# Patient Record
Sex: Male | Born: 1935
Health system: Southern US, Community
[De-identification: ages and names within clinical notes are randomized; demographics above are authoritative.]

## PROBLEM LIST (undated history)

## (undated) DIAGNOSIS — I10 Essential (primary) hypertension: Secondary | ICD-10-CM

## (undated) DIAGNOSIS — K219 Gastro-esophageal reflux disease without esophagitis: Secondary | ICD-10-CM

## (undated) DIAGNOSIS — R946 Abnormal results of thyroid function studies: Secondary | ICD-10-CM

## (undated) DIAGNOSIS — J301 Allergic rhinitis due to pollen: Secondary | ICD-10-CM

## (undated) DIAGNOSIS — R609 Edema, unspecified: Secondary | ICD-10-CM

## (undated) DIAGNOSIS — K8681 Exocrine pancreatic insufficiency: Secondary | ICD-10-CM

## (undated) DIAGNOSIS — Z86010 Personal history of colonic polyps: Secondary | ICD-10-CM

## (undated) DIAGNOSIS — C189 Malignant neoplasm of colon, unspecified: Secondary | ICD-10-CM

## (undated) DIAGNOSIS — K8020 Calculus of gallbladder without cholecystitis without obstruction: Secondary | ICD-10-CM

## (undated) DIAGNOSIS — J309 Allergic rhinitis, unspecified: Secondary | ICD-10-CM

## (undated) DIAGNOSIS — M47817 Spondylosis without myelopathy or radiculopathy, lumbosacral region: Secondary | ICD-10-CM

## (undated) DIAGNOSIS — E119 Type 2 diabetes mellitus without complications: Secondary | ICD-10-CM

## (undated) HISTORY — PX: LEG SURGERY: SHX1003

## (undated) HISTORY — PX: CHOLECYSTECTOMY: SHX55

## (undated) HISTORY — DX: Type 2 diabetes mellitus without complications: E11.9

## (undated) HISTORY — DX: Spondylosis without myelopathy or radiculopathy, lumbosacral region: M47.817

## (undated) HISTORY — DX: Allergic rhinitis, unspecified: J30.9

## (undated) HISTORY — DX: Malignant neoplasm of colon, unspecified: C18.9

## (undated) HISTORY — DX: Exocrine pancreatic insufficiency: K86.81

## (undated) HISTORY — DX: Essential (primary) hypertension: I10

## (undated) HISTORY — DX: Gastro-esophageal reflux disease without esophagitis: K21.9

## (undated) HISTORY — DX: Allergic rhinitis due to pollen: J30.1

## (undated) HISTORY — DX: Calculus of gallbladder without cholecystitis without obstruction: K80.20

## (undated) HISTORY — PX: COLON SURGERY: SHX602

## (undated) HISTORY — DX: Abnormal results of thyroid function studies: R94.6

## (undated) HISTORY — PX: ANKLE SURGERY: SHX546

## (undated) HISTORY — PX: CATARACT EXTRACTION: SUR2

## (undated) HISTORY — DX: Edema, unspecified: R60.9

## (undated) HISTORY — DX: Personal history of colonic polyps: Z86.010

---

## 2005-08-23 ENCOUNTER — Ambulatory Visit: Payer: Self-pay | Admitting: Internal Medicine

## 2005-12-07 ENCOUNTER — Ambulatory Visit: Payer: Self-pay | Admitting: Gastroenterology

## 2005-12-19 ENCOUNTER — Ambulatory Visit: Payer: Self-pay | Admitting: Gastroenterology

## 2008-06-25 ENCOUNTER — Encounter: Payer: Self-pay | Admitting: Family Medicine

## 2008-12-01 ENCOUNTER — Ambulatory Visit: Payer: Self-pay | Admitting: Family Medicine

## 2008-12-01 DIAGNOSIS — I10 Essential (primary) hypertension: Secondary | ICD-10-CM

## 2008-12-01 DIAGNOSIS — Z860100 Personal history of colon polyps, unspecified: Secondary | ICD-10-CM | POA: Insufficient documentation

## 2008-12-01 DIAGNOSIS — K219 Gastro-esophageal reflux disease without esophagitis: Secondary | ICD-10-CM

## 2008-12-01 DIAGNOSIS — E119 Type 2 diabetes mellitus without complications: Secondary | ICD-10-CM

## 2008-12-01 DIAGNOSIS — J309 Allergic rhinitis, unspecified: Secondary | ICD-10-CM

## 2008-12-01 DIAGNOSIS — M47817 Spondylosis without myelopathy or radiculopathy, lumbosacral region: Secondary | ICD-10-CM

## 2008-12-01 HISTORY — DX: Allergic rhinitis, unspecified: J30.9

## 2008-12-01 HISTORY — DX: Spondylosis without myelopathy or radiculopathy, lumbosacral region: M47.817

## 2008-12-01 HISTORY — DX: Essential (primary) hypertension: I10

## 2008-12-01 HISTORY — DX: Gastro-esophageal reflux disease without esophagitis: K21.9

## 2008-12-01 HISTORY — DX: Personal history of colon polyps, unspecified: Z86.0100

## 2008-12-01 HISTORY — DX: Personal history of colonic polyps: Z86.010

## 2008-12-01 HISTORY — DX: Type 2 diabetes mellitus without complications: E11.9

## 2008-12-08 ENCOUNTER — Telehealth: Payer: Self-pay | Admitting: Gastroenterology

## 2008-12-08 ENCOUNTER — Telehealth: Payer: Self-pay | Admitting: Family Medicine

## 2009-01-13 ENCOUNTER — Ambulatory Visit: Payer: Self-pay | Admitting: Family Medicine

## 2009-01-15 LAB — CONVERTED CEMR LAB: Hgb A1c MFr Bld: 6.2 % (ref 4.6–6.5)

## 2009-01-25 ENCOUNTER — Ambulatory Visit: Payer: Self-pay | Admitting: Gastroenterology

## 2009-01-28 LAB — CONVERTED CEMR LAB
ALT: 26 units/L (ref 0–53)
Basophils Absolute: 0 10*3/uL (ref 0.0–0.1)
CO2: 30 meq/L (ref 19–32)
Calcium: 8.8 mg/dL (ref 8.4–10.5)
Chloride: 112 meq/L (ref 96–112)
Eosinophils Relative: 3.2 % (ref 0.0–5.0)
GFR calc non Af Amer: 51.57 mL/min (ref >60)
Hemoglobin: 12.9 g/dL — ABNORMAL LOW (ref 13.0–17.0)
Lymphocytes Relative: 26 % (ref 12.0–46.0)
Monocytes Relative: 10.2 % (ref 3.0–12.0)
Neutro Abs: 4.1 10*3/uL (ref 1.4–7.7)
Platelets: 177 10*3/uL (ref 150.0–400.0)
RDW: 14.1 % (ref 11.5–14.6)
Sodium: 145 meq/L (ref 135–145)
Total Protein: 6.8 g/dL (ref 6.0–8.3)
WBC: 6.8 10*3/uL (ref 4.5–10.5)

## 2009-02-02 ENCOUNTER — Ambulatory Visit: Payer: Self-pay | Admitting: Gastroenterology

## 2009-02-02 LAB — CONVERTED CEMR LAB
Fecal Occult Blood: NEGATIVE
OCCULT 5: NEGATIVE

## 2009-02-03 LAB — CONVERTED CEMR LAB
OCCULT 1: NEGATIVE
OCCULT 2: NEGATIVE
OCCULT 3: NEGATIVE

## 2009-02-25 LAB — HM COLONOSCOPY

## 2009-02-26 ENCOUNTER — Ambulatory Visit: Payer: Self-pay | Admitting: Gastroenterology

## 2009-02-26 DIAGNOSIS — R197 Diarrhea, unspecified: Secondary | ICD-10-CM

## 2009-03-01 ENCOUNTER — Encounter: Payer: Self-pay | Admitting: Gastroenterology

## 2009-03-02 ENCOUNTER — Telehealth (INDEPENDENT_AMBULATORY_CARE_PROVIDER_SITE_OTHER): Payer: Self-pay | Admitting: *Deleted

## 2009-03-05 ENCOUNTER — Ambulatory Visit: Payer: Self-pay | Admitting: Family Medicine

## 2009-03-09 ENCOUNTER — Ambulatory Visit: Payer: Self-pay | Admitting: Gastroenterology

## 2009-03-09 ENCOUNTER — Encounter: Payer: Self-pay | Admitting: Gastroenterology

## 2009-03-10 ENCOUNTER — Encounter: Payer: Self-pay | Admitting: Gastroenterology

## 2009-03-15 ENCOUNTER — Telehealth: Payer: Self-pay | Admitting: Family Medicine

## 2009-03-16 ENCOUNTER — Telehealth: Payer: Self-pay | Admitting: Gastroenterology

## 2009-03-24 ENCOUNTER — Telehealth: Payer: Self-pay | Admitting: Gastroenterology

## 2009-04-01 ENCOUNTER — Telehealth (INDEPENDENT_AMBULATORY_CARE_PROVIDER_SITE_OTHER): Payer: Self-pay | Admitting: *Deleted

## 2009-04-02 ENCOUNTER — Ambulatory Visit: Payer: Self-pay | Admitting: Family Medicine

## 2009-04-02 ENCOUNTER — Ambulatory Visit: Payer: Self-pay | Admitting: Gastroenterology

## 2009-04-06 ENCOUNTER — Encounter: Payer: Self-pay | Admitting: Family Medicine

## 2009-04-07 ENCOUNTER — Encounter: Admission: RE | Admit: 2009-04-07 | Discharge: 2009-04-07 | Payer: Self-pay | Admitting: Family Medicine

## 2009-04-07 ENCOUNTER — Ambulatory Visit: Payer: Self-pay | Admitting: Family Medicine

## 2009-04-07 DIAGNOSIS — K8020 Calculus of gallbladder without cholecystitis without obstruction: Secondary | ICD-10-CM

## 2009-04-07 DIAGNOSIS — R1011 Right upper quadrant pain: Secondary | ICD-10-CM | POA: Insufficient documentation

## 2009-04-07 HISTORY — DX: Calculus of gallbladder without cholecystitis without obstruction: K80.20

## 2009-04-07 LAB — CONVERTED CEMR LAB
Albumin: 3.2 g/dL — ABNORMAL LOW (ref 3.5–5.2)
Alkaline Phosphatase: 108 units/L (ref 39–117)
Basophils Relative: 0.1 % (ref 0.0–3.0)
Eosinophils Absolute: 0 10*3/uL (ref 0.0–0.7)
Hemoglobin: 12.3 g/dL — ABNORMAL LOW (ref 13.0–17.0)
Lymphocytes Relative: 10.4 % — ABNORMAL LOW (ref 12.0–46.0)
MCHC: 35 g/dL (ref 30.0–36.0)
MCV: 94.2 fL (ref 78.0–100.0)
Neutro Abs: 7.1 10*3/uL (ref 1.4–7.7)
RBC: 3.75 M/uL — ABNORMAL LOW (ref 4.22–5.81)
Total Protein: 7.3 g/dL (ref 6.0–8.3)

## 2009-04-08 ENCOUNTER — Telehealth: Payer: Self-pay | Admitting: Family Medicine

## 2009-04-14 ENCOUNTER — Telehealth (INDEPENDENT_AMBULATORY_CARE_PROVIDER_SITE_OTHER): Payer: Self-pay | Admitting: *Deleted

## 2009-04-16 ENCOUNTER — Telehealth: Payer: Self-pay | Admitting: Gastroenterology

## 2009-04-29 ENCOUNTER — Encounter (INDEPENDENT_AMBULATORY_CARE_PROVIDER_SITE_OTHER): Payer: Self-pay | Admitting: Surgery

## 2009-04-29 ENCOUNTER — Ambulatory Visit (HOSPITAL_COMMUNITY): Admission: RE | Admit: 2009-04-29 | Discharge: 2009-04-30 | Payer: Self-pay | Admitting: Surgery

## 2009-05-04 ENCOUNTER — Ambulatory Visit: Payer: Self-pay | Admitting: Family Medicine

## 2009-05-04 DIAGNOSIS — R609 Edema, unspecified: Secondary | ICD-10-CM

## 2009-05-04 HISTORY — DX: Edema, unspecified: R60.9

## 2009-05-07 ENCOUNTER — Encounter (INDEPENDENT_AMBULATORY_CARE_PROVIDER_SITE_OTHER): Payer: Self-pay | Admitting: Surgery

## 2009-05-07 ENCOUNTER — Ambulatory Visit: Admission: RE | Admit: 2009-05-07 | Discharge: 2009-05-07 | Payer: Self-pay | Admitting: Surgery

## 2009-05-07 ENCOUNTER — Ambulatory Visit: Payer: Self-pay | Admitting: Vascular Surgery

## 2009-06-22 ENCOUNTER — Ambulatory Visit: Payer: Self-pay | Admitting: Family Medicine

## 2009-06-23 ENCOUNTER — Telehealth (INDEPENDENT_AMBULATORY_CARE_PROVIDER_SITE_OTHER): Payer: Self-pay | Admitting: *Deleted

## 2009-06-23 LAB — CONVERTED CEMR LAB
BUN: 21 mg/dL (ref 6–23)
Calcium: 8.4 mg/dL (ref 8.4–10.5)
Creatinine, Ser: 1.4 mg/dL (ref 0.4–1.5)
GFR calc non Af Amer: 51.52 mL/min (ref >60)
Potassium: 4.5 meq/L (ref 3.5–5.1)

## 2009-06-25 ENCOUNTER — Encounter: Payer: Self-pay | Admitting: Family Medicine

## 2009-06-30 ENCOUNTER — Encounter: Admission: RE | Admit: 2009-06-30 | Discharge: 2009-06-30 | Payer: Self-pay | Admitting: Gastroenterology

## 2009-07-22 ENCOUNTER — Encounter (INDEPENDENT_AMBULATORY_CARE_PROVIDER_SITE_OTHER): Payer: Self-pay | Admitting: *Deleted

## 2009-10-19 ENCOUNTER — Encounter: Payer: Self-pay | Admitting: Family Medicine

## 2009-10-27 ENCOUNTER — Ambulatory Visit: Payer: Self-pay | Admitting: Family Medicine

## 2009-10-28 LAB — CONVERTED CEMR LAB
ALT: 17 units/L (ref 0–53)
AST: 21 units/L (ref 0–37)
Alkaline Phosphatase: 63 units/L (ref 39–117)
Bilirubin, Direct: 0.2 mg/dL (ref 0.0–0.3)
Cholesterol: 128 mg/dL (ref 0–200)
Total Protein: 7.3 g/dL (ref 6.0–8.3)
VLDL: 14.8 mg/dL (ref 0.0–40.0)

## 2009-12-23 ENCOUNTER — Encounter: Payer: Self-pay | Admitting: Family Medicine

## 2010-01-17 ENCOUNTER — Ambulatory Visit: Payer: Self-pay | Admitting: Family Medicine

## 2010-01-17 DIAGNOSIS — R946 Abnormal results of thyroid function studies: Secondary | ICD-10-CM | POA: Insufficient documentation

## 2010-01-17 DIAGNOSIS — R21 Rash and other nonspecific skin eruption: Secondary | ICD-10-CM

## 2010-01-17 HISTORY — DX: Abnormal results of thyroid function studies: R94.6

## 2010-01-27 ENCOUNTER — Telehealth: Payer: Self-pay | Admitting: Family Medicine

## 2010-04-13 ENCOUNTER — Ambulatory Visit: Payer: Self-pay | Admitting: Family Medicine

## 2010-04-14 LAB — CONVERTED CEMR LAB: Hgb A1c MFr Bld: 5.9 % (ref 4.6–6.5)

## 2010-06-12 ENCOUNTER — Encounter: Payer: Self-pay | Admitting: Family Medicine

## 2010-07-13 ENCOUNTER — Ambulatory Visit: Payer: Self-pay | Admitting: Family Medicine

## 2010-07-18 ENCOUNTER — Telehealth: Payer: Self-pay | Admitting: *Deleted

## 2010-07-20 LAB — CONVERTED CEMR LAB: Hgb A1c MFr Bld: 7 % — ABNORMAL HIGH (ref 4.6–6.5)

## 2010-09-19 LAB — HM DIABETES FOOT EXAM: HM Diabetic Foot Exam: NORMAL

## 2010-09-29 ENCOUNTER — Other Ambulatory Visit: Payer: Self-pay | Admitting: Family Medicine

## 2010-09-29 ENCOUNTER — Ambulatory Visit
Admission: RE | Admit: 2010-09-29 | Discharge: 2010-09-29 | Payer: Self-pay | Source: Home / Self Care | Attending: Family Medicine | Admitting: Family Medicine

## 2010-09-29 LAB — LIPID PANEL
Cholesterol: 132 mg/dL (ref 0–200)
HDL: 29.9 mg/dL — ABNORMAL LOW (ref >39.00)
LDL Cholesterol: 84 mg/dL (ref 0–99)
Total CHOL/HDL Ratio: 4
Triglycerides: 91 mg/dL (ref 0.0–149.0)
VLDL: 18.2 mg/dL (ref 0.0–40.0)

## 2010-09-29 LAB — HEMOGLOBIN A1C: Hgb A1c MFr Bld: 6.6 % — ABNORMAL HIGH (ref 4.6–6.5)

## 2010-10-11 ENCOUNTER — Telehealth: Payer: Self-pay | Admitting: Family Medicine

## 2010-10-18 NOTE — Assessment & Plan Note (Signed)
Summary: 3 mo rov/mm/pt rescd//ccm PT RSC/NJR   Vital Signs:  Patient profile:   77 year old male Weight:      229 pounds Temp:     98.6 degrees F oral BP sitting:   122 / 78  (left arm) Cuff size:   large  Vitals Entered By: Sid Falcon LPN (April 13, 2010 11:28 AM) CC: 3 month visit, Hypertension Management Is Patient Diabetic? Yes Did you bring your meter with you today? No   History of Present Illness: Patient is seen for followup diabetes and hypothyroidism.  Refer to prior note. Actually had labs through [facility] with thyroid upper limit of normal. He denies any fatigue issues. Has had some weight gain.  Type 2 diabetes which has been very well controlled. We have considered whether he may be able to discontinue Januvia.   Needs repeat A1c today. Compliant with other medications. No hypoglycemia. Not exercising regularly. Has scaled back carbohydrate intake.  Weight gain since going on pancreatic enz replacement.  Prior to that had had months of diarrhea.  No diarrhea at this time.  Hypertension History:      He denies headache, chest pain, palpitations, dyspnea with exertion, orthopnea, PND, peripheral edema, visual symptoms, neurologic problems, syncope, and side effects from treatment.        Positive major cardiovascular risk factors include male age 35 years old or older, diabetes, and hypertension.  Negative major cardiovascular risk factors include non-tobacco-user status.     Allergies: 1)  ! Amoxicillin (Amoxicillin)  Past History:  Past Medical History: Last updated: 01/25/2009 Allergies, Hay fever diabetes mellitus, type II GERD Hypertension Colon cancer 1990s, treated with segmental resection and adjuvant chemotherapy  Past Surgical History: Last updated: 01/25/2009 segmental colon resection for colon cancer 1990s  Family History: Last updated: 01/25/2009 Family History of Colon CA 1st degree relative <60 Family History Diabetes 1st degree  relative Stroke  Social History: Last updated: 01/25/2009 Occupation:  Midwife Single Alcohol use-no Former Smoker 2 sons  Risk Factors: Exercise: no (01/17/2010)  Risk Factors: Smoking Status: quit (12/01/2008) PMH-FH-SH reviewed for relevance  Review of Systems      See HPI  Physical Exam  General:  Well-developed,well-nourished,in no acute distress; alert,appropriate and cooperative throughout examination Ears:  External ear exam shows no significant lesions or deformities.  Otoscopic examination reveals clear canals, tympanic membranes are intact bilaterally without bulging, retraction, inflammation or discharge. Hearing is grossly normal bilaterally. Mouth:  Oral mucosa and oropharynx without lesions or exudates.  Teeth in good repair. Neck:  No deformities, masses, or tenderness noted. Lungs:  Normal respiratory effort, chest expands symmetrically. Lungs are clear to auscultation, no crackles or wheezes. Heart:  Normal rate and regular rhythm. S1 and S2 normal without gallop, murmur, click, rub or other extra sounds. Extremities:  no pitting edema. No lesions Neurologic:  alert & oriented X3 and cranial nerves II-XII intact.   Skin:  no rashes and no suspicious lesions.     Impression & Recommendations:  Problem # 1:  ABNORMAL THYROID FUNCTION TESTS (ICD-794.5) repeat TSH Orders: TLB-TSH (Thyroid Stimulating Hormone) (84443-TSH) Venipuncture (16109) Specimen Handling (60454)  Problem # 2:  DIABETES MELLITUS, TYPE II (ICD-250.00) has been controlled.  repeat A1C and consider d/c Januvia if excellent control His updated medication list for this problem includes:    Benazepril Hcl 20 Mg Tabs (Benazepril hcl) ..... Once daily    Januvia 100 Mg Tabs (Sitagliptin phosphate) ..... Once daily    Actos 30  Mg Tabs (Pioglitazone hcl) ..... One half tablet daily.    Glyburide 2.5 Mg Tabs (Glyburide) ..... One by mouth once daily  Orders: TLB-A1C / Hgb A1C  (Glycohemoglobin) (83036-A1C) Venipuncture (62952) Specimen Handling (84132)  Problem # 3:  ESSENTIAL HYPERTENSION (ICD-401.9)  His updated medication list for this problem includes:    Benazepril Hcl 20 Mg Tabs (Benazepril hcl) ..... Once daily    Amlodipine Besylate 5 Mg Tabs (Amlodipine besylate) ..... Once daily    Hydrochlorothiazide 25 Mg Tabs (Hydrochlorothiazide) .Marland Kitchen... 1/2 tablet by mouth once daily  Complete Medication List: 1)  Benazepril Hcl 20 Mg Tabs (Benazepril hcl) .... Once daily 2)  Amlodipine Besylate 5 Mg Tabs (Amlodipine besylate) .... Once daily 3)  Januvia 100 Mg Tabs (Sitagliptin phosphate) .... Once daily 4)  Actos 30 Mg Tabs (Pioglitazone hcl) .... One half tablet daily. 5)  Celebrex 200 Mg Caps (Celecoxib) .... As needed 6)  Hydrochlorothiazide 25 Mg Tabs (Hydrochlorothiazide) .... 1/2 tablet by mouth once daily 7)  Creon 12000 Unit Cpep (Pancrelipase (lip-prot-amyl)) .... One tab before meals three times a day 8)  Glyburide 2.5 Mg Tabs (Glyburide) .... One by mouth once daily  Hypertension Assessment/Plan:      The patient's hypertensive risk group is category C: Target organ damage and/or diabetes.  His calculated 10 year risk of coronary heart disease is 18 %.  Today's blood pressure is 122/78.  His blood pressure goal is < 130/80.  Patient Instructions: 1)  It is important that you exercise reguarly at least 20 minutes 5 times a week. If you develop chest pain, have severe difficulty breathing, or feel very tired, stop exercising immediately and seek medical attention.  2)  You need to lose weight. Consider a lower calorie diet and regular exercise.  3)  Check your blood sugars regularly. If your readings are usually above:  or below 70 you should contact our office.  4)  It is important that your diabetic A1c level is checked every 3 months.  5)  See your eye doctor yearly to check for diabetic eye damage. 6)  Check your feet each night  for sore areas,  calluses or signs of infection.  7)  Please schedule a follow-up appointment in 3 months .  Prescriptions: JANUVIA 100 MG TABS (SITAGLIPTIN PHOSPHATE) once daily  #90 x 3   Entered and Authorized by:   Evelena Peat MD   Signed by:   Evelena Peat MD on 04/13/2010   Method used:   Electronically to        [facility]* (retail)             ,          Ph: [phone]       Fax: [phone]   RxID:   [id number] ACTOS 30 MG TABS (PIOGLITAZONE HCL) one half tablet daily.  #90 x 3   Entered and Authorized by:   Evelena Peat MD   Signed by:   Evelena Peat MD on 04/13/2010   Method used:   Electronically to        [facility]* (retail)             ,          Ph: [phone]       Fax: [phone]   RxID:   [id number]

## 2010-10-18 NOTE — Assessment & Plan Note (Signed)
Summary: 3 MONTH ROV/NJR   Vital Signs:  Patient profile:   77 year old male Weight:      230 pounds Temp:     98.0 degrees F oral BP sitting:   140 / 78  (left arm) Cuff size:   large  Vitals Entered By: Sid Falcon LPN (July 13, 2010 10:02 AM)  Serial Vital Signs/Assessments:  Time      Position  BP       Pulse  Resp  Temp     By                     138/68                         Evelena Peat MD   History of Present Illness: Patient seen for the following medical problems.  History of frequent allergic symptoms. Mostly clear nasal discharge. Frequent sneezing. Has tried  antihistamine such as Zyrtec in the past without relief. Is inquiring about possible allergist referral. Also has tried Flonase. Does not recall nasal antihistamines.  Patient presents with one-week history of cough. Body aches and nasal congestion. Went to urgent care Center last week and started Zithromax. No definite fever this time.  No flu vaccine yet.  Type 2 diabetes. Currently takes Actos 15 mg daily and glyburide 2.5 mg daily.  last A1c 5.9% and Januvia discontinued. Recent fasting blood sugar 140. No symptoms of hyperglycemia  Diabetes Management History:      The patient is an 77 years old male who comes in for evaluation of Type 2 Diabetes Mellitus.  He has not been enrolled in the "Diabetic Education Program".  He states understanding of dietary principles but he is not following the appropriate diet.  No sensory loss is reported.  Self foot exams are being performed.  He is checking home blood sugars.  He says that he is not exercising regularly.        Hypoglycemic symptoms are not occurring.  No hyperglycemic symptoms are reported.        There are no symptoms to suggest diabetic complications.  The following changes have been made to his treatment plan since last visit: medication changes.    Allergies: 1)  ! Amoxicillin (Amoxicillin)  Past History:  Past Medical History: Last  updated: 01/25/2009 Allergies, Hay fever diabetes mellitus, type II GERD Hypertension Colon cancer 1990s, treated with segmental resection and adjuvant chemotherapy  Past Surgical History: Last updated: 01/25/2009 segmental colon resection for colon cancer 1990s  Family History: Last updated: 01/25/2009 Family History of Colon CA 1st degree relative <60 Family History Diabetes 1st degree relative Stroke  Social History: Last updated: 01/25/2009 Occupation:  Midwife Single Alcohol use-no Former Smoker 2 sons  Risk Factors: Exercise: no (01/17/2010)  Risk Factors: Smoking Status: quit (12/01/2008) PMH-FH-SH reviewed for relevance  Review of Systems      See HPI  Physical Exam  General:  Well-developed,well-nourished,in no acute distress; alert,appropriate and cooperative throughout examination Eyes:  pupils equal, pupils round, and pupils reactive to light.   Ears:  cerumen in both canals Nose:  clear mucous bilaterally Mouth:  Oral mucosa and oropharynx without lesions or exudates.  Teeth in good repair. Neck:  No deformities, masses, or tenderness noted. Lungs:  Normal respiratory effort, chest expands symmetrically. Lungs are clear to auscultation, no crackles or wheezes. Heart:  normal rate and regular rhythm.   Extremities:  No clubbing, cyanosis, edema,  or deformity noted with normal full range of motion of all joints.    Diabetes Management Exam:    Foot Exam (with socks and/or shoes not present):       Sensory-Pinprick/Light touch:          Left medial foot (L-4): normal          Left dorsal foot (L-5): normal          Left lateral foot (S-1): normal          Right medial foot (L-4): normal          Right dorsal foot (L-5): normal          Right lateral foot (S-1): normal       Sensory-Monofilament:          Left foot: normal          Right foot: normal       Inspection:          Left foot: normal          Right foot: normal       Nails:           Left foot: normal          Right foot: normal    Eye Exam:       Eye Exam done elsewhere          Date: 07/21/2009          Results: normal          Done by: [facility]   Impression & Recommendations:  Problem # 1:  ALLERGIC RHINITIS (ICD-477.9) Assessment Deteriorated trial of Astelin His updated medication list for this problem includes:    Astelin 137 Mcg/spray Soln (Azelastine hcl) .Marland Kitchen... 1-2 sprays per nostril two times a day as needed  Problem # 2:  VIRAL URI (ICD-465.9) Assessment: New  His updated medication list for this problem includes:    Celebrex 200 Mg Caps (Celecoxib) .Marland Kitchen... As needed  Problem # 3:  DIABETES MELLITUS, TYPE II (ICD-250.00) repeat A1C.  Schedule eye exam. His updated medication list for this problem includes:    Benazepril Hcl 20 Mg Tabs (Benazepril hcl) ..... Once daily    Januvia 100 Mg Tabs (Sitagliptin phosphate) ..... Once daily    Actos 30 Mg Tabs (Pioglitazone hcl) ..... One half tablet daily.    Glyburide 2.5 Mg Tabs (Glyburide) ..... One by mouth once daily  Orders: Specimen Handling (54098) Venipuncture (11914) TLB-A1C / Hgb A1C (Glycohemoglobin) (83036-A1C)  Problem # 4:  ESSENTIAL HYPERTENSION (ICD-401.9)  His updated medication list for this problem includes:    Benazepril Hcl 20 Mg Tabs (Benazepril hcl) ..... Once daily    Amlodipine Besylate 5 Mg Tabs (Amlodipine besylate) ..... Once daily    Hydrochlorothiazide 25 Mg Tabs (Hydrochlorothiazide) .Marland Kitchen... 1/2 tablet by mouth once daily  Complete Medication List: 1)  Benazepril Hcl 20 Mg Tabs (Benazepril hcl) .... Once daily 2)  Amlodipine Besylate 5 Mg Tabs (Amlodipine besylate) .... Once daily 3)  Januvia 100 Mg Tabs (Sitagliptin phosphate) .... Once daily 4)  Actos 30 Mg Tabs (Pioglitazone hcl) .... One half tablet daily. 5)  Celebrex 200 Mg Caps (Celecoxib) .... As needed 6)  Hydrochlorothiazide 25 Mg Tabs (Hydrochlorothiazide) .... 1/2 tablet by mouth once daily 7)   Creon 12000 Unit Cpep (Pancrelipase (lip-prot-amyl)) .... One tab before meals three times a day 8)  Glyburide 2.5 Mg Tabs (Glyburide) .... One by mouth once daily 9)  Astelin  137 Mcg/spray Soln (Azelastine hcl) .Marland Kitchen.. 1-2 sprays per nostril two times a day as needed  Other Orders: Admin 1st Vaccine (08657) Flu Vaccine 73yrs + (84696)  Diabetes Management Assessment/Plan:      His blood pressure goal is < 130/80.    Patient Instructions: 1)  Please schedule a follow-up appointment in 3 months .  2)  Limit your Sodium(salt) .  3)  Check your  Blood Pressure regularly . If it is above: 140/90  you should make an appointment. 4)  See your eye doctor yearly to check for diabetic eye damage. Prescriptions: ASTELIN 137 MCG/SPRAY SOLN (AZELASTINE HCL) 1-2 sprays per nostril two times a day as needed  #1 x 3   Entered and Authorized by:   Evelena Peat MD   Signed by:   Evelena Peat MD on 07/13/2010   Method used:   Electronically to        [facility]. [phone]* (retail)       [phone]       [place], [place]  [address]       Ph: [phone]       Fax: [phone]   RxID:   [id number]    Orders Added: 1)  Admin 1st Vaccine [90471] 2)  Flu Vaccine 21yrs + [60630] 3)  Specimen Handling [99000] 4)  Venipuncture [36415] 5)  TLB-A1C / Hgb A1C (Glycohemoglobin) [83036-A1C] 6)  Est. Patient Level IV [16010]   Flu Vaccine Consent Questions     Do you have a history of severe allergic reactions to this vaccine? no    Any prior history of allergic reactions to egg and/or gelatin? no    Do you have a sensitivity to the preservative Thimersol? no    Do you have a past history of Guillan-Barre Syndrome? no    Do you currently have an acute febrile illness? no    Have you ever had a severe reaction to latex? no    Vaccine information given and explained to patient? yes    Are you currently pregnant? no    Lot Number:[id number]   Exp  Date:03/18/2011   Site Given  Left Deltoid IM .lbflu1

## 2010-10-18 NOTE — Progress Notes (Signed)
Summary: no answer no machine 10-31, A1C   Phone Note Call from Patient Call back at Home Phone [phone]   Caller: vm Reason for Call: Lab or Test Results Summary of Call: A1C Initial call taken by: Rudy Jew, RN,  July 18, 2010 12:35 PM  Follow-up for Phone Call        No answer.  No message machine.   Follow-up by: Rudy Jew, RN,  July 18, 2010 2:16 PM  Additional Follow-up for Phone Call Additional follow up Details #1::        Pt informed, he is not happy with A1C at 7.  He would like to go back on Januvia.  Per Dr Caryl Never, he would prefer he work on controling his weight and diet, however may go back on [place] if he wishes.  Pt informed, he will work on weight loss, diet Additional Follow-up by: Sid Falcon LPN,  July 20, 2010 10:41 AM    Additional Follow-up for Phone Call Additional follow up Details #2::    We can go back on Januvia but my prefence is that he focus on dietary compliance and weight loss as first priority.  A1C of 7 is considered adequate control by ADA guidelines. Follow-up by: Evelena Peat MD,  July 20, 2010 10:45 AM

## 2010-10-18 NOTE — Assessment & Plan Note (Signed)
Summary: FU ON DM/NJR   Vital Signs:  Patient profile:   77 year old male Weight:      229 pounds Temp:     98.5 degrees F oral BP sitting:   130 / 70  (left arm) Cuff size:   large  Vitals Entered By: Sid Falcon LPN (October 27, 2009 10:09 AM) CC: 3 month DM follow-up, Hypertension Management Is Patient Diabetic? Yes Did you bring your meter with you today? No   History of Present Illness: Followup medical problems. History of type 2 diabetes which has been well controlled home readings. Most fasting blood sugars less than 100. No hypoglycemia. Evening blood sugars 100-140. Gets eye exams through the [facility]. Last eye exam last October.  Patient had some chronic diarrhea and extensive workup. Eventually placed on Creon and symptoms have all but resolved. Has had significant weight gain and feels much better overall. Also taking vitamin D supplement he thinks that may correlate with feeling better.  Hypertension History:      He denies headache, chest pain, palpitations, dyspnea with exertion, orthopnea, PND, visual symptoms, neurologic problems, syncope, and side effects from treatment.  He notes no problems with any antihypertensive medication side effects.  Further comments include: mild peripheral edema which is unchanged.        Positive major cardiovascular risk factors include male age 46 years old or older, diabetes, and hypertension.  Negative major cardiovascular risk factors include non-tobacco-user status.      Allergies: 1)  ! Amoxicillin (Amoxicillin)  Past History:  Past Medical History: Last updated: 01/25/2009 Allergies, Hay fever diabetes mellitus, type II GERD Hypertension Colon cancer 1990s, treated with segmental resection and adjuvant chemotherapy  Past Surgical History: Last updated: 01/25/2009 segmental colon resection for colon cancer 1990s  Social History: Last updated: 01/25/2009 Occupation:  Midwife Single Alcohol  use-no Former Smoker 2 sons PMH-FH-SH reviewed for relevance  Review of Systems       The patient complains of weight gain.  The patient denies anorexia, fever, weight loss, vision loss, chest pain, syncope, dyspnea on exertion, prolonged cough, headaches, hemoptysis, abdominal pain, melena, hematochezia, severe indigestion/heartburn, and incontinence.    Physical Exam  General:  Well-developed,well-nourished,in no acute distress; alert,appropriate and cooperative throughout examination Eyes:  pupils equal, pupils round, and pupils reactive to light.   Ears:  External ear exam shows no significant lesions or deformities.  Otoscopic examination reveals clear canals, tympanic membranes are intact bilaterally without bulging, retraction, inflammation or discharge. Hearing is grossly normal bilaterally. Mouth:  Oral mucosa and oropharynx without lesions or exudates.  Teeth in good repair. Neck:  No deformities, masses, or tenderness noted. Lungs:  Normal respiratory effort, chest expands symmetrically. Lungs are clear to auscultation, no crackles or wheezes. Heart:  normal rate and regular rhythm.   Extremities:  trace peripheral edema nonpitting lower legs  Diabetes Management Exam:    Foot Exam (with socks and/or shoes not present):       Sensory-Pinprick/Light touch:          Left medial foot (L-4): normal          Left dorsal foot (L-5): normal          Left lateral foot (S-1): normal          Right medial foot (L-4): normal          Right dorsal foot (L-5): normal          Right lateral foot (S-1): normal  Sensory-Monofilament:          Left foot: normal          Right foot: normal       Inspection:          Left foot: normal          Right foot: normal       Nails:          Left foot: fungal infection          Right foot: fungal infection    Eye Exam:       Eye Exam done elsewhere          Date: 06/18/2009          Results: normal          [facility]   Impression & Recommendations:  Problem # 1:  DIABETES MELLITUS, TYPE II (ICD-250.00) good control by home readings His updated medication list for this problem includes:    Benazepril Hcl 20 Mg Tabs (Benazepril hcl) ..... Once daily    Januvia 100 Mg Tabs (Sitagliptin phosphate) ..... Once daily    Actos 30 Mg Tabs (Pioglitazone hcl) ..... One half tablet daily.    Glyburide 2.5 Mg Tabs (Glyburide) ..... One by mouth once daily  Orders: Venipuncture (60454) TLB-Lipid Panel (80061-LIPID) TLB-Hepatic/Liver Function Pnl (80076-HEPATIC) TLB-A1C / Hgb A1C (Glycohemoglobin) (83036-A1C) TLB-Microalbumin/Creat Ratio, Urine (82043-MALB)  Problem # 2:  ESSENTIAL HYPERTENSION (ICD-401.9)  His updated medication list for this problem includes:    Benazepril Hcl 20 Mg Tabs (Benazepril hcl) ..... Once daily    Amlodipine Besylate 5 Mg Tabs (Amlodipine besylate) ..... Once daily    Hydrochlorothiazide 25 Mg Tabs (Hydrochlorothiazide) .Marland Kitchen... 1/2 tablet by mouth once daily  Problem # 3:  DIARRHEA (ICD-787.91) Assessment: Improved  Complete Medication List: 1)  Benazepril Hcl 20 Mg Tabs (Benazepril hcl) .... Once daily 2)  Amlodipine Besylate 5 Mg Tabs (Amlodipine besylate) .... Once daily 3)  Januvia 100 Mg Tabs (Sitagliptin phosphate) .... Once daily 4)  Actos 30 Mg Tabs (Pioglitazone hcl) .... One half tablet daily. 5)  Celebrex 200 Mg Caps (Celecoxib) .... As needed 6)  Hydrochlorothiazide 25 Mg Tabs (Hydrochlorothiazide) .... 1/2 tablet by mouth once daily 7)  Triamcinolone Acetonide 0.1 % Crea (Triamcinolone acetonide) .... Apply to affected rash two times a day as needed 8)  Creon 12000 Unit Cpep (Pancrelipase (lip-prot-amyl)) .... One tab before meals three times a day 9)  Glyburide 2.5 Mg Tabs (Glyburide) .... One by mouth once daily  Diabetes Management Assessment/Plan:      His blood pressure goal is < 130/80.    Hypertension Assessment/Plan:      The patient's hypertensive  risk group is category C: Target organ damage and/or diabetes.  Today's blood pressure is 130/70.  His blood pressure goal is < 130/80.  Patient Instructions: 1)  Please schedule a follow-up appointment in 4 months .  2)  It is important that you exercise reguarly at least 20 minutes 5 times a week. If you develop chest pain, have severe difficulty breathing, or feel very tired, stop exercising immediately and seek medical attention.  3)  You need to lose weight. Consider a lower calorie diet and regular exercise.  4)  Check your blood sugars regularly. If your readings are usually above: 140 or below 70 you should contact our office.  5)  It is important that your diabetic A1c level is checked every 3 months.  6)  See your eye doctor yearly to check for diabetic eye damage. 7)  Check your feet each night  for sore areas, calluses or signs of infection.  8)  Start baby Apirin 81 mg one daily.

## 2010-10-18 NOTE — Progress Notes (Signed)
Summary: refill HCTZ 30 days  Phone Note Call from Patient   Caller: Patient Call For: Evelena Peat MD Summary of Call: [facility] Needs 30 days of HCTZ 25 mg. sent to [facility] while he is waiting for his shipment from [place]. Initial call taken by: Lynann Beaver CMA,  Jan 27, 2010 11:25 AM    Prescriptions: HYDROCHLOROTHIAZIDE 25 MG TABS (HYDROCHLOROTHIAZIDE) 1/2 tablet by mouth once daily  #30 x 0   Entered by:   Sid Falcon LPN   Authorized by:   Evelena Peat MD   Signed by:   Sid Falcon LPN on 54/05/8118   Method used:   Telephoned to ...       [facility]. [phone]* (retail)       [phone] [address]       [place], [place]  [address]       Ph: [phone]       Fax: [phone]   RxID:   [id number]

## 2010-10-18 NOTE — Assessment & Plan Note (Signed)
Summary: med check/cjr   Vital Signs:  Patient profile:   77 year old male Weight:      228 pounds Temp:     98.7 degrees F oral BP sitting:   138 / 68  (left arm) Cuff size:   large  Vitals Entered By: Sid Falcon LPN (Jan 17, 1609 10:51 AM) CC: concerns with thyroid   History of Present Illness: Patient here for evaluation multiple items as follows.  Recent visit to the [facility] with TSH 4.99 with upper limit of normal 3.77 by their lab. Patient denies any excessive fatigue. Had some weight gain. No prior history of thyroid problems. Was not started on any medication. No constipation issues.  Type 2 diabetes which has been stable. Fasting blood sugars consistently below 100. No hypoglycemia. No symptoms of hyperglycemia. Eye exam next month   new problem of rash right hand between third and fourth digits.  that is slightly pruritic and occasionally slightly sore. No drainage. Has not used anything topically.  Patient had some chronic diarrhea which is currently stable. Followed by GI and treated with Creon one tablet 3 times daily with meals  Diabetes Management History:      The patient is an 77 years old male who comes in for evaluation of Type 2 Diabetes Mellitus.  He has not been enrolled in the "Diabetic Education Program".  He states understanding of dietary principles and is following his diet appropriately.  No sensory loss is reported.  Self foot exams are being performed.  He is checking home blood sugars.  He says that he is not exercising regularly.        Hypoglycemic symptoms are not occurring.  No hyperglycemic symptoms are reported.    Preventive Screening-Counseling & Management  Caffeine-Diet-Exercise     Does Patient Exercise: no  Allergies: 1)  ! Amoxicillin (Amoxicillin)  Past History:  Past Medical History: Last updated: 01/25/2009 Allergies, Hay fever diabetes mellitus, type II GERD Hypertension Colon cancer 1990s, treated with segmental  resection and adjuvant chemotherapy  Past Surgical History: Last updated: 01/25/2009 segmental colon resection for colon cancer 1990s  Social History: Last updated: 01/25/2009 Occupation:  Midwife Single Alcohol use-no Former Smoker 2 sons  Social History: Does Patient Exercise:  no  Review of Systems  The patient denies anorexia, fever, weight loss, chest pain, syncope, dyspnea on exertion, peripheral edema, prolonged cough, headaches, hemoptysis, abdominal pain, melena, hematochezia, severe indigestion/heartburn, hematuria, incontinence, and muscle weakness.    Physical Exam  General:  Well-developed,well-nourished,in no acute distress; alert,appropriate and cooperative throughout examination Head:  Normocephalic and atraumatic without obvious abnormalities. No apparent alopecia or balding. Mouth:  Oral mucosa and oropharynx without lesions or exudates.  Teeth in good repair. Neck:  No deformities, masses, or tenderness noted. Lungs:  Normal respiratory effort, chest expands symmetrically. Lungs are clear to auscultation, no crackles or wheezes. Heart:  Normal rate and regular rhythm. S1 and S2 normal without gallop, murmur, click, rub or other extra sounds. Extremities:  no edema  Diabetes Management Exam:    Foot Exam (with socks and/or shoes not present):       Sensory-Pinprick/Light touch:          Left medial foot (L-4): normal          Left dorsal foot (L-5): normal          Left lateral foot (S-1): normal          Right medial foot (L-4): normal  Right dorsal foot (L-5): normal          Right lateral foot (S-1): normal       Sensory-Monofilament:          Left foot: normal          Right foot: normal       Inspection:          Left foot: normal          Right foot: normal       Nails:          Left foot: normal          Right foot: normal    Eye Exam:       Eye Exam done elsewhere          Date: 01/18/2009          Results: normal          [facility]   Impression & Recommendations:  Problem # 1:  DIABETES MELLITUS, TYPE II (ICD-250.00) repeat A1C in 3 months and consider D/C Januvia at that time if excellent control. His updated medication list for this problem includes:    Benazepril Hcl 20 Mg Tabs (Benazepril hcl) ..... Once daily    Januvia 100 Mg Tabs (Sitagliptin phosphate) ..... Once daily    Actos 30 Mg Tabs (Pioglitazone hcl) ..... One half tablet daily.    Glyburide 2.5 Mg Tabs (Glyburide) ..... One by mouth once daily  Problem # 2:  ABNORMAL THYROID FUNCTION TESTS (ICD-794.5) repeat TSH in 3 months. ?early hypothyroidism.  Problem # 3:  SKIN RASH (ICD-782.1) interdigital.  ? fungal.  Try OTC Lamisil. The following medications were removed from the medication list:    Triamcinolone Acetonide 0.1 % Crea (Triamcinolone acetonide) .Marland Kitchen... Apply to affected rash two times a day as needed  Complete Medication List: 1)  Benazepril Hcl 20 Mg Tabs (Benazepril hcl) .... Once daily 2)  Amlodipine Besylate 5 Mg Tabs (Amlodipine besylate) .... Once daily 3)  Januvia 100 Mg Tabs (Sitagliptin phosphate) .... Once daily 4)  Actos 30 Mg Tabs (Pioglitazone hcl) .... One half tablet daily. 5)  Celebrex 200 Mg Caps (Celecoxib) .... As needed 6)  Hydrochlorothiazide 25 Mg Tabs (Hydrochlorothiazide) .... 1/2 tablet by mouth once daily 7)  Creon 12000 Unit Cpep (Pancrelipase (lip-prot-amyl)) .... One tab before meals three times a day 8)  Glyburide 2.5 Mg Tabs (Glyburide) .... One by mouth once daily  Diabetes Management Assessment/Plan:      His blood pressure goal is < 130/80.    Patient Instructions: 1)  Please schedule a follow-up appointment in 3 months .  2)  TSH prior to visit ICD-9 : 244.9 3)  HgBA1c prior to visit  ICD-9: 794.5 4)  Try over the counter Lamisil or Lotrimen cream to skin rash

## 2010-10-18 NOTE — Letter (Signed)
Summary: [facility]  [facility]   Imported By: Maryln Gottron 10/26/2009 13:31:47  _____________________________________________________________________  External Attachment:    Type:   Image     Comment:   External Document

## 2010-10-18 NOTE — Medication Information (Signed)
Summary: Order for Diabetic Testing Supplies  Order for Diabetic Testing Supplies   Imported By: Maryln Gottron 06/15/2010 10:44:12  _____________________________________________________________________  External Attachment:    Type:   Image     Comment:   External Document

## 2010-10-20 NOTE — Progress Notes (Signed)
Summary: REFILL REQUEST HCTZ to [facility]  Phone Note Refill Request Message from:  Patient on October 11, 2010 10:17 AM  Refills Requested: Medication #1:  HYDROCHLOROTHIAZIDE 25 MG TABS 1/2 tablet by mouth once daily   Notes: 90-day supply... Pt needs paper Rx to submit to mail order.... Pt can be reached at [phone] with any questions or concerns.    Initial call taken by: Debbra Riding,  October 11, 2010 10:18 AM    Prescriptions: HYDROCHLOROTHIAZIDE 25 MG TABS (HYDROCHLOROTHIAZIDE) 1/2 tablet by mouth once daily  #90 x 1   Entered by:   Sid Falcon LPN   Authorized by:   Evelena Peat MD   Signed by:   Sid Falcon LPN on 09/81/1914   Method used:   Electronically to        [facility]* (retail)             ,          Ph: [phone]       Fax: [phone]   RxID:   [id number]

## 2010-10-20 NOTE — Assessment & Plan Note (Signed)
Summary: 3 month rov./njr   Vital Signs:  Patient profile:   77 year old male Weight:      231 pounds Temp:     98.3 degrees F oral BP sitting:   130 / 72  (left arm) Cuff size:   large  Vitals Entered By: Sid Falcon LPN (September 29, 2010 10:57 AM)  History of Present Illness: Patient here for followup medical problems. He has hypertension, type 2 diabetes, GERD, history of pancreas insufficiency. Blood sugars have remained stable. No hypoglycemic symptoms. Needs refills of glyburide. Eye exams thru [facility]. All medications reviewed.  Exercising with exercise cycle and good tolerance.  Doing 20 min per day.  Diabetes Management History:      The patient is an 77 years old male who comes in for evaluation of Type 2 Diabetes Mellitus.  He has not been enrolled in the "Diabetic Education Program".  He is checking home blood sugars.  He says that he is not exercising regularly.        Hypoglycemic symptoms are not occurring.  No hyperglycemic symptoms are reported.    Hypertension History:      He denies headache, chest pain, palpitations, dyspnea with exertion, orthopnea, PND, peripheral edema, visual symptoms, neurologic problems, and syncope.  He notes no problems with any antihypertensive medication side effects.        Positive major cardiovascular risk factors include male age 55 years old or older, diabetes, and hypertension.  Negative major cardiovascular risk factors include non-tobacco-user status.     Allergies: 1)  ! Amoxicillin (Amoxicillin)  Past History:  Past Medical History: Last updated: 01/25/2009 Allergies, Hay fever diabetes mellitus, type II GERD Hypertension Colon cancer 1990s, treated with segmental resection and adjuvant chemotherapy  Past Surgical History: Last updated: 01/25/2009 segmental colon resection for colon cancer 1990s  Family History: Last updated: 01/25/2009 Family History of Colon CA 1st degree relative <60 Family History  Diabetes 1st degree relative Stroke  Social History: Last updated: 01/25/2009 Occupation:  Midwife Single Alcohol use-no Former Smoker 2 sons  Risk Factors: Exercise: no (01/17/2010)  Risk Factors: Smoking Status: quit (12/01/2008) PMH-FH-SH reviewed for relevance  Review of Systems       The patient complains of weight gain.  The patient denies chest pain, syncope, dyspnea on exertion, peripheral edema, prolonged cough, headaches, hemoptysis, abdominal pain, melena, hematochezia, and severe indigestion/heartburn.    Diabetes Management Exam:    Foot Exam (with socks and/or shoes not present):       Sensory-Pinprick/Light touch:          Left medial foot (L-4): normal          Left dorsal foot (L-5): normal          Left lateral foot (S-1): normal          Right medial foot (L-4): normal          Right dorsal foot (L-5): normal          Right lateral foot (S-1): normal       Sensory-Monofilament:          Left foot: normal          Right foot: normal       Inspection:          Left foot: normal          Right foot: normal       Nails:          Left foot:  fungal infection          Right foot: fungal infection    Eye Exam:       Eye Exam done elsewhere          Date: 11/18/2009          Results: normal          [facility]   Impression & Recommendations:  Problem # 1:  DIABETES MELLITUS, TYPE II (ICD-250.00)  His updated medication list for this problem includes:    Benazepril Hcl 20 Mg Tabs (Benazepril hcl) ..... Once daily    Januvia 100 Mg Tabs (Sitagliptin phosphate) ..... Once daily    Actos 30 Mg Tabs (Pioglitazone hcl) ..... One half tablet daily.    Glyburide 2.5 Mg Tabs (Glyburide) ..... One by mouth once daily  Orders: Venipuncture (16109) Specimen Handling (60454) TLB-Lipid Panel (80061-LIPID) TLB-A1C / Hgb A1C (Glycohemoglobin) (83036-A1C)  Problem # 2:  ESSENTIAL HYPERTENSION (ICD-401.9)  His updated medication list for this problem  includes:    Benazepril Hcl 20 Mg Tabs (Benazepril hcl) ..... Once daily    Amlodipine Besylate 5 Mg Tabs (Amlodipine besylate) ..... Once daily    Hydrochlorothiazide 25 Mg Tabs (Hydrochlorothiazide) .Marland Kitchen... 1/2 tablet by mouth once daily  Problem # 3:  GERD (ICD-530.81)  Complete Medication List: 1)  Benazepril Hcl 20 Mg Tabs (Benazepril hcl) .... Once daily 2)  Amlodipine Besylate 5 Mg Tabs (Amlodipine besylate) .... Once daily 3)  Januvia 100 Mg Tabs (Sitagliptin phosphate) .... Once daily 4)  Actos 30 Mg Tabs (Pioglitazone hcl) .... One half tablet daily. 5)  Celebrex 200 Mg Caps (Celecoxib) .... As needed 6)  Hydrochlorothiazide 25 Mg Tabs (Hydrochlorothiazide) .... 1/2 tablet by mouth once daily 7)  Creon 12000 Unit Cpep (Pancrelipase (lip-prot-amyl)) .... One tab before meals three times a day 8)  Glyburide 2.5 Mg Tabs (Glyburide) .... One by mouth once daily 9)  Astelin 137 Mcg/spray Soln (Azelastine hcl) .Marland Kitchen.. 1-2 sprays per nostril two times a day as needed  Diabetes Management Assessment/Plan:      His blood pressure goal is < 130/80.    Hypertension Assessment/Plan:      The patient's hypertensive risk group is category C: Target organ damage and/or diabetes.  His calculated 10 year risk of coronary heart disease is 22 %.  Today's blood pressure is 130/72.  His blood pressure goal is < 130/80.  Patient Instructions: 1)  Please schedule a follow-up appointment in 3 months .  2)  It is important that you exercise reguarly at least 20 minutes 5 times a week. If you develop chest pain, have severe difficulty breathing, or feel very tired, stop exercising immediately and seek medical attention.  3)  You need to lose weight. Consider a lower calorie diet and regular exercise.  Prescriptions: GLYBURIDE 2.5 MG TABS (GLYBURIDE) one by mouth once daily  #90 x 3   Entered and Authorized by:   Evelena Peat MD   Signed by:   Evelena Peat MD on 09/29/2010   Method used:   Print then  Give to Patient   RxID:   [id number]    Orders Added: 1)  Venipuncture [30865] 2)  Specimen Handling [99000] 3)  TLB-Lipid Panel [80061-LIPID] 4)  TLB-A1C / Hgb A1C (Glycohemoglobin) [83036-A1C] 5)  Est. Patient Level IV [78469]

## 2010-12-24 LAB — COMPREHENSIVE METABOLIC PANEL
ALT: 36 U/L (ref 0–53)
AST: 37 U/L (ref 0–37)
Alkaline Phosphatase: 76 U/L (ref 39–117)
CO2: 28 mEq/L (ref 19–32)
GFR calc non Af Amer: 51 mL/min — ABNORMAL LOW (ref >60)
Glucose, Bld: 99 mg/dL (ref 70–99)
Potassium: 4.3 mEq/L (ref 3.5–5.1)
Sodium: 142 mEq/L (ref 135–145)

## 2010-12-24 LAB — DIFFERENTIAL
Basophils Relative: 0 % (ref 0–1)
Eosinophils Absolute: 0.2 10*3/uL (ref 0.0–0.7)
Eosinophils Relative: 4 % (ref 0–5)
Monocytes Relative: 10 % (ref 3–12)
Neutrophils Relative %: 65 % (ref 43–77)

## 2010-12-24 LAB — GLUCOSE, CAPILLARY
Glucose-Capillary: 128 mg/dL — ABNORMAL HIGH (ref 70–99)
Glucose-Capillary: 138 mg/dL — ABNORMAL HIGH (ref 70–99)

## 2010-12-24 LAB — CBC
Hemoglobin: 11.9 g/dL — ABNORMAL LOW (ref 13.0–17.0)
RBC: 3.77 MIL/uL — ABNORMAL LOW (ref 4.22–5.81)
WBC: 6.3 10*3/uL (ref 4.0–10.5)

## 2010-12-24 LAB — HEMOGLOBIN A1C: Mean Plasma Glucose: 120 mg/dL

## 2010-12-26 LAB — GLUCOSE, CAPILLARY: Glucose-Capillary: 87 mg/dL (ref 70–99)

## 2010-12-27 ENCOUNTER — Encounter: Payer: Self-pay | Admitting: Family Medicine

## 2010-12-29 ENCOUNTER — Ambulatory Visit (INDEPENDENT_AMBULATORY_CARE_PROVIDER_SITE_OTHER): Payer: BC Managed Care – PPO | Admitting: Family Medicine

## 2010-12-29 ENCOUNTER — Encounter: Payer: Self-pay | Admitting: Family Medicine

## 2010-12-29 DIAGNOSIS — M47817 Spondylosis without myelopathy or radiculopathy, lumbosacral region: Secondary | ICD-10-CM

## 2010-12-29 DIAGNOSIS — I10 Essential (primary) hypertension: Secondary | ICD-10-CM

## 2010-12-29 DIAGNOSIS — E119 Type 2 diabetes mellitus without complications: Secondary | ICD-10-CM

## 2010-12-29 LAB — HEMOGLOBIN A1C: Hgb A1c MFr Bld: 6.8 % — ABNORMAL HIGH (ref 4.6–6.5)

## 2010-12-29 NOTE — Progress Notes (Signed)
  Subjective:    Patient ID: Steven Ward, male    DOB: 02-12-1925, 77 y.o.   MRN: [id number]  HPI Patient seen for followup. He has hypertension, type 2 diabetes, GERD, history of lumbar spondylosis, and pancreatic exocrine insufficiency on chronic enzyme therapy. Complains of some recent weight gain. Exercises but inconsistently. Denies any chest pain, dizziness, shortness of breath. Blood sugar stable run 100-120. A1c has been well controlled. No symptoms of hyper or hypoglycemia.  Patient complains of some progressive low back pain. Present for several months if not years. Achy quality. Moderate severity. Pain with sitting but also change of position and walking. Had chiropractic therapy couple years ago without much improvement. Had plain x-rays at that time. No radiculopathy symptoms. No incontinence. No weakness or numbness. Denies any fever, chills, appetite, or weight loss symptoms. No back injury.   Review of Systems  Constitutional: Negative for fever, chills, activity change and appetite change.  HENT: Negative for trouble swallowing.   Respiratory: Negative for cough, shortness of breath and wheezing.   Cardiovascular: Negative for chest pain, palpitations and leg swelling.  Gastrointestinal: Negative for nausea, vomiting, abdominal pain, diarrhea, blood in stool and abdominal distention.  Genitourinary: Negative for dysuria.  Musculoskeletal: Positive for back pain. Negative for myalgias, joint swelling and gait problem.  Neurological: Negative for dizziness, syncope and headaches.  Psychiatric/Behavioral: Negative for dysphoric mood.       Objective:   Physical Exam  Constitutional: He is oriented to person, place, and time. He appears well-developed and well-nourished.  Cardiovascular: Normal rate, regular rhythm and normal heart sounds.   Pulmonary/Chest: Effort normal and breath sounds normal. No respiratory distress. He has no wheezes. He has no rales.  Abdominal:  Soft. Bowel sounds are normal. He exhibits no distension and no mass. There is no tenderness.  Musculoskeletal: He exhibits no edema.       Straight leg raise is negative. Patient has ability to flex back about 80 and extension about 10. No localized tenderness  Neurological: He is alert and oriented to person, place, and time. No cranial nerve deficit.       Full-strength lower extremities with plantar flexion and dorsiflexion. Deep tender reflexes are trace knee and ankle bilaterally. Normal symmetric function of patch.  Psychiatric: He has a normal mood and affect. His behavior is normal.          Assessment & Plan:  #1 type 2 diabetes with history of good control. Check A1c today. If stable check every 6 months. Continue yearly eye exams #2 hypertension stable continue current medications #3 chronic low back pain. Recommend followup physical therapy. No improvement 3 weeks consider repeat x-rays and further evaluation. We'll set up physical therapy

## 2010-12-30 NOTE — Progress Notes (Signed)
Quick Note:  Pt informed ______ 

## 2011-01-19 ENCOUNTER — Ambulatory Visit: Payer: BC Managed Care – PPO | Attending: Family Medicine

## 2011-01-19 DIAGNOSIS — IMO0001 Reserved for inherently not codable concepts without codable children: Secondary | ICD-10-CM | POA: Insufficient documentation

## 2011-01-19 DIAGNOSIS — R293 Abnormal posture: Secondary | ICD-10-CM | POA: Insufficient documentation

## 2011-01-19 DIAGNOSIS — M25659 Stiffness of unspecified hip, not elsewhere classified: Secondary | ICD-10-CM | POA: Insufficient documentation

## 2011-01-19 DIAGNOSIS — R5381 Other malaise: Secondary | ICD-10-CM | POA: Insufficient documentation

## 2011-01-25 ENCOUNTER — Ambulatory Visit: Payer: BC Managed Care – PPO

## 2011-01-31 NOTE — Discharge Summary (Signed)
NAME:  Steven Ward, Steven Ward NO.:  [id number]   MEDICAL RECORD NO.:  [id number]          PATIENT TYPE:  OIB   LOCATION:  1523                         FACILITY:  [facility]   PHYSICIAN:  Clovis Pu. Cornett, M.D.DATE OF BIRTH:  1925/06/07   DATE OF ADMISSION:  04/29/2009  DATE OF DISCHARGE:  04/30/2009                               DISCHARGE SUMMARY   ADMITTING DIAGNOSIS:  Symptomatic cholelithiasis.   DISCHARGE DIAGNOSIS:  Symptomatic cholelithiasis.   PROCEDURE PERFORMED:  Laparoscopic cholecystectomy with cholangiogram.   BRIEF HISTORY:  [name] is an 77 year old male with symptomatic  cholelithiasis.  He was admitted postoperatively after laparoscopic  cholecystectomy.   HOSPITAL COURSE:  Unremarkable.  He voided, tolerated his diet, was  afebrile.  There was some concern he may have chronic bronchitis his  lungs have been clear, but he has been coughing up some sputum.  He is  breathing well without any other problems.  I have asked him to contact  his primary care doctor as an outpatient to follow up on this.   DISCHARGE INSTRUCTIONS:  I will see him back into 2-3 weeks.  He has  been given a script for Vicodin 1-2 tabs q. 4 p.r.n. pain, will resume a  regular diet and will resume full activity in 7-10 days.  I will see him  back again 2-3 weeks.   CONDITION ON DISCHARGE:  Improved.      Thomas A. Cornett, M.D.  Electronically Signed     TAC/MEDQ  D:  04/30/2009  T:  04/30/2009  Job:  [id number]

## 2011-01-31 NOTE — Op Note (Signed)
NAME:  Steven Ward, Steven Ward NO.:  [id number]   MEDICAL RECORD NO.:  [id number]          PATIENT TYPE:  AMB   LOCATION:  DAY                          FACILITY:  [facility]   PHYSICIAN:  Thomas A. Cornett, M.D.DATE OF BIRTH:  30-Jan-1925   DATE OF PROCEDURE:  DATE OF DISCHARGE:  04/29/2009                               OPERATIVE REPORT   PREOPERATIVE DIAGNOSIS:  Symptomatic cholelithiasis.   POSTOPERATIVE DIAGNOSIS:  Symptomatic cholelithiasis.   PROCEDURE:  Laparoscopic cholecystectomy with intraoperative  cholangiogram.   SURGEON:  Thomas A. Cornett, M.D.   ANESTHESIA:  General endotracheal anesthesia, 0.25% Sensorcaine local.   ESTIMATED BLOOD LOSS:  10 mL.   SPECIMEN:  Gallbladder and gallstones to pathology.   INDICATIONS FOR PROCEDURE:  The patient is a 77 year old male with  symptomatic cholelithiasis.  We discussed options of treatment and  recommended laparoscopic cholecystectomy for this.   DESCRIPTION OF PROCEDURE:  The patient was brought to the operating  room, placed supine.  After induction of general anesthesia, the abdomen  was prepped and draped in sterile fashion.  He received preoperative  antibiotics in a timely fashion.  He had a previous sigmoid colectomy;  therefore, used a 5-mm OptiVu to access the abdominal cavity through the  right upper quadrant.  The OptiVu was advanced into the subcutaneous  tissues, the muscle walls sequentially into the abdominal cavity without  difficulty.  Insufflation was then done to 15 mmHg CO2.  A 5-mm scope  was placed.  There is no evidence of any bowel injury with insertion of  this port.  I then placed 11 mm subxiphoid port under direct vision.  I  placed a second 5 mm port just medial to the OptiVu port.  I was then  able to place another 11 mm port for the extraction point just left of  the umbilicus, and midline away from some intra-abdominal adhesions.  He  had some minimal adhesions from the sigmoid  colectomy.  He was placed in  reverse Trendelenburg and a camera was placed to the lower 11 mL port.  Gallbladder is identified.  He had a thick  peritoneal covering.  We  grabbed the dome and retracted to the right shoulder and a second  grasper was used to grab the infundibulum and pulled to the patient's  right lower quadrant.  I used the cautery to clean off the peritoneum to  identify the gallbladder itself.  The cystic artery was anterior to the  cystic duct.  I went ahead and controlled  this with clips and divided  it.  This opened up the critical angle around the cystic duct quite  nicely.  I then placed clips in the gallbladder side of the cystic duct,  made a small incision through a separate stab wound and placed a Cook  cholangiogram catheter through this.  This was placed in the cystic  duct, held in place by a clip.  One-half strength Hypaque dye was used.  Intraoperative cholangiogram performed which showed some extravasation  catheter.  Otherwise cystic duct was patent with flow all the way down  the common  duct into the duodenum.  There is free flow of contrast the  common hepatic duct into right left hepatic ductules.  I then removed  the catheter, placed four clips across the cystic duct remnant and  divided it.  Cautery was used to dissect the gallbladder from the  gallbladder fossa.  There is a very large posterior branch cystic artery  that I took up on the gallbladder itself  between clips.  I removed the  gallbladder from the gallbladder fossa.  It was somewhat intrahepatic  though.  He had some oozing in the gallbladder bed controlled with a  combination cautery and Surgicel.  The gallbladder was then placed in  EndoCatch bag.  I reexamined the cystic duct and cystic artery stumps  and these were hemostatic with no evidence of bile leakage or any  bleeding.  There was some oozing from bed and I placed Surgicel into the  bed.  Gallbladder was then extracted through  the umbilicus.  I extracted  the lower 11-mm port just lateral to the umbilicus.  I went ahead and  closed this port with two sutures of 0 Vicryl using a suture passer  under direct vision.  Reexamined the gallbladder bed found to be  hemostatic with no evidence of bleeding or bile leakage.  Gallbladder  was then passed off the field at this point.  Irrigation was suctioned  out.  I then removed 0.5-mm ports and subxiphoid port without  difficulty.  The CO2 was allowed to escape.  A 4-0 Monocryl was used to  close all skin incisions with Dermabond as a dressing.  All final  counts, sponge, needle and instruments, were found be correct at this  portion case.  The patient was awakened and taken to the recovery room  in satisfactory condition.      Thomas A. Cornett, M.D.  Electronically Signed     TAC/MEDQ  D:  04/29/2009  T:  04/29/2009  Job:  [id number]   cc:   Evelena Peat, M.D.

## 2011-02-01 ENCOUNTER — Ambulatory Visit: Payer: BC Managed Care – PPO

## 2011-02-03 ENCOUNTER — Ambulatory Visit: Payer: BC Managed Care – PPO | Admitting: Physical Therapy

## 2011-02-07 ENCOUNTER — Ambulatory Visit: Payer: BC Managed Care – PPO | Admitting: Physical Therapy

## 2011-02-09 ENCOUNTER — Ambulatory Visit: Payer: BC Managed Care – PPO | Admitting: Physical Therapy

## 2011-02-14 ENCOUNTER — Ambulatory Visit: Payer: BC Managed Care – PPO | Admitting: Physical Therapy

## 2011-02-17 ENCOUNTER — Ambulatory Visit: Payer: BC Managed Care – PPO | Attending: Family Medicine | Admitting: Physical Therapy

## 2011-02-17 DIAGNOSIS — R293 Abnormal posture: Secondary | ICD-10-CM | POA: Insufficient documentation

## 2011-02-17 DIAGNOSIS — M25659 Stiffness of unspecified hip, not elsewhere classified: Secondary | ICD-10-CM | POA: Insufficient documentation

## 2011-02-17 DIAGNOSIS — IMO0001 Reserved for inherently not codable concepts without codable children: Secondary | ICD-10-CM | POA: Insufficient documentation

## 2011-02-17 DIAGNOSIS — R5381 Other malaise: Secondary | ICD-10-CM | POA: Insufficient documentation

## 2011-03-26 ENCOUNTER — Other Ambulatory Visit: Payer: Self-pay | Admitting: Family Medicine

## 2011-04-27 ENCOUNTER — Encounter: Payer: Self-pay | Admitting: Family Medicine

## 2011-04-27 ENCOUNTER — Ambulatory Visit (INDEPENDENT_AMBULATORY_CARE_PROVIDER_SITE_OTHER): Payer: BC Managed Care – PPO | Admitting: Family Medicine

## 2011-04-27 VITALS — BP 120/64 | Temp 98.0°F | Wt 228.0 lb

## 2011-04-27 DIAGNOSIS — IMO0001 Reserved for inherently not codable concepts without codable children: Secondary | ICD-10-CM

## 2011-04-27 DIAGNOSIS — E119 Type 2 diabetes mellitus without complications: Secondary | ICD-10-CM

## 2011-04-27 DIAGNOSIS — M791 Myalgia, unspecified site: Secondary | ICD-10-CM

## 2011-04-27 DIAGNOSIS — I10 Essential (primary) hypertension: Secondary | ICD-10-CM

## 2011-04-27 NOTE — Progress Notes (Signed)
  Subjective:    Patient ID: Steven Ward, male    DOB: April 17, 1925, 77 y.o.   MRN: [id number]  HPI Medical followup.   Type 2 diabetes, hypertension, GERD and history of pancreatic enzyme deficiency.  Diabetes stable. Last A1c 6.6%. No hypoglycemic symptoms. Podiatrist exam yesterday. No visual changes.  Hypertension treated with amlodipine and Lotensin and hydrochlorothiazide. No orthostasis. Compliant with medications.  Muscle aches mostly legs and back diffusely the past several weeks if not months. Occasional bilateral hip pain. No use of statin medication. TSH was normal approximately one year ago. No significant arthralgias.  No alleviating factors No inflammatory joint changes such as erythema or warmth.   Review of Systems  Constitutional: Positive for fatigue. Negative for fever and chills.  Eyes: Negative for visual disturbance.  Respiratory: Negative for cough and shortness of breath.   Cardiovascular: Negative for chest pain, palpitations and leg swelling.  Gastrointestinal: Negative for nausea, vomiting, abdominal pain, diarrhea and blood in stool.  Genitourinary: Negative for dysuria.  Musculoskeletal: Positive for myalgias and back pain. Negative for joint swelling.  Skin: Negative for rash.  Neurological: Negative for dizziness and headaches.  Hematological: Negative for adenopathy. Does not bruise/bleed easily.  Psychiatric/Behavioral: Negative for confusion.       Objective:   Physical Exam  Constitutional: He is oriented to person, place, and time. He appears well-developed and well-nourished. No distress.  HENT:  Right Ear: External ear normal.  Left Ear: External ear normal.  Mouth/Throat: Oropharynx is clear and moist.  Neck: Neck supple.  Cardiovascular: Normal rate and regular rhythm.   Pulmonary/Chest: Effort normal and breath sounds normal. No respiratory distress. He has no wheezes. He has no rales.  Musculoskeletal: He exhibits no edema and no  tenderness.  Lymphadenopathy:    He has no cervical adenopathy.  Neurological: He is alert and oriented to person, place, and time.  Psychiatric: He has a normal mood and affect. His behavior is normal.          Assessment & Plan:  #1 type 2 diabetes. Recheck A1c. Work on weight loss #2 myalgias. Clinically doubt polymyalgia rheumatica but given age check sed rate. Check vitamin D level. Recent TSH normal #3 hypertension stable continue current medications

## 2011-04-28 MED ORDER — PREDNISONE 10 MG PO TABS: ORAL_TABLET | ORAL | Status: DC

## 2011-04-28 NOTE — Progress Notes (Signed)
Addended by: Melchor Amour on: 04/28/2011 05:39 PM   Modules accepted: Orders

## 2011-05-03 ENCOUNTER — Ambulatory Visit: Payer: BC Managed Care – PPO | Admitting: Family Medicine

## 2011-05-03 ENCOUNTER — Telehealth: Payer: Self-pay | Admitting: Family Medicine

## 2011-05-03 NOTE — Telephone Encounter (Signed)
Dr. Caryl Never, pt called and wanted you to know that the Rx for Prednisone is working very well.

## 2011-05-03 NOTE — Telephone Encounter (Signed)
Prednisone helped pt significantly. Suspect he may have PMR.  Prednisone 10 mg daily and follow up within the next 2 weeks and will repeat sed rate on med.

## 2011-05-04 NOTE — Telephone Encounter (Signed)
Pt informed and voiced his understanding 

## 2011-05-19 ENCOUNTER — Encounter: Payer: Self-pay | Admitting: Family Medicine

## 2011-05-19 ENCOUNTER — Ambulatory Visit (INDEPENDENT_AMBULATORY_CARE_PROVIDER_SITE_OTHER): Payer: BC Managed Care – PPO | Admitting: Family Medicine

## 2011-05-19 DIAGNOSIS — M791 Myalgia, unspecified site: Secondary | ICD-10-CM

## 2011-05-19 DIAGNOSIS — E119 Type 2 diabetes mellitus without complications: Secondary | ICD-10-CM

## 2011-05-19 DIAGNOSIS — I10 Essential (primary) hypertension: Secondary | ICD-10-CM

## 2011-05-19 DIAGNOSIS — IMO0001 Reserved for inherently not codable concepts without codable children: Secondary | ICD-10-CM

## 2011-05-19 DIAGNOSIS — R0989 Other specified symptoms and signs involving the circulatory and respiratory systems: Secondary | ICD-10-CM

## 2011-05-19 LAB — POCT SEDIMENTATION RATE: POCT SED RATE: 40 mm/hr — AB (ref 0–22)

## 2011-05-19 NOTE — Progress Notes (Signed)
Subjective:    Patient ID: Steven Ward, male    DOB: [birth year], 77 y.o.   MRN: [id number]  HPI Patient seen for followup recent myalgias. We suspected possible polymyalgia rheumatica. Patient did not have very impressive sed rate which was 40 but did have very prompt response to 20 mg prednisone initially. We tapered down to 10 mg and at this point he is still having significant fatigue and muscle aches- mostly low back and legs. Patient does not take statin medication. Really more myalgias than arthralgia. Recent vitamin D level low normal. No history of thyroid disease  Patient relates episode about 2 weeks ago while walking on a hot day of increased heart rate and dyspnea. No associated chest pain. In retrospect, he's noticed some recent dyspnea with other activities as well but never any chest pain. He has no history of CAD but has multiple risk factors including age, type 2 diabetes, hypertension history.  Patient relates what sounds like a heart catheterization about 8 years ago in [place]. He describes what sounds like possible ablation therapy for atrial fibrillation. We do not have those records at this time.  Past Medical History  Diagnosis Date  . DIABETES MELLITUS, TYPE II 12/01/2008  . ESSENTIAL HYPERTENSION 12/01/2008  . ALLERGIC RHINITIS 12/01/2008  . GERD 12/01/2008  . GALLSTONES 04/07/2009  . SPONDYLOSIS, LUMBAR 12/01/2008  . EDEMA LEG 05/04/2009  . ABNORMAL THYROID FUNCTION TESTS 01/17/2010  . COLONIC POLYPS, HX OF 12/01/2008   Past Surgical History  Procedure Date  . Colon surgery     resection 1990 for cancer    reports that he quit smoking about 42 years ago. His smoking use included Cigarettes. He has a 20 pack-year smoking history. He does not have any smokeless tobacco history on file. His alcohol and drug histories not on file. family history includes Cancer in his other; Diabetes in his other; and Stroke in his other. Allergies  Allergen Reactions  .  Amoxicillin     REACTION: rash, dizziness      Review of Systems  Constitutional: Positive for fatigue. Negative for fever, chills and unexpected weight change.  Eyes: Negative for visual disturbance.  Respiratory: Positive for shortness of breath. Negative for cough and wheezing.   Cardiovascular: Negative for chest pain, palpitations and leg swelling.  Gastrointestinal: Negative for abdominal pain.  Genitourinary: Negative for dysuria.  Musculoskeletal: Positive for myalgias and back pain. Negative for gait problem.  Neurological: Negative for headaches.  Psychiatric/Behavioral: Negative for dysphoric mood.       Objective:   Physical Exam  Constitutional: He is oriented to person, place, and time. He appears well-developed and well-nourished.  Neck: Neck supple. No thyromegaly present.  Cardiovascular: Normal rate and regular rhythm.   Pulmonary/Chest: Effort normal and breath sounds normal. No respiratory distress. He has no wheezes. He has no rales.  Musculoskeletal: He exhibits no edema.  Lymphadenopathy:    He has no cervical adenopathy.  Neurological: He is alert and oriented to person, place, and time.          Assessment & Plan:  #1 myalgias. With equivocal response to prednisone, doubt polymyalgia rheumatica. Reassess sedimentation rate today with patient currently on 10 mg prednisone. We'll likely discontinue prednisone.  Pt requesting Lyme antibody testing but suspicion low with no clear hx of recent tick bite. #2 recent dyspnea with exertion. Patient at high risk for CAD. Cardiology referral for further evaluation  #3 type 2 diabetes well controlled with recent A1c below 7  and fasting blood sugar this morning is 67

## 2011-05-25 ENCOUNTER — Encounter: Payer: Self-pay | Admitting: Cardiovascular Disease

## 2011-05-25 LAB — B. BURGDORFI ANTIBODIES BY WB
B burgdorferi IgG Abs (IB): NEGATIVE
B burgdorferi IgM Abs (IB): NEGATIVE

## 2011-05-30 ENCOUNTER — Ambulatory Visit: Payer: BC Managed Care – PPO | Admitting: Cardiovascular Disease

## 2011-05-30 NOTE — Progress Notes (Signed)
Quick Note:  Pt informed ______ 

## 2011-06-23 ENCOUNTER — Encounter: Payer: Self-pay | Admitting: Cardiovascular Disease

## 2011-06-23 ENCOUNTER — Ambulatory Visit (INDEPENDENT_AMBULATORY_CARE_PROVIDER_SITE_OTHER): Payer: BC Managed Care – PPO | Admitting: Cardiovascular Disease

## 2011-06-23 VITALS — BP 126/77 | HR 69 | Ht 68.0 in | Wt 229.0 lb

## 2011-06-23 DIAGNOSIS — I10 Essential (primary) hypertension: Secondary | ICD-10-CM

## 2011-06-23 DIAGNOSIS — R0600 Dyspnea, unspecified: Secondary | ICD-10-CM | POA: Insufficient documentation

## 2011-06-23 DIAGNOSIS — I480 Paroxysmal atrial fibrillation: Secondary | ICD-10-CM | POA: Insufficient documentation

## 2011-06-23 DIAGNOSIS — R0989 Other specified symptoms and signs involving the circulatory and respiratory systems: Secondary | ICD-10-CM

## 2011-06-23 DIAGNOSIS — R609 Edema, unspecified: Secondary | ICD-10-CM

## 2011-06-23 DIAGNOSIS — I4891 Unspecified atrial fibrillation: Secondary | ICD-10-CM

## 2011-06-23 DIAGNOSIS — R0609 Other forms of dyspnea: Secondary | ICD-10-CM

## 2011-06-23 DIAGNOSIS — E119 Type 2 diabetes mellitus without complications: Secondary | ICD-10-CM

## 2011-06-23 NOTE — Assessment & Plan Note (Signed)
Likely from weight gain.  Needs CXR and echo.  Will do myovue to R/O ischemic equivalent.

## 2011-06-23 NOTE — Assessment & Plan Note (Signed)
Low sodium diet continue diuretic and consider stopping actos

## 2011-06-23 NOTE — Assessment & Plan Note (Signed)
Primary issue insulin resistance and central obesity.  Low carb diet.  Actos likely contributing to weight gain and edema.  Consider stopping

## 2011-06-23 NOTE — Assessment & Plan Note (Signed)
No palpitations and ECG looks fine.  ASA

## 2011-06-23 NOTE — Assessment & Plan Note (Signed)
Well controlled.  Continue current medications and low sodium Dash type diet.    

## 2011-06-23 NOTE — Progress Notes (Signed)
77 yo referred for multiple CRF;s and dyspnea.  Has gained a lot of weight the last year due to digestive problems.  History of PAF with ? Ablation in [place]. 8 years ago.  No recent cardiac issues or w/u.  Denies SSCP but having progressive dyspnea.  No lung disease and nonsmoker.  No recent CXR.  Sedentary and poor diet.  Has mild LE edema.  Does work driving a bus regularly.  Compliant with meds.  No cough sputum or hemoptysis.  No PND orthopnea.  Likely sleep apnea with snoring but not w/u  ROS: Denies fever, malais, weight loss, blurry vision, decreased visual acuity, cough, sputum, SOB, hemoptysis, pleuritic pain, palpitaitons, heartburn, abdominal pain, melena, lower extremity edema, claudication, or rash.  All other systems reviewed and negative   General: Affect appropriate Healthy:  appears stated age HEENT: normal Neck supple with no adenopathy JVP normal no bruits no thyromegaly Lungs clear with no wheezing and good diaphragmatic motion Heart:  S1/S2 no murmur,rub, gallop or click PMI normal Abdomen: benighn, BS positve, no tenderness, no AAA no bruit.  No HSM or HJR Distal pulses intact with no bruits No edema Neuro non-focal Skin warm and dry No muscular weakness  Medications Current Outpatient Prescriptions  Medication Sig Dispense Refill  . amLODipine (NORVASC) 5 MG tablet Take 5 mg by mouth daily.        Marland Kitchen azelastine (ASTELIN) 137 MCG/SPRAY nasal spray Place 1 spray into the nose 2 (two) times daily as needed. Use in each nostril as directed       . benazepril (LOTENSIN) 20 MG tablet Take 20 mg by mouth daily.        . celecoxib (CELEBREX) 200 MG capsule Take 200 mg by mouth as needed.        . glyBURIDE (DIABETA) 2.5 MG tablet Take 2.5 mg by mouth daily with breakfast.        . hydrochlorothiazide 25 MG tablet Take 25 mg by mouth daily.        Marland Kitchen JANUVIA 100 MG tablet TAKE 1 TABLET DAILY  90 tablet  2  . Pancrelipase, Lip-Prot-Amyl, (CREON) 12000 UNITS CPEP Take by  mouth 3 (three) times daily before meals.        . pioglitazone (ACTOS) 30 MG tablet Take 30 mg by mouth daily.          Allergies Amoxicillin  Family History: Family History  Problem Relation Age of Onset  . Cancer Other     colon  . Diabetes Other   . Stroke Other     Social History: History   Social History  . Marital Status: Widowed    Spouse Name: N/A    Number of Children: 2  . Years of Education: N/A   Occupational History  . bus driver [employer]   Social History Main Topics  . Smoking status: Former Smoker -- 1.0 packs/day for 20 years    Types: Cigarettes    Quit date: 12/28/1968  . Smokeless tobacco: Not on file  . Alcohol Use: No  . Drug Use: Not on file  . Sexually Active: Not on file   Other Topics Concern  . Not on file   Social History Narrative  . No narrative on file    Electrocardiogram: 05/19/11  NSR 72 normal ECG  Assessment and Plan

## 2011-06-23 NOTE — Patient Instructions (Addendum)
Your physician has requested that you have en exercise stress myoview. For further information please visit [email or web address] Please follow instruction sheet, as given.   Your physician has requested that you have an echocardiogram. Echocardiography is a painless test that uses sound waves to create images of your heart. It provides your doctor with information about the size and shape of your heart and how well your heart's chambers and valves are working. This procedure takes approximately one hour. There are no restrictions for this procedure.   A chest x-ray takes a picture of the organs and structures inside the chest, including the heart, lungs, and blood vessels. This test can show several things, including, whether the heart is enlarges; whether fluid is building up in the lungs; and whether pacemaker / defibrillator leads are still in place. AT [address]

## 2011-06-27 ENCOUNTER — Ambulatory Visit (INDEPENDENT_AMBULATORY_CARE_PROVIDER_SITE_OTHER)
Admission: RE | Admit: 2011-06-27 | Discharge: 2011-06-27 | Disposition: A | Discharge status: Home / Self Care | Payer: BC Managed Care – PPO | Source: Ambulatory Visit | Attending: Cardiovascular Disease | Admitting: Cardiovascular Disease

## 2011-06-27 DIAGNOSIS — R0989 Other specified symptoms and signs involving the circulatory and respiratory systems: Secondary | ICD-10-CM

## 2011-06-27 DIAGNOSIS — R0600 Dyspnea, unspecified: Secondary | ICD-10-CM

## 2011-07-05 ENCOUNTER — Encounter: Payer: Self-pay | Admitting: *Deleted

## 2011-07-19 ENCOUNTER — Ambulatory Visit (HOSPITAL_COMMUNITY): Payer: BC Managed Care – PPO | Attending: Cardiovascular Disease | Admitting: Radiology

## 2011-07-19 ENCOUNTER — Ambulatory Visit (HOSPITAL_BASED_OUTPATIENT_CLINIC_OR_DEPARTMENT_OTHER): Payer: BC Managed Care – PPO | Admitting: Radiology

## 2011-07-19 VITALS — Ht 68.0 in | Wt 223.0 lb

## 2011-07-19 DIAGNOSIS — R0600 Dyspnea, unspecified: Secondary | ICD-10-CM

## 2011-07-19 DIAGNOSIS — I4891 Unspecified atrial fibrillation: Secondary | ICD-10-CM | POA: Insufficient documentation

## 2011-07-19 DIAGNOSIS — K219 Gastro-esophageal reflux disease without esophagitis: Secondary | ICD-10-CM | POA: Insufficient documentation

## 2011-07-19 DIAGNOSIS — E119 Type 2 diabetes mellitus without complications: Secondary | ICD-10-CM | POA: Insufficient documentation

## 2011-07-19 DIAGNOSIS — I379 Nonrheumatic pulmonary valve disorder, unspecified: Secondary | ICD-10-CM | POA: Insufficient documentation

## 2011-07-19 DIAGNOSIS — R609 Edema, unspecified: Secondary | ICD-10-CM | POA: Insufficient documentation

## 2011-07-19 DIAGNOSIS — I059 Rheumatic mitral valve disease, unspecified: Secondary | ICD-10-CM | POA: Insufficient documentation

## 2011-07-19 DIAGNOSIS — R0989 Other specified symptoms and signs involving the circulatory and respiratory systems: Secondary | ICD-10-CM | POA: Insufficient documentation

## 2011-07-19 DIAGNOSIS — I10 Essential (primary) hypertension: Secondary | ICD-10-CM | POA: Insufficient documentation

## 2011-07-19 DIAGNOSIS — I079 Rheumatic tricuspid valve disease, unspecified: Secondary | ICD-10-CM | POA: Insufficient documentation

## 2011-07-19 DIAGNOSIS — Z87891 Personal history of nicotine dependence: Secondary | ICD-10-CM | POA: Insufficient documentation

## 2011-07-19 DIAGNOSIS — R0609 Other forms of dyspnea: Secondary | ICD-10-CM | POA: Insufficient documentation

## 2011-07-19 DIAGNOSIS — E669 Obesity, unspecified: Secondary | ICD-10-CM | POA: Insufficient documentation

## 2011-07-19 MED ORDER — TECHNETIUM TC 99M TETROFOSMIN IV KIT
33.0000 | PACK | Freq: Once | INTRAVENOUS | Status: AC | PRN
  Administered 2011-07-19: 33 via INTRAVENOUS

## 2011-07-19 MED ORDER — TECHNETIUM TC 99M TETROFOSMIN IV KIT
11.0000 | PACK | Freq: Once | INTRAVENOUS | Status: AC | PRN
  Administered 2011-07-19: 11 via INTRAVENOUS

## 2011-07-19 NOTE — Progress Notes (Signed)
[facility] SITE 3 NUCLEAR MED [address] [place] [address] [phone]  Cardiology Nuclear Med Study  Steven Ward is a 77 y.o. male [id number] Sep 06, 1925   Nuclear Med Background Indication for Stress Test:  Evaluation for Ischemia History:  >15 yrs ago GXT:OK per patient; ~8 yrs ago Cath:([place].)OK per patient; H/O PAF-  S/P Ablation; H/O Chemo  Cardiac Risk Factors: History of Smoking, Hypertension, NIDDM and Obesity  Symptoms:  DOE, Fatigue, Palpitations and Rapid HR   Nuclear Pre-Procedure Caffeine/Decaff Intake:  None NPO After: 5 pm   Lungs:  Clear. IV 0.9% NS with Angio Cath:  20g  IV Site: R Hand  IV Started by:  Bonnita Levan, RN  Chest Size (in):  50 Cup Size: n/a  Height: 5\' 8"  (1.727 m)  Weight:  223 lb (101.152 kg)  BMI:  Body mass index is 33.91 kg/(m^2). Tech Comments:  Patient held all meds today    Nuclear Med Study 1 or 2 day study: 1 day  Stress Test Type:  Stress  Reading MD: Arvilla Meres, MD  Order Authorizing Provider:  Charlton Haws, MD  Resting Radionuclide: Technetium 9m Tetrofosmin  Resting Radionuclide Dose: 11.0 mCi   Stress Radionuclide:  Technetium 53m Tetrofosmin  Stress Radionuclide Dose: 33.0 mCi           Stress Protocol Rest HR: 61 Stress HR: 141  Rest BP: Sitting:128/65; Standing:129/68 Stress BP: 163/76  Exercise Time (min): 4:00 METS: 4.7   Predicted Max HR: 136 bpm % Max HR: 103.68 bpm Rate Pressure Product: 46962   Dose of Adenosine (mg):  n/a Dose of Lexiscan: n/a mg  Dose of Atropine (mg): n/a Dose of Dobutamine: n/a mcg/kg/min (at max HR)  Stress Test Technologist: Smiley Houseman, CMA-N  Nuclear Technologist:  Doyne Keel, CNMT     Rest Procedure:  Myocardial perfusion imaging was performed at rest 45 minutes following the intravenous administration of Technetium 65m Tetrofosmin.  Rest ECG: Marked 1st degree AVB.  Stress Procedure:  The patient exercised for four minutes on the  treadmill utilizing the Bruce protocol.  The patient stopped due to fatigue and dyspnea, with an O2 Sat of 94% at peak exercise. He denied any chest pain.  There were no significant ST-T wave changes with a rare PVC noted.  Technetium 74m Tetrofosmin was injected at peak exercise and myocardial perfusion imaging was performed after a brief delay.  Stress ECG: No significant change from baseline ECG  QPS Raw Data Images:  Normal; no motion artifact; normal heart/lung ratio. Stress Images:  Normal homogeneous uptake in all areas of the myocardium. Rest Images:  Normal homogeneous uptake in all areas of the myocardium. Subtraction (SDS):  Normal Transient Ischemic Dilatation (Normal <1.22):  1.02 Lung/Heart Ratio (Normal <0.45):  0.33  Quantitative Gated Spect Images QGS EDV:  97 ml QGS ESV:  27 ml QGS cine images:  NL LV Function; NL Wall Motion QGS EF: 72%  Impression Exercise Capacity:  Fair exercise capacity. BP Response:  Normal blood pressure response. Clinical Symptoms:  There is dyspnea. ECG Impression:  No significant ST segment change suggestive of ischemia. Comparison with Prior Nuclear Study: No previous nuclear study performed  Overall Impression:  Normal stress nuclear study.   Daniel Bensimhon

## 2011-07-25 ENCOUNTER — Telehealth: Payer: Self-pay | Admitting: Cardiovascular Disease

## 2011-07-25 NOTE — Telephone Encounter (Signed)
Pt returning call to Debra. Please call back.  

## 2011-07-25 NOTE — Telephone Encounter (Signed)
Spoke with pt, aware of echo results Steven Ward  

## 2011-08-22 ENCOUNTER — Encounter: Payer: Self-pay | Admitting: Family Medicine

## 2011-08-22 ENCOUNTER — Ambulatory Visit (INDEPENDENT_AMBULATORY_CARE_PROVIDER_SITE_OTHER): Payer: BC Managed Care – PPO | Admitting: Family Medicine

## 2011-08-22 VITALS — BP 140/70 | Temp 98.2°F | Wt 230.0 lb

## 2011-08-22 DIAGNOSIS — Z23 Encounter for immunization: Secondary | ICD-10-CM

## 2011-08-22 DIAGNOSIS — R609 Edema, unspecified: Secondary | ICD-10-CM

## 2011-08-22 DIAGNOSIS — E119 Type 2 diabetes mellitus without complications: Secondary | ICD-10-CM

## 2011-08-22 DIAGNOSIS — R21 Rash and other nonspecific skin eruption: Secondary | ICD-10-CM

## 2011-08-22 LAB — HEMOGLOBIN A1C: Hgb A1c MFr Bld: 6.5 % (ref 4.6–6.5)

## 2011-08-22 LAB — BASIC METABOLIC PANEL
BUN: 21 mg/dL (ref 6–23)
Chloride: 110 mEq/L (ref 96–112)
GFR: 48.82 mL/min — ABNORMAL LOW (ref >60.00)
Potassium: 4.9 mEq/L (ref 3.5–5.1)
Sodium: 144 mEq/L (ref 135–145)

## 2011-08-22 LAB — TSH: TSH: 2.01 u[IU]/mL (ref 0.35–5.50)

## 2011-08-22 NOTE — Progress Notes (Signed)
  Subjective:    Patient ID: Steven Ward, male    DOB: 14-Apr-1925, 77 y.o.   MRN: [id number]  HPI  Increased leg edema and skin rash which is pruritic lower legs. Recently switched to generic for Actos. Also taking generic pancreatic enzymes. This is a different brand. Started those about the same time. Rash confined to lower legs. Increased edema past couple weeks. No orthopnea. Some chronic dyspnea with activity which is unchanged. No cough. Edema worse late in the day and improved early morning. Also takes amlodipine for hypertension and other medications which are reviewed. Blood sugars well controlled. Most recent A1c 6.7%.  Past Medical History  Diagnosis Date  . DIABETES MELLITUS, TYPE II 12/01/2008  . ESSENTIAL HYPERTENSION 12/01/2008  . ALLERGIC RHINITIS 12/01/2008  . GERD 12/01/2008  . GALLSTONES 04/07/2009  . SPONDYLOSIS, LUMBAR 12/01/2008  . EDEMA LEG 05/04/2009  . ABNORMAL THYROID FUNCTION TESTS 01/17/2010  . COLONIC POLYPS, HX OF 12/01/2008  . Hay fever   . Colon cancer    Past Surgical History  Procedure Date  . Colon surgery     resection 1990 for cancer    reports that he quit smoking about 42 years ago. His smoking use included Cigarettes. He has a 20 pack-year smoking history. He does not have any smokeless tobacco history on file. He reports that he does not drink alcohol. His drug history not on file. family history includes Cancer in his other; Diabetes in his other; and Stroke in his other. Allergies  Allergen Reactions  . Amoxicillin     REACTION: rash, dizziness      Review of Systems  Constitutional: Positive for fatigue. Negative for fever, chills and appetite change.  Respiratory: Positive for shortness of breath. Negative for cough and wheezing.   Cardiovascular: Positive for leg swelling. Negative for chest pain and palpitations.  Gastrointestinal: Negative for abdominal pain.  Genitourinary: Negative for decreased urine volume.       Objective:   Physical Exam  Constitutional: He appears well-developed and well-nourished.  Cardiovascular: Normal rate and regular rhythm.   Pulmonary/Chest: Effort normal and breath sounds normal. No respiratory distress. He has no wheezes. He has no rales.  Musculoskeletal: He exhibits edema.       1+ pitting edema lower legs bilaterally  Skin:       Nonspecific erythematous minimally raised blanching rash lower legs. No pustules          Assessment & Plan:  #1 leg edema. Probably multifactorial.  On amlodipine and Actos. Check basic metabolic panel. Repeat TSH. Hold Actos for now #2 skin rash lower legs. Patient concerned drug-related. Correlates with onset of generic Actos. Hold Actos as above. Reassess 3 weeks  #3 type 2 diabetes with history of good control. Stopping Actos as above. Recheck A1c today

## 2011-08-22 NOTE — Patient Instructions (Signed)
Stop Actos for now and let's plan follow up in 3 weeks to reassess

## 2011-08-23 NOTE — Progress Notes (Signed)
Quick Note:  Pt informed ______ 

## 2011-09-18 ENCOUNTER — Ambulatory Visit: Payer: BC Managed Care – PPO | Admitting: Family Medicine

## 2011-09-23 ENCOUNTER — Other Ambulatory Visit: Payer: Self-pay | Admitting: Family Medicine

## 2011-09-25 ENCOUNTER — Other Ambulatory Visit: Payer: Self-pay | Admitting: Family Medicine

## 2011-09-25 NOTE — Telephone Encounter (Signed)
Pt called and is req refill of glyBURIDE (DIABETA) 2.5 MG tablet.  Pt req to increase this med to 5mg  if possible. Pls call in to [facility] on [address]. Pt would like to be notified when this has been called in to pharmacy.

## 2011-09-26 NOTE — Telephone Encounter (Signed)
VM left for pt to call back and clarify request in med dose.

## 2011-09-27 NOTE — Telephone Encounter (Signed)
I've tried to call pt several times to clarify his request, no answer.

## 2011-09-29 ENCOUNTER — Ambulatory Visit: Payer: BC Managed Care – PPO | Admitting: Family Medicine

## 2011-10-02 ENCOUNTER — Ambulatory Visit (INDEPENDENT_AMBULATORY_CARE_PROVIDER_SITE_OTHER): Payer: BC Managed Care – PPO | Admitting: Family Medicine

## 2011-10-02 ENCOUNTER — Encounter: Payer: Self-pay | Admitting: Family Medicine

## 2011-10-02 DIAGNOSIS — R609 Edema, unspecified: Secondary | ICD-10-CM

## 2011-10-02 DIAGNOSIS — I10 Essential (primary) hypertension: Secondary | ICD-10-CM

## 2011-10-02 DIAGNOSIS — R21 Rash and other nonspecific skin eruption: Secondary | ICD-10-CM

## 2011-10-02 DIAGNOSIS — E119 Type 2 diabetes mellitus without complications: Secondary | ICD-10-CM

## 2011-10-02 MED ORDER — TRIAMCINOLONE ACETONIDE 0.1 % EX CREA: TOPICAL_CREAM | Freq: Two times a day (BID) | CUTANEOUS | Status: DC

## 2011-10-02 NOTE — Patient Instructions (Signed)
Call in one week if rash no better

## 2011-10-02 NOTE — Progress Notes (Signed)
  Subjective:    Patient ID: Steven Ward, male    DOB: 25-Oct-1924, 78 y.o.   MRN: [id number]  HPI  Patient seen for medical followup. Refer to chart note. He has history of type 2 diabetes hyperlipidemia, hypertension. Recent problems with leg edema. We discontinued Actos. Fasting blood sugars remain around 97. Recent A1c 6.5%. He has lost 4 pounds and edema much improved. No dyspnea.  Skin rash mostly lower legs. Patient thought this may be related to generic Actos. No change in rash since stopping medication. He is convinced this may be related to his pancreatic enzymes. No food allergy. Exacerbated by heat.  Past Medical History  Diagnosis Date  . DIABETES MELLITUS, TYPE II 12/01/2008  . ESSENTIAL HYPERTENSION 12/01/2008  . ALLERGIC RHINITIS 12/01/2008  . GERD 12/01/2008  . GALLSTONES 04/07/2009  . SPONDYLOSIS, LUMBAR 12/01/2008  . EDEMA LEG 05/04/2009  . ABNORMAL THYROID FUNCTION TESTS 01/17/2010  . COLONIC POLYPS, HX OF 12/01/2008  . Hay fever   . Colon cancer   . Exocrine pancreatic insufficiency    Past Surgical History  Procedure Date  . Colon surgery     resection 1990 for cancer    reports that he quit smoking about 42 years ago. His smoking use included Cigarettes. He has a 20 pack-year smoking history. He does not have any smokeless tobacco history on file. He reports that he does not drink alcohol. His drug history not on file. family history includes Cancer in his other; Diabetes in his other; and Stroke in his other. Allergies  Allergen Reactions  . Amoxicillin     REACTION: rash, dizziness      Review of Systems  Constitutional: Negative for fever and chills.  Respiratory: Negative for cough and shortness of breath.   Cardiovascular: Negative for chest pain, palpitations and leg swelling.  Gastrointestinal: Negative for abdominal pain.  Skin: Positive for rash.  Neurological: Negative for syncope and weakness.  Hematological: Negative for adenopathy.         Objective:   Physical Exam  Constitutional: He appears well-developed and well-nourished. No distress.  Cardiovascular: Normal rate and regular rhythm.   Pulmonary/Chest: Effort normal and breath sounds normal. No respiratory distress. He has no wheezes. He has no rales.  Musculoskeletal:       Only trace edema legs bilaterally much improved  Skin:       Nonspecific erythematous slightly raised slightly scaly rash lower legs bilaterally. No pustules.          Assessment & Plan:  #1 leg edema improved off Actos #2 type 2 diabetes stable thus far but is aware that his blood sugars may still elevate somewhat off Actos. Recheck A1c in 3 months #3 hypertension stable #4 skin rash which is nonspecific lower legs. Not compatible with necrobiosis lipoidica diabeticorum. Triamcinolone 0.1% cream twice a day. Doubt drug related

## 2011-10-26 ENCOUNTER — Ambulatory Visit: Payer: BC Managed Care – PPO | Admitting: Family Medicine

## 2011-11-14 ENCOUNTER — Ambulatory Visit (INDEPENDENT_AMBULATORY_CARE_PROVIDER_SITE_OTHER): Payer: BC Managed Care – PPO | Admitting: Family Medicine

## 2011-11-14 ENCOUNTER — Encounter: Payer: Self-pay | Admitting: Family Medicine

## 2011-11-14 VITALS — BP 112/62 | Temp 98.3°F | Wt 228.0 lb

## 2011-11-14 DIAGNOSIS — R21 Rash and other nonspecific skin eruption: Secondary | ICD-10-CM

## 2011-11-14 MED ORDER — TRIAMCINOLONE ACETONIDE 0.1 % EX CREA: TOPICAL_CREAM | Freq: Two times a day (BID) | CUTANEOUS | Status: DC

## 2011-11-14 NOTE — Progress Notes (Signed)
  Subjective:    Patient ID: Steven Ward, male    DOB: 1925-02-21, 78 y.o.   MRN: [id number]  HPI  Patient still with pruritic rash. Mostly back and lower extremities. Skin is very dry. Pruritic. No alleviating factors. He has previously used steroid creams but not for this rash. Itching is worse with warm water exposure. Diabetes relatively stable. Last A1c 6.5% we discontinued his Actos at that time this was 3 months ago. He is due for repeat A1c in about one month.   Review of Systems  Constitutional: Negative for fever and chills.  Skin: Positive for rash.       Objective:   Physical Exam  Constitutional: He appears well-developed and well-nourished. No distress.  Cardiovascular: Normal rate and regular rhythm.   Pulmonary/Chest: Effort normal and breath sounds normal. No respiratory distress. He has no wheezes. He has no rales.  Musculoskeletal: He exhibits no edema.  Skin:       Patient has scaly dry rash scattered irregular distribution back mostly upper back and lower extremities. No pustules. No vesicles.          Assessment & Plan:  Skin rash. Eczematous. Get back on triamcinolone cream and combined moisturizer such as Lac-Hydrin.  Reassess at follow up in one month

## 2011-11-14 NOTE — Patient Instructions (Signed)
Avoid prolonged bathing. Apply steroid cream twice daily and especially after bathing. Avoid scratching as much as possible. Consider moisturizer such as Lac Hydrin cream

## 2011-11-29 ENCOUNTER — Other Ambulatory Visit: Payer: Self-pay | Admitting: Family Medicine

## 2011-11-30 LAB — HM DIABETES EYE EXAM

## 2011-12-01 ENCOUNTER — Encounter: Payer: Self-pay | Admitting: Family Medicine

## 2011-12-02 ENCOUNTER — Other Ambulatory Visit: Payer: Self-pay | Admitting: Family Medicine

## 2012-01-02 ENCOUNTER — Ambulatory Visit (INDEPENDENT_AMBULATORY_CARE_PROVIDER_SITE_OTHER): Payer: BC Managed Care – PPO | Admitting: Family Medicine

## 2012-01-02 ENCOUNTER — Encounter: Payer: Self-pay | Admitting: Family Medicine

## 2012-01-02 VITALS — BP 130/70 | Temp 98.3°F | Wt 224.0 lb

## 2012-01-02 DIAGNOSIS — E119 Type 2 diabetes mellitus without complications: Secondary | ICD-10-CM

## 2012-01-02 DIAGNOSIS — R21 Rash and other nonspecific skin eruption: Secondary | ICD-10-CM

## 2012-01-02 DIAGNOSIS — I10 Essential (primary) hypertension: Secondary | ICD-10-CM

## 2012-01-02 LAB — BASIC METABOLIC PANEL
BUN: 19 mg/dL (ref 6–23)
CO2: 28 mEq/L (ref 19–32)
Calcium: 8.7 mg/dL (ref 8.4–10.5)
Chloride: 105 mEq/L (ref 96–112)
Creatinine, Ser: 1.2 mg/dL (ref 0.4–1.5)

## 2012-01-02 LAB — LIPID PANEL: VLDL: 19.2 mg/dL (ref 0.0–40.0)

## 2012-01-02 MED ORDER — TRIAMCINOLONE ACETONIDE 0.1 % EX CREA: TOPICAL_CREAM | Freq: Two times a day (BID) | CUTANEOUS | Status: DC

## 2012-01-02 MED ORDER — GLYBURIDE 5 MG PO TABS: 5.0000 mg | ORAL_TABLET | Freq: Every day | ORAL | Status: DC

## 2012-01-02 NOTE — Progress Notes (Signed)
  Subjective:    Patient ID: Steven Ward, male    DOB: January 01, 1925, 78 y.o.   MRN: [id number]  HPI  Medical followup. Type 2 diabetes and hypertension. Compliant with medications. No hypoglycemia. We have stopped his Actos several months ago he has not had any followup A1c since then. He has had somewhat less leg edema since stopping this. Previously been on metformin but had loose stools and also recent creatinine 1.5.  Hypertension treated with HCTZ, benazepril, and amlodipine. No headaches. No dizziness. No syncope. Denies any recent chest pain.  Recent eye exam. History of cataracts and probable upcoming surgery soon.  Pruritic skin rash from last visit improving with steroid cream. Past Medical History  Diagnosis Date  . DIABETES MELLITUS, TYPE II 12/01/2008  . ESSENTIAL HYPERTENSION 12/01/2008  . ALLERGIC RHINITIS 12/01/2008  . GERD 12/01/2008  . GALLSTONES 04/07/2009  . SPONDYLOSIS, LUMBAR 12/01/2008  . EDEMA LEG 05/04/2009  . ABNORMAL THYROID FUNCTION TESTS 01/17/2010  . COLONIC POLYPS, HX OF 12/01/2008  . Hay fever   . Colon cancer   . Exocrine pancreatic insufficiency    Past Surgical History  Procedure Date  . Colon surgery     resection 1990 for cancer    reports that he quit smoking about 43 years ago. His smoking use included Cigarettes. He has a 20 pack-year smoking history. He does not have any smokeless tobacco history on file. He reports that he does not drink alcohol. His drug history not on file. family history includes Cancer in his other; Diabetes in his other; and Stroke in his other. Allergies  Allergen Reactions  . Amoxicillin     REACTION: rash, dizziness      Review of Systems  Constitutional: Negative for fatigue.  Eyes: Negative for visual disturbance.  Respiratory: Negative for cough, chest tightness and shortness of breath.   Cardiovascular: Negative for chest pain, palpitations and leg swelling.  Neurological: Negative for dizziness, syncope,  weakness, light-headedness and headaches.       Objective:   Physical Exam  Constitutional: He is oriented to person, place, and time. He appears well-developed and well-nourished.  HENT:  Mouth/Throat: Oropharynx is clear and moist.  Neck: Neck supple. No thyromegaly present.  Cardiovascular: Normal rate and regular rhythm.   Pulmonary/Chest: Effort normal and breath sounds normal. No respiratory distress. He has no wheezes. He has no rales.  Musculoskeletal: He exhibits edema.       Only trace edema feet and legs bilaterally Feet reveal no skin lesions. Good distal foot pulses. Good capillary refill. No calluses. Normal sensation with monofilament testing   Neurological: He is alert and oriented to person, place, and time. No cranial nerve deficit.          Assessment & Plan:  #1 type 2 diabetes. History of excellent control. Recheck A1c, especially off Actos. Continue regular eye exams.  Recheck lipids though LDL has been consistently below 100 in the past #2 hypertension stable continue weight loss efforts. Recheck basic metabolic panel #3 skin rash-eczema/dry skin lower leg improved with steroid cream.

## 2012-01-09 NOTE — Progress Notes (Signed)
Quick Note:  Copy mailed to pt home with instructions highlighted ______ 

## 2012-03-05 ENCOUNTER — Other Ambulatory Visit: Payer: Self-pay | Admitting: *Deleted

## 2012-03-05 MED ORDER — GLUCOSE BLOOD VI STRP: ORAL_STRIP | Status: DC

## 2012-04-10 ENCOUNTER — Encounter: Payer: Self-pay | Admitting: Family Medicine

## 2012-04-10 ENCOUNTER — Ambulatory Visit (INDEPENDENT_AMBULATORY_CARE_PROVIDER_SITE_OTHER): Payer: BC Managed Care – PPO | Admitting: Family Medicine

## 2012-04-10 VITALS — BP 110/60 | Temp 98.0°F | Wt 216.0 lb

## 2012-04-10 DIAGNOSIS — E119 Type 2 diabetes mellitus without complications: Secondary | ICD-10-CM

## 2012-04-10 DIAGNOSIS — Z23 Encounter for immunization: Secondary | ICD-10-CM

## 2012-04-10 LAB — HEMOGLOBIN A1C: Hgb A1c MFr Bld: 6.6 % — ABNORMAL HIGH (ref 4.6–6.5)

## 2012-04-10 MED ORDER — ZOSTER VACCINE LIVE 19400 UNT/0.65ML ~~LOC~~ SOLR: 0.6500 mL | Freq: Once | SUBCUTANEOUS | Status: DC

## 2012-04-10 NOTE — Progress Notes (Signed)
  Subjective:    Patient ID: Steven Ward, male    DOB: [birth year], 78 y.o.   MRN: [id number]  HPI  Type 2 diabetes followup. Patient made great changes in diet since last visit. He has reduced pasta and bread intake. Exercising with cycling 30 minutes everyday of the week. Has lost 8 pounds. Recent A1c 7.5%. Patient takes glyburide and Januvia. No hypoglycemia. Overall feels well. No chest pains. Recent lipids at goal. He has history of low HDL. Nonsmoker.  Immunizations up-to-date with exception of no history of shingles vaccine. He is not sure regarding coverage. Recently diagnosed with cataracts. Probable eye surgery soon for that. Appointment with ophthalmologist tomorrow  Past Medical History  Diagnosis Date  . DIABETES MELLITUS, TYPE II 12/01/2008  . ESSENTIAL HYPERTENSION 12/01/2008  . ALLERGIC RHINITIS 12/01/2008  . GERD 12/01/2008  . GALLSTONES 04/07/2009  . SPONDYLOSIS, LUMBAR 12/01/2008  . EDEMA LEG 05/04/2009  . ABNORMAL THYROID FUNCTION TESTS 01/17/2010  . COLONIC POLYPS, HX OF 12/01/2008  . Hay fever   . Colon cancer   . Exocrine pancreatic insufficiency    Past Surgical History  Procedure Date  . Colon surgery     resection 1990 for cancer    reports that he quit smoking about 43 years ago. His smoking use included Cigarettes. He has a 20 pack-year smoking history. He does not have any smokeless tobacco history on file. He reports that he does not drink alcohol. His drug history not on file. family history includes Cancer in his other; Diabetes in his other; and Stroke in his other. Allergies  Allergen Reactions  . Amoxicillin     REACTION: rash, dizziness      Review of Systems  Constitutional: Negative for fever, activity change, appetite change and unexpected weight change.  Respiratory: Negative for cough and shortness of breath.   Cardiovascular: Negative for chest pain and leg swelling.  Gastrointestinal: Negative for vomiting and abdominal pain.    Genitourinary: Negative for dysuria.       Objective:   Physical Exam  Constitutional: He appears well-developed and well-nourished.  HENT:  Mouth/Throat: Oropharynx is clear and moist.  Neck: Neck supple.  Cardiovascular: Normal rate and regular rhythm.   Pulmonary/Chest: Effort normal and breath sounds normal. No respiratory distress. He has no wheezes. He has no rales.  Musculoskeletal: He exhibits no edema.          Assessment & Plan:  Type 2 diabetes. Most recent A1c 7.5%. Recheck A1c today. Check on coverage for shingles vaccine. Routine followup 3-4 months

## 2012-04-11 NOTE — Progress Notes (Signed)
Quick Note:  Pt informed ______ 

## 2012-05-03 ENCOUNTER — Ambulatory Visit: Payer: BC Managed Care – PPO | Admitting: Family Medicine

## 2012-08-09 ENCOUNTER — Encounter: Payer: Self-pay | Admitting: Family Medicine

## 2012-08-09 ENCOUNTER — Ambulatory Visit (INDEPENDENT_AMBULATORY_CARE_PROVIDER_SITE_OTHER): Payer: BC Managed Care – PPO | Admitting: Family Medicine

## 2012-08-09 VITALS — BP 130/70 | Temp 97.6°F | Wt 220.0 lb

## 2012-08-09 DIAGNOSIS — E119 Type 2 diabetes mellitus without complications: Secondary | ICD-10-CM

## 2012-08-09 LAB — HEMOGLOBIN A1C: Hgb A1c MFr Bld: 6.8 % — ABNORMAL HIGH (ref 4.6–6.5)

## 2012-08-09 MED ORDER — TRIAMCINOLONE ACETONIDE 0.1 % EX CREA: TOPICAL_CREAM | Freq: Two times a day (BID) | CUTANEOUS | Status: DC

## 2012-08-09 NOTE — Progress Notes (Signed)
Subjective:     Patient ID: Steven Ward, male   DOB: 1925-04-07, 78 y.o.   MRN: [id number]  HPI 20-month diabetes follow-up.  Patient notes that he has gained about 4 pounds since last visit, which he attributes to less food portion control lately, but denies much change to the contents of his diet.  Still limiting pasta and [place] food and meats, eating lots of vegetables.  Exercises daily at the gym, 30 minutes of cycling per day.  Checks sugars twice daily, AM fasting BS 80s-130, PM BS 130-135.  Still tolerating glyburide and Januvia well with no s/e or hypoglycemia.    Checks BPs occasionally, notes readings in the 120s/60s-70s.  Taking amlodipine, benazepril, and HCTZ.  No episodes of lightheadedness, HA, visual disturbances, CP, or SOB.  Also has cataracts and glaucoma diagnosed this year, managed by ophthalmologist.  Takes 2 different kinds of drops for glaucoma, does not remember their names.  3 drops of a 0.10% solution in R eye, 1 drop of 0.01% solution in L eye.    Pruritic rash on left lower extremity and over lumbosacral area stable from previous visits, managed with triamcinolone cream.  Requesting refill today.  Review of Systems  Constitutional: Negative for fever and chills.  Eyes: Negative for visual disturbance.  Respiratory: Negative for chest tightness and shortness of breath.   Cardiovascular: Negative for chest pain.  Gastrointestinal: Negative for nausea and vomiting.       Objective:   Physical Exam  Constitutional: He appears well-developed and well-nourished. No distress.  Cardiovascular: Normal rate, regular rhythm and normal heart sounds.   Pulmonary/Chest: Effort normal and breath sounds normal. No respiratory distress.       Assessment:     78 year old male here for 83-month diabetes follow-up.    Plan:     1. Diabetes: currently well-controlled with diet, exercise, glyburide and Januvia.  Continue current medications and encourage portion  control.  Re-check A1C today and follow-up again in 4 months.  Marthann Schiller, MS3     Agree with assessment and plan as per Marthann Schiller, MS 3 Pt has had previous intolerance with metformin (even low dose) and thus medication regimen as above. Evelena Peat MD

## 2012-08-27 ENCOUNTER — Other Ambulatory Visit: Payer: Self-pay | Admitting: Family Medicine

## 2012-10-08 ENCOUNTER — Other Ambulatory Visit: Payer: Self-pay | Admitting: Family Medicine

## 2012-11-14 ENCOUNTER — Other Ambulatory Visit: Payer: Self-pay | Admitting: *Deleted

## 2012-11-14 MED ORDER — GLUCOSE BLOOD VI STRP: ORAL_STRIP | Status: AC

## 2012-11-26 ENCOUNTER — Telehealth: Payer: Self-pay | Admitting: Family Medicine

## 2012-11-26 NOTE — Telephone Encounter (Signed)
Pt needs new script for diabetic supplies. Pt's previous company has gone out of busines. glucose blood test strip New meter and battery Solution Lancets Pt needs entire package for testing. Pharm: [facility] Pt talked to [employer] this am and they advised pt to call also. [phone]

## 2012-11-26 NOTE — Telephone Encounter (Signed)
I'm going to wait for faxed request, unsure what meter [facility] will cover

## 2012-11-28 NOTE — Telephone Encounter (Signed)
Pt informed fax was received and filled out and faxed to [facility]

## 2012-12-06 ENCOUNTER — Ambulatory Visit: Payer: BC Managed Care – PPO | Admitting: Family Medicine

## 2012-12-14 ENCOUNTER — Other Ambulatory Visit: Payer: Self-pay | Admitting: Family Medicine

## 2012-12-18 ENCOUNTER — Other Ambulatory Visit: Payer: Self-pay | Admitting: Family Medicine

## 2013-01-06 ENCOUNTER — Ambulatory Visit (INDEPENDENT_AMBULATORY_CARE_PROVIDER_SITE_OTHER): Payer: BC Managed Care – PPO | Admitting: Family Medicine

## 2013-01-06 ENCOUNTER — Encounter: Payer: Self-pay | Admitting: Family Medicine

## 2013-01-06 VITALS — BP 110/70 | Temp 98.7°F | Wt 223.0 lb

## 2013-01-06 DIAGNOSIS — M47817 Spondylosis without myelopathy or radiculopathy, lumbosacral region: Secondary | ICD-10-CM

## 2013-01-06 DIAGNOSIS — I10 Essential (primary) hypertension: Secondary | ICD-10-CM

## 2013-01-06 DIAGNOSIS — E119 Type 2 diabetes mellitus without complications: Secondary | ICD-10-CM

## 2013-01-06 MED ORDER — CELECOXIB 200 MG PO CAPS: 200.0000 mg | ORAL_CAPSULE | Freq: Every day | ORAL | Status: DC

## 2013-01-06 NOTE — Patient Instructions (Addendum)
[facility] to [facility] if [facility] was done Try to lose some weight.

## 2013-01-06 NOTE — Progress Notes (Signed)
  Subjective:    Patient ID: Steven Ward, male    DOB: [birth year], 79 y.o.   MRN: [id number]  HPI Patient here for medical followup. Recently had some lab work at the [place] and apparently had hemoglobin A1c 7.2%. Blood sugars remained well controlled by home readings. He remains on Januvia and glyburide. No recent hypoglycemia. He has eye exam scheduled in one week  He's had some recent low back pain. Taken Celebrex in the past and requesting refill. He has pain that is lower lumbar area radiates somewhat into both buttocks. Slightly worse with walking. No weakness. No urine or stool incontinence.  Patient has history of low HDL but excellent LDL. He currently does not taking a statin. He's not sure if lipids were done with recent labs through the [place].  Past Medical History  Diagnosis Date  . DIABETES MELLITUS, TYPE II 12/01/2008  . ESSENTIAL HYPERTENSION 12/01/2008  . ALLERGIC RHINITIS 12/01/2008  . GERD 12/01/2008  . GALLSTONES 04/07/2009  . SPONDYLOSIS, LUMBAR 12/01/2008  . EDEMA LEG 05/04/2009  . ABNORMAL THYROID FUNCTION TESTS 01/17/2010  . COLONIC POLYPS, HX OF 12/01/2008  . Hay fever   . Colon cancer   . Exocrine pancreatic insufficiency    Past Surgical History  Procedure Laterality Date  . Colon surgery      resection 1990 for cancer    reports that he quit smoking about 44 years ago. His smoking use included Cigarettes. He has a 20 pack-year smoking history. He does not have any smokeless tobacco history on file. He reports that he does not drink alcohol. His drug history is not on file. family history includes Cancer in his other; Diabetes in his other; and Stroke in his other. Allergies  Allergen Reactions  . Amoxicillin     REACTION: rash, dizziness      Review of Systems  Constitutional: Negative for fatigue.  Eyes: Negative for visual disturbance.  Respiratory: Negative for cough, chest tightness and shortness of breath.   Cardiovascular: Negative for chest pain,  palpitations and leg swelling.  Musculoskeletal: Positive for back pain.  Neurological: Negative for dizziness, syncope, weakness, light-headedness and headaches.       Objective:   Physical Exam  Constitutional: He appears well-developed and well-nourished. No distress.  HENT:  Minimal cerumen in both ear canals  Neck: Neck supple. No thyromegaly present.  Cardiovascular: Normal rate and regular rhythm.   Pulmonary/Chest: Effort normal and breath sounds normal. No respiratory distress. He has no wheezes. He has no rales.  Musculoskeletal: He exhibits no edema.  Lymphadenopathy:    He has no cervical adenopathy.          Assessment & Plan:  #1 type 2 diabetes. History of good control. Try to lose some weight. Reassess 6 months #2 history of dyslipidemia. History of low HDL but good LDL. Patient reluctant to consider further medications. Try get copies of recent lab work through the [place]. #3 lumbar back pain. Suspect degenerative arthritis. Refill Celebrex for as needed use. Try to avoid regular use

## 2013-02-06 ENCOUNTER — Ambulatory Visit: Payer: BC Managed Care – PPO | Admitting: Family Medicine

## 2013-03-19 IMAGING — CR DG CHEST 2V
2 series · 2 of 2 positions shown · non-contrast
Comparison: 04/26/2009

CLINICAL DATA: Short of breath

CHEST - 2 VIEW

[view not recorded (1 of 2)]
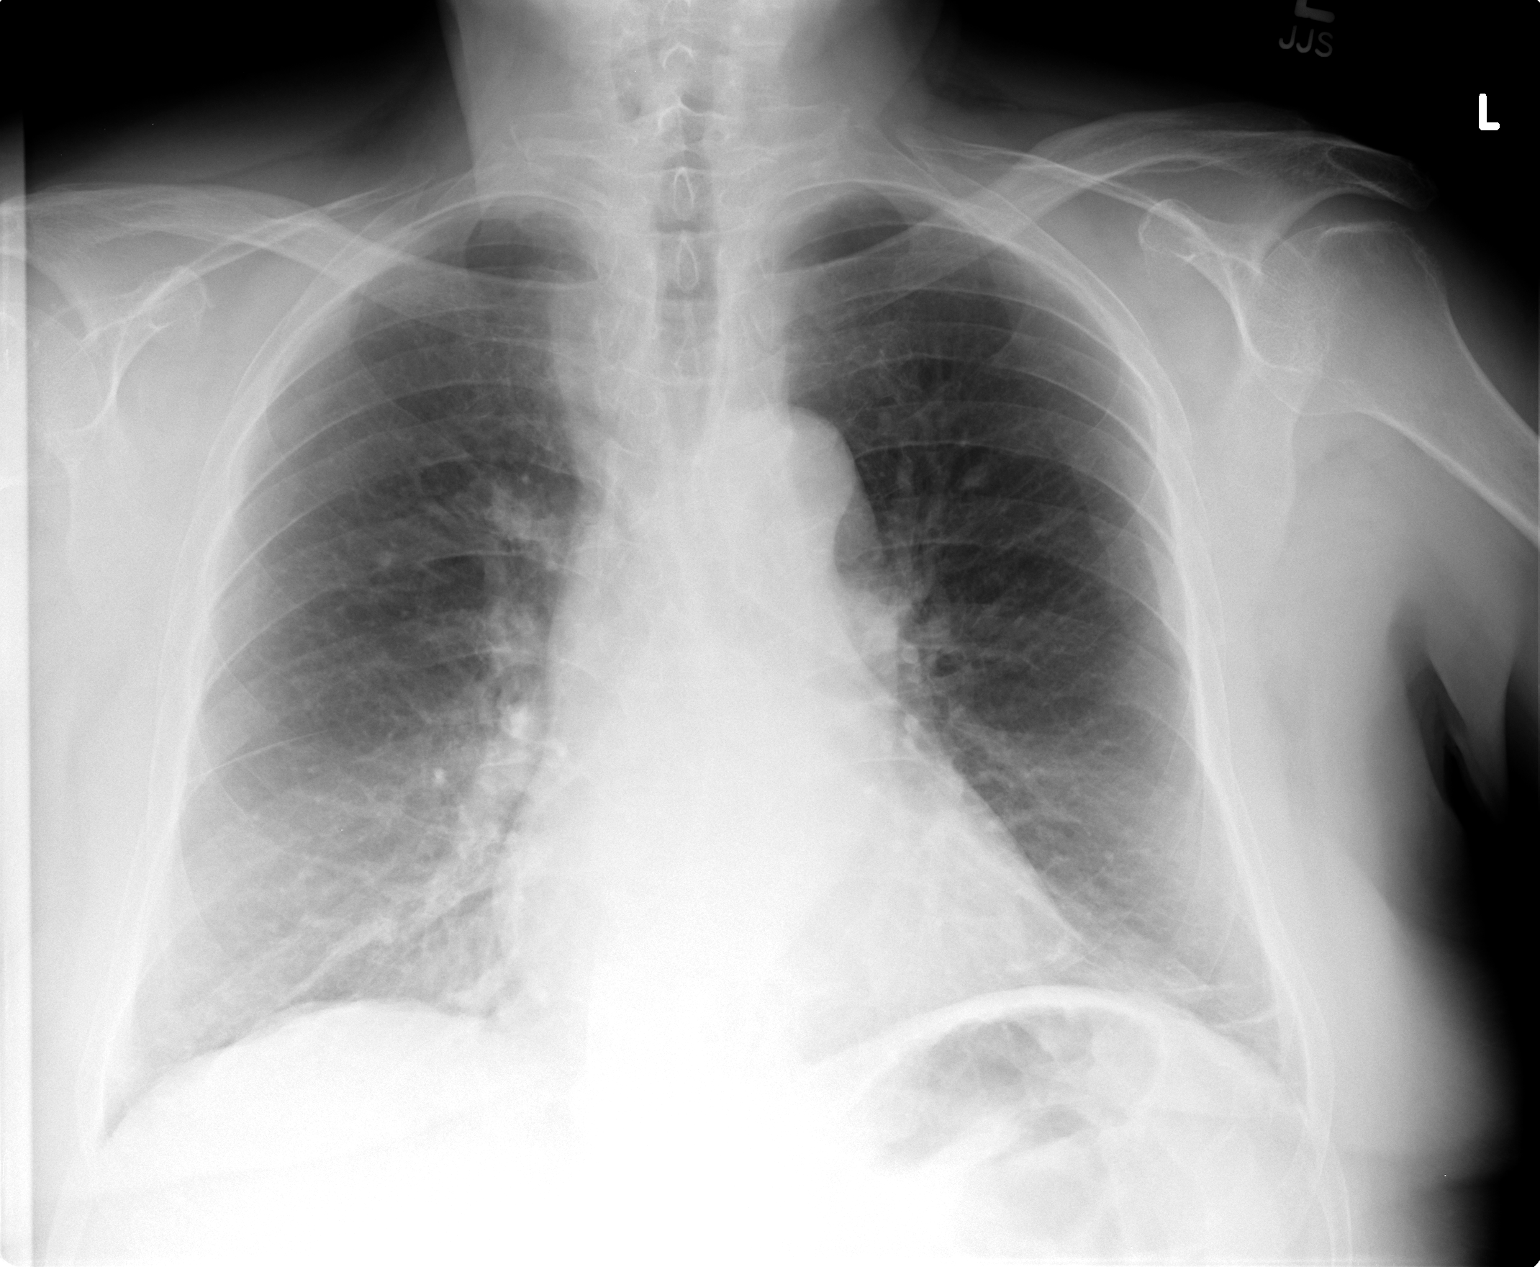

[view not recorded (2 of 2)]
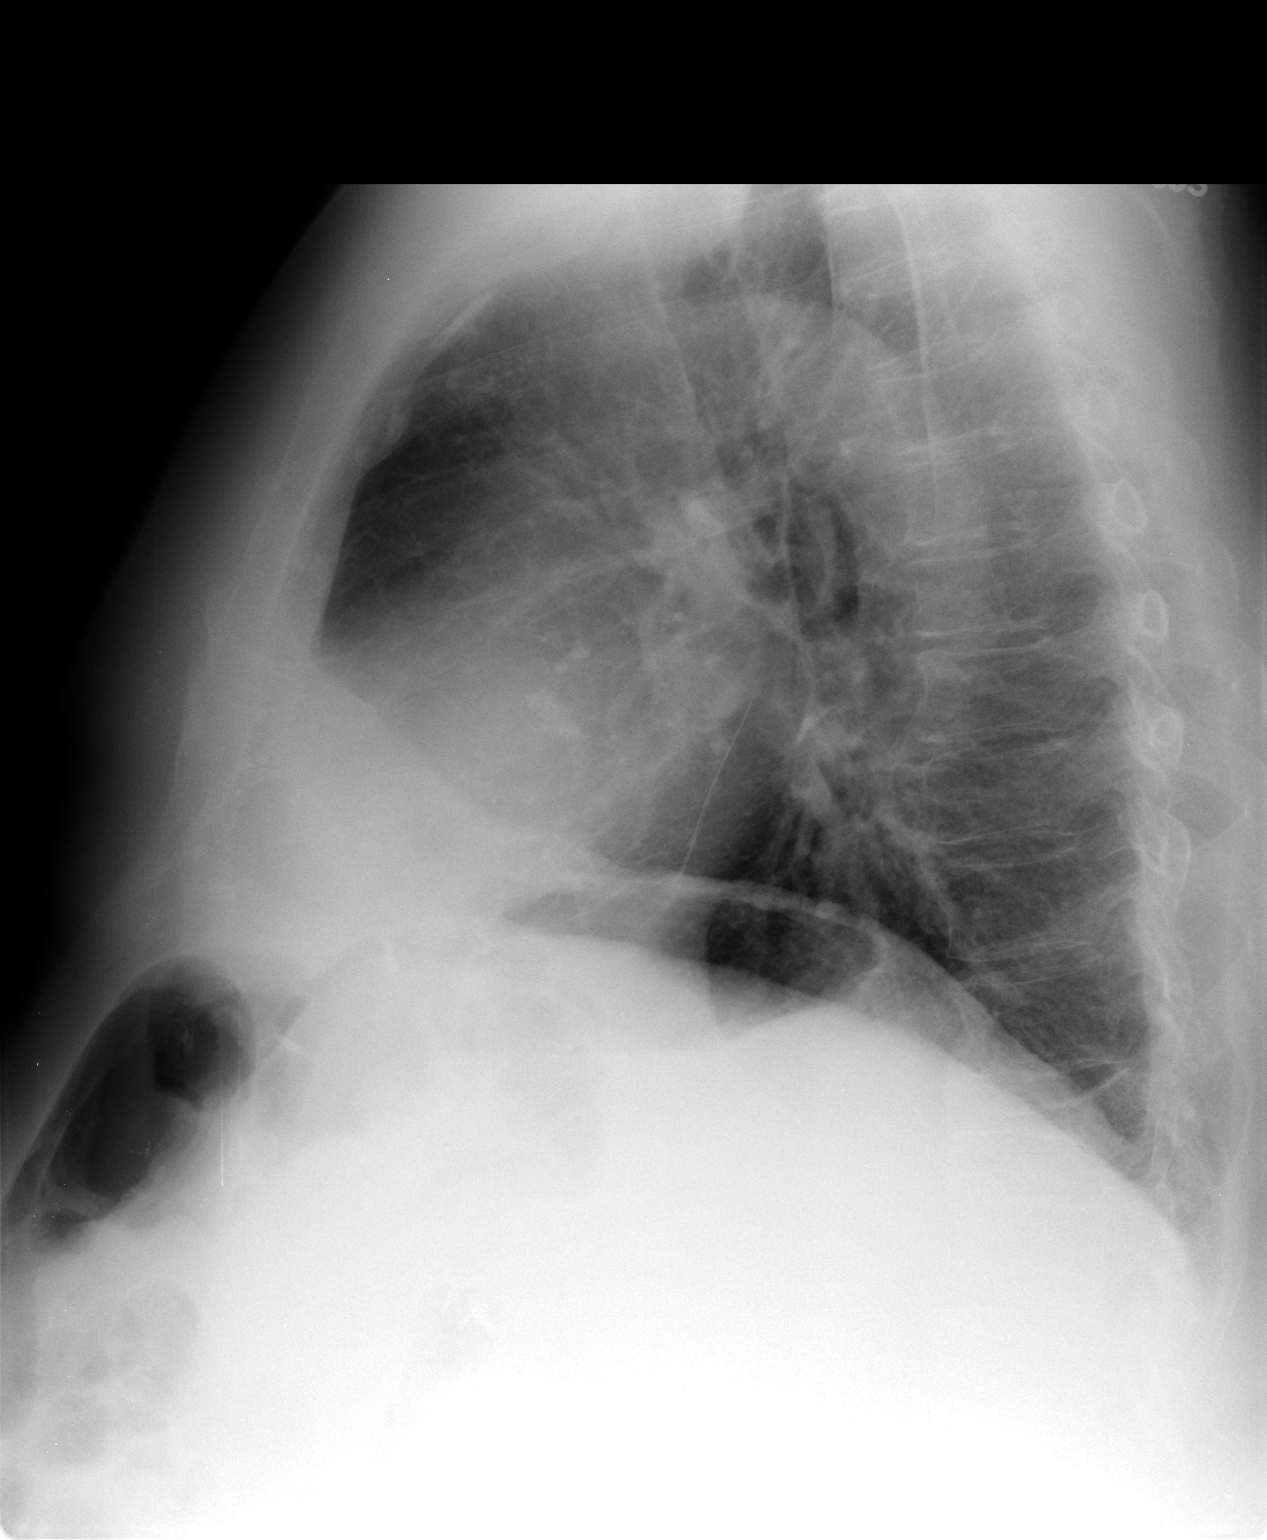

[2 of 2 positions shown; findings below may reference images not displayed]

FINDINGS: Mild atelectasis in the lung bases, increased from prior
study.  Negative for heart failure or effusion.  Negative for mass
lesion or pneumonia.

Severe chronic fracture approximately T12 is unchanged.
IMPRESSION: Increase in bibasilar atelectasis.

## 2013-04-01 LAB — HM DIABETES EYE EXAM

## 2013-04-21 ENCOUNTER — Ambulatory Visit (INDEPENDENT_AMBULATORY_CARE_PROVIDER_SITE_OTHER): Payer: BC Managed Care – PPO | Admitting: Family Medicine

## 2013-04-21 ENCOUNTER — Encounter: Payer: Self-pay | Admitting: Family Medicine

## 2013-04-21 VITALS — BP 128/68 | HR 75 | Temp 98.6°F | Wt 216.0 lb

## 2013-04-21 DIAGNOSIS — H8110 Benign paroxysmal vertigo, unspecified ear: Secondary | ICD-10-CM

## 2013-04-21 DIAGNOSIS — R5381 Other malaise: Secondary | ICD-10-CM

## 2013-04-21 DIAGNOSIS — E669 Obesity, unspecified: Secondary | ICD-10-CM | POA: Insufficient documentation

## 2013-04-21 DIAGNOSIS — E119 Type 2 diabetes mellitus without complications: Secondary | ICD-10-CM

## 2013-04-21 DIAGNOSIS — I10 Essential (primary) hypertension: Secondary | ICD-10-CM

## 2013-04-21 DIAGNOSIS — R5383 Other fatigue: Secondary | ICD-10-CM

## 2013-04-21 LAB — LIPID PANEL
HDL: 37.2 mg/dL — ABNORMAL LOW (ref >39.00)
Triglycerides: 68 mg/dL (ref 0.0–149.0)

## 2013-04-21 LAB — CBC WITH DIFFERENTIAL/PLATELET
Basophils Absolute: 0 10*3/uL (ref 0.0–0.1)
Eosinophils Absolute: 0.1 10*3/uL (ref 0.0–0.7)
Lymphocytes Relative: 16.4 % (ref 12.0–46.0)
MCHC: 33.6 g/dL (ref 30.0–36.0)
Neutrophils Relative %: 75.2 % (ref 43.0–77.0)
RBC: 4.29 Mil/uL (ref 4.22–5.81)
RDW: 14.1 % (ref 11.5–14.6)

## 2013-04-21 LAB — BASIC METABOLIC PANEL
Calcium: 9.1 mg/dL (ref 8.4–10.5)
Chloride: 104 mEq/L (ref 96–112)
Creatinine, Ser: 1.4 mg/dL (ref 0.4–1.5)

## 2013-04-21 NOTE — Progress Notes (Signed)
  Subjective:    Patient ID: Steven Ward, male    DOB: 04/09/1925, 79 y.o.   MRN: [id number]  HPI  Patient seen with the following issues  Recent transient vertigo which lasted about 10 minutes yesterday. Denies any orthostatic symptoms. No syncope.  Has nonspecific symptoms recently of fatigue and possibly some decreased appetite. No chest pains. He has type 2 diabetes and hypertension. He is also followed through the [place] [facility]. No recent hypoglycemia. Blood sugars have been stable. Last A1c 7.2%. Previous intolerance with metformin with diarrhea. Had history of pancreatic insufficiency and has remained on Creon supplement.  He's lost about 7 pounds since last visit but he states is due to his efforts with some dietary changes.   Past Medical History  Diagnosis Date  . DIABETES MELLITUS, TYPE II 12/01/2008  . ESSENTIAL HYPERTENSION 12/01/2008  . ALLERGIC RHINITIS 12/01/2008  . GERD 12/01/2008  . GALLSTONES 04/07/2009  . SPONDYLOSIS, LUMBAR 12/01/2008  . EDEMA LEG 05/04/2009  . ABNORMAL THYROID FUNCTION TESTS 01/17/2010  . COLONIC POLYPS, HX OF 12/01/2008  . Hay fever   . Colon cancer   . Exocrine pancreatic insufficiency    Past Surgical History  Procedure Laterality Date  . Colon surgery      resection 1990 for cancer    reports that he quit smoking about 44 years ago. His smoking use included Cigarettes. He has a 20 pack-year smoking history. He does not have any smokeless tobacco history on file. He reports that he does not drink alcohol. His drug history is not on file. family history includes Cancer in his other; Diabetes in his other; and Stroke in his other. Allergies  Allergen Reactions  . Amoxicillin     REACTION: rash, dizziness     Review of Systems  Constitutional: Positive for fatigue.  HENT: Negative for trouble swallowing.   Eyes: Negative for visual disturbance.  Respiratory: Negative for cough, chest tightness and shortness of breath.    Cardiovascular: Negative for chest pain, palpitations and leg swelling.  Gastrointestinal: Negative for abdominal pain.  Endocrine: Negative for polydipsia and polyuria.  Genitourinary: Negative for dysuria and hematuria.  Neurological: Positive for dizziness. Negative for seizures, syncope, weakness, light-headedness and headaches.  Hematological: Negative for adenopathy.  Psychiatric/Behavioral: Negative for dysphoric mood.       Objective:   Physical Exam  Constitutional: He is oriented to person, place, and time. He appears well-developed and well-nourished.  HENT:  Mouth/Throat: Oropharynx is clear and moist.  Minimal cerumen both canals  Neck: Neck supple. No thyromegaly present.  No carotid bruits  Cardiovascular: Normal rate and regular rhythm.   Pulmonary/Chest: Effort normal and breath sounds normal. No respiratory distress. He has no wheezes. He has no rales.  Musculoskeletal: He exhibits no edema.  Neurological: He is alert and oriented to person, place, and time. No cranial nerve deficit.  Gait normal. No focal strength deficits Cerebellar function normal by finger to nose testing  Psychiatric: He has a normal mood and affect. His behavior is normal.          Assessment & Plan:  #1 transient vertigo. Suspect benign positional vertigo. Asymptomatic at this time.  Non focal exam.  No red flags for worrisome causes vertigo.  Reassurance. Follow up for any new or worsening symptoms. #2 type 2 diabetes. Recheck A1c. Check lipid panel. Continue regular eye exams #3 hypertension. Stable #4 fatigue. Check labs above including basic metabolic panel, TSH, CBC

## 2013-04-21 NOTE — Patient Instructions (Addendum)

## 2013-05-07 ENCOUNTER — Ambulatory Visit: Payer: BC Managed Care – PPO | Admitting: Family Medicine

## 2013-05-29 ENCOUNTER — Encounter: Payer: Self-pay | Admitting: Family Medicine

## 2013-05-29 ENCOUNTER — Ambulatory Visit (INDEPENDENT_AMBULATORY_CARE_PROVIDER_SITE_OTHER)
Admission: RE | Admit: 2013-05-29 | Discharge: 2013-05-29 | Disposition: A | Discharge status: Home / Self Care | Payer: Medicare Other | Source: Ambulatory Visit | Attending: Family Medicine | Admitting: Family Medicine

## 2013-05-29 ENCOUNTER — Ambulatory Visit (INDEPENDENT_AMBULATORY_CARE_PROVIDER_SITE_OTHER): Payer: Medicare Other | Admitting: Family Medicine

## 2013-05-29 VITALS — BP 118/66 | HR 72 | Temp 97.7°F | Wt 216.0 lb

## 2013-05-29 DIAGNOSIS — M545 Low back pain, unspecified: Secondary | ICD-10-CM

## 2013-05-29 DIAGNOSIS — Z23 Encounter for immunization: Secondary | ICD-10-CM

## 2013-05-29 NOTE — Progress Notes (Signed)
  Subjective:    Patient ID: Steven Ward, male    DOB: 06/01/1925, 79 y.o.   MRN: [id number]  HPI Patient seen with new complaint of bilateral leg pain with ambulation. He is also describing some dull achy pain in his lumbar spine. His symptoms are improved with sitting and worse with walking. He is not describing any claudication symptoms. His pain radiates from the lumbar area all the way down toward the right and left legs. He has not noted any color changes or temperature changes involving the feet.  Denies any recent back injury. No appetite or weight changes. No fever or chills. No dysuria. No abdominal pain. Symptoms are consistently worse with activity such as walking. No alleviating factors. Symptoms are moderate in severity. Alleviated by rest and sitting  Lab Results  Component Value Date   HGBA1C 7.1* 04/21/2013     Past Medical History  Diagnosis Date  . DIABETES MELLITUS, TYPE II 12/01/2008  . ESSENTIAL HYPERTENSION 12/01/2008  . ALLERGIC RHINITIS 12/01/2008  . GERD 12/01/2008  . GALLSTONES 04/07/2009  . SPONDYLOSIS, LUMBAR 12/01/2008  . EDEMA LEG 05/04/2009  . ABNORMAL THYROID FUNCTION TESTS 01/17/2010  . COLONIC POLYPS, HX OF 12/01/2008  . Hay fever   . Colon cancer   . Exocrine pancreatic insufficiency    Past Surgical History  Procedure Laterality Date  . Colon surgery      resection 1990 for cancer    reports that he quit smoking about 44 years ago. His smoking use included Cigarettes. He has a 20 pack-year smoking history. He does not have any smokeless tobacco history on file. He reports that he does not drink alcohol. His drug history is not on file. family history includes Cancer in his other; Diabetes in his other; Stroke in his other. Allergies  Allergen Reactions  . Amoxicillin     REACTION: rash, dizziness      Review of Systems  Constitutional: Negative for fever, chills, appetite change and unexpected weight change.  Respiratory: Negative for  shortness of breath.   Cardiovascular: Negative for chest pain.  Gastrointestinal: Negative for abdominal pain.  Genitourinary: Negative for dysuria and hematuria.  Musculoskeletal: Positive for back pain.  Neurological: Negative for weakness and numbness.  Hematological: Negative for adenopathy.       Objective:   Physical Exam  Constitutional: He appears well-developed and well-nourished.  Cardiovascular: Normal rate and regular rhythm.   Pulmonary/Chest: Effort normal and breath sounds normal. No respiratory distress. He has no wheezes. He has no rales.  Musculoskeletal: He exhibits no edema.  Straight leg raises are negative. Both feet are warm to touch. 2+ dorsalis pedis pulses bilaterally. Good capillary refill bilaterally.  Neurological:  Patient's full-strength lower extremities. Only trace reflexes knee and ankle bilaterally. No sensory impairment          Assessment & Plan:  Patient presents with several month history of progressive lumbar back pain with bilateral radiculopathy symptoms. Symptoms are worse with ambulation. No clinical evidence for claudication. Suspect lumbar stenosis. Start with plain x-rays lumbar spine. May need MRI scan to further assess. Consider trial of tramadol as needed for pain. Flu vaccine given  He is not describing any diffuse myalgias typical of polymyalgia rheumatica

## 2013-06-05 ENCOUNTER — Other Ambulatory Visit: Payer: Self-pay | Admitting: Family Medicine

## 2013-07-07 ENCOUNTER — Ambulatory Visit: Payer: BC Managed Care – PPO | Admitting: Family Medicine

## 2013-07-22 ENCOUNTER — Ambulatory Visit: Payer: BC Managed Care – PPO | Admitting: Family Medicine

## 2013-07-23 ENCOUNTER — Other Ambulatory Visit: Payer: Self-pay | Admitting: Family Medicine

## 2013-07-25 ENCOUNTER — Ambulatory Visit (INDEPENDENT_AMBULATORY_CARE_PROVIDER_SITE_OTHER): Payer: Medicare Other | Admitting: Family Medicine

## 2013-07-25 ENCOUNTER — Encounter: Payer: Self-pay | Admitting: Family Medicine

## 2013-07-25 ENCOUNTER — Other Ambulatory Visit: Payer: Self-pay

## 2013-07-25 VITALS — BP 128/68 | HR 73 | Temp 98.0°F | Wt 218.0 lb

## 2013-07-25 DIAGNOSIS — M47817 Spondylosis without myelopathy or radiculopathy, lumbosacral region: Secondary | ICD-10-CM

## 2013-07-25 MED ORDER — SITAGLIPTIN PHOSPHATE 100 MG PO TABS: ORAL_TABLET | ORAL | Status: DC

## 2013-07-25 MED ORDER — TRAMADOL HCL 50 MG PO TABS: ORAL_TABLET | ORAL | Status: DC

## 2013-07-25 NOTE — Patient Instructions (Signed)
Spinal Stenosis  Spinal stenosis is an abnormal narrowing of the canals of your spine (vertebrae).  CAUSES   Spinal stenosis is caused by areas of bone pushing into the central canals of your vertebrae. This condition can be present at birth (congenital). It also may be caused by arthritic deterioration of your vertebrae (spinal degeneration).   SYMPTOMS   · Pain that is generally worse with activities, particularly standing and walking.  · Numbness, tingling, hot or cold sensations, weakness, or weariness in your legs.  · Frequent episodes of falling.  · A foot-slapping gait that leads to muscle weakness.  DIAGNOSIS   Spinal stenosis is diagnosed with the use of magnetic resonance imaging (MRI) or computed tomography (CT).  TREATMENT   Initial therapy for spinal stenosis focuses on the management of the pain and other symptoms associated with the condition. These therapies include:  · Practicing postural changes to lessen pressure on your nerves.  · Exercises to strengthen the core of your body.  · Loss of excess body weight.  · The use of nonsteroidal anti-inflammatory medications to reduce swelling and inflammation in your nerves.  When therapies to manage pain are not successful, surgery to treat spinal stenosis may be recommended. This surgery involves removing excess bone, which puts pressure on your nerve roots. During this surgery (laminectomy), the posterior boney arch (lamina) and excess bone around the facet joints are removed.  Document Released: 11/25/2003 Document Revised: 12/30/2012 Document Reviewed: 12/13/2012  [employer]® Patient Information ©2014 [employer].

## 2013-07-25 NOTE — Progress Notes (Signed)
  Subjective:    Patient ID: Steven Ward, male    DOB: [birth year], 79 y.o.   MRN: [id number]  HPI Patient seen for followup regarding lumbar back pain with bilateral radiation into buttocks and lower extremities. Refer to prior note. We sent for plain x-rays which confirmed multilevel degenerative spondylosis. His pain is no better and possibly slightly worse. He has already made appointment himself with orthopedic specialist in [place]. He has not had any acute symptoms such as loss of bladder or bowel control or lower extremity weakness or numbness. His pain is moderate and worse with ambulation and improved at rest. He has not taken any pain medications per se for this. He has taken Celebrex in the past.  Diabetes which has been followed recently per [facility]. He had recent hemoglobin A1c last week but does not have results yet. Last A1c here 7.1%. Recent home blood sugars have been slightly elevated  Past Medical History  Diagnosis Date  . DIABETES MELLITUS, TYPE II 12/01/2008  . ESSENTIAL HYPERTENSION 12/01/2008  . ALLERGIC RHINITIS 12/01/2008  . GERD 12/01/2008  . GALLSTONES 04/07/2009  . SPONDYLOSIS, LUMBAR 12/01/2008  . EDEMA LEG 05/04/2009  . ABNORMAL THYROID FUNCTION TESTS 01/17/2010  . COLONIC POLYPS, HX OF 12/01/2008  . Hay fever   . Colon cancer   . Exocrine pancreatic insufficiency    Past Surgical History  Procedure Laterality Date  . Colon surgery      resection 1990 for cancer    reports that he quit smoking about 44 years ago. His smoking use included Cigarettes. He has a 20 pack-year smoking history. He does not have any smokeless tobacco history on file. He reports that he does not drink alcohol. His drug history is not on file. family history includes Cancer in his other; Diabetes in his other; Stroke in his other. Allergies  Allergen Reactions  . Amoxicillin     REACTION: rash, dizziness      Review of Systems  Constitutional: Negative for  fever, activity change, appetite change and unexpected weight change.  Respiratory: Negative for cough and shortness of breath.   Cardiovascular: Negative for chest pain and leg swelling.  Gastrointestinal: Negative for vomiting and abdominal pain.  Endocrine: Negative for polydipsia and polyuria.  Genitourinary: Negative for dysuria, hematuria and flank pain.  Musculoskeletal: Positive for back pain. Negative for joint swelling.  Neurological: Negative for weakness and numbness.       Objective:   Physical Exam  Constitutional: He appears well-developed and well-nourished.  Cardiovascular: Normal rate and regular rhythm.   Pulmonary/Chest: Effort normal and breath sounds normal. No respiratory distress. He has no wheezes. He has no rales.  Musculoskeletal:  Straight leg raises are negative bilaterally  Neurological:  Full-strength lower extremities          Assessment & Plan:  #1 lumbar back pain. Lumbar stenosis. Nonfocal neurologic exam. Pt already has appointment with orthopedic specialist. Trial of tramadol 50 mg one to 2 every 6 hours as needed for pain #90 with one refill #2 type 2 diabetes. Patient to acquire labs from recent [place] visit and have forwarded to [place]. Routine followup 3 months

## 2013-10-16 ENCOUNTER — Telehealth: Payer: Self-pay | Admitting: Family Medicine

## 2013-10-16 NOTE — Telephone Encounter (Signed)
He has already seen them and HE should be able to set up.  If he needs referral let me know.

## 2013-10-16 NOTE — Telephone Encounter (Signed)
Pt needs a referral to see Dr. Oletta Lamas for abnormal stool color. (GI)

## 2013-10-17 NOTE — Telephone Encounter (Signed)
Pt has already seen Dr. Oletta Lamas before. Informed pt that he can call and set up his own appt without a referral since he has seen Dr. Oletta Lamas before.

## 2013-10-24 ENCOUNTER — Encounter: Payer: Self-pay | Admitting: Family Medicine

## 2013-10-24 ENCOUNTER — Ambulatory Visit (INDEPENDENT_AMBULATORY_CARE_PROVIDER_SITE_OTHER): Payer: Medicare HMO | Admitting: Family Medicine

## 2013-10-24 VITALS — BP 130/70 | HR 80 | Temp 98.4°F | Wt 215.0 lb

## 2013-10-24 DIAGNOSIS — I10 Essential (primary) hypertension: Secondary | ICD-10-CM

## 2013-10-24 DIAGNOSIS — K5900 Constipation, unspecified: Secondary | ICD-10-CM

## 2013-10-24 DIAGNOSIS — E119 Type 2 diabetes mellitus without complications: Secondary | ICD-10-CM

## 2013-10-24 DIAGNOSIS — M47817 Spondylosis without myelopathy or radiculopathy, lumbosacral region: Secondary | ICD-10-CM

## 2013-10-24 MED ORDER — SITAGLIPTIN PHOSPHATE 100 MG PO TABS: ORAL_TABLET | ORAL | Status: DC

## 2013-10-24 NOTE — Patient Instructions (Signed)

## 2013-10-24 NOTE — Progress Notes (Signed)
Pre visit review using our clinic review tool, if applicable. No additional management support is needed unless otherwise documented below in the visit note. 

## 2013-10-24 NOTE — Progress Notes (Signed)
Subjective:    Patient ID: Steven Ward, male    DOB: [birth year], 80 y.o.   MRN: [id number]  HPI Patient seen for multiple issues as follows  Type 2 diabetes. Followed at [facility]. Last A1c 7.1% in November. He plans to be followed by them for that. Fasting blood sugars usually around 140. He remains on Januvia and glyburide. No recent hypoglycemia. His lipids have been extremely good and he has not take any statins. He has declined these in the past.  Persistent low back pain which is worse with walking. He has known spondylosis and has tried things like Celebrex and Ultram without relief. He would like to see neurosurgeon at this time. He has names of local neurosurgeons and plan set up appointment. Is not having any urine or stool incontinence and no progressive weakness. His pain is lower lumbar and worse with ambulation. He does not describe any claudication symptoms.  Constipation. Not taking anticholinergics. Inadequate water intake. He's tried MiraLax without much improvement. Generally has stool about every other day but has to strain frequently. Walking is limited because of his back issues.  Hypertension which has been stable. He remains on amlodipine, HCTZ, and benazepril.  Past Medical History  Diagnosis Date  . DIABETES MELLITUS, TYPE II 12/01/2008  . ESSENTIAL HYPERTENSION 12/01/2008  . ALLERGIC RHINITIS 12/01/2008  . GERD 12/01/2008  . GALLSTONES 04/07/2009  . SPONDYLOSIS, LUMBAR 12/01/2008  . EDEMA LEG 05/04/2009  . ABNORMAL THYROID FUNCTION TESTS 01/17/2010  . COLONIC POLYPS, HX OF 12/01/2008  . Hay fever   . Colon cancer   . Exocrine pancreatic insufficiency    Past Surgical History  Procedure Laterality Date  . Colon surgery      resection 1990 for cancer    reports that he quit smoking about 44 years ago. His smoking use included Cigarettes. He has a 20 pack-year smoking history. He does not have any smokeless tobacco history on file. He reports that he  does not drink alcohol. His drug history is not on file. family history includes Cancer in his other; Diabetes in his other; Stroke in his other. Allergies  Allergen Reactions  . Amoxicillin     REACTION: rash, dizziness      Review of Systems  Constitutional: Negative for fatigue.  Eyes: Negative for visual disturbance.  Respiratory: Negative for cough, chest tightness and shortness of breath.   Cardiovascular: Negative for chest pain, palpitations and leg swelling.  Endocrine: Negative for polydipsia and polyuria.  Musculoskeletal: Positive for back pain.  Neurological: Negative for dizziness, syncope, weakness, light-headedness, numbness and headaches.       Objective:   Physical Exam  Constitutional: He appears well-developed and well-nourished.  Cardiovascular: Normal rate.   Pulmonary/Chest: Effort normal and breath sounds normal. No respiratory distress. He has no wheezes. He has no rales.  Musculoskeletal: He exhibits no edema.  Neurological: He is alert.  Skin:  Feet are dry. He has a couple of very small calluses along the ball of both feet. No lesions. Normal sensory function          Assessment & Plan:  #1 type 2 diabetes. History of good control. He'll continue followup labs at the [facility]. He plans to be seen there in couple months. Refill Januvia for one year #2 constipation. We discussed measures to reduce. He'll continue as needed MiraLax and try daily stool softener. Increased water consumption #3 hypertension well controlled. Continue current medications #4 progressive low back pain  with lumbar spondylosis. He plans to see neurosurgeon soon.

## 2013-10-27 ENCOUNTER — Telehealth: Payer: Self-pay | Admitting: Family Medicine

## 2013-10-27 NOTE — Telephone Encounter (Signed)
Relevant patient education mailed to patient.  

## 2013-11-05 ENCOUNTER — Encounter: Payer: Self-pay | Admitting: Family Medicine

## 2013-11-05 ENCOUNTER — Ambulatory Visit (INDEPENDENT_AMBULATORY_CARE_PROVIDER_SITE_OTHER): Payer: Medicare HMO | Admitting: Family Medicine

## 2013-11-05 VITALS — BP 130/64 | HR 79 | Wt 209.0 lb

## 2013-11-05 DIAGNOSIS — R209 Unspecified disturbances of skin sensation: Secondary | ICD-10-CM

## 2013-11-05 DIAGNOSIS — R39198 Other difficulties with micturition: Secondary | ICD-10-CM

## 2013-11-05 DIAGNOSIS — R202 Paresthesia of skin: Secondary | ICD-10-CM

## 2013-11-05 LAB — POCT URINALYSIS DIPSTICK
Bilirubin, UA: NEGATIVE
Glucose, UA: NEGATIVE
Leukocytes, UA: NEGATIVE
Nitrite, UA: NEGATIVE
PH UA: 5.5
Protein, UA: NEGATIVE
RBC UA: NEGATIVE
SPEC GRAV UA: 1.02
Urobilinogen, UA: 0.2

## 2013-11-05 MED ORDER — TAMSULOSIN HCL 0.4 MG PO CAPS: 0.4000 mg | ORAL_CAPSULE | Freq: Every day | ORAL | Status: DC

## 2013-11-05 NOTE — Progress Notes (Signed)
   Subjective:    Patient ID: Steven Ward, male    DOB: Jun 24, 1925, 80 y.o.   MRN: [id number]  HPI Patient seen for the following issues  Acute issue of left hand numbness. More specifically, he had mostly second and third digit numbness intermittently and dysesthesias. Onset about 3 or 4 days ago possibly longer but especially over the past few days. He denies any cervical neck pain. No weakness. No clear exacerbating factors. No alleviating factors. He complains that his hand freely feels stiff.  Patient also complains of slow urinary stream over the past several days. He is not taking any anti-cholinergics. Denies any burning with urination. No decongestant use. Denies any fever or chills. Occasional nocturia.  He is not describing any urinary urgency.  Past Medical History  Diagnosis Date  . DIABETES MELLITUS, TYPE II 12/01/2008  . ESSENTIAL HYPERTENSION 12/01/2008  . ALLERGIC RHINITIS 12/01/2008  . GERD 12/01/2008  . GALLSTONES 04/07/2009  . SPONDYLOSIS, LUMBAR 12/01/2008  . EDEMA LEG 05/04/2009  . ABNORMAL THYROID FUNCTION TESTS 01/17/2010  . COLONIC POLYPS, HX OF 12/01/2008  . Hay fever   . Colon cancer   . Exocrine pancreatic insufficiency    Past Surgical History  Procedure Laterality Date  . Colon surgery      resection 1990 for cancer    reports that he quit smoking about 44 years ago. His smoking use included Cigarettes. He has a 20 pack-year smoking history. He does not have any smokeless tobacco history on file. He reports that he does not drink alcohol. His drug history is not on file. family history includes Cancer in his other; Diabetes in his other; Stroke in his other. Allergies  Allergen Reactions  . Amoxicillin     REACTION: rash, dizziness      Review of Systems  Constitutional: Negative for fever and chills.  Respiratory: Negative for shortness of breath.   Cardiovascular: Negative for chest pain.  Gastrointestinal: Negative for abdominal pain.    Genitourinary: Positive for decreased urine volume. Negative for urgency and hematuria.  Neurological: Positive for numbness. Negative for weakness.       Objective:   Physical Exam  Constitutional: He appears well-developed and well-nourished.  Cardiovascular: Normal rate and regular rhythm.   Pulmonary/Chest: Effort normal and breath sounds normal. No respiratory distress. He has no wheezes. He has no rales.  Genitourinary: Rectum normal and prostate normal.  Musculoskeletal: He exhibits no edema.  Radial and ulnar pulses are normal bilaterally. Good capillary refill. Both hands are warm to touch. No muscle atrophy. Tinel sign is negative.  Neurological:  No focal weakness upper extremities. Symmetric reflexes.          Assessment & Plan:  #1 intermittent dysesthesias and paresthesias involving left index and third digits predominantly. Question carpal tunnel. No clear risk factors other than diabetes. Consider wrist brace #2 slow urine stream. Suspect BPH. Check urinalysis. Consider trial Flomax 0.4 mg each bedtime. Reassess 2 weeks.

## 2013-11-05 NOTE — Patient Instructions (Signed)
Carpal Tunnel Syndrome Carpal tunnel syndrome is a disorder of the nervous system in the wrist that causes pain, hand weakness, and/or loss of feeling. Carpal tunnel syndrome is caused by the compression, stretching, or irritation of the median nerve at the wrist joint. Athletes who experience carpal tunnel syndrome may notice a decrease in their performance to the condition, especially for sports that require strong hand or wrist action.  SYMPTOMS   Tingling, numbness, or burning pain in the hand or fingers.  Inability to sleep due to pain in the hand.  Sharp pains that shoot from the wrist up the arm or to the fingers, especially at night.  Morning stiffness or cramping of the hand.  Thumb weakness, resulting in difficulty holding objects or making a fist.  Shiny, dry skin on the hand.  Reduced performance in any sport requiring a strong grip. CAUSES   Median nerve damage at the wrist is caused by pressure due to swelling, inflammation, or scarred tissue.  Sources of pressure include:  Repetitive gripping or squeezing that causes inflammation of the tendon sheaths.  Scarring or shortening of the ligament that covers the median nerve.  Traumatic injury to the wrist or forearm such as fracture, sprain, or dislocation.  Prolonged hyperextension (wrist bent backward) or hyperflexion (wrist bent downward) of the wrist. RISK INCREASES WITH:  Diabetes mellitus.  Menopause or amenorrhea.  Rheumatoid arthritis.  Raynaud's disease.  Pregnancy.  Gout.  Kidney disease.  Ganglion cyst.  Repetitive hand or wrist action.  Hypothyroidism (underactive thyroid gland).  Repetitive jolting or shaking of the hands or wrist.  Prolonged forceful weight-bearing on the hands. PREVENTION  Bracing the hand and wrist straight during activities that involve repetitive grasping.  For activities that require prolonged extension of the wrist (bending towards the top of the forearm)  periodically change the position of your wrists.  Learn and use proper technique in activities that result in the wrist position in neutral to slight extension.  Avoid bending the wrist into full extension or flexion (up or down)  Keep the wrist in a straight (neutral) position. To keep the wrist in this position, wear a splint.  Avoid repetitive hand and wrist motions.  When possible avoid prolonged grasping of items (steering wheel of a car, a pen, a vacuum cleaner, or a rake).  Loosen your grip for activities that require prolonged grasping of items.  Place keyboards and writing surfaces at the correct height as to decrease strain on the wrist and hand.  Alternate work tasks to avoid prolonged wrist flexion.  Avoid pinching activities (needlework and writing) as they may irritate your carpal tunnel syndrome.  If these activities are necessary, complete them for shorter periods of time.  When writing, use a felt tip or roller ball pen and/or build up the grip on a pen to decrease the forces required for writing. PROGNOSIS  Carpal tunnel syndrome is usually curable with appropriate conservative treatment and sometimes resolves spontaneously. For some cases, surgery is necessary, especially if muscle wasting or nerve changes have developed.  RELATED COMPLICATIONS   Permanent numbness and a weak thumb or fingers in the affected hand.  Permanent paralysis of a portion of the hand and fingers. TREATMENT  Treatment initially consists of stopping activities that aggravate the symptoms as well as medication and ice to reduce inflammation. A wrist splint is often recommended for wear during activities of repetitive motion as well as at night. It is also important to learn and use proper technique  when performing activities that typically cause pain. On occasion, a corticosteroid injection may be given. If symptoms persist despite conservative treatment, surgery may be an option. Surgical  techniques free the pinched or compressed nerve. Carpal tunnel surgery is usually performed on an outpatient basis, meaning you go home the same day as surgery. These procedures provide almost complete relief of all symptoms in 95% of patients. Expect at least 2 weeks for healing after surgery. For cases that are the result of repeated jolting or shaking of the hand or wrist or prolonged hyperextension, surgery is not usually recommended, because stretching of the median nerve and not compression are usually the cause of carpal tunnel syndrome in these cases. MEDICATION   If pain medication is necessary, nonsteroidal anti-inflammatory medications, such as aspirin and ibuprofen, or other minor pain relievers, such as acetaminophen, are often recommended.  Do not take pain medication for 7 days before surgery.  Prescription pain relievers are usually only prescribed after surgery. Use only as directed and only as much as you need.  Corticosteroid injections may be given to reduce inflammation. However, they are not always recommended.  Vitamin B6 (pyridoxine) may reduce symptoms; use only if prescribed for your disorder. SEEK MEDICAL CARE IF:   Symptoms get worse or do not improve in 2 weeks despite treatment.  You also have a current or recent history of neck or shoulder injury that has resulted in pain or tingling elsewhere in your arm. Document Released: 09/04/2005 Document Revised: 12/30/2012 Document Reviewed: 12/17/2008 [facility] Patient Information 2014 [place], [place].

## 2013-11-05 NOTE — Progress Notes (Signed)
Pre visit review using our clinic review tool, if applicable. No additional management support is needed unless otherwise documented below in the visit note. 

## 2013-11-10 ENCOUNTER — Ambulatory Visit (INDEPENDENT_AMBULATORY_CARE_PROVIDER_SITE_OTHER): Payer: Medicare HMO | Admitting: Family Medicine

## 2013-11-10 ENCOUNTER — Encounter: Payer: Self-pay | Admitting: Family Medicine

## 2013-11-10 VITALS — BP 128/68 | HR 90 | Temp 98.4°F | Wt 209.0 lb

## 2013-11-10 DIAGNOSIS — K5900 Constipation, unspecified: Secondary | ICD-10-CM

## 2013-11-10 DIAGNOSIS — IMO0002 Reserved for concepts with insufficient information to code with codable children: Secondary | ICD-10-CM

## 2013-11-10 DIAGNOSIS — M5416 Radiculopathy, lumbar region: Secondary | ICD-10-CM

## 2013-11-10 DIAGNOSIS — N400 Enlarged prostate without lower urinary tract symptoms: Secondary | ICD-10-CM

## 2013-11-10 NOTE — Progress Notes (Signed)
Pre visit review using our clinic review tool, if applicable. No additional management support is needed unless otherwise documented below in the visit note. 

## 2013-11-10 NOTE — Patient Instructions (Signed)
We will call you regarding orthopedic consult for your low back pain.

## 2013-11-10 NOTE — Progress Notes (Signed)
   Subjective:    Patient ID: Steven Ward, male    DOB: [birth year], 80 y.o.   MRN: [id number]  Diabetes Associated symptoms include fatigue. Pertinent negatives for diabetes include no chest pain and no weakness.   Patient seen for the following He has nonspecific symptoms of weakness and malaise. He's had low-volume bowel movements but is having some bowel movement most days. He takes MiraLax several days per week. He's not had any bloody stools. Recent prostate exam did not reveal impaction.  BPH symptoms and just recently started Flomax. He is having less nocturia and decreased slow stream and overall feels he has had improvement.  His major complaint is lumbar back pain with bilateral lumbar radiculopathy symptoms. Symptoms are consistently worse with walking and consistently improved with rest and sitting. He denies any significant numbness. Recent plain films lumbar spine revealed degenerative changes and old compression fracture T12. Denies any fever or chills. No appetite change. No weight changes.  Past Medical History  Diagnosis Date  . DIABETES MELLITUS, TYPE II 12/01/2008  . ESSENTIAL HYPERTENSION 12/01/2008  . ALLERGIC RHINITIS 12/01/2008  . GERD 12/01/2008  . GALLSTONES 04/07/2009  . SPONDYLOSIS, LUMBAR 12/01/2008  . EDEMA LEG 05/04/2009  . ABNORMAL THYROID FUNCTION TESTS 01/17/2010  . COLONIC POLYPS, HX OF 12/01/2008  . Hay fever   . Colon cancer   . Exocrine pancreatic insufficiency    Past Surgical History  Procedure Laterality Date  . Colon surgery      resection 1990 for cancer    reports that he quit smoking about 44 years ago. His smoking use included Cigarettes. He has a 20 pack-year smoking history. He does not have any smokeless tobacco history on file. He reports that he does not drink alcohol. His drug history is not on file. family history includes Cancer in his other; Diabetes in his other; Stroke in his other. Allergies  Allergen Reactions  . Amoxicillin       REACTION: rash, dizziness      Review of Systems  Constitutional: Positive for fatigue. Negative for fever, activity change, appetite change and unexpected weight change.  Respiratory: Negative for cough and shortness of breath.   Cardiovascular: Negative for chest pain and leg swelling.  Gastrointestinal: Negative for vomiting and abdominal pain.  Genitourinary: Negative for dysuria, hematuria and flank pain.  Musculoskeletal: Positive for back pain. Negative for joint swelling.  Neurological: Negative for weakness and numbness.       Objective:   Physical Exam  Constitutional: He appears well-developed and well-nourished.  Cardiovascular: Normal rate and regular rhythm.   Pulmonary/Chest: Effort normal and breath sounds normal. No respiratory distress. He has no wheezes. He has no rales.  Musculoskeletal: He exhibits no edema.  Neurological:  Straight leg raise are negative. He has full-strength with plantar flexion dorsiflexion. Trace reflex ankle and knee bilaterally          Assessment & Plan:  #1 lumbar back pain with bilateral radiculopathy symptoms exacerbated by walking. Suspect lumbar stenosis. Set up orthopedic referral #2 BPH symptomatically improved. Continue Flomax #3 constipation. No impaction by recent exam. Continue as needed MiraLax. Increase fluids.

## 2013-11-18 ENCOUNTER — Other Ambulatory Visit: Payer: Self-pay | Admitting: Family Medicine

## 2013-11-19 ENCOUNTER — Ambulatory Visit (INDEPENDENT_AMBULATORY_CARE_PROVIDER_SITE_OTHER): Payer: Medicare HMO | Admitting: Family Medicine

## 2013-11-19 ENCOUNTER — Encounter: Payer: Self-pay | Admitting: Family Medicine

## 2013-11-19 VITALS — BP 126/68 | HR 72 | Wt 211.0 lb

## 2013-11-19 DIAGNOSIS — IMO0002 Reserved for concepts with insufficient information to code with codable children: Secondary | ICD-10-CM

## 2013-11-19 DIAGNOSIS — M5416 Radiculopathy, lumbar region: Secondary | ICD-10-CM

## 2013-11-19 NOTE — Progress Notes (Signed)
Pre visit review using our clinic review tool, if applicable. No additional management support is needed unless otherwise documented below in the visit note. 

## 2013-11-19 NOTE — Progress Notes (Signed)
   Subjective:    Patient ID: Steven Ward, male    DOB: 1925-01-17, 80 y.o.   MRN: [id number]  HPI  Patient came back in today to discuss low back pain issues with bilateral lumbar radiculopathy symptoms. Refer to most recent note from 11/10/2013:  "His major complaint is lumbar back pain with bilateral lumbar radiculopathy symptoms. Symptoms are consistently worse with walking and consistently improved with rest and sitting. He denies any significant numbness. Recent plain films lumbar spine revealed degenerative changes and old compression fracture T12. Denies any fever or chills. No appetite change. No weight changes. "  We have made orthopedic referral and that is pending. Since last visit he is complaining of some bilateral calf numbness and possibly some mild weakness intermittently. His pain is slightly worsened. He's tried tramadol previously without much improvement.. He has been reluctant to try opioids because of constipation issues previously. No urine or stool incontinence. He continues to have bilateral radiculopathy symptoms are worse consistently with walking and improved with rest and sitting. He is not describing any claudication symptoms in recent pulses were normal  He has type 2 diabetes which is been well controlled  Past Medical History  Diagnosis Date  . DIABETES MELLITUS, TYPE II 12/01/2008  . ESSENTIAL HYPERTENSION 12/01/2008  . ALLERGIC RHINITIS 12/01/2008  . GERD 12/01/2008  . GALLSTONES 04/07/2009  . SPONDYLOSIS, LUMBAR 12/01/2008  . EDEMA LEG 05/04/2009  . ABNORMAL THYROID FUNCTION TESTS 01/17/2010  . COLONIC POLYPS, HX OF 12/01/2008  . Hay fever   . Colon cancer   . Exocrine pancreatic insufficiency    Past Surgical History  Procedure Laterality Date  . Colon surgery      resection 1990 for cancer    reports that he quit smoking about 44 years ago. His smoking use included Cigarettes. He has a 20 pack-year smoking history. He does not have any smokeless  tobacco history on file. He reports that he does not drink alcohol. His drug history is not on file. family history includes Cancer in his other; Diabetes in his other; Stroke in his other. Allergies  Allergen Reactions  . Amoxicillin     REACTION: rash, dizziness      Review of Systems  Constitutional: Negative for fever, chills and appetite change.  Respiratory: Negative for shortness of breath.   Cardiovascular: Negative for chest pain.  Gastrointestinal: Negative for abdominal pain.  Genitourinary: Negative for dysuria.  Musculoskeletal: Positive for back pain.  Neurological: Positive for weakness and numbness.  Hematological: Negative for adenopathy. Does not bruise/bleed easily.       Objective:   Physical Exam  Constitutional: He is oriented to person, place, and time. He appears well-developed and well-nourished.  Cardiovascular: Normal rate.   Pulmonary/Chest: Effort normal and breath sounds normal. No respiratory distress. He has no wheezes. He has no rales.  Musculoskeletal: He exhibits no edema.  Neurological: He is alert and oriented to person, place, and time.  Deep tendon reflexes are trace knee and ankle and symmetric No focal strength deficits          Assessment & Plan:  Lumbar back pain with bilateral radiculopathy symptoms. Suspect lumbar stenosis. He is describing new finding of some bilateral calf numbness. Orthopedic referral pending. Go ahead and obtain MRI lumbar spine

## 2013-11-26 ENCOUNTER — Ambulatory Visit
Admission: RE | Admit: 2013-11-26 | Discharge: 2013-11-26 | Disposition: A | Discharge status: Home / Self Care | Payer: Commercial Managed Care - HMO | Source: Ambulatory Visit | Attending: Family Medicine | Admitting: Family Medicine

## 2013-11-26 DIAGNOSIS — M5416 Radiculopathy, lumbar region: Secondary | ICD-10-CM

## 2013-12-23 ENCOUNTER — Telehealth: Payer: Self-pay | Admitting: Family Medicine

## 2013-12-23 NOTE — Telephone Encounter (Signed)
Pt states that [facility] advised him that he needs a referral to get the shots for his back, and stated he needed to contact his primary doctor.

## 2013-12-24 ENCOUNTER — Ambulatory Visit: Payer: Medicare HMO | Admitting: Family Medicine

## 2013-12-24 NOTE — Telephone Encounter (Signed)
Please clarify.  Are they recommending he be referred to another group?

## 2013-12-24 NOTE — Telephone Encounter (Signed)
Left message for patient to return call.

## 2013-12-25 ENCOUNTER — Ambulatory Visit (INDEPENDENT_AMBULATORY_CARE_PROVIDER_SITE_OTHER): Payer: Commercial Managed Care - HMO | Admitting: Family Medicine

## 2013-12-25 ENCOUNTER — Encounter: Payer: Self-pay | Admitting: Family Medicine

## 2013-12-25 VITALS — BP 130/68 | HR 70 | Temp 98.3°F | Wt 211.0 lb

## 2013-12-25 DIAGNOSIS — J069 Acute upper respiratory infection, unspecified: Secondary | ICD-10-CM

## 2013-12-25 DIAGNOSIS — E119 Type 2 diabetes mellitus without complications: Secondary | ICD-10-CM

## 2013-12-25 LAB — HEMOGLOBIN A1C: Hgb A1c MFr Bld: 7 % — ABNORMAL HIGH (ref 4.6–6.5)

## 2013-12-25 MED ORDER — AZITHROMYCIN 250 MG PO TABS: ORAL_TABLET | ORAL | Status: AC

## 2013-12-25 NOTE — Patient Instructions (Signed)

## 2013-12-25 NOTE — Telephone Encounter (Signed)
yes

## 2013-12-25 NOTE — Telephone Encounter (Signed)
[facility], caller stated that patient does not need a referral.

## 2013-12-25 NOTE — Progress Notes (Signed)
Pre visit review using our clinic review tool, if applicable. No additional management support is needed unless otherwise documented below in the visit note. 

## 2013-12-25 NOTE — Telephone Encounter (Signed)
No, they want to know is it okay for patient to have injections.

## 2013-12-25 NOTE — Progress Notes (Signed)
   Subjective:    Patient ID: Steven Ward, male    DOB: June 23, 1925, 80 y.o.   MRN: [id number]  Sinusitis Associated symptoms include congestion, coughing and sinus pressure. Pertinent negatives include no chills, shortness of breath or sore throat.   Patient is seen with approximately one-week history of sinus congestion, headache, cough, body aches. He is actually slightly better today than he was yesterday. Had some color to yellow to green nasal mucus. No wheezing.  He has type 2 diabetes. Last A1c 7.1%. CBG stable. No hypoglycemia. He has lost 2 pounds according to his scale since last visit. He continues to deal with lumbar stenosis and low back pain and is scheduled for epidural injections soon  Past Medical History  Diagnosis Date  . DIABETES MELLITUS, TYPE II 12/01/2008  . ESSENTIAL HYPERTENSION 12/01/2008  . ALLERGIC RHINITIS 12/01/2008  . GERD 12/01/2008  . GALLSTONES 04/07/2009  . SPONDYLOSIS, LUMBAR 12/01/2008  . EDEMA LEG 05/04/2009  . ABNORMAL THYROID FUNCTION TESTS 01/17/2010  . COLONIC POLYPS, HX OF 12/01/2008  . Hay fever   . Colon cancer   . Exocrine pancreatic insufficiency    Past Surgical History  Procedure Laterality Date  . Colon surgery      resection 1990 for cancer    reports that he quit smoking about 45 years ago. His smoking use included Cigarettes. He has a 20 pack-year smoking history. He does not have any smokeless tobacco history on file. He reports that he does not drink alcohol. His drug history is not on file. family history includes Cancer in his other; Diabetes in his other; Stroke in his other. Allergies  Allergen Reactions  . Amoxicillin     REACTION: rash, dizziness      Review of Systems  Constitutional: Negative for fever, chills, appetite change and unexpected weight change.  HENT: Positive for congestion and sinus pressure. Negative for sore throat.   Respiratory: Positive for cough. Negative for shortness of breath.          Objective:   Physical Exam  Constitutional: He is oriented to person, place, and time. He appears well-developed and well-nourished.  HENT:  Mouth/Throat: Oropharynx is clear and moist.  Moderate cerumen both canals  Neck: Neck supple.  Cardiovascular: Normal rate and regular rhythm.   Pulmonary/Chest: Effort normal and breath sounds normal. No respiratory distress. He has no wheezes. He has no rales.  Lymphadenopathy:    He has no cervical adenopathy.  Neurological: He is alert and oriented to person, place, and time.          Assessment & Plan:  #1 URI. Suspect viral. We've not recommended antibiotics at this time. We wrote prescription present to start only if he has any fever or worsening symptoms over the next week #2 type 2 diabetes. History of good control. Recheck A1c

## 2014-01-09 ENCOUNTER — Telehealth: Payer: Self-pay

## 2014-01-09 NOTE — Telephone Encounter (Signed)
Relevant patient education mailed to patient.  

## 2014-01-21 ENCOUNTER — Telehealth: Payer: Self-pay | Admitting: Family Medicine

## 2014-01-21 NOTE — Telephone Encounter (Signed)
Per Dr Jessy Oto MD office called and needed a referral faxed to [facility] scheduled for 01-22-2014  [facility],  [place], [place], [address] [phone] Faxed to [facility] awaiting authorization

## 2014-01-26 ENCOUNTER — Ambulatory Visit: Payer: Medicare HMO | Attending: Specialist | Admitting: Physical Therapy

## 2014-01-26 DIAGNOSIS — IMO0001 Reserved for inherently not codable concepts without codable children: Secondary | ICD-10-CM | POA: Diagnosis present

## 2014-01-26 DIAGNOSIS — R293 Abnormal posture: Secondary | ICD-10-CM | POA: Insufficient documentation

## 2014-01-26 DIAGNOSIS — M25659 Stiffness of unspecified hip, not elsewhere classified: Secondary | ICD-10-CM | POA: Diagnosis not present

## 2014-01-26 DIAGNOSIS — R5381 Other malaise: Secondary | ICD-10-CM | POA: Diagnosis not present

## 2014-01-26 DIAGNOSIS — M545 Low back pain, unspecified: Secondary | ICD-10-CM | POA: Insufficient documentation

## 2014-01-27 ENCOUNTER — Ambulatory Visit: Payer: Medicare HMO | Admitting: Family Medicine

## 2014-02-27 ENCOUNTER — Other Ambulatory Visit: Payer: Self-pay | Admitting: Family Medicine

## 2014-03-26 ENCOUNTER — Encounter: Payer: Self-pay | Admitting: Family Medicine

## 2014-03-26 ENCOUNTER — Ambulatory Visit (INDEPENDENT_AMBULATORY_CARE_PROVIDER_SITE_OTHER): Payer: Commercial Managed Care - HMO | Admitting: Family Medicine

## 2014-03-26 VITALS — BP 128/66 | HR 71 | Temp 97.9°F | Wt 211.0 lb

## 2014-03-26 DIAGNOSIS — E119 Type 2 diabetes mellitus without complications: Secondary | ICD-10-CM

## 2014-03-26 DIAGNOSIS — M47817 Spondylosis without myelopathy or radiculopathy, lumbosacral region: Secondary | ICD-10-CM

## 2014-03-26 DIAGNOSIS — N528 Other male erectile dysfunction: Secondary | ICD-10-CM

## 2014-03-26 DIAGNOSIS — N400 Enlarged prostate without lower urinary tract symptoms: Secondary | ICD-10-CM

## 2014-03-26 DIAGNOSIS — N529 Male erectile dysfunction, unspecified: Secondary | ICD-10-CM

## 2014-03-26 DIAGNOSIS — I10 Essential (primary) hypertension: Secondary | ICD-10-CM

## 2014-03-26 LAB — BASIC METABOLIC PANEL
BUN: 27 mg/dL — ABNORMAL HIGH (ref 6–23)
CO2: 24 meq/L (ref 19–32)
Calcium: 9.4 mg/dL (ref 8.4–10.5)
Chloride: 107 mEq/L (ref 96–112)
Creatinine, Ser: 1.3 mg/dL (ref 0.4–1.5)
GFR: 55.98 mL/min — ABNORMAL LOW (ref >60.00)
Glucose, Bld: 124 mg/dL — ABNORMAL HIGH (ref 70–99)
POTASSIUM: 4 meq/L (ref 3.5–5.1)
SODIUM: 139 meq/L (ref 135–145)

## 2014-03-26 LAB — LIPID PANEL
CHOLESTEROL: 109 mg/dL (ref 0–200)
HDL: 39.1 mg/dL (ref >39.00)
LDL Cholesterol: 58 mg/dL (ref 0–99)
NonHDL: 69.9
Total CHOL/HDL Ratio: 3
Triglycerides: 61 mg/dL (ref 0.0–149.0)
VLDL: 12.2 mg/dL (ref 0.0–40.0)

## 2014-03-26 LAB — HEPATIC FUNCTION PANEL
ALK PHOS: 61 U/L (ref 39–117)
ALT: 22 U/L (ref 0–53)
AST: 28 U/L (ref 0–37)
Albumin: 3.7 g/dL (ref 3.5–5.2)
BILIRUBIN DIRECT: 0.1 mg/dL (ref 0.0–0.3)
BILIRUBIN TOTAL: 0.6 mg/dL (ref 0.2–1.2)
TOTAL PROTEIN: 7 g/dL (ref 6.0–8.3)

## 2014-03-26 LAB — HEMOGLOBIN A1C: Hgb A1c MFr Bld: 7 % — ABNORMAL HIGH (ref 4.6–6.5)

## 2014-03-26 MED ORDER — SILDENAFIL CITRATE 100 MG PO TABS: 50.0000 mg | ORAL_TABLET | Freq: Every day | ORAL | Status: DC | PRN

## 2014-03-26 NOTE — Progress Notes (Signed)
Pre visit review using our clinic review tool, if applicable. No additional management support is needed unless otherwise documented below in the visit note. 

## 2014-03-26 NOTE — Progress Notes (Signed)
   Subjective:    Patient ID: Steven Ward, male    DOB: 07/03/1925, 80 y.o.   MRN: [id number]  HPI Medical followup.  Ongoing lumbar back pain. He has had previous physical therapy and it had some recent epidurals which did not seem to help. Has fairly severe pain at times with some bilateral lower extremity numbness but no definite weakness. No urine or stool incontinence. Symptoms are worse with walking. He tried tramadol without relief. Aspirin helps slightly.  He is requesting referral to neurosurgeon. His pain is limiting activities and sometimes interferes with sleep. No fevers or chills. No appetite or weight changes  Type 2 diabetes. History of good control. Blood sugar stable. No hypoglycemia.  Hypertension controlled with benazepril and amlodipine. Also takes low-dose HCTZ. No recent orthostasis.  Complains of erectile dysfunction. Requesting medication drop. He has not used Viagra or similar drug in the past. No nitroglycerin use.  Past Medical History  Diagnosis Date  . DIABETES MELLITUS, TYPE II 12/01/2008  . ESSENTIAL HYPERTENSION 12/01/2008  . ALLERGIC RHINITIS 12/01/2008  . GERD 12/01/2008  . GALLSTONES 04/07/2009  . SPONDYLOSIS, LUMBAR 12/01/2008  . EDEMA LEG 05/04/2009  . ABNORMAL THYROID FUNCTION TESTS 01/17/2010  . COLONIC POLYPS, HX OF 12/01/2008  . Hay fever   . Colon cancer   . Exocrine pancreatic insufficiency    Past Surgical History  Procedure Laterality Date  . Colon surgery      resection 1990 for cancer    reports that he quit smoking about 45 years ago. His smoking use included Cigarettes. He has a 20 pack-year smoking history. He does not have any smokeless tobacco history on file. He reports that he does not drink alcohol. His drug history is not on file. family history includes Cancer in his other; Diabetes in his other; Stroke in his other. Allergies  Allergen Reactions  . Amoxicillin     REACTION: rash, dizziness      Review of Systems    Constitutional: Negative for fever, chills, appetite change and unexpected weight change.  Respiratory: Negative for cough and shortness of breath.   Cardiovascular: Negative for chest pain and leg swelling.  Gastrointestinal: Negative for abdominal pain.  Endocrine: Negative for polydipsia and polyuria.  Musculoskeletal: Positive for back pain.  Neurological: Positive for numbness. Negative for weakness.       Objective:   Physical Exam  Constitutional: He appears well-developed and well-nourished.  Cardiovascular: Normal rate and regular rhythm.  Exam reveals no gallop.   Pulmonary/Chest: Effort normal and breath sounds normal. No respiratory distress. He has no wheezes. He has no rales.  Musculoskeletal: He exhibits no edema.  Straight leg raises are negative  Neurological: He has normal reflexes.  No focal strength deficits  Psychiatric: He has a normal mood and affect. His behavior is normal.          Assessment & Plan:  #1 type 2 diabetes. History of good control. Recheck A1c. #2 hypertension which is well-controlled #3 erectile dysfunction. Trial Viagra 100 mg one half to one tablet daily as needed #4 chronic lumbar back pain with occasional bilateral radiculopathy symptoms and multilevel spondylosis. Patient requesting referral to neurosurgeon. He had MRI scan March of this year. We'll set up referral

## 2014-04-06 ENCOUNTER — Telehealth: Payer: Self-pay | Admitting: Family Medicine

## 2014-04-06 MED ORDER — TRIAMCINOLONE ACETONIDE 0.1 % EX CREA: TOPICAL_CREAM | CUTANEOUS | Status: DC

## 2014-04-06 MED ORDER — HYDROCHLOROTHIAZIDE 25 MG PO TABS: ORAL_TABLET | ORAL | Status: DC

## 2014-04-06 MED ORDER — TAMSULOSIN HCL 0.4 MG PO CAPS: ORAL_CAPSULE | ORAL | Status: DC

## 2014-04-06 NOTE — Telephone Encounter (Signed)
Rx sent to pharmacy   

## 2014-04-06 NOTE — Telephone Encounter (Signed)
[facility] - [place], [place] - [address] is requesting re-fills on the following: hydrochlorothiazide (HYDRODIURIL) 25 MG tablet tamsulosin (FLOMAX) 0.4 MG CAPS capsule triamcinolone cream (KENALOG) 0.1 %

## 2014-04-13 ENCOUNTER — Encounter: Payer: Self-pay | Admitting: Family Medicine

## 2014-04-13 ENCOUNTER — Ambulatory Visit (INDEPENDENT_AMBULATORY_CARE_PROVIDER_SITE_OTHER): Payer: Commercial Managed Care - HMO | Admitting: Family Medicine

## 2014-04-13 ENCOUNTER — Encounter: Payer: Self-pay | Admitting: Gastroenterology

## 2014-04-13 VITALS — BP 110/60 | HR 82 | Temp 98.6°F | Wt 206.0 lb

## 2014-04-13 DIAGNOSIS — R42 Dizziness and giddiness: Secondary | ICD-10-CM

## 2014-04-13 NOTE — Progress Notes (Signed)
   Subjective:    Patient ID: Steven Ward, male    DOB: 07/30/1925, 80 y.o.   MRN: [id number]  Dizziness Pertinent negatives include no chest pain, coughing, fatigue, headaches or weakness.   Acute visit for dizziness. Patient states for 2 weeks now has had difficulty focusing. He describes some vertigo symptoms which are worse with movement. They're relatively constant but do wax and wane. No headaches. No focal weakness. Some loss of appetite. Denies any speech changes. No acute hearing changes. No swallowing difficulties. No ataxia. No confusion.  Type 2 diabetes and blood sugars have been stable  Past Medical History  Diagnosis Date  . DIABETES MELLITUS, TYPE II 12/01/2008  . ESSENTIAL HYPERTENSION 12/01/2008  . ALLERGIC RHINITIS 12/01/2008  . GERD 12/01/2008  . GALLSTONES 04/07/2009  . SPONDYLOSIS, LUMBAR 12/01/2008  . EDEMA LEG 05/04/2009  . ABNORMAL THYROID FUNCTION TESTS 01/17/2010  . COLONIC POLYPS, HX OF 12/01/2008  . Hay fever   . Colon cancer   . Exocrine pancreatic insufficiency    Past Surgical History  Procedure Laterality Date  . Colon surgery      resection 1990 for cancer    reports that he quit smoking about 45 years ago. His smoking use included Cigarettes. He has a 20 pack-year smoking history. He does not have any smokeless tobacco history on file. He reports that he does not drink alcohol. His drug history is not on file. family history includes Cancer in his other; Diabetes in his other; Stroke in his other. Allergies  Allergen Reactions  . Amoxicillin     REACTION: rash, dizziness      Review of Systems  Constitutional: Negative for fatigue.  Eyes: Negative for visual disturbance.  Respiratory: Negative for cough, chest tightness and shortness of breath.   Cardiovascular: Negative for chest pain, palpitations and leg swelling.  Neurological: Positive for dizziness. Negative for syncope, weakness, light-headedness and headaches.       Objective:   Physical Exam  Constitutional: He is oriented to person, place, and time. He appears well-developed and well-nourished. No distress.  HENT:  Patient has minimal cerumen in both canals otherwise normal exam.  Cardiovascular: Normal rate.   Pulmonary/Chest: Effort normal and breath sounds normal. No respiratory distress. He has no wheezes. He has no rales.  Musculoskeletal: He exhibits no edema.  Neurological: He is alert and oriented to person, place, and time. No cranial nerve deficit.  Cerebellar function is normal. No focal strength deficits Gait normal          Assessment & Plan:  Dizziness. By description, this sounds more like vertigo. Neuro exam is nonfocal and no worrisome red flags. We explained that medications are frequently not helpful for vertigo symptoms. Set up vestibular rehabilitation assessment given chronicity of symptoms over 2 weeks duration

## 2014-04-13 NOTE — Progress Notes (Signed)
Pre visit review using our clinic review tool, if applicable. No additional management support is needed unless otherwise documented below in the visit note. 

## 2014-04-13 NOTE — Patient Instructions (Signed)

## 2014-05-23 ENCOUNTER — Encounter: Payer: Self-pay | Admitting: Gastroenterology

## 2014-07-27 ENCOUNTER — Encounter: Payer: Self-pay | Admitting: Family Medicine

## 2014-07-27 ENCOUNTER — Ambulatory Visit (INDEPENDENT_AMBULATORY_CARE_PROVIDER_SITE_OTHER): Payer: Commercial Managed Care - HMO | Admitting: Family Medicine

## 2014-07-27 VITALS — BP 120/70 | HR 82 | Temp 97.9°F | Wt 214.0 lb

## 2014-07-27 DIAGNOSIS — E119 Type 2 diabetes mellitus without complications: Secondary | ICD-10-CM

## 2014-07-27 DIAGNOSIS — I10 Essential (primary) hypertension: Secondary | ICD-10-CM

## 2014-07-27 DIAGNOSIS — Z23 Encounter for immunization: Secondary | ICD-10-CM

## 2014-07-27 LAB — BASIC METABOLIC PANEL
BUN: 25 mg/dL — AB (ref 6–23)
CO2: 21 mEq/L (ref 19–32)
CREATININE: 1.4 mg/dL (ref 0.4–1.5)
Calcium: 9.1 mg/dL (ref 8.4–10.5)
Chloride: 107 mEq/L (ref 96–112)
GFR: 52.18 mL/min — ABNORMAL LOW (ref >60.00)
GLUCOSE: 232 mg/dL — AB (ref 70–99)
POTASSIUM: 4.2 meq/L (ref 3.5–5.1)
Sodium: 141 mEq/L (ref 135–145)

## 2014-07-27 LAB — HEMOGLOBIN A1C: HEMOGLOBIN A1C: 7 % — AB (ref 4.6–6.5)

## 2014-07-27 MED ORDER — METFORMIN HCL 500 MG PO TABS: 500.0000 mg | ORAL_TABLET | Freq: Two times a day (BID) | ORAL | Status: DC

## 2014-07-27 MED ORDER — TAMSULOSIN HCL 0.4 MG PO CAPS: ORAL_CAPSULE | ORAL | Status: DC

## 2014-07-27 NOTE — Progress Notes (Addendum)
   Subjective:    Patient ID: Steven Ward, male    DOB: 1925-04-06, 80 y.o.   MRN: [id number]  HPI 80 year old male with diabetes presents for 4 month follow up.  States he has no issues.  Checks sugars each morning and is usually around 100.  Denies episodes of hypoglycemia.  No recent illness, headaches, vision changes, peripheral neuropathy, SOB or chest pains.  Requesting to switch back to metformin.  Originally the diarrhea was attributed to this medication.  When taken off, the diarrhea did not resolve until pancreatic enzymes were normalized.  He is currently on glyburide and sitagliptin, which is too expensive.     Review of Systems  Constitutional: Negative for fever, activity change, appetite change, fatigue and unexpected weight change.  HENT: Negative.   Respiratory: Negative for cough, chest tightness and shortness of breath.   Cardiovascular: Negative for chest pain and leg swelling.  Gastrointestinal: Negative for nausea, vomiting, abdominal pain, diarrhea and constipation.  Endocrine: Negative.   Musculoskeletal: Positive for back pain (chronic).  Neurological: Negative for dizziness, weakness, numbness and headaches.       Objective:   Physical Exam  Constitutional: He appears well-developed and well-nourished. No distress.  Neck: Neck supple. No JVD present. No tracheal deviation present. No thyromegaly present.  Cardiovascular: Normal rate, regular rhythm and normal heart sounds.   No murmur heard. Pulmonary/Chest: Effort normal and breath sounds normal. No respiratory distress. He has no wheezes.  Skin: He is not diaphoretic.  Nursing note and vitals reviewed.      Assessment & Plan:  1.  Diabetes mellitus type 2- well controlled.  A1C has been stable at 7.0.  Stop sitagliptin and restart metformin 500 mg BID.  Continue taking glyburide 5 mg as normal.  Check A1C today and kidney function.  Prior creatinine levels have been appropriate to start metformin.   Monitor sugars throughout the day.  Denies hypoglycemic events.  Watch for episodes of diarrhea.  2.  Health Maintenence-    Prevnar 13 given today.  Flu shot was already given this year.   Joseph Art, PA-S   As above.  If diarrhea recurs, consider Glucophage XR.  Repeat BMP.  Pt has Type 2 diabetes which has generally been well controlled.  He has increased cost with Januvia.  He has taken metformin in past and we initially thought this was causing diarrhea but he had pancreatic insufficiency and even after taking him off metformin his diarrhea persisted. He went on Pancrease supplement and diarrhea stopped Stable renal function.  Objective. Alert and in no distress OP- clear. Neck- no mass Chest - clear Cor - RRR EXtremities- no edema.  A/P  Type 2 diabetes controlled.  Stop Januvia and start back metformin 500 mg po BID  Repeat A1C and BMP at follow up.  Carolann Littler MD.

## 2014-07-27 NOTE — Progress Notes (Signed)
Pre visit review using our clinic review tool, if applicable. No additional management support is needed unless otherwise documented below in the visit note. 

## 2014-09-08 ENCOUNTER — Encounter: Payer: Self-pay | Admitting: Cardiovascular Disease

## 2014-09-24 ENCOUNTER — Telehealth: Payer: Self-pay | Admitting: Family Medicine

## 2014-09-24 MED ORDER — SITAGLIPTIN PHOSPHATE 100 MG PO TABS: 100.0000 mg | ORAL_TABLET | Freq: Every day | ORAL | Status: DC

## 2014-09-24 NOTE — Telephone Encounter (Signed)
Medication is not on patient current list. In patient last AVS patient was told be on Metformin. Pt stated that he got diarrhea while taking the medication. Is it okay for the patient to get back on Januvia?

## 2014-09-24 NOTE — Telephone Encounter (Signed)
Rx sent to pharmacy and pt is aware. 

## 2014-09-24 NOTE — Telephone Encounter (Signed)
Start back Januvia 100 mg once daily

## 2014-09-24 NOTE — Telephone Encounter (Signed)
Patient need a re-fill on sitaGLIPtin (JANUVIA) 100 MG tablet , he said he is completely out.   [facility] [place], [place] - [address]

## 2014-09-25 ENCOUNTER — Telehealth: Payer: Self-pay | Admitting: Family Medicine

## 2014-09-25 MED ORDER — SITAGLIPTIN PHOSPHATE 100 MG PO TABS: 100.0000 mg | ORAL_TABLET | Freq: Every day | ORAL | Status: DC

## 2014-09-25 NOTE — Telephone Encounter (Signed)
Patient states he would like 90 day re-fill on sitaGLIPtin (JANUVIA) 100 MG tablet sent to [facility] [phone].  He has switched to them.

## 2014-09-25 NOTE — Telephone Encounter (Signed)
Refill is sent to mail order.

## 2014-10-22 ENCOUNTER — Encounter: Payer: Self-pay | Admitting: Family Medicine

## 2014-10-22 ENCOUNTER — Ambulatory Visit (INDEPENDENT_AMBULATORY_CARE_PROVIDER_SITE_OTHER): Payer: PPO | Admitting: Family Medicine

## 2014-10-22 VITALS — BP 126/70 | HR 71 | Temp 98.3°F | Wt 217.0 lb

## 2014-10-22 DIAGNOSIS — E119 Type 2 diabetes mellitus without complications: Secondary | ICD-10-CM

## 2014-10-22 NOTE — Progress Notes (Signed)
   Subjective:    Patient ID: Steven Ward, male    DOB: 1925/08/14, 81 y.o.   MRN: [id number]  HPI  here to discuss elevated blood sugars. Long-standing history of type 2 diabetes. This has generally been very well controlled. Last A1c 7.0%. He went recently for epidural injection in his back and since then has been having fasting blood sugars low 200 range. Prior to his cortisone injection he was running about 110 fasting glucose. No symptoms of polyuria or polydipsia. He takes glyburide 5 mg once daily and recently increased this to twice daily himself. Also remains on Januvia. He's had diarrhea with extended release and immediate release metformin in the past.  Past Medical History  Diagnosis Date  . DIABETES MELLITUS, TYPE II 12/01/2008  . ESSENTIAL HYPERTENSION 12/01/2008  . ALLERGIC RHINITIS 12/01/2008  . GERD 12/01/2008  . GALLSTONES 04/07/2009  . SPONDYLOSIS, LUMBAR 12/01/2008  . EDEMA LEG 05/04/2009  . ABNORMAL THYROID FUNCTION TESTS 01/17/2010  . COLONIC POLYPS, HX OF 12/01/2008  . Hay fever   . Colon cancer   . Exocrine pancreatic insufficiency    Past Surgical History  Procedure Laterality Date  . Colon surgery      resection 1990 for cancer    reports that he quit smoking about 45 years ago. His smoking use included Cigarettes. He has a 20 pack-year smoking history. He does not have any smokeless tobacco history on file. He reports that he does not drink alcohol. His drug history is not on file. family history includes Cancer in his other; Diabetes in his other; Stroke in his other. Allergies  Allergen Reactions  . Amoxicillin     REACTION: rash, dizziness      Review of Systems  Constitutional: Negative for fatigue.  Eyes: Negative for visual disturbance.  Respiratory: Negative for cough, chest tightness and shortness of breath.   Cardiovascular: Negative for chest pain, palpitations and leg swelling.  Endocrine: Negative for polydipsia and polyuria.  Neurological:  Negative for dizziness, syncope, weakness, light-headedness and headaches.       Objective:   Physical Exam  Constitutional: He is oriented to person, place, and time. He appears well-developed and well-nourished.  HENT:  Mouth/Throat: Oropharynx is clear and moist.  Cardiovascular: Normal rate and regular rhythm.   Pulmonary/Chest: Effort normal and breath sounds normal. No respiratory distress. He has no wheezes. He has no rales.  Neurological: He is alert and oriented to person, place, and time. No cranial nerve deficit.          Assessment & Plan:   Type 2 diabetes. History of good control but recent worsening following cortisone injection. Monitor blood sugars more closely and check at least twice daily. After his fasting blood sugars are consistently less than 180 we would drop back to glyburide once daily. Plan repeat A1c in 3 months

## 2014-10-22 NOTE — Progress Notes (Signed)
Pre visit review using our clinic review tool, if applicable. No additional management support is needed unless otherwise documented below in the visit note. 

## 2014-10-22 NOTE — Patient Instructions (Signed)
Monitor blood sugars closely over the next few weeks May take Glyburide 5 mg one twice daily but drop back to once daily after fasting sugars dropping back < 180.

## 2014-11-12 ENCOUNTER — Telehealth: Payer: Self-pay | Admitting: Family Medicine

## 2014-11-12 NOTE — Telephone Encounter (Signed)
Opened in error

## 2014-11-13 ENCOUNTER — Encounter: Payer: Self-pay | Admitting: Family Medicine

## 2014-11-13 ENCOUNTER — Ambulatory Visit (INDEPENDENT_AMBULATORY_CARE_PROVIDER_SITE_OTHER): Payer: PPO | Admitting: Family Medicine

## 2014-11-13 VITALS — BP 124/66 | HR 98 | Temp 98.2°F | Wt 209.0 lb

## 2014-11-13 DIAGNOSIS — R1030 Lower abdominal pain, unspecified: Secondary | ICD-10-CM | POA: Diagnosis not present

## 2014-11-13 DIAGNOSIS — R1032 Left lower quadrant pain: Secondary | ICD-10-CM

## 2014-11-13 NOTE — Progress Notes (Signed)
   Subjective:    Patient ID: Steven RYANT, male    DOB: 08-Sep-1925, 81 y.o.   MRN: [id number]  HPI Patient seen for acute visit. He was exercising 2 days ago exercise bike and had fairly acute onset of left groin pain. He felt this may have been a muscle cramp. Pain lasted about 5 minutes and resolved. He has not had any recent claudication-type symptoms. His pain did not radiate. He has some chronic low back pains but is not had any progression of that. He has been ambulating without difficulty. He did not notice any bulging or bruises in that region. No severe pain since that time possibly some mild soreness though. He has not noted any lymphadenopathy. No fevers or chills. Does have type 2 diabetes but nonsmoker no history of peripheral vascular disease.  Past Medical History  Diagnosis Date  . DIABETES MELLITUS, TYPE II 12/01/2008  . ESSENTIAL HYPERTENSION 12/01/2008  . ALLERGIC RHINITIS 12/01/2008  . GERD 12/01/2008  . GALLSTONES 04/07/2009  . SPONDYLOSIS, LUMBAR 12/01/2008  . EDEMA LEG 05/04/2009  . ABNORMAL THYROID FUNCTION TESTS 01/17/2010  . COLONIC POLYPS, HX OF 12/01/2008  . Hay fever   . Colon cancer   . Exocrine pancreatic insufficiency    Past Surgical History  Procedure Laterality Date  . Colon surgery      resection 1990 for cancer    reports that he quit smoking about 45 years ago. His smoking use included Cigarettes. He has a 20 pack-year smoking history. He does not have any smokeless tobacco history on file. He reports that he does not drink alcohol. His drug history is not on file. family history includes Cancer in his other; Diabetes in his other; Stroke in his other. Allergies  Allergen Reactions  . Amoxicillin     REACTION: rash, dizziness      Review of Systems  Constitutional: Negative for fatigue.  Eyes: Negative for visual disturbance.  Respiratory: Negative for cough, chest tightness and shortness of breath.   Cardiovascular: Negative for chest pain,  palpitations and leg swelling.  Neurological: Negative for dizziness, syncope, weakness, light-headedness, numbness and headaches.       Objective:   Physical Exam  Constitutional: He appears well-developed and well-nourished.  Cardiovascular: Normal rate and regular rhythm.   Pulmonary/Chest: Effort normal and breath sounds normal. No respiratory distress. He has no wheezes. He has no rales.  Musculoskeletal: He exhibits edema.  Trace edema legs bilaterally. Left foot is slightly cool to touch. He has 1+ dorsalis pedis and posterior tibial pulses. Capillary refill is fair  Slightly limited range of motion left hip. Left inguinal region appears normal. No bruising. No adenopathy. No evidence for inguinal hernia          Assessment & Plan:  Transient left groin pain. Possible muscle cramp. Pain was very acute and transient lasting about 5 minutes. No evidence for hernia. No evidence for claudication. Observe for now. Stay well-hydrated. Consider left hip films if pain recurs or persists

## 2014-11-13 NOTE — Progress Notes (Signed)
Pre visit review using our clinic review tool, if applicable. No additional management support is needed unless otherwise documented below in the visit note. 

## 2014-11-13 NOTE — Patient Instructions (Signed)

## 2014-11-25 ENCOUNTER — Ambulatory Visit: Payer: Commercial Managed Care - HMO | Admitting: Family Medicine

## 2014-11-28 ENCOUNTER — Emergency Department (HOSPITAL_COMMUNITY)
Admission: EM | Admit: 2014-11-28 | Discharge: 2014-11-28 | Disposition: A | Discharge status: Home / Self Care | Payer: PPO | Attending: Emergency Medicine | Admitting: Emergency Medicine

## 2014-11-28 ENCOUNTER — Encounter (HOSPITAL_COMMUNITY): Payer: Self-pay | Admitting: *Deleted

## 2014-11-28 DIAGNOSIS — I10 Essential (primary) hypertension: Secondary | ICD-10-CM | POA: Insufficient documentation

## 2014-11-28 DIAGNOSIS — Z85038 Personal history of other malignant neoplasm of large intestine: Secondary | ICD-10-CM | POA: Diagnosis not present

## 2014-11-28 DIAGNOSIS — K5900 Constipation, unspecified: Secondary | ICD-10-CM | POA: Insufficient documentation

## 2014-11-28 DIAGNOSIS — Z8719 Personal history of other diseases of the digestive system: Secondary | ICD-10-CM | POA: Diagnosis not present

## 2014-11-28 DIAGNOSIS — Z86010 Personal history of colonic polyps: Secondary | ICD-10-CM | POA: Diagnosis not present

## 2014-11-28 DIAGNOSIS — E119 Type 2 diabetes mellitus without complications: Secondary | ICD-10-CM | POA: Insufficient documentation

## 2014-11-28 DIAGNOSIS — Z7952 Long term (current) use of systemic steroids: Secondary | ICD-10-CM | POA: Diagnosis not present

## 2014-11-28 DIAGNOSIS — Z87891 Personal history of nicotine dependence: Secondary | ICD-10-CM | POA: Diagnosis not present

## 2014-11-28 DIAGNOSIS — Z880 Allergy status to penicillin: Secondary | ICD-10-CM | POA: Diagnosis not present

## 2014-11-28 DIAGNOSIS — Z79899 Other long term (current) drug therapy: Secondary | ICD-10-CM | POA: Insufficient documentation

## 2014-11-28 MED ORDER — MILK AND MOLASSES ENEMA
1.0000 | Freq: Once | RECTAL | Status: AC
  Administered 2014-11-28: 250 mL via RECTAL
  Filled 2014-11-28: qty 250

## 2014-11-28 NOTE — ED Notes (Signed)
Pt had large bowel movement.

## 2014-11-28 NOTE — ED Notes (Signed)
Pt c/o constipation sts 4-5 days since LBM, usually has to take laxatives or enemas. Sts took laxative this am.

## 2014-11-28 NOTE — Discharge Instructions (Signed)

## 2014-11-28 NOTE — ED Provider Notes (Signed)
CSN: [id number]     Arrival date & time 11/28/14  0802 History   First MD Initiated Contact with Patient 11/28/14 [phone]     Chief Complaint  Patient presents with  . Constipation     (Consider location/radiation/quality/duration/timing/severity/associated sxs/prior Treatment) Patient is a 81 y.o. male presenting with constipation. The history is provided by the patient.  Constipation Severity:  Moderate Time since last bowel movement:  5 days Timing:  Constant Progression:  Unchanged Chronicity:  Recurrent Context: dehydration (possible)   Context: not medication and not narcotics   Relieved by:  Nothing Worsened by:  Nothing tried Ineffective treatments:  Miralax and enemas Associated symptoms: no abdominal pain, no fever and no vomiting     Past Medical History  Diagnosis Date  . DIABETES MELLITUS, TYPE II 12/01/2008  . ESSENTIAL HYPERTENSION 12/01/2008  . ALLERGIC RHINITIS 12/01/2008  . GERD 12/01/2008  . GALLSTONES 04/07/2009  . SPONDYLOSIS, LUMBAR 12/01/2008  . EDEMA LEG 05/04/2009  . ABNORMAL THYROID FUNCTION TESTS 01/17/2010  . COLONIC POLYPS, HX OF 12/01/2008  . Hay fever   . Colon cancer   . Exocrine pancreatic insufficiency    Past Surgical History  Procedure Laterality Date  . Colon surgery      resection 1990 for cancer   Family History  Problem Relation Age of Onset  . Cancer Other     colon  . Diabetes Other   . Stroke Other    History  Substance Use Topics  . Smoking status: Former Smoker -- 1.00 packs/day for 20 years    Types: Cigarettes    Quit date: 12/28/1968  . Smokeless tobacco: Not on file  . Alcohol Use: No    Review of Systems  Constitutional: Negative for fever.  Respiratory: Negative for cough and shortness of breath.   Gastrointestinal: Positive for constipation. Negative for vomiting and abdominal pain.  All other systems reviewed and are negative.     Allergies  Amoxicillin  Home Medications   Prior to Admission medications    Medication Sig Start Date End Date Taking? Authorizing Provider  amLODipine (NORVASC) 5 MG tablet Take 5 mg by mouth daily.      Historical Provider, MD  benazepril (LOTENSIN) 20 MG tablet Take 20 mg by mouth daily.      Historical Provider, MD  glyBURIDE (DIABETA) 5 MG tablet Take 1 tablet (5 mg total) by mouth daily with breakfast. 01/02/12   [name], MD  hydrochlorothiazide (HYDRODIURIL) 25 MG tablet TAKE ONE-HALF TABLET BY MOUTH EVERY DAY 04/06/14   [name], MD  Pancrelipase, Lip-Prot-Amyl, (CREON) 12000 UNITS CPEP Take by mouth 3 (three) times daily before meals.      Historical Provider, MD  sildenafil (VIAGRA) 100 MG tablet Take 0.5-1 tablets (50-100 mg total) by mouth daily as needed for erectile dysfunction. 03/26/14   [name], MD  sitaGLIPtin (JANUVIA) 100 MG tablet Take 1 tablet (100 mg total) by mouth daily. 09/25/14   [name], MD  tamsulosin (FLOMAX) 0.4 MG CAPS capsule TAKE ONE CAPSULE BY MOUTH ONCE DAILY. 07/27/14   [name], MD  triamcinolone cream (KENALOG) 0.1 % APPLY TO AFFECTED AREA TWICE DAILY. [place] 04/06/14   [name], MD   BP 124/59 mmHg  Pulse 97  Temp(Src) 98.1 F (36.7 C) (Oral)  Resp 17  SpO2 97% Physical Exam  Constitutional: He is oriented to person, place, and time. He appears well-developed and well-nourished. No distress.  HENT:  Head: Normocephalic and atraumatic.  Mouth/Throat: Oropharynx is clear and moist. No oropharyngeal exudate.  Eyes: EOM are normal. Pupils are equal, round, and reactive to light.  Neck: Normal range of motion. Neck supple.  Cardiovascular: Normal rate and regular rhythm.  Exam reveals no friction rub.   No murmur heard. Pulmonary/Chest: Effort normal and breath sounds normal. No respiratory distress. He has no wheezes. He has no rales.  Abdominal: Soft. He exhibits no distension. There is no tenderness. There is no rebound.  Genitourinary: Rectal exam shows no external  hemorrhoid, no internal hemorrhoid and no tenderness.  Soft stool in the rectal vault, no evidence for impaction  Musculoskeletal: Normal range of motion. He exhibits no edema.  Neurological: He is alert and oriented to person, place, and time. No cranial nerve deficit. He exhibits normal muscle tone. Coordination normal.  Skin: No rash noted. He is not diaphoretic.  Nursing note and vitals reviewed.   ED Course  Procedures (including critical care time) Labs Review Labs Reviewed - No data to display  Imaging Review No results found.   EKG Interpretation None      MDM   Final diagnoses:  Constipation, unspecified constipation type    81 year old male here with constipation. Hasn't had a good bowel movement the past 4-5 days. History of constipation prior that is relieved with laxatives and enemas at home. He is not currently on any pain medicine. He denies any abdominal pain, nausea, vomiting. No blood in the stool. History of colon cancer over 20 years ago. Here vitals are stable. Belly is not distended. There is no abdominal pain. He has a rectal vault full soft stool. No evidence for impaction. We'll plan on an enema here. Belly exam is benign, no concern for obstruction. Much relief after enema. Patient doing well, easily ambulatory around the department. Stable for discharge.    Evelina Bucy, MD 11/29/14 [phone]

## 2015-01-20 ENCOUNTER — Encounter: Payer: Self-pay | Admitting: Family Medicine

## 2015-01-20 ENCOUNTER — Ambulatory Visit (INDEPENDENT_AMBULATORY_CARE_PROVIDER_SITE_OTHER): Payer: PPO | Admitting: Family Medicine

## 2015-01-20 VITALS — BP 128/68 | HR 81 | Temp 97.7°F | Wt 209.0 lb

## 2015-01-20 DIAGNOSIS — I10 Essential (primary) hypertension: Secondary | ICD-10-CM | POA: Diagnosis not present

## 2015-01-20 DIAGNOSIS — E119 Type 2 diabetes mellitus without complications: Secondary | ICD-10-CM

## 2015-01-20 DIAGNOSIS — M47817 Spondylosis without myelopathy or radiculopathy, lumbosacral region: Secondary | ICD-10-CM | POA: Diagnosis not present

## 2015-01-20 LAB — HEPATIC FUNCTION PANEL
ALBUMIN: 3.6 g/dL (ref 3.5–5.2)
ALT: 9 U/L (ref 0–53)
AST: 13 U/L (ref 0–37)
Alkaline Phosphatase: 76 U/L (ref 39–117)
Bilirubin, Direct: 0.2 mg/dL (ref 0.0–0.3)
Total Bilirubin: 0.5 mg/dL (ref 0.2–1.2)
Total Protein: 7 g/dL (ref 6.0–8.3)

## 2015-01-20 LAB — BASIC METABOLIC PANEL
BUN: 29 mg/dL — AB (ref 6–23)
CHLORIDE: 105 meq/L (ref 96–112)
CO2: 27 mEq/L (ref 19–32)
Calcium: 9.2 mg/dL (ref 8.4–10.5)
Creatinine, Ser: 1.42 mg/dL (ref 0.40–1.50)
GFR: 50.01 mL/min — AB (ref >60.00)
Glucose, Bld: 166 mg/dL — ABNORMAL HIGH (ref 70–99)
POTASSIUM: 4.8 meq/L (ref 3.5–5.1)
Sodium: 139 mEq/L (ref 135–145)

## 2015-01-20 LAB — LIPID PANEL
Cholesterol: 114 mg/dL (ref 0–200)
HDL: 34.7 mg/dL — ABNORMAL LOW (ref >39.00)
LDL Cholesterol: 67 mg/dL (ref 0–99)
NONHDL: 79.3
Total CHOL/HDL Ratio: 3
Triglycerides: 60 mg/dL (ref 0.0–149.0)
VLDL: 12 mg/dL (ref 0.0–40.0)

## 2015-01-20 LAB — HEMOGLOBIN A1C: Hgb A1c MFr Bld: 7.3 % — ABNORMAL HIGH (ref 4.6–6.5)

## 2015-01-20 MED ORDER — HYDROCODONE-ACETAMINOPHEN 5-325 MG PO TABS: 1.0000 | ORAL_TABLET | Freq: Four times a day (QID) | ORAL | Status: DC | PRN

## 2015-01-20 NOTE — Progress Notes (Signed)
Pre visit review using our clinic review tool, if applicable. No additional management support is needed unless otherwise documented below in the visit note. 

## 2015-01-20 NOTE — Progress Notes (Signed)
   Subjective:    Patient ID: Steven Ward, male    DOB: 1924/11/02, 81 y.o.   MRN: [id number]  HPI Patient seen for the following  Type 2 diabetes. History of good control. Blood sugar stable. No polyuria or polydipsia. Last A1c 7.0%.  Lumbar stenosis. He has seen orthopedist. He is not interested in surgery. Pain has been progressive. Worse with ambulation. Radiation down both lower extremities. Not controlled with Tylenol. He had epidural injections without improvement. Requesting consideration for pain medication. Scheduled to see chiropractor later this week  Hypertension which is been well controlled. No dizziness. No chest pains. Patient will muscle cramps in legs.  Past Medical History  Diagnosis Date  . DIABETES MELLITUS, TYPE II 12/01/2008  . ESSENTIAL HYPERTENSION 12/01/2008  . ALLERGIC RHINITIS 12/01/2008  . GERD 12/01/2008  . GALLSTONES 04/07/2009  . SPONDYLOSIS, LUMBAR 12/01/2008  . EDEMA LEG 05/04/2009  . ABNORMAL THYROID FUNCTION TESTS 01/17/2010  . COLONIC POLYPS, HX OF 12/01/2008  . Hay fever   . Colon cancer   . Exocrine pancreatic insufficiency    Past Surgical History  Procedure Laterality Date  . Colon surgery      resection 1990 for cancer    reports that he quit smoking about 46 years ago. His smoking use included Cigarettes. He has a 20 pack-year smoking history. He does not have any smokeless tobacco history on file. He reports that he does not drink alcohol. His drug history is not on file. family history includes Cancer in his other; Diabetes in his other; Stroke in his other. Allergies  Allergen Reactions  . Amoxicillin     REACTION: rash, dizziness      Review of Systems  Constitutional: Positive for fatigue. Negative for appetite change and unexpected weight change.  Eyes: Negative for visual disturbance.  Respiratory: Negative for cough, chest tightness and shortness of breath.   Cardiovascular: Negative for chest pain, palpitations and leg  swelling.  Endocrine: Negative for polydipsia and polyuria.  Musculoskeletal: Positive for back pain.  Neurological: Negative for dizziness, syncope, weakness, light-headedness and headaches.       Objective:   Physical Exam  Constitutional: He appears well-developed and well-nourished.  Neck: Neck supple. No JVD present.  Cardiovascular: Normal rate and regular rhythm.   Pulmonary/Chest: Effort normal and breath sounds normal. No respiratory distress. He has no wheezes. He has no rales.  Musculoskeletal: He exhibits no edema.  Neurological: He is alert.          Assessment & Plan:  #1 type 2 diabetes. History of good control. Recheck A1c. He gets regular eye exams #2 spinal stenosis. Pain poorly controlled. We discussed risk and limitations of opioids. He has not gotten relief previous with tramadol or over-the-counter analgesics. Pain is 7-8 out of 10 at times. Limited cautious trial of hydrocodone 5 mg 1 every 6 hours for severe pain. We've recommended against regular use of possible. #3 hypertension well controlled and at goal. Continue current medications. Check basic metabolic panel

## 2015-03-03 ENCOUNTER — Telehealth: Payer: Self-pay | Admitting: Family Medicine

## 2015-03-03 MED ORDER — ACCU-CHEK MULTICLIX LANCETS MISC: Status: DC

## 2015-03-03 MED ORDER — GLUCOSE BLOOD VI STRP: ORAL_STRIP | Status: DC

## 2015-03-03 NOTE — Telephone Encounter (Signed)
Rx sent to mail order

## 2015-03-03 NOTE — Telephone Encounter (Signed)
Pt needs new rxs test strips#270 and lancets #90 w/refills fax to [facility] [phone]

## 2015-03-05 ENCOUNTER — Ambulatory Visit: Payer: PPO | Admitting: Podiatry

## 2015-03-09 ENCOUNTER — Other Ambulatory Visit: Payer: Self-pay

## 2015-03-09 MED ORDER — ONETOUCH ULTRA 2 W/DEVICE KIT: PACK | Status: DC

## 2015-03-09 MED ORDER — ONETOUCH ULTRASOFT LANCETS MISC: Status: DC

## 2015-03-09 MED ORDER — GLUCOSE BLOOD VI STRP: ORAL_STRIP | Status: DC

## 2015-03-10 ENCOUNTER — Ambulatory Visit: Payer: PPO | Admitting: Podiatry

## 2015-03-10 ENCOUNTER — Ambulatory Visit (INDEPENDENT_AMBULATORY_CARE_PROVIDER_SITE_OTHER): Payer: PPO | Admitting: Podiatry

## 2015-03-10 DIAGNOSIS — B351 Tinea unguium: Secondary | ICD-10-CM | POA: Diagnosis not present

## 2015-03-10 DIAGNOSIS — M79673 Pain in unspecified foot: Secondary | ICD-10-CM

## 2015-03-10 NOTE — Progress Notes (Signed)
Patient ID: Steven Ward, male   DOB: 1925/04/16, 81 y.o.   MRN: [id number] Complaint:  Visit Type: Patient returns to my office for continued preventative foot care services. Complaint: Patient states" my nails have grown long and thick and become painful to walk and wear shoes" Patient has been diagnosed with DM with no  foot complications. He presents for preventative foot care services. No changes to ROS  Podiatric Exam: Vascular: dorsalis pedis and posterior tibial pulses are palpable bilateral. Capillary return is immediate. Temperature gradient is WNL. Skin turgor WNL  Sensorium: Normal Semmes Weinstein monofilament test. Normal tactile sensation bilaterally. Nail Exam: Pt has thick disfigured discolored nails with subungual debris noted bilateral entire nail hallux through fifth toenails Ulcer Exam: There is no evidence of ulcer or pre-ulcerative changes or infection. Orthopedic Exam: Muscle tone and strength are WNL. No limitations in general ROM. No crepitus or effusions noted. Foot type and digits show no abnormalities. Bony prominences are unremarkable. Skin: No Porokeratosis. No infection or ulcers  Diagnosis:  Tinea unguium, Pain in right toe, pain in left toes  Treatment & Plan Procedures and Treatment: Consent by patient was obtained for treatment procedures. The patient understood the discussion of treatment and procedures well. All questions were answered thoroughly reviewed. Debridement of mycotic and hypertrophic toenails, 1 through 5 bilateral and clearing of subungual debris. No ulceration, no infection noted.  Return Visit-Office Procedure: Patient instructed to return to the office for a follow up visit 10 weeks  for continued evaluation and treatment.

## 2015-05-19 ENCOUNTER — Ambulatory Visit (INDEPENDENT_AMBULATORY_CARE_PROVIDER_SITE_OTHER): Payer: PPO | Admitting: Podiatry

## 2015-05-19 ENCOUNTER — Encounter: Payer: Self-pay | Admitting: Podiatry

## 2015-05-19 VITALS — BP 118/64 | HR 60 | Resp 16

## 2015-05-19 DIAGNOSIS — M79673 Pain in unspecified foot: Secondary | ICD-10-CM | POA: Diagnosis not present

## 2015-05-19 DIAGNOSIS — B351 Tinea unguium: Secondary | ICD-10-CM

## 2015-05-19 NOTE — Progress Notes (Signed)
Patient ID: Steven Ward, male   DOB: 01/30/1925, 81 y.o.   MRN: [id number] Complaint:  Visit Type: Patient returns to my office for continued preventative foot care services. Complaint: Patient states" my nails have grown long and thick and become painful to walk and wear shoes" Patient has been diagnosed with DM with no  foot complications. He presents for preventative foot care services. No changes to ROS  Podiatric Exam: Vascular: dorsalis pedis and posterior tibial pulses are palpable bilateral. Capillary return is immediate. Temperature gradient is WNL. Skin turgor WNL  Sensorium: Normal Semmes Weinstein monofilament test. Normal tactile sensation bilaterally. Nail Exam: Pt has thick disfigured discolored nails with subungual debris noted bilateral entire nail hallux through fifth toenails Ulcer Exam: There is no evidence of ulcer or pre-ulcerative changes or infection. Orthopedic Exam: Muscle tone and strength are WNL. No limitations in general ROM. No crepitus or effusions noted. Foot type and digits show no abnormalities. Bony prominences are unremarkable. Skin: No Porokeratosis. No infection or ulcers  Diagnosis:  Tinea unguium, Pain in right toe, pain in left toes  Treatment & Plan Procedures and Treatment: Consent by patient was obtained for treatment procedures. The patient understood the discussion of treatment and procedures well. All questions were answered thoroughly reviewed. Debridement of mycotic and hypertrophic toenails, 1 through 5 bilateral and clearing of subungual debris. No ulceration, no infection noted.  Return Visit-Office Procedure: Patient instructed to return to the office for a follow up visit 10 weeks  for continued evaluation and treatment. 

## 2015-05-25 ENCOUNTER — Ambulatory Visit (INDEPENDENT_AMBULATORY_CARE_PROVIDER_SITE_OTHER): Payer: PPO | Admitting: Family Medicine

## 2015-05-25 ENCOUNTER — Encounter: Payer: Self-pay | Admitting: Family Medicine

## 2015-05-25 VITALS — BP 128/70 | HR 67 | Temp 98.1°F | Wt 212.0 lb

## 2015-05-25 DIAGNOSIS — E119 Type 2 diabetes mellitus without complications: Secondary | ICD-10-CM

## 2015-05-25 DIAGNOSIS — M4806 Spinal stenosis, lumbar region: Secondary | ICD-10-CM | POA: Diagnosis not present

## 2015-05-25 DIAGNOSIS — I10 Essential (primary) hypertension: Secondary | ICD-10-CM

## 2015-05-25 DIAGNOSIS — M48061 Spinal stenosis, lumbar region without neurogenic claudication: Secondary | ICD-10-CM

## 2015-05-25 LAB — HEMOGLOBIN A1C: HEMOGLOBIN A1C: 7.2 % — AB (ref 4.6–6.5)

## 2015-05-25 MED ORDER — TRAMADOL HCL 50 MG PO TABS: 50.0000 mg | ORAL_TABLET | Freq: Four times a day (QID) | ORAL | Status: DC | PRN

## 2015-05-25 NOTE — Progress Notes (Signed)
   Subjective:    Patient ID: Steven Ward, male    DOB: [birth year], 81 y.o.   MRN: [id number]  HPI Medical follow-up  Type 2 diabetes. History of fair control. Last A1c 7.3%. Poor compliance with diet. Very limited activity secondary to his lumbar stenosis. He thinks this is impacting his blood sugar negatively. Fasting blood sugar stable. No hypoglycemia.  Hypertension. Medications reviewed. Compliant with all. Blood pressure stable. No dizziness. No chest pains. Bilateral leg edema which is somewhat chronic. Improved with elevation and at night. No orthopnea  Lumbar stenosis. Progressive low back pain. He is declined surgery. Has seen multiple specialists in the past. Currently using aspirin with poor control.  Past Medical History  Diagnosis Date  . DIABETES MELLITUS, TYPE II 12/01/2008  . ESSENTIAL HYPERTENSION 12/01/2008  . ALLERGIC RHINITIS 12/01/2008  . GERD 12/01/2008  . GALLSTONES 04/07/2009  . SPONDYLOSIS, LUMBAR 12/01/2008  . EDEMA LEG 05/04/2009  . ABNORMAL THYROID FUNCTION TESTS 01/17/2010  . COLONIC POLYPS, HX OF 12/01/2008  . Hay fever   . Colon cancer   . Exocrine pancreatic insufficiency    Past Surgical History  Procedure Laterality Date  . Colon surgery      resection 1990 for cancer    reports that he quit smoking about 46 years ago. His smoking use included Cigarettes. He has a 20 pack-year smoking history. He does not have any smokeless tobacco history on file. He reports that he does not drink alcohol. His drug history is not on file. family history includes Cancer in his other; Diabetes in his other; Stroke in his other. Allergies  Allergen Reactions  . Amoxicillin     REACTION: rash, dizziness      Review of Systems  Constitutional: Negative for fatigue and unexpected weight change.  Eyes: Negative for visual disturbance.  Respiratory: Negative for cough, chest tightness and shortness of breath.   Cardiovascular: Positive for leg swelling. Negative  for chest pain and palpitations.  Endocrine: Negative for polydipsia and polyuria.  Genitourinary: Negative for dysuria.  Musculoskeletal: Positive for back pain.  Neurological: Negative for dizziness, syncope, weakness, light-headedness and headaches.       Objective:   Physical Exam  Constitutional: He is oriented to person, place, and time. He appears well-developed and well-nourished.  HENT:  Right Ear: External ear normal.  Left Ear: External ear normal.  Mouth/Throat: Oropharynx is clear and moist.  Eyes: Pupils are equal, round, and reactive to light.  Neck: Neck supple. No thyromegaly present.  Cardiovascular: Normal rate and regular rhythm.   Pulmonary/Chest: Effort normal and breath sounds normal. No respiratory distress. He has no wheezes. He has no rales.  Musculoskeletal: He exhibits edema.  Patient has 1+ pitting edema lower legs bilaterally  Neurological: He is alert and oriented to person, place, and time.  No focal strength deficits          Assessment & Plan:  #1 type 2 diabetes. History of fair control. Recheck A1c #2 hypertension stable and at goal. Continue current medications #3 bilateral leg edema. Suspect largely venous stasis. He has declined compression garments. Elevate legs frequently. May need to consider furosemide intermittently if progresses. We've recommended daily weight monitoring #4 chronic back pain with lumbar stenosis. Tramadol 50 mg 1-2 every 6 hours as needed. #5 health maintenance. Recommend flu vaccine. Patient declines today

## 2015-05-25 NOTE — Progress Notes (Signed)
Pre visit review using our clinic review tool, if applicable. No additional management support is needed unless otherwise documented below in the visit note. 

## 2015-07-02 ENCOUNTER — Telehealth: Payer: Self-pay | Admitting: Family Medicine

## 2015-07-02 NOTE — Telephone Encounter (Signed)
I will have my nurse look into this order.  I also don't know if the patient is still wanting this. CD

## 2015-07-02 NOTE — Telephone Encounter (Signed)
[facility] states they sent order for back brace back in july.  Pt has requested  This back brace. They would like to know if dr will order for patient?

## 2015-07-05 ENCOUNTER — Telehealth: Payer: Self-pay | Admitting: Family Medicine

## 2015-07-05 NOTE — Telephone Encounter (Signed)
Error . Will send to correct PCP

## 2015-07-05 NOTE — Telephone Encounter (Signed)
[facility] states they sent order for back brace back in july. Pt has requested This back brace. They would like to know if dr will order for patient

## 2015-07-05 NOTE — Telephone Encounter (Signed)
I have not discussed any back braces with patient.  We did not order this.

## 2015-07-05 NOTE — Telephone Encounter (Signed)
This is not a patient of Dr. Edwena Felty. Please send to the correct physician.

## 2015-07-12 LAB — HM DIABETES EYE EXAM

## 2015-08-04 ENCOUNTER — Encounter: Payer: Self-pay | Admitting: Podiatry

## 2015-08-04 ENCOUNTER — Ambulatory Visit (INDEPENDENT_AMBULATORY_CARE_PROVIDER_SITE_OTHER): Payer: PPO | Admitting: Podiatry

## 2015-08-04 DIAGNOSIS — B351 Tinea unguium: Secondary | ICD-10-CM

## 2015-08-04 NOTE — Addendum Note (Signed)
Addended by: Ezzard Flax, Hazael Olveda L on: 08/04/2015 10:18 AM   Modules accepted: Orders, Medications

## 2015-08-04 NOTE — Progress Notes (Signed)
Patient ID: Steven Ward, male   DOB: 01-16-1925, 81 y.o.   MRN: [id number] Complaint:  Visit Type: Patient returns to my office for continued preventative foot care services. Complaint: Patient states" my nails have grown long and thick and become painful to walk and wear shoes" Patient has been diagnosed with DM with no  foot complications. He presents for preventative foot care services. No changes to ROS  Podiatric Exam: Vascular: dorsalis pedis and posterior tibial pulses are palpable bilateral. Capillary return is immediate. Temperature gradient is WNL. Skin turgor WNL  Sensorium: Normal Semmes Weinstein monofilament test. Normal tactile sensation bilaterally. Nail Exam: Pt has thick disfigured discolored nails with subungual debris noted bilateral entire nail hallux through fifth toenails Ulcer Exam: There is no evidence of ulcer or pre-ulcerative changes or infection. Orthopedic Exam: Muscle tone and strength are WNL. No limitations in general ROM. No crepitus or effusions noted. Foot type and digits show no abnormalities. Bony prominences are unremarkable. Skin: No Porokeratosis. No infection or ulcers  Diagnosis:  Tinea unguium, Pain in right toe, pain in left toes  Treatment & Plan Procedures and Treatment: Consent by patient was obtained for treatment procedures. The patient understood the discussion of treatment and procedures well. All questions were answered thoroughly reviewed. Debridement of mycotic and hypertrophic toenails, 1 through 5 bilateral and clearing of subungual debris. No ulceration, no infection noted.  Return Visit-Office Procedure: Patient instructed to return to the office for a follow up visit 10 weeks  for continued evaluation and treatment.  Gardiner Barefoot DPM

## 2015-08-25 ENCOUNTER — Encounter: Payer: Self-pay | Admitting: Family Medicine

## 2015-08-25 ENCOUNTER — Ambulatory Visit (INDEPENDENT_AMBULATORY_CARE_PROVIDER_SITE_OTHER): Payer: PPO | Admitting: Family Medicine

## 2015-08-25 VITALS — BP 100/60 | HR 71 | Temp 98.2°F | Resp 16 | Ht 68.0 in | Wt 209.1 lb

## 2015-08-25 DIAGNOSIS — I10 Essential (primary) hypertension: Secondary | ICD-10-CM | POA: Diagnosis not present

## 2015-08-25 DIAGNOSIS — N183 Chronic kidney disease, stage 3 (moderate): Secondary | ICD-10-CM | POA: Diagnosis not present

## 2015-08-25 DIAGNOSIS — M48061 Spinal stenosis, lumbar region without neurogenic claudication: Secondary | ICD-10-CM

## 2015-08-25 DIAGNOSIS — E1122 Type 2 diabetes mellitus with diabetic chronic kidney disease: Secondary | ICD-10-CM | POA: Diagnosis not present

## 2015-08-25 DIAGNOSIS — M4806 Spinal stenosis, lumbar region: Secondary | ICD-10-CM

## 2015-08-25 LAB — HM DIABETES FOOT EXAM: HM Diabetic Foot Exam: NORMAL

## 2015-08-25 LAB — HEMOGLOBIN A1C: Hgb A1c MFr Bld: 7.4 % — ABNORMAL HIGH (ref 4.6–6.5)

## 2015-08-25 NOTE — Progress Notes (Signed)
   Subjective:    Patient ID: Steven Ward, male    DOB: Sep 01, 1925, 81 y.o.   MRN: [id number]  HPI Medical follow-up.  Long history of lumbar stenosis. He's had previous injections without much relief. This is starting to reduce his activity somewhat. He refuses surgery. No recent progressive neurologic symptoms  Hypertension. Treated with amlodipine, benazepril, and HCTZ. No orthostasis. Compliant with therapy. Trace ankle edema unchanged  Type 2 diabetes. History of stable control for several years. Last A1c 7.2%. Does not monitor sugars regularly. Currently on metformin and Januvia. Last creatinine around 1.4. Previously took Actos but we discontinued because of peripheral edema. He does not have any history of systolic heart failure.  Past Medical History  Diagnosis Date  . DIABETES MELLITUS, TYPE II 12/01/2008  . ESSENTIAL HYPERTENSION 12/01/2008  . ALLERGIC RHINITIS 12/01/2008  . GERD 12/01/2008  . GALLSTONES 04/07/2009  . SPONDYLOSIS, LUMBAR 12/01/2008  . EDEMA LEG 05/04/2009  . ABNORMAL THYROID FUNCTION TESTS 01/17/2010  . COLONIC POLYPS, HX OF 12/01/2008  . Hay fever   . Colon cancer ([place])   . Exocrine pancreatic insufficiency [facility])    Past Surgical History  Procedure Laterality Date  . Colon surgery      resection 1990 for cancer    reports that he quit smoking about 46 years ago. His smoking use included Cigarettes. He has a 20 pack-year smoking history. He does not have any smokeless tobacco history on file. He reports that he does not drink alcohol. His drug history is not on file. family history includes Cancer in his other; Diabetes in his other; Stroke in his other. Allergies  Allergen Reactions  . Amoxicillin     REACTION: rash, dizziness      Review of Systems  Constitutional: Negative for fatigue and unexpected weight change.  Eyes: Negative for visual disturbance.  Respiratory: Negative for cough, chest tightness and shortness of breath.   Cardiovascular:  Positive for leg swelling. Negative for chest pain and palpitations.  Endocrine: Negative for polydipsia and polyuria.  Genitourinary: Negative for dysuria.  Neurological: Negative for dizziness, syncope, weakness, light-headedness and headaches.       Objective:   Physical Exam  Constitutional: He is oriented to person, place, and time. He appears well-developed and well-nourished.  Neck: Neck supple. No thyromegaly present.  Cardiovascular: Normal rate and regular rhythm.   Pulmonary/Chest: Effort normal and breath sounds normal. No respiratory distress. He has no wheezes. He has no rales.  Musculoskeletal:  Trace edema legs bilaterally  Neurological: He is alert and oriented to person, place, and time.  Psychiatric: He has a normal mood and affect. His behavior is normal.          Assessment & Plan:  #1 type 2 diabetes. History of stable control. Recheck A1c. Goal less than 8 given his age and multiple co-morbidities #2 hypertension stable and at goal. #3 lumbar stenosis. We have previously treated with tramadol in the past. Currently pain stable but impairing activity somewhat. Walking as tolerated.

## 2015-08-25 NOTE — Progress Notes (Signed)
Pre visit review using our clinic review tool, if applicable. No additional management support is needed unless otherwise documented below in the visit note. 

## 2015-10-08 ENCOUNTER — Ambulatory Visit (INDEPENDENT_AMBULATORY_CARE_PROVIDER_SITE_OTHER): Payer: PPO | Admitting: Family Medicine

## 2015-10-08 ENCOUNTER — Encounter: Payer: Self-pay | Admitting: Family Medicine

## 2015-10-08 VITALS — BP 120/78 | HR 81 | Temp 98.2°F | Ht 68.0 in | Wt 209.1 lb

## 2015-10-08 DIAGNOSIS — I10 Essential (primary) hypertension: Secondary | ICD-10-CM

## 2015-10-08 DIAGNOSIS — E1121 Type 2 diabetes mellitus with diabetic nephropathy: Secondary | ICD-10-CM

## 2015-10-08 MED ORDER — SITAGLIPTIN PHOSPHATE 100 MG PO TABS: 100.0000 mg | ORAL_TABLET | Freq: Every day | ORAL | Status: DC

## 2015-10-08 MED ORDER — TRIAMCINOLONE ACETONIDE 0.1 % EX CREA: TOPICAL_CREAM | Freq: Two times a day (BID) | CUTANEOUS | Status: DC | PRN

## 2015-10-08 NOTE — Progress Notes (Signed)
   Subjective:    Patient ID: Steven Ward, male    DOB: 12-07-1924, 82 y.o.   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 He has history of type 2 diabetes with recently good control. Last A1c 7.4%. He has never had any hypoglycemia in the past couple years. No recent hospitalizations. No history of syncope.  Does have glaucoma followed by ophthalmology and plans to see them again soon. His other medical problems include history of BPH, spinal stenosis, stage III chronic kidney disease, chronic intermittent diarrhea, hypertension, and history of paroxysmal atrial fibrillation. Recent blood sugar stable. Remains on regimen of Januvia and DiaBeta.  Hypertension has been stable.   No recent dizziness.  Past Medical History  Diagnosis Date  . DIABETES MELLITUS, TYPE II 12/01/2008  . ESSENTIAL HYPERTENSION 12/01/2008  . ALLERGIC RHINITIS 12/01/2008  . GERD 12/01/2008  . GALLSTONES 04/07/2009  . SPONDYLOSIS, LUMBAR 12/01/2008  . EDEMA LEG 05/04/2009  . ABNORMAL THYROID FUNCTION TESTS 01/17/2010  . COLONIC POLYPS, HX OF 12/01/2008  . Hay fever   . Colon cancer ([place])   . Exocrine pancreatic insufficiency [facility])    Past Surgical History  Procedure Laterality Date  . Colon surgery      resection 1990 for cancer    reports that he quit smoking about 46 years ago. His smoking use included Cigarettes. He has a 20 pack-year smoking history. He does not have any smokeless tobacco history on file. He reports that he does not drink alcohol. His drug history is not on file. family history includes Cancer in his other; Diabetes in his other; Stroke in his other. Allergies  Allergen Reactions  . Amoxicillin     REACTION: rash, dizziness     Review of Systems  Constitutional: Positive for fatigue.  Eyes: Negative for visual disturbance.  Respiratory: Negative for cough, chest tightness and shortness of breath.   Cardiovascular: Negative for chest pain,  palpitations and leg swelling.  Endocrine: Negative for polydipsia and polyuria.  Genitourinary: Negative for dysuria.  Neurological: Negative for dizziness, syncope, weakness, light-headedness and headaches.       Objective:   Physical Exam  Constitutional: He is oriented to person, place, and time. He appears well-developed and well-nourished.  HENT:  Right Ear: External ear normal.  Left Ear: External ear normal.  Mouth/Throat: Oropharynx is clear and moist.  Eyes: Pupils are equal, round, and reactive to light.  Neck: Neck supple. No thyromegaly present.  Cardiovascular: Normal rate and regular rhythm.   Pulmonary/Chest: Effort normal and breath sounds normal. No respiratory distress. He has no wheezes. He has no rales.  Musculoskeletal: He exhibits no edema.  Neurological: He is alert and oriented to person, place, and time.          Assessment & Plan:  Type 2 diabetes. History of fairly good control. No recent hypoglycemia. He does not have any major retinopathy issues. He does have glaucoma followed by ophthalmology. Department of motor vehicle form completed. We did not see any obvious contraindications for driving at this time. 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 He does not have any history of cognitive impairment  Hypertension -stable.

## 2015-10-12 DIAGNOSIS — H401132 Primary open-angle glaucoma, bilateral, moderate stage: Secondary | ICD-10-CM | POA: Diagnosis not present

## 2015-10-12 DIAGNOSIS — H01021 Squamous blepharitis right upper eyelid: Secondary | ICD-10-CM | POA: Diagnosis not present

## 2015-10-12 DIAGNOSIS — Z961 Presence of intraocular lens: Secondary | ICD-10-CM | POA: Diagnosis not present

## 2015-10-12 DIAGNOSIS — H01024 Squamous blepharitis left upper eyelid: Secondary | ICD-10-CM | POA: Diagnosis not present

## 2015-10-12 DIAGNOSIS — H01022 Squamous blepharitis right lower eyelid: Secondary | ICD-10-CM | POA: Diagnosis not present

## 2015-10-12 DIAGNOSIS — H01025 Squamous blepharitis left lower eyelid: Secondary | ICD-10-CM | POA: Diagnosis not present

## 2015-10-13 ENCOUNTER — Encounter: Payer: Self-pay | Admitting: Podiatry

## 2015-10-13 ENCOUNTER — Ambulatory Visit (INDEPENDENT_AMBULATORY_CARE_PROVIDER_SITE_OTHER): Payer: PPO | Admitting: Podiatry

## 2015-10-13 DIAGNOSIS — M79676 Pain in unspecified toe(s): Secondary | ICD-10-CM | POA: Diagnosis not present

## 2015-10-13 DIAGNOSIS — B351 Tinea unguium: Secondary | ICD-10-CM

## 2015-10-13 NOTE — Progress Notes (Signed)
Patient ID: Steven Ward, male   DOB: 05/02/1925, 82 y.o.   MRN: [id number] Complaint:  Visit Type: Patient returns to my office for continued preventative foot care services. Complaint: Patient states" my nails have grown long and thick and become painful to walk and wear shoes" Patient has been diagnosed with DM with no  foot complications. He presents for preventative foot care services. No changes to ROS  Podiatric Exam: Vascular: dorsalis pedis and posterior tibial pulses are palpable bilateral. Capillary return is immediate. Temperature gradient is WNL. Skin turgor WNL  Sensorium: Normal Semmes Weinstein monofilament test. Normal tactile sensation bilaterally. Nail Exam: Pt has thick disfigured discolored nails with subungual debris noted bilateral entire nail hallux through fifth toenails Ulcer Exam: There is no evidence of ulcer or pre-ulcerative changes or infection. Orthopedic Exam: Muscle tone and strength are WNL. No limitations in general ROM. No crepitus or effusions noted. Foot type and digits show no abnormalities. Bony prominences are unremarkable. Skin: No Porokeratosis. No infection or ulcers  Diagnosis:  Tinea unguium, Pain in right toe, pain in left toes  Treatment & Plan Procedures and Treatment: Consent by patient was obtained for treatment procedures. The patient understood the discussion of treatment and procedures well. All questions were answered thoroughly reviewed. Debridement of mycotic and hypertrophic toenails, 1 through 5 bilateral and clearing of subungual debris. No ulceration, no infection noted.  Return Visit-Office Procedure: Patient instructed to return to the office for a follow up visit 10 weeks  for continued evaluation and treatment.   Gardiner Barefoot DPM  Gardiner Barefoot DPM

## 2015-12-23 ENCOUNTER — Encounter: Payer: Self-pay | Admitting: Podiatry

## 2015-12-23 ENCOUNTER — Ambulatory Visit (INDEPENDENT_AMBULATORY_CARE_PROVIDER_SITE_OTHER): Payer: PPO | Admitting: Podiatry

## 2015-12-23 ENCOUNTER — Ambulatory Visit: Payer: PPO | Admitting: Podiatry

## 2015-12-23 DIAGNOSIS — M79676 Pain in unspecified toe(s): Secondary | ICD-10-CM

## 2015-12-23 DIAGNOSIS — B351 Tinea unguium: Secondary | ICD-10-CM

## 2015-12-23 NOTE — Progress Notes (Signed)
Patient ID: Steven Ward, male   DOB: [birth year], 82 y.o.   MRN: [id number] Complaint:  Visit Type: Patient returns to my office for continued preventative foot care services. Complaint: Patient states" my nails have grown long and thick and become painful to walk and wear shoes" Patient has been diagnosed with DM with no  foot complications. He presents for preventative foot care services. No changes to ROS  Podiatric Exam: Vascular: dorsalis pedis and posterior tibial pulses are palpable bilateral. Capillary return is immediate. Temperature gradient is WNL. Skin turgor WNL  Sensorium: Normal Semmes Weinstein monofilament test. Normal tactile sensation bilaterally. Nail Exam: Pt has thick disfigured discolored nails with subungual debris noted bilateral entire nail hallux through fifth toenails Ulcer Exam: There is no evidence of ulcer or pre-ulcerative changes or infection. Orthopedic Exam: Muscle tone and strength are WNL. No limitations in general ROM. No crepitus or effusions noted. Foot type and digits show no abnormalities. Bony prominences are unremarkable. Skin: No Porokeratosis. No infection or ulcers  Diagnosis:  Tinea unguium, Pain in right toe, pain in left toes  Treatment & Plan Procedures and Treatment: Consent by patient was obtained for treatment procedures. The patient understood the discussion of treatment and procedures well. All questions were answered thoroughly reviewed. Debridement of mycotic and hypertrophic toenails, 1 through 5 bilateral and clearing of subungual debris. No ulceration, no infection noted.  Return Visit-Office Procedure: Patient instructed to return to the office for a follow up visit 10 weeks  for continued evaluation and treatment.   Gardiner Barefoot DPM  Gardiner Barefoot DPM

## 2015-12-24 ENCOUNTER — Ambulatory Visit (INDEPENDENT_AMBULATORY_CARE_PROVIDER_SITE_OTHER): Payer: PPO | Admitting: Family Medicine

## 2015-12-24 ENCOUNTER — Encounter: Payer: Self-pay | Admitting: Family Medicine

## 2015-12-24 VITALS — BP 120/70 | HR 80 | Temp 97.9°F | Ht 68.0 in | Wt 205.0 lb

## 2015-12-24 DIAGNOSIS — I10 Essential (primary) hypertension: Secondary | ICD-10-CM

## 2015-12-24 DIAGNOSIS — R197 Diarrhea, unspecified: Secondary | ICD-10-CM

## 2015-12-24 DIAGNOSIS — E1121 Type 2 diabetes mellitus with diabetic nephropathy: Secondary | ICD-10-CM

## 2015-12-24 LAB — LIPID PANEL
CHOL/HDL RATIO: 3
Cholesterol: 124 mg/dL (ref 0–200)
HDL: 36.4 mg/dL — ABNORMAL LOW (ref >39.00)
LDL Cholesterol: 75 mg/dL (ref 0–99)
NONHDL: 87.91
Triglycerides: 66 mg/dL (ref 0.0–149.0)
VLDL: 13.2 mg/dL (ref 0.0–40.0)

## 2015-12-24 LAB — HEPATIC FUNCTION PANEL
ALK PHOS: 71 U/L (ref 39–117)
ALT: 12 U/L (ref 0–53)
AST: 14 U/L (ref 0–37)
Albumin: 3.7 g/dL (ref 3.5–5.2)
BILIRUBIN DIRECT: 0.2 mg/dL (ref 0.0–0.3)
Total Bilirubin: 0.7 mg/dL (ref 0.2–1.2)
Total Protein: 6.4 g/dL (ref 6.0–8.3)

## 2015-12-24 LAB — BASIC METABOLIC PANEL
BUN: 19 mg/dL (ref 6–23)
CALCIUM: 9.1 mg/dL (ref 8.4–10.5)
CO2: 30 meq/L (ref 19–32)
CREATININE: 1.3 mg/dL (ref 0.40–1.50)
Chloride: 101 mEq/L (ref 96–112)
GFR: 55.26 mL/min — ABNORMAL LOW (ref >60.00)
GLUCOSE: 154 mg/dL — AB (ref 70–99)
Potassium: 4.5 mEq/L (ref 3.5–5.1)
Sodium: 137 mEq/L (ref 135–145)

## 2015-12-24 LAB — HEMOGLOBIN A1C: Hgb A1c MFr Bld: 7.3 % — ABNORMAL HIGH (ref 4.6–6.5)

## 2015-12-24 MED ORDER — METFORMIN HCL ER (OSM) 500 MG PO TB24: 500.0000 mg | ORAL_TABLET | Freq: Every day | ORAL | Status: DC

## 2015-12-24 NOTE — Addendum Note (Signed)
Addended by: Agnes Lawrence on: 12/24/2015 04:34 PM   Modules accepted: Orders, Medications

## 2015-12-24 NOTE — Progress Notes (Signed)
Pre visit review using our clinic review tool, if applicable. No additional management support is needed unless otherwise documented below in the visit note. 

## 2015-12-24 NOTE — Progress Notes (Signed)
   Subjective:    Patient ID: Steven Ward, male    DOB: 10-03-1924, 82 y.o.   MRN: [id number]  HPI Type 2 diabetes. Last A1c 7.4%. Fasting blood sugars usually less than 130. Currently takes Januvia and glyburide. No hypoglycemia. We held metformin years ago but he had increased diarrhea symptoms. Diarrhea never fully resolved even off metformin and patient was determined to have pancreatic enzyme deficiency. His diarrhea has improved on Pancrease enzymes Sometimes has cost issues with Januvia. Eye exam scheduled for next month. Saw podiatrist yesterday with no reported foot lesions  Hypertension treated with amlodipine, HCTZ, and Lotensin. No dizziness. No chest pains. No recent fall. Compliant with all medications  Past Medical History  Diagnosis Date  . DIABETES MELLITUS, TYPE II 12/01/2008  . ESSENTIAL HYPERTENSION 12/01/2008  . ALLERGIC RHINITIS 12/01/2008  . GERD 12/01/2008  . GALLSTONES 04/07/2009  . SPONDYLOSIS, LUMBAR 12/01/2008  . EDEMA LEG 05/04/2009  . ABNORMAL THYROID FUNCTION TESTS 01/17/2010  . COLONIC POLYPS, HX OF 12/01/2008  . Hay fever   . Colon cancer ([place])   . Exocrine pancreatic insufficiency [facility])    Past Surgical History  Procedure Laterality Date  . Colon surgery      resection 1990 for cancer    reports that he quit smoking about 47 years ago. His smoking use included Cigarettes. He has a 20 pack-year smoking history. He does not have any smokeless tobacco history on file. He reports that he does not drink alcohol. His drug history is not on file. family history includes Cancer in his other; Diabetes in his other; Stroke in his other. Allergies  Allergen Reactions  . Amoxicillin     REACTION: rash, dizziness      Review of Systems  Constitutional: Negative for appetite change, fatigue and unexpected weight change.  Eyes: Negative for visual disturbance.  Respiratory: Negative for cough, chest tightness and shortness of breath.   Cardiovascular:  Negative for chest pain, palpitations and leg swelling.  Gastrointestinal: Negative for abdominal pain.  Endocrine: Negative for polydipsia and polyuria.  Genitourinary: Negative for dysuria.  Musculoskeletal: Positive for back pain and arthralgias.  Neurological: Negative for dizziness, syncope, weakness, light-headedness and headaches.       Objective:   Physical Exam  Constitutional: He is oriented to person, place, and time. He appears well-developed and well-nourished.  HENT:  Right Ear: External ear normal.  Left Ear: External ear normal.  Mouth/Throat: Oropharynx is clear and moist.  Eyes: Pupils are equal, round, and reactive to light.  Neck: Neck supple. No thyromegaly present.  Cardiovascular: Normal rate and regular rhythm.   Pulmonary/Chest: Effort normal and breath sounds normal. No respiratory distress. He has no wheezes. He has no rales.  Musculoskeletal: He exhibits no edema.  Neurological: He is alert and oriented to person, place, and time.          Assessment & Plan:  #1 type 2 diabetes. History of fair control. Given his age, goal A1c less than 8. Recheck A1c today.  Patient requesting consideration for trial back on metformin extended release in place of Januvia. We explained we would like to confirm kidney function stable first.  #2 hypertension stable and well controlled. Recheck basic metabolic panel. He does have chronic kidney disease  #3 history of chronic intermittent diarrhea. Fairly well controlled on Creon

## 2016-01-05 DIAGNOSIS — R194 Change in bowel habit: Secondary | ICD-10-CM | POA: Diagnosis not present

## 2016-01-05 DIAGNOSIS — Z85038 Personal history of other malignant neoplasm of large intestine: Secondary | ICD-10-CM | POA: Diagnosis not present

## 2016-01-05 DIAGNOSIS — K8681 Exocrine pancreatic insufficiency: Secondary | ICD-10-CM | POA: Diagnosis not present

## 2016-01-07 ENCOUNTER — Telehealth: Payer: Self-pay | Admitting: Family Medicine

## 2016-01-07 NOTE — Telephone Encounter (Signed)
Pt is aware and agreeable

## 2016-01-07 NOTE — Telephone Encounter (Signed)
Pt states the new med he started 4/7  metformin (FORTAMET) 500 MG (OSM) 24 hr tablet Is not keeping his sugar down.  Numbers are running 130- 140 every am. Please advise   [facility]

## 2016-01-07 NOTE — Telephone Encounter (Signed)
I would give this another 2-3 weeks.  If blood sugars still up at that time he can let us know and- if no diarrhea - we could titrate up his Metformin to 750 mg.

## 2016-02-02 DIAGNOSIS — M545 Low back pain: Secondary | ICD-10-CM | POA: Diagnosis not present

## 2016-02-04 ENCOUNTER — Encounter: Payer: Self-pay | Admitting: Family Medicine

## 2016-02-04 ENCOUNTER — Ambulatory Visit (INDEPENDENT_AMBULATORY_CARE_PROVIDER_SITE_OTHER): Payer: PPO | Admitting: Family Medicine

## 2016-02-04 VITALS — BP 140/72 | HR 72 | Temp 97.9°F | Ht 68.0 in | Wt 201.6 lb

## 2016-02-04 DIAGNOSIS — E1121 Type 2 diabetes mellitus with diabetic nephropathy: Secondary | ICD-10-CM

## 2016-02-04 DIAGNOSIS — M4806 Spinal stenosis, lumbar region: Secondary | ICD-10-CM

## 2016-02-04 DIAGNOSIS — M48061 Spinal stenosis, lumbar region without neurogenic claudication: Secondary | ICD-10-CM

## 2016-02-04 DIAGNOSIS — R600 Localized edema: Secondary | ICD-10-CM | POA: Diagnosis not present

## 2016-02-04 MED ORDER — METFORMIN HCL ER 750 MG PO TB24: 750.0000 mg | ORAL_TABLET | Freq: Every day | ORAL | Status: DC

## 2016-02-04 NOTE — Progress Notes (Signed)
Subjective:    Patient ID: Steven Ward, male    DOB: [birth year], 82 y.o.   MRN: [id number]  HPI Seen for several items  Type 2 diabetes. Recently discontinued Januvia and started metformin extended release 500 mg once daily. Tolerating without side effects. Blood sugars ranging 135 to160 fasting. No polyuria or polydipsia  Increased bilateral leg edema past several weeks. No dietary changes. No orthopnea or exertional dyspnea. Weight is actually down 4 pounds from last visit. Edema always improves with elevation at night. Patient himself increased HCTZ to 25 mg daily yesterday He is on amlodipine which can call some edema. No history of known systolic dysfunction  Chronic low back pain with lumbar stenosis Has seen orthopedists in the past. Has had injections without much improvement. Current pain 7 out of 10 most days and starting to limit activities more Not relieved with Tylenol. He knows not to take regular nonsteroidals with his age and diabetes  Past Medical History  Diagnosis Date  . DIABETES MELLITUS, TYPE II 12/01/2008  . ESSENTIAL HYPERTENSION 12/01/2008  . ALLERGIC RHINITIS 12/01/2008  . GERD 12/01/2008  . GALLSTONES 04/07/2009  . SPONDYLOSIS, LUMBAR 12/01/2008  . EDEMA LEG 05/04/2009  . ABNORMAL THYROID FUNCTION TESTS 01/17/2010  . COLONIC POLYPS, HX OF 12/01/2008  . Hay fever   . Colon cancer ([place])   . Exocrine pancreatic insufficiency [facility])    Past Surgical History  Procedure Laterality Date  . Colon surgery      resection 1990 for cancer    reports that he quit smoking about 47 years ago. His smoking use included Cigarettes. He has a 20 pack-year smoking history. He does not have any smokeless tobacco history on file. He reports that he does not drink alcohol. His drug history is not on file. family history includes Cancer in his other; Diabetes in his other; Stroke in his other. Allergies  Allergen Reactions  . Amoxicillin     REACTION: rash, dizziness        Review of Systems  Constitutional: Positive for fatigue. Negative for appetite change and unexpected weight change.  Eyes: Negative for visual disturbance.  Respiratory: Negative for cough, chest tightness and shortness of breath.   Cardiovascular: Positive for leg swelling. Negative for chest pain and palpitations.  Endocrine: Negative for polydipsia and polyuria.  Musculoskeletal: Positive for back pain and arthralgias.  Neurological: Negative for dizziness, syncope, weakness, light-headedness and headaches.       Objective:   Physical Exam  Constitutional: He is oriented to person, place, and time. He appears well-developed and well-nourished.  HENT:  Right Ear: External ear normal.  Left Ear: External ear normal.  Mouth/Throat: Oropharynx is clear and moist.  Eyes: Pupils are equal, round, and reactive to light.  Neck: Neck supple. No JVD present. No thyromegaly present.  Cardiovascular: Normal rate and regular rhythm.   Pulmonary/Chest: Effort normal and breath sounds normal. No respiratory distress. He has no wheezes. He has no rales.  Musculoskeletal: He exhibits edema.  Patient has trace to 1+ pitting edema feet ankles and legs bilaterally  Neurological: He is alert and oriented to person, place, and time.          Assessment & Plan:  #1 type 2 diabetes. History of good control. Titrate metformin extended release to 750 mg once daily. We'll reassess A1c at follow-up in 2 months  #2 chronic low back pain secondary to lumbar stenosis. Not a surgical candidate. No relief with Tylenol. Starting to limit him  functionally. Already has tramadol at home but is not taking. He will try low-dose tramadol as needed  #3 bilateral leg edema. Suspect predominantly venous stasis. Continue increased dose HCTZ 25 mg once daily. Daily body weights. Be in touch if weight goes up more 3 pounds in 1 day or 5 pounds one week. May need to consider low-dose furosemide at least  short-term.  [name] MD [facility]

## 2016-02-04 NOTE — Progress Notes (Signed)
Pre visit review using our clinic review tool, if applicable. No additional management support is needed unless otherwise documented below in the visit note. 

## 2016-02-04 NOTE — Patient Instructions (Signed)
Take one whole HCTZ tablet once daily Monitor body weight daily.  Be in touch if weight goes up 3 pounds in one day or 5 pounds in one week.

## 2016-02-08 ENCOUNTER — Encounter: Payer: Self-pay | Admitting: Physical Therapy

## 2016-02-08 ENCOUNTER — Telehealth: Payer: Self-pay | Admitting: Family Medicine

## 2016-02-08 ENCOUNTER — Ambulatory Visit: Payer: PPO | Attending: Specialist | Admitting: Physical Therapy

## 2016-02-08 DIAGNOSIS — M6281 Muscle weakness (generalized): Secondary | ICD-10-CM | POA: Insufficient documentation

## 2016-02-08 MED ORDER — METFORMIN HCL ER 750 MG PO TB24: 750.0000 mg | ORAL_TABLET | Freq: Every day | ORAL | Status: DC

## 2016-02-08 NOTE — Telephone Encounter (Signed)
[facility]

## 2016-02-08 NOTE — Therapy (Signed)
[facility] [address] [place], [place], [address] Phone: [phone]   Fax:  [phone]  Physical Therapy Evaluation  Patient Details  Name: Steven Ward MRN: [id number] Date of Birth: 17-May-1925 Referring Provider: Dr. Basil Dess  Encounter Date: 02/08/2016      PT End of Session - 02/08/16 0936    Visit Number 1   Number of Visits 10   Date for PT Re-Evaluation 04/04/16   Authorization Type Medicare; g-code on 10th visit   PT Start Time 0930   PT Stop Time 1010   PT Time Calculation (min) 40 min   Activity Tolerance Patient tolerated treatment well   Behavior During Therapy [facility] for tasks assessed/performed      Past Medical History  Diagnosis Date  . DIABETES MELLITUS, TYPE II 12/01/2008  . ESSENTIAL HYPERTENSION 12/01/2008  . ALLERGIC RHINITIS 12/01/2008  . GERD 12/01/2008  . GALLSTONES 04/07/2009  . SPONDYLOSIS, LUMBAR 12/01/2008  . EDEMA LEG 05/04/2009  . ABNORMAL THYROID FUNCTION TESTS 01/17/2010  . COLONIC POLYPS, HX OF 12/01/2008  . Hay fever   . Colon cancer ([place])   . Exocrine pancreatic insufficiency [facility])     Past Surgical History  Procedure Laterality Date  . Colon surgery      resection 1990 for cancer    There were no vitals filed for this visit.       Subjective Assessment - 02/08/16 0938    Subjective Patient reports back pain 2 years ago with diagnosis of Spinal stenosis.  In the past 6 months has had increased pain.    Patient Stated Goals get around with greater ease   Currently in Pain? Yes   Pain Score 7    Pain Location Back   Pain Orientation Left;Right;Lower   Pain Descriptors / Indicators Aching   Pain Type Chronic pain   Pain Radiating Towards radiates down both legs   Pain Onset More than a month ago   Pain Frequency Constant   Aggravating Factors  standing, walking, cook ,    Pain Relieving Factors sit down   Effect of Pain on Daily Activities daily tasks   Multiple Pain Sites No            [facility] PT Assessment - 02/08/16 0001    Assessment   Medical Diagnosis Lumbar spinal stenosis   Referring Provider Dr. Basil Dess   Onset Date/Surgical Date 07/20/15   Prior Therapy yes   Precautions   Precautions Other (comment)   Precaution Comments cancer precautions   Restrictions   Weight Bearing Restrictions No   Balance Screen   Has the patient fallen in the past 6 months No   Has the patient had a decrease in activity level because of a fear of falling?  No   Is the patient reluctant to leave their home because of a fear of falling?  No   Home Ecologist residence   Prior Function   Level of Independence Independent   Vocation Retired   Leisure walking   Cognition   Overall Cognitive Status Within Functional Limits for tasks assessed   Observation/Other Assessments   Focus on Therapeutic Outcomes (FOTO)  50% limitation  goal is 41% limitation   Observation/Other Assessments-Edema    Edema --  pitting edema in lower leg   Posture/Postural Control   Posture/Postural Control Postural limitations   Postural Limitations Rounded Shoulders;Forward head;Decreased lumbar lordosis   ROM / Strength  AROM / PROM / Strength AROM;Strength   AROM   Lumbar Extension decreased by 50%   Lumbar - Right Side Bend decreased by 50%   Lumbar - Left Side Bend decreased by 25%   Strength   Right Hip Flexion 4/5   Right Hip Extension 3+/5   Right Hip ABduction 3+/5   Left Hip Flexion 4/5   Left Hip Extension 3+/5   Left Hip ABduction 3/5   Right Knee Flexion 4/5   Right Knee Extension 4/5   Left Knee Flexion 4/5   Left Knee Extension 4/5   Flexibility   Soft Tissue Assessment /Muscle Length yes   Hamstrings popliteal angle .eft 60 degress and right 40 degrees   Quadriceps bilateral tight   Piriformis decreased bilaterally                           PT Education - 02/08/16 0959     Education provided Yes   Education Details flexibility exercises   Person(s) Educated Patient   Methods Explanation;Demonstration;Verbal cues;Handout   Comprehension Returned demonstration;Verbalized understanding          PT Short Term Goals - 02/08/16 1006    PT SHORT TERM GOAL #1   Title indpendent with flexion of lumbar spine exercises   Time 4   Period Weeks   Status New   PT SHORT TERM GOAL #2   Title pain with standing for 15 minutes decreased >/= 25%   Time 4   Period Weeks   Status New   PT SHORT TERM GOAL #3   Title pain with walking in store decreased >/= 25%   Time 4   Period Weeks   Status New           PT Long Term Goals - 02/08/16 1000    PT LONG TERM GOAL #1   Title independent with HEP   Time 8   Period Weeks   Status New   PT LONG TERM GOAL #2   Title stand for 15 min with pain decreased >/= 50% with proper body mechanics   Time 8   Period Weeks   Status New   PT LONG TERM GOAL #3   Title walking for 30 min. for an exercise program due to pain decreased >/= 50% with proper posture   Time 8   Period Weeks   Status New   PT LONG TERM GOAL #4   Title be able to food shop with >/= 50% greater ease due to increased strength   Time 8   Period Weeks   Status New   PT LONG TERM GOAL #5   Title FOTO score </= 41% limitation   Time 8   Period Weeks   Status New               Plan - 02/08/16 1006    Clinical Impression Statement Patient is a 82 year old male with diagnosis of lumbar spinal stenosis for 2 years but worst in the last 6 monhts.  Patient reports his pain is constant in back and bilateral legs.  Pain is worse with standing, wakling.  Patient reports sitting decreases his pain.  Patient lumbar ROM is limited vy 50% for extension and right sidebending and 25% limitation with righ tsidebending.  Bilateral knee strength is 4/5.  Bilateral hip strength ranges between 3/5-4/5.  Patient stands with reduced lumbar lordosis and flexed  posture.  Patient ambulates with reduced  step length and slowly.  Patient has tight hamstring, quads and piriformis.  Patient is of moderate level due to back and hip pain that affects his walking and daily tasks and is evolving.  Patient  will benefit form physical therapy to redue pain and improve strength with mobility.    Rehab Potential Good   Clinical Impairments Affecting Rehab Potential None   PT Frequency 2x / week   PT Duration 8 weeks   PT Treatment/Interventions Cryotherapy;Electrical Stimulation;Traction;Therapeutic activities;Therapeutic exercise;Patient/family education;Neuromuscular re-education;Manual techniques;Passive range of motion;Dry needling   PT Next Visit Plan body mechanics with daily tasks, pain management, lumbar flexion exercises   PT Home Exercise Plan body mechanics with daily tasks   Recommended Other Services None   Consulted and Agree with Plan of Care Patient      Patient will benefit from skilled therapeutic intervention in order to improve the following deficits and impairments:  Abnormal gait, Pain, Difficulty walking, Decreased activity tolerance, Decreased endurance, Decreased range of motion, Decreased strength, Decreased mobility  Visit Diagnosis: Muscle weakness (generalized) - Plan: PT plan of care cert/re-cert      G-Codes - [id number]    Functional Assessment Tool Used FOTO score is 50% limitation  goal is 41% limitaiton   Functional Limitation Other PT primary   Other PT Primary Current Status [id number]) At least 40 percent but less than 60 percent impaired, limited or restricted   Other PT Primary Goal Status [id number]) At least 40 percent but less than 60 percent impaired, limited or restricted       Problem List Patient Active Problem List   Diagnosis Date Noted  . CKD stage 3 due to type 2 diabetes mellitus ([place]) 08/25/2015  . Spinal stenosis of lumbar region 05/25/2015  . BPH (benign prostatic hyperplasia) 11/10/2013  . Bilateral  lumbar radiculopathy 11/10/2013  . Obesity (BMI 30-39.9) 04/21/2013  . Dyspnea 06/23/2011  . PAF (paroxysmal atrial fibrillation) ([place]) 06/23/2011  . SKIN RASH 01/17/2010  . ABNORMAL THYROID FUNCTION TESTS 01/17/2010  . EDEMA LEG 05/04/2009  . GALLSTONES 04/07/2009  . ABDOMINAL PAIN RIGHT UPPER QUADRANT 04/07/2009  . DIARRHEA 02/26/2009  . Type 2 diabetes mellitus, controlled ([place]) 12/01/2008  . Essential hypertension 12/01/2008  . ALLERGIC RHINITIS 12/01/2008  . GERD 12/01/2008  . SPONDYLOSIS, LUMBAR 12/01/2008  . COLONIC POLYPS, HX OF 12/01/2008    Earlie Counts, PT 02/08/2016 10:15 AM   [facility] [address] [place], [place], [address] Phone: [phone]   Fax:  [phone]  Name: Steven Ward MRN: [id number] Date of Birth: 11-08-1924

## 2016-02-08 NOTE — Telephone Encounter (Signed)
[facility] to say [facility]   metFORMIN (GLUCOPHAGE-XR) 750 MG 24 hr tablet   FAX NUMBER [phone]

## 2016-02-08 NOTE — Patient Instructions (Signed)
Knee-to-Chest Stretch: Unilateral    With hand behind right knee, pull knee in to chest until a comfortable stretch is felt in lower back and buttocks. Keep back relaxed. Hold _15___ seconds. Repeat __2__ times per set. Do __1__ sets per session. Do __1__ sessions per day.  http://orth.exer.us/126   Copyright  VHI. All rights reserved.    Knee-to-Chest Stretch: Bilateral    With hands behind knees, pull both knees in to chest until a comfortable stretch is felt in lower back and buttocks. Keep back relaxed. Hold __15__ seconds. Repeat __2__ times per set. Do __1__ sets per session. Do _1___ sessions per day.  http://orth.exer.us/128   Copyright  VHI. All rights reserved.   Lumbar Rotation (Non-Weight Bearing)    Feet on floor, slowly rock knees from side to side in small, pain-free range of motion. Allow lower back to rotate slightly. Repeat _10___ times per set. Do ___1_ sets per session. Do __1__ sessions per day.  http://orth.exer.us/160   Copyright  VHI. All rights reserved.   Piriformis Stretch, Sitting    Sit, one ankle on opposite knee, same-side hand on crossed knee. Push down on knee, keeping spine straight. Lean torso forward, with flat back, until tension is felt in hamstrings and gluteals of crossed-leg side. Hold ___ seconds.  Repeat ___ times per session. Do ___ sessions per day.  Copyright  VHI. All rights reserved.   Chair Sitting    Sit at edge of seat, spine straight, one leg extended. Put a hand on each thigh and bend forward from the hip, keeping spine straight. Allow hand on extended leg to reach toward toes. Support upper body with other arm. Hold 30___ seconds. Repeat __2_ times per session. Do 1___ sessions per day.  Copyright  VHI. All rights reserved.  [facility] [address] [place], [place] [address] Phone # [phone] Fax [phone]

## 2016-02-09 ENCOUNTER — Ambulatory Visit (INDEPENDENT_AMBULATORY_CARE_PROVIDER_SITE_OTHER): Payer: PPO | Admitting: Podiatry

## 2016-02-09 ENCOUNTER — Encounter: Payer: Self-pay | Admitting: Podiatry

## 2016-02-09 DIAGNOSIS — L84 Corns and callosities: Secondary | ICD-10-CM | POA: Diagnosis not present

## 2016-02-09 NOTE — Progress Notes (Signed)
Subjective:     Patient ID: Steven Ward, male   DOB: 1925-07-20, 82 y.o.   MRN: [id number]  HPI this patient presents to the office with a painful callus under the ball of his right foot. He states he injured his big toe 6-7 months ago and since then has been forming this callus. He states the callus is not painful when he walks, but since he is a diabetic h e is concerned about its presence. He presents the office today for an evaluation and treatment of this condition   Review of Systems     Objective:   Physical Exam Neurovascular status intact.  He has developed an asymptomatic fissure under the ball of his right foot.There is callus noted at the medial most portion of the fissure. No redness or swelling.    Assessment:     Callus right foot     Plan:    ROV  Debride callus right foot.  Told him to  moisturize his right foot. RTC for preventive foot care services as scheduled.   Gardiner Barefoot DPM

## 2016-02-10 ENCOUNTER — Ambulatory Visit: Payer: PPO

## 2016-02-10 DIAGNOSIS — M6281 Muscle weakness (generalized): Secondary | ICD-10-CM | POA: Diagnosis not present

## 2016-02-10 NOTE — Therapy (Addendum)
[facility] [address], [place], [place], [address] Phone: [phone]   Fax:  [phone]  Physical Therapy Treatment  Patient Details  Name: Steven Ward MRN: [id number] Date of Birth: December 11, 1924 Referring Provider: Dr. Basil Dess  Encounter Date: 02/10/2016      PT End of Session - 02/10/16 1057    Visit Number 2   Number of Visits 10   Date for PT Re-Evaluation 04/04/16   Authorization Type Medicare; g-code on 10th visit   PT Start Time 1010   PT Stop Time 1054   PT Time Calculation (min) 44 min   Activity Tolerance Patient tolerated treatment well   Behavior During Therapy [facility] for tasks assessed/performed      Past Medical History  Diagnosis Date  . DIABETES MELLITUS, TYPE II 12/01/2008  . ESSENTIAL HYPERTENSION 12/01/2008  . ALLERGIC RHINITIS 12/01/2008  . GERD 12/01/2008  . GALLSTONES 04/07/2009  . SPONDYLOSIS, LUMBAR 12/01/2008  . EDEMA LEG 05/04/2009  . ABNORMAL THYROID FUNCTION TESTS 01/17/2010  . COLONIC POLYPS, HX OF 12/01/2008  . Hay fever   . Colon cancer ([place])   . Exocrine pancreatic insufficiency [facility])     Past Surgical History  Procedure Laterality Date  . Colon surgery      resection 1990 for cancer    There were no vitals filed for this visit.      Subjective Assessment - 02/10/16 1017    Subjective Exercises are going OK.     Patient Stated Goals get around with greater ease   Currently in Pain? Yes   Pain Score 5    Pain Location Back   Pain Orientation Right;Left;Lower   Pain Descriptors / Indicators Aching   Pain Type Chronic pain   Pain Radiating Towards both legs   Pain Onset More than a month ago   Pain Frequency Constant   Aggravating Factors  standing for a long time   Pain Relieving Factors sitting down       G-code: functional assessment tool used is FOTO score is 50% limitation, functional limitation is other PT primary, goal is CK, Discharge is CK.   Earlie Counts, PT 04/05/2016 10:44 AM                    [facility] Adult PT Treatment/Exercise - 02/10/16 0001    Exercises   Exercises Knee/Hip;Lumbar   Lumbar Exercises: Stretches   Active Hamstring Stretch 3 reps;20 seconds   Single Knee to Chest Stretch 3 reps;20 seconds   Double Knee to Chest Stretch 3 reps;20 seconds   Lower Trunk Rotation 3 reps;20 seconds   Piriformis Stretch 3 reps;20 seconds  seated   Knee/Hip Exercises: Aerobic   Nustep Level 1 x 8 minutes  seat 9, arms 10   Knee/Hip Exercises: Standing   Rocker Board 3 minutes   Knee/Hip Exercises: Seated   Long Arc Quad Strengthening;Left;2 sets;10 reps   Marching Strengthening;Both;2 sets;10 reps                PT Education - 02/10/16 1030    Education provided Yes   Education Details Economist education   Person(s) Educated Patient   Methods Explanation;Demonstration;Handout   Comprehension Verbalized understanding          PT Short Term Goals - 02/08/16 1006    PT SHORT TERM GOAL #1   Title indpendent with flexion of lumbar spine exercises   Time 4   Period Weeks  Status New   PT SHORT TERM GOAL #2   Title pain with standing for 15 minutes decreased >/= 25%   Time 4   Period Weeks   Status New   PT SHORT TERM GOAL #3   Title pain with walking in store decreased >/= 25%   Time 4   Period Weeks   Status New           PT Long Term Goals - 02/08/16 1000    PT LONG TERM GOAL #1   Title independent with HEP   Time 8   Period Weeks   Status New   PT LONG TERM GOAL #2   Title stand for 15 min with pain decreased >/= 50% with proper body mechanics   Time 8   Period Weeks   Status New   PT LONG TERM GOAL #3   Title walking for 30 min. for an exercise program due to pain decreased >/= 50% with proper posture   Time 8   Period Weeks   Status New   PT LONG TERM GOAL #4   Title be able to food shop with >/= 50% greater ease due to increased strength   Time 8    Period Weeks   Status New   PT LONG TERM GOAL #5   Title FOTO score </= 41% limitation   Time 8   Period Weeks   Status New               Plan - 02/10/16 1024    Clinical Impression Statement Pt with only 1 session after evaluation.  Pt with good demonstration of HEP issued for flexiblity.  Pt with limited ability to stand due to pain associated with stenosis.  Pt with Rt>Lt hip stiffness and weakness bilaterally.  Pt will continue to benefit from skilled PT for strength, flexiblity and body mechanics modifications.     Rehab Potential Good   PT Frequency 2x / week   PT Duration 8 weeks   PT Treatment/Interventions Cryotherapy;Electrical Stimulation;Traction;Therapeutic activities;Therapeutic exercise;Patient/family education;Neuromuscular re-education;Manual techniques;Passive range of motion;Dry needling   PT Next Visit Plan body mechanics with daily tasks, pain management, lumbar flexion exercises   Consulted and Agree with Plan of Care Patient      Patient will benefit from skilled therapeutic intervention in order to improve the following deficits and impairments:  Abnormal gait, Pain, Difficulty walking, Decreased activity tolerance, Decreased endurance, Decreased range of motion, Decreased strength, Decreased mobility  Visit Diagnosis: Muscle weakness (generalized)     Problem List Patient Active Problem List   Diagnosis Date Noted  . CKD stage 3 due to type 2 diabetes mellitus ([place]) 08/25/2015  . Spinal stenosis of lumbar region 05/25/2015  . BPH (benign prostatic hyperplasia) 11/10/2013  . Bilateral lumbar radiculopathy 11/10/2013  . Obesity (BMI 30-39.9) 04/21/2013  . Dyspnea 06/23/2011  . PAF (paroxysmal atrial fibrillation) ([name]) 06/23/2011  . SKIN RASH 01/17/2010  . ABNORMAL THYROID FUNCTION TESTS 01/17/2010  . EDEMA LEG 05/04/2009  . GALLSTONES 04/07/2009  . ABDOMINAL PAIN RIGHT UPPER QUADRANT 04/07/2009  . DIARRHEA 02/26/2009  . Type 2 diabetes  mellitus, controlled ([name]) 12/01/2008  . Essential hypertension 12/01/2008  . ALLERGIC RHINITIS 12/01/2008  . GERD 12/01/2008  . SPONDYLOSIS, LUMBAR 12/01/2008  . COLONIC POLYPS, HX OF 12/01/2008     Sigurd Sos, PT 02/10/2016 10:58 AM  [facility] [address] [place], [place], [address] Phone: [phone]   Fax:  [phone]  Name: TALMADGE GANAS MRN: [id number] Date of Birth: 01-28-1925   PHYSICAL THERAPY DISCHARGE SUMMARY  Visits from Start of Care: 2  Current functional level related to goals / functional outcomes: See above. Patient did not return therefore unable to assess.    Remaining deficits: See above.    Education / Equipment: HEP Plan:                                                    Patient goals were not met. Patient is being discharged due to not returning since the last visit. Thank you for the referral. Earlie Counts, PT 04/05/2016 10:47 AM   ?????

## 2016-02-10 NOTE — Patient Instructions (Signed)
   Lifting Principles  Maintain proper posture and head alignment. Slide object as close as possible before lifting. Move obstacles out of the way. Test before lifting; ask for help if too heavy. Tighten stomach muscles without holding breath. Use smooth movements; do not jerk. Use legs to do the work, and pivot with feet. Distribute the work load symmetrically and close to the center of trunk. Push instead of pull whenever possible.   Squat down and hold basket close to stand. Use leg muscles to do the work.    Avoid twisting or bending back. Pivot around using foot movements, and bend at knees if needed when reaching for articles.        Getting Into / Out of Bed   Lower self to lie down on one side by raising legs and lowering head at the same time. Use arms to assist moving without twisting. Bend both knees to roll onto back if desired. To sit up, start from lying on side, and use same move-ments in reverse. Keep trunk aligned with legs.    Shift weight from front foot to back foot as item is lifted off shelf.    When leaning forward to pick object up from floor, extend one leg out behind. Keep back straight. Hold onto a sturdy support with other hand.      Sit upright, head facing forward. Try using a roll to support lower back. Keep shoulders relaxed, and avoid rounded back. Keep hips level with knees. Avoid crossing legs for long periods.     [facility] [address], [address] [place], [place] [address] Phone # [phone] Fax [phone]  

## 2016-02-11 DIAGNOSIS — H401132 Primary open-angle glaucoma, bilateral, moderate stage: Secondary | ICD-10-CM | POA: Diagnosis not present

## 2016-02-11 DIAGNOSIS — H01024 Squamous blepharitis left upper eyelid: Secondary | ICD-10-CM | POA: Diagnosis not present

## 2016-02-11 DIAGNOSIS — H01025 Squamous blepharitis left lower eyelid: Secondary | ICD-10-CM | POA: Diagnosis not present

## 2016-02-11 DIAGNOSIS — H01021 Squamous blepharitis right upper eyelid: Secondary | ICD-10-CM | POA: Diagnosis not present

## 2016-02-11 DIAGNOSIS — Z961 Presence of intraocular lens: Secondary | ICD-10-CM | POA: Diagnosis not present

## 2016-02-11 DIAGNOSIS — H01022 Squamous blepharitis right lower eyelid: Secondary | ICD-10-CM | POA: Diagnosis not present

## 2016-02-15 ENCOUNTER — Encounter: Payer: PPO | Admitting: Physical Therapy

## 2016-02-23 ENCOUNTER — Encounter: Payer: Self-pay | Admitting: Family Medicine

## 2016-02-23 ENCOUNTER — Ambulatory Visit (INDEPENDENT_AMBULATORY_CARE_PROVIDER_SITE_OTHER): Payer: PPO | Admitting: Family Medicine

## 2016-02-23 VITALS — BP 110/70 | HR 97 | Temp 97.3°F | Ht 68.0 in | Wt 196.0 lb

## 2016-02-23 DIAGNOSIS — M4806 Spinal stenosis, lumbar region: Secondary | ICD-10-CM

## 2016-02-23 DIAGNOSIS — M5416 Radiculopathy, lumbar region: Secondary | ICD-10-CM | POA: Diagnosis not present

## 2016-02-23 DIAGNOSIS — E1121 Type 2 diabetes mellitus with diabetic nephropathy: Secondary | ICD-10-CM

## 2016-02-23 DIAGNOSIS — M48061 Spinal stenosis, lumbar region without neurogenic claudication: Secondary | ICD-10-CM

## 2016-02-23 MED ORDER — TRAMADOL HCL 50 MG PO TABS: 50.0000 mg | ORAL_TABLET | Freq: Four times a day (QID) | ORAL | Status: DC | PRN

## 2016-02-23 NOTE — Patient Instructions (Signed)

## 2016-02-23 NOTE — Progress Notes (Signed)
Subjective:    Patient ID: Steven Ward, male    DOB: Jun 23, 1925, 82 y.o.   MRN: [id number]  HPI Patient here to discuss chronic back pain with recent right lumbar radiculopathy symptoms. He has no history of severe lumbar stenosis by previous MRI scan 2015. He had epidural injections which did not help. He takes tramadol infrequently. He recently saw orthopedic surgeon and they put him in physical therapy but he only went a couple times and had increased pain and quit going. They recommended continuing tramadol. They seem more concerned about his leg edema. He does have history of chronic kidney disease but this has been very stable and he does not have any history of known heart failure. We do suspect highly that he has significant venous stasis and overall his edema is very stable. He denies any orthopnea.  Current pain is bilateral lumbar area radiation toward buttocks right lower extremity greater than left. 7 out of 10 pain at its worst.  Worse with ambulation. This is limiting his walking. He is unfortunately had some chronic constipation and takes MiraLAX daily.  Denies any progressive weakness. No loss of urine or stool control.  Type 2 diabetes. A1c has been consistently low 7 range. No hypoglycemia. Recent fasting glucose run 130.  Past Medical History  Diagnosis Date  . DIABETES MELLITUS, TYPE II 12/01/2008  . ESSENTIAL HYPERTENSION 12/01/2008  . ALLERGIC RHINITIS 12/01/2008  . GERD 12/01/2008  . GALLSTONES 04/07/2009  . SPONDYLOSIS, LUMBAR 12/01/2008  . EDEMA LEG 05/04/2009  . ABNORMAL THYROID FUNCTION TESTS 01/17/2010  . COLONIC POLYPS, HX OF 12/01/2008  . Hay fever   . Colon cancer ([place])   . Exocrine pancreatic insufficiency [facility])    Past Surgical History  Procedure Laterality Date  . Colon surgery      resection 1990 for cancer    reports that he quit smoking about 47 years ago. His smoking use included Cigarettes. He has a 20 pack-year smoking history. He does not have  any smokeless tobacco history on file. He reports that he does not drink alcohol. His drug history is not on file. family history includes Cancer in his other; Diabetes in his other; Stroke in his other. Allergies  Allergen Reactions  . Amoxicillin     REACTION: rash, dizziness       Review of Systems  Constitutional: Negative for appetite change, fatigue and unexpected weight change.  Eyes: Negative for visual disturbance.  Respiratory: Negative for cough, chest tightness and shortness of breath.   Cardiovascular: Positive for leg swelling. Negative for chest pain and palpitations.  Gastrointestinal: Negative for abdominal pain.  Endocrine: Negative for polydipsia and polyuria.  Musculoskeletal: Positive for back pain.  Neurological: Negative for dizziness, syncope, light-headedness and headaches.       Objective:   Physical Exam  Constitutional: He is oriented to person, place, and time. He appears well-developed and well-nourished.  Cardiovascular: Normal rate and regular rhythm.   Pulmonary/Chest: Effort normal and breath sounds normal. No respiratory distress. He has no wheezes. He has no rales.  Musculoskeletal: He exhibits edema.  Trace edema legs feet ankles laterally  Neurological: He is alert and oriented to person, place, and time.  No focal strength deficits lower extremities. Normal sensory function to touch.  Psychiatric: He has a normal mood and affect. His behavior is normal.          Assessment & Plan:  #1 lumbar stenosis. He has severe multilevel degenerative arthritis in his lumbar  spine with recent increase in right radiculopathy symptoms. No focal weakness. We discussed pain management. Unfortunately he already has sniffing constipation so we've recommended trying to avoid going hydrocodone or oxycodone. He has not been taking tramadol but has had some relief in the past and new prescription was provided. He declines further physical therapy. He has not  gotten benefits from epidural injections in the past. May supplement with Tylenol as needed  #2 type 2 diabetes. History of fair to good control. Continue home monitoring. Recheck A1c at follow-up in August.  [name] MD [facility] at [facility]

## 2016-03-02 ENCOUNTER — Encounter: Payer: Self-pay | Admitting: Podiatry

## 2016-03-02 ENCOUNTER — Ambulatory Visit (INDEPENDENT_AMBULATORY_CARE_PROVIDER_SITE_OTHER): Payer: PPO | Admitting: Podiatry

## 2016-03-02 DIAGNOSIS — M79676 Pain in unspecified toe(s): Secondary | ICD-10-CM | POA: Diagnosis not present

## 2016-03-02 DIAGNOSIS — B351 Tinea unguium: Secondary | ICD-10-CM | POA: Diagnosis not present

## 2016-03-02 NOTE — Progress Notes (Signed)
Patient ID: Steven Ward, male   DOB: 14-Feb-1925, 82 y.o.   MRN: [id number] Complaint:  Visit Type: Patient returns to my office for continued preventative foot Ward services. Complaint: Patient states" my nails have grown long and thick and become painful to walk and wear shoes" Patient has been diagnosed with DM with no  foot complications. He presents for preventative foot Ward services. No changes to ROS  Podiatric Exam: Vascular: dorsalis pedis and posterior tibial pulses are palpable bilateral. Capillary return is immediate. Temperature gradient is WNL. Skin turgor WNL  Sensorium: Normal Semmes Weinstein monofilament test. Normal tactile sensation bilaterally. Nail Exam: Pt has thick disfigured discolored nails with subungual debris noted bilateral entire nail hallux through fifth toenails Ulcer Exam: There is no evidence of ulcer or pre-ulcerative changes or infection. Orthopedic Exam: Muscle tone and strength are WNL. No limitations in general ROM. No crepitus or effusions noted. Foot type and digits show no abnormalities. Bony prominences are unremarkable. Skin: No Porokeratosis. No infection or ulcers  Diagnosis:  Tinea unguium, Pain in right toe, pain in left toes  Treatment & Plan Procedures and Treatment: Consent by patient was obtained for treatment procedures. The patient understood the discussion of treatment and procedures well. All questions were answered thoroughly reviewed. Debridement of mycotic and hypertrophic toenails, 1 through 5 bilateral and clearing of subungual debris. No ulceration, no infection noted.  Return Visit-Office Procedure: Patient instructed to return to the office for a follow up visit 10 weeks  for continued evaluation and treatment.   Gardiner Barefoot DPM  Gardiner Barefoot DPM

## 2016-03-27 ENCOUNTER — Telehealth: Payer: Self-pay | Admitting: Family Medicine

## 2016-03-27 MED ORDER — HYDROCHLOROTHIAZIDE 25 MG PO TABS: ORAL_TABLET | ORAL | Status: DC

## 2016-03-27 NOTE — Telephone Encounter (Signed)
Pt need new Rx for hydrochlorothiazide    Pharm:  [facility] [address]   Pt is totally out.

## 2016-03-27 NOTE — Telephone Encounter (Signed)
Medication sent in for patient. 

## 2016-04-19 ENCOUNTER — Encounter: Payer: Self-pay | Admitting: Family Medicine

## 2016-04-19 ENCOUNTER — Ambulatory Visit (INDEPENDENT_AMBULATORY_CARE_PROVIDER_SITE_OTHER): Payer: PPO | Admitting: Family Medicine

## 2016-04-19 VITALS — BP 110/60 | HR 85 | Temp 97.8°F | Ht 68.0 in | Wt 192.9 lb

## 2016-04-19 DIAGNOSIS — F329 Major depressive disorder, single episode, unspecified: Secondary | ICD-10-CM

## 2016-04-19 DIAGNOSIS — R5383 Other fatigue: Secondary | ICD-10-CM

## 2016-04-19 DIAGNOSIS — E1122 Type 2 diabetes mellitus with diabetic chronic kidney disease: Secondary | ICD-10-CM | POA: Diagnosis not present

## 2016-04-19 DIAGNOSIS — N183 Chronic kidney disease, stage 3 (moderate): Secondary | ICD-10-CM

## 2016-04-19 DIAGNOSIS — E1121 Type 2 diabetes mellitus with diabetic nephropathy: Secondary | ICD-10-CM | POA: Diagnosis not present

## 2016-04-19 DIAGNOSIS — I10 Essential (primary) hypertension: Secondary | ICD-10-CM | POA: Diagnosis not present

## 2016-04-19 LAB — BASIC METABOLIC PANEL
BUN: 24 mg/dL — AB (ref 6–23)
CALCIUM: 9.5 mg/dL (ref 8.4–10.5)
CHLORIDE: 104 meq/L (ref 96–112)
CO2: 23 meq/L (ref 19–32)
CREATININE: 1.32 mg/dL (ref 0.40–1.50)
GFR: 54.25 mL/min — ABNORMAL LOW (ref >60.00)
GLUCOSE: 158 mg/dL — AB (ref 70–99)
Potassium: 4.5 mEq/L (ref 3.5–5.1)
Sodium: 139 mEq/L (ref 135–145)

## 2016-04-19 LAB — CBC WITH DIFFERENTIAL/PLATELET
BASOS ABS: 0 10*3/uL (ref 0.0–0.1)
BASOS PCT: 0.4 % (ref 0.0–3.0)
EOS ABS: 0.2 10*3/uL (ref 0.0–0.7)
EOS PCT: 2.4 % (ref 0.0–5.0)
HCT: 40.9 % (ref 39.0–52.0)
HEMOGLOBIN: 13.6 g/dL (ref 13.0–17.0)
LYMPHS PCT: 24 % (ref 12.0–46.0)
Lymphs Abs: 2.1 10*3/uL (ref 0.7–4.0)
MCHC: 33.4 g/dL (ref 30.0–36.0)
MCV: 94.2 fl (ref 78.0–100.0)
MONO ABS: 0.6 10*3/uL (ref 0.1–1.0)
MONOS PCT: 7 % (ref 3.0–12.0)
Neutro Abs: 5.7 10*3/uL (ref 1.4–7.7)
Neutrophils Relative %: 66.2 % (ref 43.0–77.0)
PLATELETS: 205 10*3/uL (ref 150.0–400.0)
RBC: 4.34 Mil/uL (ref 4.22–5.81)
RDW: 14.4 % (ref 11.5–15.5)
WBC: 8.6 10*3/uL (ref 4.0–10.5)

## 2016-04-19 LAB — TSH: TSH: 5.72 u[IU]/mL — AB (ref 0.35–4.50)

## 2016-04-19 LAB — HEMOGLOBIN A1C: HEMOGLOBIN A1C: 6.8 % — AB (ref 4.6–6.5)

## 2016-04-19 LAB — VITAMIN B12: VITAMIN B 12: 350 pg/mL (ref 211–911)

## 2016-04-19 MED ORDER — METFORMIN HCL ER 750 MG PO TB24: 750.0000 mg | ORAL_TABLET | Freq: Every day | ORAL | 3 refills | Status: DC

## 2016-04-19 MED ORDER — SERTRALINE HCL 50 MG PO TABS: 50.0000 mg | ORAL_TABLET | Freq: Every day | ORAL | 3 refills | Status: DC

## 2016-04-19 NOTE — Progress Notes (Signed)
Subjective:     Patient ID: Steven Ward, male   DOB: [birth year], 82 y.o.   MRN: [id number]  HPI Here for multiple items. He was scheduled initially for routine medical follow-up. He has several other items as below.  Type 2 diabetes. History of good control. Last A1c 7.3%. Not monitoring blood sugars regularly. No polyuria or polydipsia  Patient has decreased appetite and some weight loss. Weight is down almost 13 pounds since April. He has depressed mood. He is mostly down he attributes to the fact that he has had severe lumbar stenosis and is unable to do several things he used to do. No suicidal ideation. Much less active. Lower motivation. Generally sleeping okay  Increased fatigue symptoms. Denies any chest pain. He has some chronic dyspnea with exertion which is unchanged. No recent fevers or chills. No headaches. No abdominal pain. No dysuria.  Patient incidentally had tick bite right forearm which we noted on exam today. No recent headache, fever, or rash  Patient has noticed some recent upper extremity tremor. Is not aware of family history. Does not clearly extinguish with activity. He is not aware of any exacerbating or alleviating factors  Past Medical History:  Diagnosis Date  . ABNORMAL THYROID FUNCTION TESTS 01/17/2010  . ALLERGIC RHINITIS 12/01/2008  . Colon cancer ([place])   . COLONIC POLYPS, HX OF 12/01/2008  . DIABETES MELLITUS, TYPE II 12/01/2008  . EDEMA LEG 05/04/2009  . ESSENTIAL HYPERTENSION 12/01/2008  . Exocrine pancreatic insufficiency ([place])   . GALLSTONES 04/07/2009  . GERD 12/01/2008  . Hay fever   . SPONDYLOSIS, LUMBAR 12/01/2008   Past Surgical History:  Procedure Laterality Date  . COLON SURGERY     resection 1990 for cancer    reports that he quit smoking about 47 years ago. His smoking use included Cigarettes. He has a 20.00 pack-year smoking history. He does not have any smokeless tobacco history on file. He reports that he does not drink alcohol. His  drug history is not on file. family history includes Cancer in his other; Diabetes in his other; Stroke in his other. Allergies  Allergen Reactions  . Amoxicillin     REACTION: rash, dizziness     Review of Systems  Constitutional: Positive for appetite change, fatigue and unexpected weight change. Negative for chills and fever.  Respiratory: Negative for cough and shortness of breath.   Cardiovascular: Negative for chest pain and palpitations.  Gastrointestinal: Negative for abdominal pain.  Genitourinary: Negative for dysuria.  Musculoskeletal: Positive for back pain.  Neurological: Positive for tremors. Negative for dizziness, speech difficulty and headaches.  Hematological: Negative for adenopathy.  Psychiatric/Behavioral: Positive for dysphoric mood. Negative for sleep disturbance and suicidal ideas.       Objective:   Physical Exam  Constitutional: He is oriented to person, place, and time. He appears well-developed and well-nourished.  HENT:  Right Ear: External ear normal.  Left Ear: External ear normal.  Mouth/Throat: Oropharynx is clear and moist.  Neck: Neck supple. No thyromegaly present.  Cardiovascular: Normal rate and regular rhythm.   Pulmonary/Chest: Effort normal and breath sounds normal. No respiratory distress. He has no wheezes. He has no rales.  Musculoskeletal:  Trace edema legs bilaterally  Lymphadenopathy:    He has no cervical adenopathy.  Neurological: He is alert and oriented to person, place, and time. No cranial nerve deficit.  Skin:  Regular sized "wood" tick bitten in to mid-dorsal right forearm.  Removed with no difficulty.  Psychiatric:  Patient  has flat affect and depressed mood       Assessment:     #1 type 2 diabetes. History of fair control  #2 fatigue. Question multifactorial. Question related to depression. Rule out other etiologies  #3 tick bite right arm noted on exam today. No other associated symptoms or findings  #4  chronic kidney disease  #5 mild upper extremity tremor. Question essential tremor. No other clear features of Parkinson's     Plan:     -Check further labs with B12, hemoglobin A1c, TSH, CBC, basic metabolic panel -PH Q-9 questionnaire administered.  He scored 24!   -start Sertraline 50 mg qd and reassess in 3 weeks. -follow up promptly for any fever, headache , or rash from tick bite.  [name] MD [facility] at [facility]

## 2016-04-19 NOTE — Patient Instructions (Signed)
Leave off the Tramadol for now.

## 2016-04-20 ENCOUNTER — Telehealth: Payer: Self-pay | Admitting: Family Medicine

## 2016-04-20 NOTE — Telephone Encounter (Signed)
Patient has been given instructions and he verbalized understanding.

## 2016-04-20 NOTE — Telephone Encounter (Signed)
Patient Name: Steven Ward DOB: 10/03/1924 Initial Comment Caller says new Rx yesterday, gives him diarrhea Nurse Assessment Nurse: Vallery Sa, RN, Cathy Date/Time (Eastern Time): 04/20/2016 9:01:56 AM Confirm and document reason for call. If symptomatic, describe symptoms. You must click the next button to save text entered. ---Steven Ward states that he has had about 3 episodes of diarrhea since about 2am this morning. He started taking Sertraline last night. No fever or vomiting. Alert and responsive. No pain. Has the patient traveled out of the country within the last 30 days? ---No Does the patient have any new or worsening symptoms? ---Yes Will a triage be completed? ---Yes Related visit to physician within the last 2 weeks? ---Yes Does the PT have any chronic conditions? (i.e. diabetes, asthma, etc.) ---Yes List chronic conditions. ---Started Sertaline, Diabetes, Spinal Stenosis Is this a behavioral health or substance abuse call? ---No Guidelines Guideline Title Affirmed Question Affirmed Notes Diarrhea MILD-MODERATE diarrhea (e.g., 1-6 times / day more than normal) (all triage questions negative) Final Disposition User Canyon, RN, Tye [place] Comments Per [employer] on Sertraline: Major Side Effects If any of the following side effects occur while taking sertraline, check with your doctor immediately: diarrhea Called the office backline and notified Arbie Cookey who will follow upwith the office Nurse and have her call Steven Ward with further direction regarding his Sertraline and diarrhea. Mert notified. Disagree/Comply: Comply

## 2016-04-20 NOTE — Telephone Encounter (Signed)
Rad asks to called back at [phone]

## 2016-04-20 NOTE — Telephone Encounter (Signed)
Would have him reduce to one half tablet daily and take with food and do this for 5 days and then increase to one tablet daily (if no further diarrhea)  Have him let me know in 4-5 days if diarrhea not resolving.

## 2016-04-21 ENCOUNTER — Ambulatory Visit: Payer: PPO | Admitting: Family Medicine

## 2016-05-10 ENCOUNTER — Encounter: Payer: Self-pay | Admitting: Family Medicine

## 2016-05-10 ENCOUNTER — Ambulatory Visit (INDEPENDENT_AMBULATORY_CARE_PROVIDER_SITE_OTHER): Payer: PPO | Admitting: Family Medicine

## 2016-05-10 VITALS — BP 118/64 | HR 85 | Temp 98.1°F | Ht 68.0 in | Wt 191.6 lb

## 2016-05-10 DIAGNOSIS — M47817 Spondylosis without myelopathy or radiculopathy, lumbosacral region: Secondary | ICD-10-CM | POA: Diagnosis not present

## 2016-05-10 DIAGNOSIS — E1121 Type 2 diabetes mellitus with diabetic nephropathy: Secondary | ICD-10-CM | POA: Diagnosis not present

## 2016-05-10 DIAGNOSIS — R946 Abnormal results of thyroid function studies: Secondary | ICD-10-CM

## 2016-05-10 DIAGNOSIS — R5383 Other fatigue: Secondary | ICD-10-CM | POA: Diagnosis not present

## 2016-05-10 DIAGNOSIS — F329 Major depressive disorder, single episode, unspecified: Secondary | ICD-10-CM

## 2016-05-10 DIAGNOSIS — R7989 Other specified abnormal findings of blood chemistry: Secondary | ICD-10-CM

## 2016-05-10 DIAGNOSIS — M48061 Spinal stenosis, lumbar region without neurogenic claudication: Secondary | ICD-10-CM

## 2016-05-10 DIAGNOSIS — M4806 Spinal stenosis, lumbar region: Secondary | ICD-10-CM

## 2016-05-10 NOTE — Patient Instructions (Signed)
Try to go ahead and increase Sertraline to one whole tablet at night Let's plan on 3 months follow up and will repeat thyroid function then.

## 2016-05-10 NOTE — Progress Notes (Signed)
Pre visit review using our clinic review tool, if applicable. No additional management support is needed unless otherwise documented below in the visit note. 

## 2016-05-10 NOTE — Progress Notes (Signed)
   Subjective:    Patient ID: Steven Ward, male    DOB: 08/17/1925, 82 y.o.   MRN: [id number]  HPI    Review of Systems     Objective:   Physical Exam        Assessment & Plan:

## 2016-05-10 NOTE — Progress Notes (Signed)
Subjective:     Patient ID: Steven Ward, male   DOB: 03/30/1925, 82 y.o.   MRN: [id number]  HPI Patient seen for follow-up regarding major depression episode. Refer to recent visit. He scored 24 on the pH Q-9 questionnaire. We started sertraline 25 mg daily. He tried increasing to 50 mg but states that he became "dizzy ". He has seen improvement though even at the 25 mg dose. His girlfriend is also noted significant improvement. He is sleeping well. Appetite is improving. He is getting out more.  Increased fatigue recently. We obtained several labs significant for slightly elevated TSH. B12 normal. Chemistry stable. CBC normal.  He continues to have issues regarding severe back pain from lumbar stenosis. He is discouraged in that his walking is so limited. He's tried cycling but had increased pain and cannot tolerate.  Type 2 diabetes. Recent A1c improved 6.8%. No hypoglycemia.  Past Medical History:  Diagnosis Date  . ABNORMAL THYROID FUNCTION TESTS 01/17/2010  . ALLERGIC RHINITIS 12/01/2008  . Colon cancer ([place])   . COLONIC POLYPS, HX OF 12/01/2008  . DIABETES MELLITUS, TYPE II 12/01/2008  . EDEMA LEG 05/04/2009  . ESSENTIAL HYPERTENSION 12/01/2008  . Exocrine pancreatic insufficiency ([place])   . GALLSTONES 04/07/2009  . GERD 12/01/2008  . Hay fever   . SPONDYLOSIS, LUMBAR 12/01/2008   Past Surgical History:  Procedure Laterality Date  . COLON SURGERY     resection 1990 for cancer    reports that he quit smoking about 47 years ago. His smoking use included Cigarettes. He has a 20.00 pack-year smoking history. He does not have any smokeless tobacco history on file. He reports that he does not drink alcohol. His drug history is not on file. family history includes Cancer in his other; Diabetes in his other; Stroke in his other. Allergies  Allergen Reactions  . Amoxicillin     REACTION: rash, dizziness     Review of Systems  Constitutional: Negative for fatigue.  Eyes: Negative for  visual disturbance.  Respiratory: Negative for cough, chest tightness and shortness of breath.   Cardiovascular: Negative for chest pain, palpitations and leg swelling.  Endocrine: Negative for polydipsia and polyuria.  Genitourinary: Negative for dysuria.  Musculoskeletal: Positive for back pain.  Neurological: Negative for dizziness, syncope, weakness, light-headedness and headaches.  Psychiatric/Behavioral: Negative for agitation, confusion, sleep disturbance and suicidal ideas. The patient is not nervous/anxious.        Objective:   Physical Exam  Constitutional: He is oriented to person, place, and time. He appears well-developed and well-nourished.  Cardiovascular: Normal rate and regular rhythm.   Pulmonary/Chest: Effort normal and breath sounds normal. No respiratory distress. He has no wheezes. He has no rales.  Musculoskeletal: He exhibits no edema.  Neurological: He is alert and oriented to person, place, and time.  Psychiatric:  Mood is much improved today. He is much more interactive. PH Q-9 score today of 9 versus recent score of 24 prior to starting sertraline       Assessment:     #1 major depression-initial episode improving  #2 type 2 diabetes well controlled  #3 chronic low back pain secondary to lumbar stenosis  #4 mildly elevated TSH    Plan:     -Continue sertraline and try increasing this to 50 mg daily at bedtime -Repeat TSH at follow-up in 3 months -Suggest he consider alternative exercise such as pool exercises since he is having significant back issues with walking or cycling -Continue current blood  pressure and diabetes medications.  Repeat A1c in 3 months  [name] MD [facility] at [facility]

## 2016-05-11 ENCOUNTER — Ambulatory Visit (INDEPENDENT_AMBULATORY_CARE_PROVIDER_SITE_OTHER): Payer: PPO | Admitting: Podiatry

## 2016-05-11 ENCOUNTER — Encounter: Payer: Self-pay | Admitting: Podiatry

## 2016-05-11 DIAGNOSIS — B351 Tinea unguium: Secondary | ICD-10-CM | POA: Diagnosis not present

## 2016-05-11 DIAGNOSIS — M79676 Pain in unspecified toe(s): Secondary | ICD-10-CM | POA: Diagnosis not present

## 2016-05-11 NOTE — Progress Notes (Signed)
Patient ID: Steven Ward, male   DOB: 08/08/1925, 82 y.o.   MRN: [id number] Complaint:  Visit Type: Patient returns to my office for continued preventative foot care services. Complaint: Patient states" my nails have grown long and thick and become painful to walk and wear shoes" Patient has been diagnosed with DM with no  foot complications. He presents for preventative foot care services. No changes to ROS  Podiatric Exam: Vascular: dorsalis pedis and posterior tibial pulses are palpable bilateral. Capillary return is immediate. Temperature gradient is WNL. Skin turgor WNL  Sensorium: Normal Semmes Weinstein monofilament test. Normal tactile sensation bilaterally. Nail Exam: Pt has thick disfigured discolored nails with subungual debris noted bilateral entire nail hallux through fifth toenails Ulcer Exam: There is no evidence of ulcer or pre-ulcerative changes or infection. Orthopedic Exam: Muscle tone and strength are WNL. No limitations in general ROM. No crepitus or effusions noted. Foot type and digits show no abnormalities. Bony prominences are unremarkable. Skin: No Porokeratosis. No infection or ulcers  Diagnosis:  Tinea unguium, Pain in right toe, pain in left toes  Treatment & Plan Procedures and Treatment: Consent by patient was obtained for treatment procedures. The patient understood the discussion of treatment and procedures well. All questions were answered thoroughly reviewed. Debridement of mycotic and hypertrophic toenails, 1 through 5 bilateral and clearing of subungual debris. No ulceration, no infection noted.  Return Visit-Office Procedure: Patient instructed to return to the office for a follow up visit 10 weeks  for continued evaluation and treatment.     Madia Carvell DPM 

## 2016-05-16 DIAGNOSIS — H01022 Squamous blepharitis right lower eyelid: Secondary | ICD-10-CM | POA: Diagnosis not present

## 2016-05-16 DIAGNOSIS — Z961 Presence of intraocular lens: Secondary | ICD-10-CM | POA: Diagnosis not present

## 2016-05-16 DIAGNOSIS — H01021 Squamous blepharitis right upper eyelid: Secondary | ICD-10-CM | POA: Diagnosis not present

## 2016-05-16 DIAGNOSIS — H01025 Squamous blepharitis left lower eyelid: Secondary | ICD-10-CM | POA: Diagnosis not present

## 2016-05-16 DIAGNOSIS — H401132 Primary open-angle glaucoma, bilateral, moderate stage: Secondary | ICD-10-CM | POA: Diagnosis not present

## 2016-05-16 DIAGNOSIS — H01024 Squamous blepharitis left upper eyelid: Secondary | ICD-10-CM | POA: Diagnosis not present

## 2016-05-26 ENCOUNTER — Ambulatory Visit (INDEPENDENT_AMBULATORY_CARE_PROVIDER_SITE_OTHER): Payer: PPO | Admitting: Family Medicine

## 2016-05-26 ENCOUNTER — Encounter: Payer: Self-pay | Admitting: Family Medicine

## 2016-05-26 VITALS — BP 100/60 | HR 87 | Temp 97.6°F | Ht 68.0 in | Wt 190.3 lb

## 2016-05-26 DIAGNOSIS — M47817 Spondylosis without myelopathy or radiculopathy, lumbosacral region: Secondary | ICD-10-CM | POA: Diagnosis not present

## 2016-05-26 DIAGNOSIS — F329 Major depressive disorder, single episode, unspecified: Secondary | ICD-10-CM | POA: Diagnosis not present

## 2016-05-26 NOTE — Patient Instructions (Signed)
Remember Flu vaccine by this Fall.

## 2016-05-26 NOTE — Progress Notes (Signed)
Subjective:     Patient ID: Steven Ward, male   DOB: [birth year], 82 y.o.   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 He received a letter clarifying if he is capable driving a bus be has no intentions of going back at this time.  Recent major depression which is improved significantly on sertraline. His appetite is improving. Motivation is somewhat improved. Very limited in physical activities because of advanced lumbar stenosis. We've encouraged him to check with local [employer] regarding pool exercises.  Past Medical History:  Diagnosis Date  . ABNORMAL THYROID FUNCTION TESTS 01/17/2010  . ALLERGIC RHINITIS 12/01/2008  . Colon cancer ([place])   . COLONIC POLYPS, HX OF 12/01/2008  . DIABETES MELLITUS, TYPE II 12/01/2008  . EDEMA LEG 05/04/2009  . ESSENTIAL HYPERTENSION 12/01/2008  . Exocrine pancreatic insufficiency ([place])   . GALLSTONES 04/07/2009  . GERD 12/01/2008  . Hay fever   . SPONDYLOSIS, LUMBAR 12/01/2008   Past Surgical History:  Procedure Laterality Date  . COLON SURGERY     resection 1990 for cancer    reports that he quit smoking about 47 years ago. His smoking use included Cigarettes. He has a 20.00 pack-year smoking history. He does not have any smokeless tobacco history on file. He reports that he does not drink alcohol. His drug history is not on file. family history includes Cancer in his other; Diabetes in his other; Stroke in his other. Allergies  Allergen Reactions  . Amoxicillin     REACTION: rash, dizziness     Review of Systems  Eyes: Negative for visual disturbance.  Respiratory: Negative for cough, chest tightness and shortness of breath.   Cardiovascular: Negative for chest pain, palpitations and leg swelling.  Gastrointestinal: Negative for abdominal pain.  Endocrine: Negative for polydipsia and polyuria.  Musculoskeletal: Positive for back pain.   Neurological: Negative for dizziness, syncope, weakness, light-headedness and headaches.       Objective:   Physical Exam  Constitutional: He appears well-developed and well-nourished. No distress.  Cardiovascular: Normal rate and regular rhythm.   Pulmonary/Chest: Effort normal and breath sounds normal. No respiratory distress. He has no wheezes.  Neurological: He is alert.  Psychiatric: He has a normal mood and affect. His behavior is normal.       Assessment:     #1 major depressive episode improving on sertraline. Plan at least minimum of 4-9 months of treatment with medication  #2 advanced lumbar stenosis.    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

## 2016-06-26 ENCOUNTER — Other Ambulatory Visit: Payer: Self-pay | Admitting: Family Medicine

## 2016-07-20 ENCOUNTER — Encounter: Payer: Self-pay | Admitting: Podiatry

## 2016-07-20 ENCOUNTER — Ambulatory Visit (INDEPENDENT_AMBULATORY_CARE_PROVIDER_SITE_OTHER): Payer: PPO | Admitting: Podiatry

## 2016-07-20 VITALS — Ht 69.0 in | Wt 190.0 lb

## 2016-07-20 DIAGNOSIS — M79676 Pain in unspecified toe(s): Secondary | ICD-10-CM

## 2016-07-20 DIAGNOSIS — B351 Tinea unguium: Secondary | ICD-10-CM | POA: Diagnosis not present

## 2016-07-20 NOTE — Progress Notes (Signed)
Patient ID: Steven Ward, male   DOB: 05/07/1925, 82 y.o.   MRN: [id number] Complaint:  Visit Type: Patient returns to my office for continued preventative foot care services. Complaint: Patient states" my nails have grown long and thick and become painful to walk and wear shoes" Patient has been diagnosed with DM with no  foot complications. He presents for preventative foot care services. No changes to ROS  Podiatric Exam: Vascular: dorsalis pedis and posterior tibial pulses are palpable bilateral. Capillary return is immediate. Temperature gradient is WNL. Skin turgor WNL  Sensorium: Normal Semmes Weinstein monofilament test. Normal tactile sensation bilaterally. Nail Exam: Pt has thick disfigured discolored nails with subungual debris noted bilateral entire nail hallux through fifth toenails Ulcer Exam: There is no evidence of ulcer or pre-ulcerative changes or infection. Orthopedic Exam: Muscle tone and strength are WNL. No limitations in general ROM. No crepitus or effusions noted. Foot type and digits show no abnormalities. Bony prominences are unremarkable. Skin: No Porokeratosis. No infection or ulcers  Diagnosis:  Tinea unguium, Pain in right toe, pain in left toes  Treatment & Plan Procedures and Treatment: Consent by patient was obtained for treatment procedures. The patient understood the discussion of treatment and procedures well. All questions were answered thoroughly reviewed. Debridement of mycotic and hypertrophic toenails, 1 through 5 bilateral and clearing of subungual debris. No ulceration, no infection noted.  Return Visit-Office Procedure: Patient instructed to return to the office for a follow up visit 10 weeks  for continued evaluation and treatment.     Rayanna Matusik DPM 

## 2016-08-07 ENCOUNTER — Ambulatory Visit (INDEPENDENT_AMBULATORY_CARE_PROVIDER_SITE_OTHER): Payer: PPO | Admitting: Family Medicine

## 2016-08-07 ENCOUNTER — Encounter: Payer: Self-pay | Admitting: Family Medicine

## 2016-08-07 VITALS — BP 110/70 | HR 81 | Temp 97.7°F | Ht 69.0 in | Wt 190.0 lb

## 2016-08-07 DIAGNOSIS — M48061 Spinal stenosis, lumbar region without neurogenic claudication: Secondary | ICD-10-CM

## 2016-08-07 DIAGNOSIS — N183 Chronic kidney disease, stage 3 (moderate): Secondary | ICD-10-CM

## 2016-08-07 DIAGNOSIS — I10 Essential (primary) hypertension: Secondary | ICD-10-CM | POA: Diagnosis not present

## 2016-08-07 DIAGNOSIS — E1121 Type 2 diabetes mellitus with diabetic nephropathy: Secondary | ICD-10-CM | POA: Diagnosis not present

## 2016-08-07 DIAGNOSIS — E1122 Type 2 diabetes mellitus with diabetic chronic kidney disease: Secondary | ICD-10-CM | POA: Diagnosis not present

## 2016-08-07 DIAGNOSIS — F329 Major depressive disorder, single episode, unspecified: Secondary | ICD-10-CM

## 2016-08-07 LAB — POCT GLYCOSYLATED HEMOGLOBIN (HGB A1C): HEMOGLOBIN A1C: 6.3

## 2016-08-07 NOTE — Progress Notes (Signed)
Subjective:     Patient ID: Steven Ward, male   DOB: 1924/11/20, 82 y.o.   MRN: [id number]  HPI Patient seen for medical follow-up  Type 2 diabetes. History of good control. Remains on metformin and glyburide. Recent fasting blood sugars usually ranging around 130-140. No polyuria. No polydipsia. He is seeing a podiatrist regularly for toenail care  Chronic kidney disease stage III. Has been stable for many years. He avoids nonsteroidals. Lumbar stenosis with ongoing back pains. Relatively stable. No new neuro symptoms. Denies lower extremity weakness. No urine or stool incontinence. Takes tramadol for pain relief and this is working fairly well  Blood pressure which has been stable on amlodipine, benazepril, and HCTZ. No dizziness. No chest pains. Appetite and weight are stable  Past Medical History:  Diagnosis Date  . ABNORMAL THYROID FUNCTION TESTS 01/17/2010  . ALLERGIC RHINITIS 12/01/2008  . Colon cancer ([place])   . COLONIC POLYPS, HX OF 12/01/2008  . DIABETES MELLITUS, TYPE II 12/01/2008  . EDEMA LEG 05/04/2009  . ESSENTIAL HYPERTENSION 12/01/2008  . Exocrine pancreatic insufficiency   . GALLSTONES 04/07/2009  . GERD 12/01/2008  . Hay fever   . SPONDYLOSIS, LUMBAR 12/01/2008   Past Surgical History:  Procedure Laterality Date  . COLON SURGERY     resection 1990 for cancer    reports that he quit smoking about 47 years ago. His smoking use included Cigarettes. He has a 20.00 pack-year smoking history. He does not have any smokeless tobacco history on file. He reports that he does not drink alcohol. His drug history is not on file. family history includes Cancer in his other; Diabetes in his other; Stroke in his other. Allergies  Allergen Reactions  . Amoxicillin     REACTION: rash, dizziness     Review of Systems  Constitutional: Negative for appetite change, fatigue and unexpected weight change.  Eyes: Negative for visual disturbance.  Respiratory: Negative for cough,  chest tightness and shortness of breath.   Cardiovascular: Negative for chest pain, palpitations and leg swelling.  Endocrine: Negative for polydipsia and polyuria.  Musculoskeletal: Positive for back pain.  Neurological: Negative for dizziness, syncope, weakness, light-headedness and headaches.       Objective:   Physical Exam  Constitutional: He is oriented to person, place, and time. He appears well-developed and well-nourished.  HENT:  Right Ear: External ear normal.  Left Ear: External ear normal.  Mouth/Throat: Oropharynx is clear and moist.  Eyes: Pupils are equal, round, and reactive to light.  Neck: Neck supple. No thyromegaly present.  Cardiovascular: Normal rate and regular rhythm.   Pulmonary/Chest: Effort normal and breath sounds normal. No respiratory distress. He has no wheezes. He has no rales.  Musculoskeletal: He exhibits no edema.  Neurological: He is alert and oriented to person, place, and time.       Assessment:     #1 type 2 diabetes. History of good control  #2 hypertension stable and at goal  #3 lumbar stenosis  #4 major depressive episode doing well on sertraline  #5 chronic kidney disease stage III    Plan:     -Recheck hemoglobin A1c -Continue regular podiatry follow-up -Continue sertraline through the winter. May consider discontinuing neck spring if doing well  [name] MD [facility] at [facility]

## 2016-08-07 NOTE — Progress Notes (Signed)
Pre visit review using our clinic review tool, if applicable. No additional management support is needed unless otherwise documented below in the visit note. 

## 2016-08-23 ENCOUNTER — Other Ambulatory Visit: Payer: Self-pay | Admitting: Family Medicine

## 2016-09-15 DIAGNOSIS — H01024 Squamous blepharitis left upper eyelid: Secondary | ICD-10-CM | POA: Diagnosis not present

## 2016-09-15 DIAGNOSIS — H01022 Squamous blepharitis right lower eyelid: Secondary | ICD-10-CM | POA: Diagnosis not present

## 2016-09-15 DIAGNOSIS — H401132 Primary open-angle glaucoma, bilateral, moderate stage: Secondary | ICD-10-CM | POA: Diagnosis not present

## 2016-09-15 DIAGNOSIS — H01021 Squamous blepharitis right upper eyelid: Secondary | ICD-10-CM | POA: Diagnosis not present

## 2016-09-15 DIAGNOSIS — Z961 Presence of intraocular lens: Secondary | ICD-10-CM | POA: Diagnosis not present

## 2016-09-15 DIAGNOSIS — H01025 Squamous blepharitis left lower eyelid: Secondary | ICD-10-CM | POA: Diagnosis not present

## 2016-09-28 ENCOUNTER — Encounter: Payer: Self-pay | Admitting: Podiatry

## 2016-09-28 ENCOUNTER — Ambulatory Visit (INDEPENDENT_AMBULATORY_CARE_PROVIDER_SITE_OTHER): Payer: PPO | Admitting: Podiatry

## 2016-09-28 VITALS — Ht 69.0 in | Wt 190.0 lb

## 2016-09-28 DIAGNOSIS — M79676 Pain in unspecified toe(s): Secondary | ICD-10-CM | POA: Diagnosis not present

## 2016-09-28 DIAGNOSIS — B351 Tinea unguium: Secondary | ICD-10-CM

## 2016-09-28 NOTE — Progress Notes (Signed)
Patient ID: Steven Ward, male   DOB: 04/27/1925, 83 y.o.   MRN: [id number] Complaint:  Visit Type: Patient returns to my office for continued preventative foot care services. Complaint: Patient states" my nails have grown long and thick and become painful to walk and wear shoes" Patient has been diagnosed with DM with no  foot complications. He presents for preventative foot care services. No changes to ROS  Podiatric Exam: Vascular: dorsalis pedis and posterior tibial pulses are palpable bilateral. Capillary return is immediate. Temperature gradient is WNL. Skin turgor WNL  Sensorium: Normal Semmes Weinstein monofilament test. Normal tactile sensation bilaterally. Nail Exam: Pt has thick disfigured discolored nails with subungual debris noted bilateral entire nail hallux through fifth toenails Ulcer Exam: There is no evidence of ulcer or pre-ulcerative changes or infection. Orthopedic Exam: Muscle tone and strength are WNL. No limitations in general ROM. No crepitus or effusions noted. Foot type and digits show no abnormalities. Bony prominences are unremarkable. Skin: No Porokeratosis. No infection or ulcers  Diagnosis:  Tinea unguium, Pain in right toe, pain in left toes  Treatment & Plan Procedures and Treatment: Consent by patient was obtained for treatment procedures. The patient understood the discussion of treatment and procedures well. All questions were answered thoroughly reviewed. Debridement of mycotic and hypertrophic toenails, 1 through 5 bilateral and clearing of subungual debris. No ulceration, no infection noted.  Return Visit-Office Procedure: Patient instructed to return to the office for a follow up visit 10 weeks  for continued evaluation and treatment.     Janicia Monterrosa DPM 

## 2016-10-17 ENCOUNTER — Other Ambulatory Visit: Payer: Self-pay | Admitting: Family Medicine

## 2016-12-05 ENCOUNTER — Encounter: Payer: Self-pay | Admitting: Family Medicine

## 2016-12-05 ENCOUNTER — Ambulatory Visit (INDEPENDENT_AMBULATORY_CARE_PROVIDER_SITE_OTHER): Payer: PPO | Admitting: Family Medicine

## 2016-12-05 VITALS — BP 120/60 | HR 65 | Temp 98.0°F | Wt 196.7 lb

## 2016-12-05 DIAGNOSIS — E1121 Type 2 diabetes mellitus with diabetic nephropathy: Secondary | ICD-10-CM

## 2016-12-05 DIAGNOSIS — M47817 Spondylosis without myelopathy or radiculopathy, lumbosacral region: Secondary | ICD-10-CM

## 2016-12-05 DIAGNOSIS — I10 Essential (primary) hypertension: Secondary | ICD-10-CM | POA: Diagnosis not present

## 2016-12-05 DIAGNOSIS — F329 Major depressive disorder, single episode, unspecified: Secondary | ICD-10-CM | POA: Diagnosis not present

## 2016-12-05 LAB — POCT GLYCOSYLATED HEMOGLOBIN (HGB A1C): Hemoglobin A1C: 6

## 2016-12-05 NOTE — Progress Notes (Signed)
Pre visit review using our clinic review tool, if applicable. No additional management support is needed unless otherwise documented below in the visit note. 

## 2016-12-05 NOTE — Progress Notes (Signed)
Subjective:     Patient ID: Steven Ward, male   DOB: January 05, 1925, 83 y.o.   MRN: [id number]  HPI Patient seen for medical follow-up  Type 2 diabetes. Scheduled podiatry follow-up this week. Blood sugar stable. No hypoglycemia. He has gained about 6-7 pounds since last visit. Doesn't get much activity which he relates to arthritis in lumbar stenosis. Lumbar stenosis pain has been relatively stable.  Patient has hypertension which has been stable on HCTZ, amlodipine, and benazepril. No headaches. No chest pains. Has some tremor right upper extremity which is bothersome at times. No recent falls. No dysuria.  No associated upper extremity weakness area  Patient had depression issues several months ago we started sertraline. He is taking 50 mg once daily and feels his mood is definitely improved.  Past Medical History:  Diagnosis Date  . ABNORMAL THYROID FUNCTION TESTS 01/17/2010  . ALLERGIC RHINITIS 12/01/2008  . Colon cancer ([place])   . COLONIC POLYPS, HX OF 12/01/2008  . DIABETES MELLITUS, TYPE II 12/01/2008  . EDEMA LEG 05/04/2009  . ESSENTIAL HYPERTENSION 12/01/2008  . Exocrine pancreatic insufficiency   . GALLSTONES 04/07/2009  . GERD 12/01/2008  . Hay fever   . SPONDYLOSIS, LUMBAR 12/01/2008   Past Surgical History:  Procedure Laterality Date  . COLON SURGERY     resection 1990 for cancer    reports that he quit smoking about 47 years ago. His smoking use included Cigarettes. He has a 20.00 pack-year smoking history. He has never used smokeless tobacco. He reports that he does not drink alcohol. His drug history is not on file. family history includes Cancer in his other; Diabetes in his other; Stroke in his other. Allergies  Allergen Reactions  . Amoxicillin     REACTION: rash, dizziness     Review of Systems  Constitutional: Negative for fatigue and unexpected weight change.  Eyes: Negative for visual disturbance.  Respiratory: Negative for cough, chest tightness and shortness  of breath.   Cardiovascular: Negative for chest pain, palpitations and leg swelling.  Endocrine: Negative for polydipsia and polyuria.  Genitourinary: Negative for dysuria.  Neurological: Negative for dizziness, syncope, weakness, light-headedness and headaches.       Objective:   Physical Exam  Constitutional: He is oriented to person, place, and time. He appears well-developed and well-nourished.  HENT:  Right Ear: External ear normal.  Left Ear: External ear normal.  Mouth/Throat: Oropharynx is clear and moist.  Eyes: Pupils are equal, round, and reactive to light.  Neck: Neck supple. No thyromegaly present.  Cardiovascular: Normal rate and regular rhythm.   Pulmonary/Chest: Effort normal and breath sounds normal. No respiratory distress. He has no wheezes. He has no rales.  Musculoskeletal: He exhibits no edema.  Neurological: He is alert and oriented to person, place, and time.       Assessment:     #1 type 2 diabetes. Excellent control with A1c today of 6.0%  #2 hypertension stable and at goal  #3 lumbar stenosis with pain stable and patient takes infrequent tramadol  #4 depression improved on sertraline    Plan:     -A1c as above -Increased activity such as walking as tolerated -Routine follow-up in 4 months and will check further labs at that point including lipid, hepatic, basic metabolic panel, and T7S -Continue regular podiatry follow-up  Steven Post MD [facility] at [facility]

## 2016-12-07 ENCOUNTER — Encounter: Payer: Self-pay | Admitting: Podiatry

## 2016-12-07 ENCOUNTER — Ambulatory Visit (INDEPENDENT_AMBULATORY_CARE_PROVIDER_SITE_OTHER): Payer: PPO | Admitting: Podiatry

## 2016-12-07 DIAGNOSIS — B351 Tinea unguium: Secondary | ICD-10-CM | POA: Diagnosis not present

## 2016-12-07 DIAGNOSIS — M79676 Pain in unspecified toe(s): Secondary | ICD-10-CM | POA: Diagnosis not present

## 2016-12-07 NOTE — Progress Notes (Signed)
Patient ID: Steven Ward, male   DOB: Jul 30, 1925, 83 y.o.   MRN: [id number] Complaint:  Visit Type: Patient returns to my office for continued preventative foot care services. Complaint: Patient states" my nails have grown long and thick and become painful to walk and wear shoes" Patient has been diagnosed with DM with no  foot complications. He presents for preventative foot care services. No changes to ROS  Podiatric Exam: Vascular: dorsalis pedis and posterior tibial pulses are palpable bilateral. Capillary return is immediate. Temperature gradient is WNL. Skin turgor WNL  Sensorium: Normal Semmes Weinstein monofilament test. Normal tactile sensation bilaterally. Nail Exam: Pt has thick disfigured discolored nails with subungual debris noted bilateral entire nail hallux through fifth toenails Ulcer Exam: There is no evidence of ulcer or pre-ulcerative changes or infection. Orthopedic Exam: Muscle tone and strength are WNL. No limitations in general ROM. No crepitus or effusions noted. Foot type and digits show no abnormalities. Bony prominences are unremarkable. Skin: No Porokeratosis. No infection or ulcers  Diagnosis:  Tinea unguium, Pain in right toe, pain in left toes  Treatment & Plan Procedures and Treatment: Consent by patient was obtained for treatment procedures. The patient understood the discussion of treatment and procedures well. All questions were answered thoroughly reviewed. Debridement of mycotic and hypertrophic toenails, 1 through 5 bilateral and clearing of subungual debris. No ulceration, no infection noted.  Return Visit-Office Procedure: Patient instructed to return to the office for a follow up visit 10 weeks  for continued evaluation and treatment.     Gardiner Barefoot DPM

## 2017-01-15 DIAGNOSIS — H401132 Primary open-angle glaucoma, bilateral, moderate stage: Secondary | ICD-10-CM | POA: Diagnosis not present

## 2017-01-15 DIAGNOSIS — H01024 Squamous blepharitis left upper eyelid: Secondary | ICD-10-CM | POA: Diagnosis not present

## 2017-01-15 DIAGNOSIS — Z961 Presence of intraocular lens: Secondary | ICD-10-CM | POA: Diagnosis not present

## 2017-01-15 DIAGNOSIS — H01021 Squamous blepharitis right upper eyelid: Secondary | ICD-10-CM | POA: Diagnosis not present

## 2017-01-15 DIAGNOSIS — H01025 Squamous blepharitis left lower eyelid: Secondary | ICD-10-CM | POA: Diagnosis not present

## 2017-01-15 DIAGNOSIS — H01022 Squamous blepharitis right lower eyelid: Secondary | ICD-10-CM | POA: Diagnosis not present

## 2017-01-19 ENCOUNTER — Other Ambulatory Visit: Payer: Self-pay | Admitting: Family Medicine

## 2017-01-24 ENCOUNTER — Other Ambulatory Visit: Payer: Self-pay | Admitting: *Deleted

## 2017-01-24 MED ORDER — HYDROCHLOROTHIAZIDE 25 MG PO TABS: ORAL_TABLET | ORAL | 3 refills | Status: DC

## 2017-02-16 ENCOUNTER — Encounter: Payer: Self-pay | Admitting: Podiatry

## 2017-02-16 ENCOUNTER — Ambulatory Visit (INDEPENDENT_AMBULATORY_CARE_PROVIDER_SITE_OTHER): Payer: PPO | Admitting: Podiatry

## 2017-02-16 DIAGNOSIS — M79676 Pain in unspecified toe(s): Secondary | ICD-10-CM | POA: Diagnosis not present

## 2017-02-16 DIAGNOSIS — B351 Tinea unguium: Secondary | ICD-10-CM

## 2017-02-16 NOTE — Progress Notes (Signed)
Patient ID: Steven Ward, male   DOB: 05/29/1925, 83 y.o.   MRN: [id number] Complaint:  Visit Type: Patient returns to my office for continued preventative foot care services. Complaint: Patient states" my nails have grown long and thick and become painful to walk and wear shoes" Patient has been diagnosed with DM with no  foot complications. He presents for preventative foot care services. No changes to ROS  Podiatric Exam: Vascular: dorsalis pedis and posterior tibial pulses are palpable bilateral. Capillary return is immediate. Temperature gradient is WNL. Skin turgor WNL  Sensorium: Normal Semmes Weinstein monofilament test. Normal tactile sensation bilaterally. Nail Exam: Pt has thick disfigured discolored nails with subungual debris noted bilateral entire nail hallux through fifth toenails Ulcer Exam: There is no evidence of ulcer or pre-ulcerative changes or infection. Orthopedic Exam: Muscle tone and strength are WNL. No limitations in general ROM. No crepitus or effusions noted. Foot type and digits show no abnormalities. Bony prominences are unremarkable. Skin: No Porokeratosis. No infection or ulcers  Diagnosis:  Tinea unguium, Pain in right toe, pain in left toes  Treatment & Plan Procedures and Treatment: Consent by patient was obtained for treatment procedures. The patient understood the discussion of treatment and procedures well. All questions were answered thoroughly reviewed. Debridement of mycotic and hypertrophic toenails, 1 through 5 bilateral and clearing of subungual debris. No ulceration, no infection noted.  Return Visit-Office Procedure: Patient instructed to return to the office for a follow up visit 10 weeks  for continued evaluation and treatment.     Tedford Berg DPM 

## 2017-02-20 ENCOUNTER — Other Ambulatory Visit: Payer: Self-pay | Admitting: Family Medicine

## 2017-04-06 ENCOUNTER — Ambulatory Visit (INDEPENDENT_AMBULATORY_CARE_PROVIDER_SITE_OTHER): Payer: PPO | Admitting: Family Medicine

## 2017-04-06 ENCOUNTER — Encounter: Payer: Self-pay | Admitting: Family Medicine

## 2017-04-06 VITALS — BP 110/80 | HR 83 | Temp 98.0°F | Wt 192.3 lb

## 2017-04-06 DIAGNOSIS — E1122 Type 2 diabetes mellitus with diabetic chronic kidney disease: Secondary | ICD-10-CM | POA: Diagnosis not present

## 2017-04-06 DIAGNOSIS — E785 Hyperlipidemia, unspecified: Secondary | ICD-10-CM

## 2017-04-06 DIAGNOSIS — E1121 Type 2 diabetes mellitus with diabetic nephropathy: Secondary | ICD-10-CM

## 2017-04-06 DIAGNOSIS — I10 Essential (primary) hypertension: Secondary | ICD-10-CM | POA: Diagnosis not present

## 2017-04-06 DIAGNOSIS — N400 Enlarged prostate without lower urinary tract symptoms: Secondary | ICD-10-CM | POA: Diagnosis not present

## 2017-04-06 DIAGNOSIS — N183 Chronic kidney disease, stage 3 (moderate): Secondary | ICD-10-CM

## 2017-04-06 LAB — BASIC METABOLIC PANEL
BUN: 28 mg/dL — ABNORMAL HIGH (ref 6–23)
CALCIUM: 9.3 mg/dL (ref 8.4–10.5)
CO2: 29 mEq/L (ref 19–32)
CREATININE: 1.42 mg/dL (ref 0.40–1.50)
Chloride: 105 mEq/L (ref 96–112)
GFR: 49.76 mL/min — AB (ref >60.00)
Glucose, Bld: 100 mg/dL — ABNORMAL HIGH (ref 70–99)
Potassium: 4.5 mEq/L (ref 3.5–5.1)
SODIUM: 140 meq/L (ref 135–145)

## 2017-04-06 LAB — HEPATIC FUNCTION PANEL
ALK PHOS: 68 U/L (ref 39–117)
ALT: 16 U/L (ref 0–53)
AST: 14 U/L (ref 0–37)
Albumin: 3.8 g/dL (ref 3.5–5.2)
BILIRUBIN DIRECT: 0.2 mg/dL (ref 0.0–0.3)
TOTAL PROTEIN: 6.5 g/dL (ref 6.0–8.3)
Total Bilirubin: 0.6 mg/dL (ref 0.2–1.2)

## 2017-04-06 LAB — LIPID PANEL
CHOLESTEROL: 154 mg/dL (ref 0–200)
HDL: 43.7 mg/dL (ref >39.00)
LDL Cholesterol: 96 mg/dL (ref 0–99)
NonHDL: 110.29
TRIGLYCERIDES: 69 mg/dL (ref 0.0–149.0)
Total CHOL/HDL Ratio: 4
VLDL: 13.8 mg/dL (ref 0.0–40.0)

## 2017-04-06 LAB — POCT GLYCOSYLATED HEMOGLOBIN (HGB A1C): HEMOGLOBIN A1C: 5.9

## 2017-04-06 NOTE — Progress Notes (Signed)
Subjective:     Patient ID: Steven Ward, male   DOB: [birth year], 83 y.o.   MRN: [id number]  HPI Patient here for medical follow-up. His chronic problems include history of type 2 diabetes, lumbar stenosis, depression, exocrine pancreatic insufficiency, chronic kidney disease, BPH, hypertension. Medications reviewed. Compliant with all. Blood sugars been very well controlled. A1c today 5.9%. No recent hypoglycemia.  His weight is down 4 pounds from last visit he states this is intentional. Urine flow is good. He is getting regular follow-up through podiatry for nail trimming and saw them recently.  Past Medical History:  Diagnosis Date  . ABNORMAL THYROID FUNCTION TESTS 01/17/2010  . ALLERGIC RHINITIS 12/01/2008  . Colon cancer ([place])   . COLONIC POLYPS, HX OF 12/01/2008  . DIABETES MELLITUS, TYPE II 12/01/2008  . EDEMA LEG 05/04/2009  . ESSENTIAL HYPERTENSION 12/01/2008  . Exocrine pancreatic insufficiency   . GALLSTONES 04/07/2009  . GERD 12/01/2008  . Hay fever   . SPONDYLOSIS, LUMBAR 12/01/2008   Past Surgical History:  Procedure Laterality Date  . COLON SURGERY     resection 1990 for cancer    reports that he quit smoking about 48 years ago. His smoking use included Cigarettes. He has a 20.00 pack-year smoking history. He has never used smokeless tobacco. He reports that he does not drink alcohol. His drug history is not on file. family history includes Cancer in his other; Diabetes in his other; Stroke in his other. Allergies  Allergen Reactions  . Amoxicillin     REACTION: rash, dizziness     Review of Systems  Constitutional: Negative for fatigue.  Eyes: Negative for visual disturbance.  Respiratory: Negative for cough, chest tightness and shortness of breath.   Cardiovascular: Negative for chest pain, palpitations and leg swelling.  Endocrine: Negative for polydipsia and polyuria.  Musculoskeletal: Positive for arthralgias and back pain.  Neurological: Negative for  dizziness, syncope, weakness, light-headedness and headaches.       Objective:   Physical Exam  Constitutional: He is oriented to person, place, and time. He appears well-developed and well-nourished.  HENT:  Right Ear: External ear normal.  Left Ear: External ear normal.  Mouth/Throat: Oropharynx is clear and moist.  Eyes: Pupils are equal, round, and reactive to light.  Neck: Neck supple. No thyromegaly present.  Cardiovascular: Normal rate and regular rhythm.   Pulmonary/Chest: Effort normal and breath sounds normal. No respiratory distress. He has no wheezes. He has no rales.  Musculoskeletal: He exhibits no edema.  Neurological: He is alert and oriented to person, place, and time.  Skin:  Physical exam was not repeated today he just saw a podiatrist week ago       Assessment:     #1 type 2 diabetes well-controlled A1c 5.9%  #2 hypertension stable and at goal  #3 chronic kidney disease  #4 dyslipidemia  #5 BPH stable symptomatically    Plan:     -Check further labs today with lipid panel, hepatic panel, basic metabolic panel -Continue regular yearly eye exams -No changes in medication made at this time -Encouraged to set up Medicare annual wellness visit  [name] MD [facility] at [facility]

## 2017-04-08 DIAGNOSIS — E785 Hyperlipidemia, unspecified: Secondary | ICD-10-CM | POA: Insufficient documentation

## 2017-05-02 ENCOUNTER — Ambulatory Visit (INDEPENDENT_AMBULATORY_CARE_PROVIDER_SITE_OTHER): Payer: PPO | Admitting: Podiatry

## 2017-05-02 DIAGNOSIS — E119 Type 2 diabetes mellitus without complications: Secondary | ICD-10-CM | POA: Diagnosis not present

## 2017-05-02 DIAGNOSIS — M79676 Pain in unspecified toe(s): Secondary | ICD-10-CM

## 2017-05-02 DIAGNOSIS — B351 Tinea unguium: Secondary | ICD-10-CM | POA: Diagnosis not present

## 2017-05-02 NOTE — Progress Notes (Signed)
Patient ID: Steven Ward, male   DOB: 12/02/1924, 83 y.o.   MRN: [id number] Complaint:  Visit Type: Patient returns to my office for continued preventative foot care services. Complaint: Patient states" my nails have grown long and thick and become painful to walk and wear shoes" Patient has been diagnosed with DM with no  foot complications. He presents for preventative foot care services. No changes to ROS  Podiatric Exam: Vascular: dorsalis pedis and posterior tibial pulses are palpable bilateral. Capillary return is immediate. Temperature gradient is WNL. Skin turgor WNL  Sensorium: Normal Semmes Weinstein monofilament test. Normal tactile sensation bilaterally. Nail Exam: Pt has thick disfigured discolored nails with subungual debris noted bilateral entire nail hallux through fifth toenails Ulcer Exam: There is no evidence of ulcer or pre-ulcerative changes or infection. Orthopedic Exam: Muscle tone and strength are WNL. No limitations in general ROM. No crepitus or effusions noted. Foot type and digits show no abnormalities. Bony prominences are unremarkable. Skin: No Porokeratosis. No infection or ulcers  Diagnosis:  Tinea unguium, Pain in right toe, pain in left toes  Treatment & Plan Procedures and Treatment: Consent by patient was obtained for treatment procedures. The patient understood the discussion of treatment and procedures well. All questions were answered thoroughly reviewed. Debridement of mycotic and hypertrophic toenails, 1 through 5 bilateral and clearing of subungual debris. No ulceration, no infection noted.  Return Visit-Office Procedure: Patient instructed to return to the office for a follow up visit 10 weeks  for continued evaluation and treatment.     Clydette Privitera DPM 

## 2017-05-10 ENCOUNTER — Ambulatory Visit (INDEPENDENT_AMBULATORY_CARE_PROVIDER_SITE_OTHER): Payer: PPO

## 2017-05-10 VITALS — BP 124/60 | HR 57 | Ht 68.0 in | Wt 195.0 lb

## 2017-05-10 DIAGNOSIS — Z0000 Encounter for general adult medical examination without abnormal findings: Secondary | ICD-10-CM | POA: Diagnosis not present

## 2017-05-10 NOTE — Progress Notes (Signed)
Reviewed in PCP absence. Would advise PCP follow up if any new or worsening pain concerns.  Colin Benton R., DO

## 2017-05-10 NOTE — Patient Instructions (Addendum)
Mr. Steven Ward , Thank you for taking time to come for your Medicare Wellness Visit. I appreciate your ongoing commitment to your health goals. Please review the following plan we discussed and let me know if I can assist you in the future.   The Centers for Disease Control are now recommending 2 pneumonia vaccinations after 23. The first is the Prevnar 13. This helps to boost your immunity to community acquired pneumonia as well as some protection from bacterial pneumonia  The 2nd is the pneumovax 23, which offers more broad protection!  Please consider taking these as this is your best protection against pneumonia.  Generally a PSV 23 is given after 65 but we do not have  A record of this. You came here at 42, so Presumably you had a PSV 23 earlier after turning 67 If you are not sure, you can still take one anytime here or at the pharmacy You had the prevnar, so the PSV 23 is what you would take.   Deaf & Hard of Hearing Division Services - can assist with hearing aid x 1  No reviews  [address] Office  [address]  [phone]  Will  take flu vaccine this season  A little later    Will consider trying your pain medicine to see if it makes you feel better    These are the goals we discussed: Goals    . patient          Will plan a vacation  [place] a trip to the [place]; [place]        This is a list of the screening recommended for you and due dates:  Health Maintenance  Topic Date Due  . Pneumonia vaccines (2 of 2 - PPSV23) 07/28/2015  . Eye exam for diabetics  07/11/2016  . Flu Shot  04/18/2017  . Hemoglobin A1C  10/07/2017  . Complete foot exam   04/06/2018  . Tetanus Vaccine  10/15/2023    Prevention of falls: Remove rugs or any tripping hazards in the home Use Non slip mats in bathtubs and showers Placing grab bars next to the toilet and or shower Placing handrails on both sides of the stair way Adding extra lighting in the  home.   Personal safety issues reviewed:  1. Consider starting a community watch program per [facility] 2.  Changes batteries is smoke detector and/or carbon monoxide detector  3.  If you have firearms; keep them in a safe place 4.  Wear protection when in the sun; Always wear sunscreen or a hat; It is good to have your doctor check your skin annually or review any new areas of concern 5. Driving safety; Keep in the right lane; stay 3 car lengths behind the car in front of you on the highway; look 3 times prior to pulling out; carry your cell phone everywhere you go!    Learn about the Yellow Dot program:  The program allows first responders at your emergency to have access to who your physician is, as well as your medications and medical conditions.  Citizens requesting the Yellow Dot Packages should contact Master Corporal Nunzio Cobbs at the [facility] [phone] for the first week of the program and beginning the week after Easter citizens should contact their Scientist, physiological.    Health Maintenance, Male A healthy lifestyle and preventive care is important for your health and wellness. Ask your health care  provider about what schedule of regular examinations is right for you. What should I know about weight and diet? Eat a Healthy Diet  Eat plenty of vegetables, fruits, whole grains, low-fat dairy products, and lean protein.  Do not eat a lot of foods high in solid fats, added sugars, or salt.  Maintain a Healthy Weight Regular exercise can help you achieve or maintain a healthy weight. You should:  Do at least 150 minutes of exercise each week. The exercise should increase your heart rate and make you sweat (moderate-intensity exercise).  Do strength-training exercises at least twice a week.  Watch Your Levels of Cholesterol and Blood Lipids  Have your blood tested for lipids and cholesterol every 5 years starting at 81 years  of age. If you are at high risk for heart disease, you should start having your blood tested when you are 81 years old. You may need to have your cholesterol levels checked more often if: ? Your lipid or cholesterol levels are high. ? You are older than 81 years of age. ? You are at high risk for heart disease.  What should I know about cancer screening? Many types of cancers can be detected early and may often be prevented. Lung Cancer  You should be screened every year for lung cancer if: ? You are a current smoker who has smoked for at least 30 years. ? You are a former smoker who has quit within the past 15 years.  Talk to your health care provider about your screening options, when you should start screening, and how often you should be screened.  Colorectal Cancer  Routine colorectal cancer screening usually begins at 81 years of age and should be repeated every 5-10 years until you are 81 years old. You may need to be screened more often if early forms of precancerous polyps or small growths are found. Your health care provider may recommend screening at an earlier age if you have risk factors for colon cancer.  Your health care provider may recommend using home test kits to check for hidden blood in the stool.  A small camera at the end of a tube can be used to examine your colon (sigmoidoscopy or colonoscopy). This checks for the earliest forms of colorectal cancer.  Prostate and Testicular Cancer  Depending on your age and overall health, your health care provider may do certain tests to screen for prostate and testicular cancer.  Talk to your health care provider about any symptoms or concerns you have about testicular or prostate cancer.  Skin Cancer  Check your skin from head to toe regularly.  Tell your health care provider about any new moles or changes in moles, especially if: ? There is a change in a mole's size, shape, or color. ? You have a mole that is larger  than a pencil eraser.  Always use sunscreen. Apply sunscreen liberally and repeat throughout the day.  Protect yourself by wearing long sleeves, pants, a wide-brimmed hat, and sunglasses when outside.  What should I know about heart disease, diabetes, and high blood pressure?  If you are 26-34 years of age, have your blood pressure checked every 3-5 years. If you are 53 years of age or older, have your blood pressure checked every year. You should have your blood pressure measured twice-once when you are at a hospital or clinic, and once when you are not at a hospital or clinic. Record the average of the two measurements. To check your blood  pressure when you are not at a hospital or clinic, you can use: ? An automated blood pressure machine at a pharmacy. ? A home blood pressure monitor.  Talk to your health care provider about your target blood pressure.  If you are between 48-8 years old, ask your health care provider if you should take aspirin to prevent heart disease.  Have regular diabetes screenings by checking your fasting blood sugar level. ? If you are at a normal weight and have a low risk for diabetes, have this test once every three years after the age of 10. ? If you are overweight and have a high risk for diabetes, consider being tested at a younger age or more often.  A one-time screening for abdominal aortic aneurysm (AAA) by ultrasound is recommended for men aged 9-75 years who are current or former smokers. What should I know about preventing infection? Hepatitis B If you have a higher risk for hepatitis B, you should be screened for this virus. Talk with your health care provider to find out if you are at risk for hepatitis B infection. Hepatitis C Blood testing is recommended for:  Everyone born from [birth year] through 1965.  Anyone with known risk factors for hepatitis C.  Sexually Transmitted Diseases (STDs)  You should be screened each year for STDs including  gonorrhea and chlamydia if: ? You are sexually active and are younger than 81 years of age. ? You are older than 81 years of age and your health care provider tells you that you are at risk for this type of infection. ? Your sexual activity has changed since you were last screened and you are at an increased risk for chlamydia or gonorrhea. Ask your health care provider if you are at risk.  Talk with your health care provider about whether you are at high risk of being infected with HIV. Your health care provider may recommend a prescription medicine to help prevent HIV infection.  What else can I do?  Schedule regular health, dental, and eye exams.  Stay current with your vaccines (immunizations).  Do not use any tobacco products, such as cigarettes, chewing tobacco, and e-cigarettes. If you need help quitting, ask your health care provider.  Limit alcohol intake to no more than 2 drinks per day. One drink equals 12 ounces of beer, 5 ounces of wine, or 1 ounces of hard liquor.  Do not use street drugs.  Do not share needles.  Ask your health care provider for help if you need support or information about quitting drugs.  Tell your health care provider if you often feel depressed.  Tell your health care provider if you have ever been abused or do not feel safe at home. This information is not intended to replace advice given to you by your health care provider. Make sure you discuss any questions you have with your health care provider. Document Released: 03/02/2008 Document Revised: 05/03/2016 Document Reviewed: 06/08/2015 Elsevier Interactive Patient Education  2018 [place] in the Home Falls can cause injuries and can affect people from all age groups. There are many simple things that you can do to make your home safe and to help prevent falls. What can I do on the outside of my home?  Regularly repair the edges of walkways and driveways and fix any  cracks.  Remove high doorway thresholds.  Trim any shrubbery on the main path into your home.  Use bright outdoor lighting.  Clear walkways  of debris and clutter, including tools and rocks.  Regularly check that handrails are securely fastened and in good repair. Both sides of any steps should have handrails.  Install guardrails along the edges of any raised decks or porches.  Have leaves, snow, and ice cleared regularly.  Use sand or salt on walkways during winter months.  In the garage, clean up any spills right away, including grease or oil spills. What can I do in the bathroom?  Use night lights.  Install grab bars by the toilet and in the tub and shower. Do not use towel bars as grab bars.  Use non-skid mats or decals on the floor of the tub or shower.  If you need to sit down while you are in the shower, use a plastic, non-slip stool.  Keep the floor dry. Immediately clean up any water that spills on the floor.  Remove soap buildup in the tub or shower on a regular basis.  Attach bath mats securely with double-sided non-slip rug tape.  Remove throw rugs and other tripping hazards from the floor. What can I do in the bedroom?  Use night lights.  Make sure that a bedside light is easy to reach.  Do not use oversized bedding that drapes onto the floor.  Have a firm chair that has side arms to use for getting dressed.  Remove throw rugs and other tripping hazards from the floor. What can I do in the kitchen?  Clean up any spills right away.  Avoid walking on wet floors.  Place frequently used items in easy-to-reach places.  If you need to reach for something above you, use a sturdy step stool that has a grab bar.  Keep electrical cables out of the way.  Do not use floor polish or wax that makes floors slippery. If you have to use wax, make sure that it is non-skid floor wax.  Remove throw rugs and other tripping hazards from the floor. What can I do in  the stairways?  Do not leave any items on the stairs.  Make sure that there are handrails on both sides of the stairs. Fix handrails that are broken or loose. Make sure that handrails are as long as the stairways.  Check any carpeting to make sure that it is firmly attached to the stairs. Fix any carpet that is loose or worn.  Avoid having throw rugs at the top or bottom of stairways, or secure the rugs with carpet tape to prevent them from moving.  Make sure that you have a light switch at the top of the stairs and the bottom of the stairs. If you do not have them, have them installed. What are some other fall prevention tips?  Wear closed-toe shoes that fit well and support your feet. Wear shoes that have rubber soles or low heels.  When you use a stepladder, make sure that it is completely opened and that the sides are firmly locked. Have someone hold the ladder while you are using it. Do not climb a closed stepladder.  Add color or contrast paint or tape to grab bars and handrails in your home. Place contrasting color strips on the first and last steps.  Use mobility aids as needed, such as canes, walkers, scooters, and crutches.  Turn on lights if it is dark. Replace any light bulbs that burn out.  Set up furniture so that there are clear paths. Keep the furniture in the same spot.  Fix any uneven floor  surfaces.  Choose a carpet design that does not hide the edge of steps of a stairway.  Be aware of any and all pets.  Review your medicines with your healthcare provider. Some medicines can cause dizziness or changes in blood pressure, which increase your risk of falling. Talk with your health care provider about other ways that you can decrease your risk of falls. This may include working with a physical therapist or trainer to improve your strength, balance, and endurance. This information is not intended to replace advice given to you by your health care provider. Make sure  you discuss any questions you have with your health care provider. Document Released: 08/25/2002 Document Revised: 02/01/2016 Document Reviewed: 10/09/2014 Elsevier Interactive Patient Education  2017 [employer].

## 2017-05-10 NOTE — Progress Notes (Signed)
Subjective:   Steven Ward is a 82 y.o. male who presents for Medicare Annual/Subsequent preventive examination.  The Patient was informed that the wellness visit is to identify future health risk and educate and initiate measures that can reduce risk for increased disease through the lifespan.    Annual Wellness Assessment  Reports health as   Preventive Screening -Counseling & Management  Medicare Annual Preventive Care Visit - Subsequent Last OV in July  Lives alone Has one son here Has one son in [place]; 7 or 91 Some are here [place] grands x 2  Lives in a condo 2 story Bedrooms upstairs Does his own cooking; grocery shopping Is not thinking about quitting at this time  Goes to see GF x 1 per day VS reviewed;   Diet - Has salads and vegetables Pasta Had 1/2 banana and 3 to 4 cookies Checks   BMI 29 - still losing weight  Exercise - in the home with ADLs Reviewed some sitter exercises he can do   Dental - has dentures upper and lowers   Sleep patterns: yes   Pain sitting down It is not has bad Walk it increases  Eyes checked q 3 months;  Has glaucoma in one eye   Refused hearing test but states he does not hear as well in right ear Discussed Div of HOH assisting with home hearing aid and agreed to get info   Advanced Directive; Reviewed advanced directive and agreed to receipt of information and discussion.  Focused face to face x  20 minutes discussing HCPOA and Living will and reviewed all the questions in the [place] forms. The patient voices understanding of HCPOA; LW reviewed and information provided on each question. Educated on how to revoke this HCPOA or LW at any time.   Also  discussed life prolonging measures (given a few examples) and where he could choose to initiate or not;  the ability to given the HCPOA power to change his living will or not if he cannot speak for himself; as well as finalizing the will by 2 unrelated  witnesses and notary.  Will call for questions and given information on [facility] pastoral department for further assistance.     Depression; depressed in the am but better through the day Needs a job but can't work. Did drive a school bus. Does have a friend he visits every day  Patient Care Team: [name], MD as PCP - General   Immunization History  Administered Date(s) Administered  . Influenza Split 08/22/2011  . Influenza Whole 06/18/2008, 07/13/2010  . Influenza,inj,Quad PF,6+ Mos 05/29/2013  . Influenza-Unspecified 06/19/2015  . Pneumococcal Conjugate-13 07/27/2014  . Td 09/18/2006  . Tdap 10/14/2013  . Zoster 04/10/2012   Required Immunizations needed today  Screening test up to date or reviewed for plan of completion Health Maintenance Due  Topic Date Due  . PNA vac Low Risk Adult (2 of 2 - PPSV23) 07/28/2015  . OPHTHALMOLOGY EXAM  07/11/2016  . INFLUENZA VACCINE  04/18/2017    Flu vaccine declines today; will take it later in Sept Had eye exam; but cannot remember the MD- states he is followed q 3 months for possible glaucoma;  States he does not feel he needs PSV 23; Is sure he had one at 11  Given information on this          Objective:    Vitals: BP 124/60   Pulse (!) 57   Ht 5'  8" (1.727 m)   Wt 195 lb (88.5 kg)   SpO2 97%   BMI 29.65 kg/m   Body mass index is 29.65 kg/m.  Tobacco History  Smoking Status  . Former Smoker  . Packs/day: 1.00  . Years: 20.00  . Types: Cigarettes  . Quit date: 12/28/1968  Smokeless Tobacco  . Never Used     Counseling given: Yes   Past Medical History:  Diagnosis Date  . ABNORMAL THYROID FUNCTION TESTS 01/17/2010  . ALLERGIC RHINITIS 12/01/2008  . Colon cancer ([place])   . COLONIC POLYPS, HX OF 12/01/2008  . DIABETES MELLITUS, TYPE II 12/01/2008  . EDEMA LEG 05/04/2009  . ESSENTIAL HYPERTENSION 12/01/2008  . Exocrine pancreatic insufficiency   . GALLSTONES 04/07/2009  . GERD 12/01/2008  . Hay  fever   . SPONDYLOSIS, LUMBAR 12/01/2008   Past Surgical History:  Procedure Laterality Date  . COLON SURGERY     resection 1990 for cancer   Family History  Problem Relation Age of Onset  . Cancer Other        colon  . Diabetes Other   . Stroke Other    History  Sexual Activity  . Sexual activity: Not on file    Outpatient Encounter Prescriptions as of 05/10/2017  Medication Sig  . amLODipine (NORVASC) 5 MG tablet Take 5 mg by mouth.  Marland Kitchen aspirin 81 MG tablet Take 81 mg by mouth daily.  . benazepril (LOTENSIN) 20 MG tablet Take 20 mg by mouth.  . Blood Glucose Monitoring Suppl (ONE TOUCH ULTRA 2) W/DEVICE KIT Use as directed.  . glyBURIDE (DIABETA) 5 MG tablet Take 5 mg by mouth.  . hydrochlorothiazide (HYDRODIURIL) 25 MG tablet TAKE ONE-HALF TABLET BY MOUTH EVERY DAY  . Lancets (ONETOUCH ULTRASOFT) lancets Check 3 times daily. E11.9  . metFORMIN (GLUCOPHAGE-XR) 750 MG 24 hr tablet TAKE ONE TABLET BY MOUTH ONCE DAILY WITH  BREAKFAST  . ONE TOUCH ULTRA TEST test strip Test blood sugar 3 times a day  . Pancrelipase, Lip-Prot-Amyl, (CREON) 12000 UNITS CPEP Take by mouth 3 (three) times daily before meals.    . polyethylene glycol (MIRALAX / GLYCOLAX) packet Take 17 g by mouth daily as needed for mild constipation.  . sertraline (ZOLOFT) 50 MG tablet TAKE ONE TABLET BY MOUTH ONCE DAILY  . tamsulosin (FLOMAX) 0.4 MG CAPS capsule TAKE ONE CAPSULE BY MOUTH ONCE DAILY. (Patient taking differently: Take 0.4 mg by mouth daily after supper. )  . triamcinolone cream (KENALOG) 0.1 % APPLY  CREAM EXTERNALLY TO AFFECTED AREA TWICE DAILY AS NEEDED  . traMADol (ULTRAM) 50 MG tablet Take 1 tablet (50 mg total) by mouth every 6 (six) hours as needed. (Patient not taking: Reported on 05/10/2017)   No facility-administered encounter medications on file as of 05/10/2017.     Activities of Daily Living No flowsheet data found.  Patient Care Team: [name], MD as PCP - General     Assessment:   Exercise Activities and Dietary recommendations    Goals    . patient          Will plan a vacation  Take a trip to the mountains; [place]       Fall Risk Fall Risk  05/10/2017 12/05/2016 08/07/2016 05/25/2015 03/26/2014  Falls in the past year? _0    Depression Screen PHQ 2/9 Scores 05/10/2017 12/05/2016 08/07/2016 05/25/2015  PHQ - 2 Score 0 0 0 0    Cognitive Function  Immunization History  Administered Date(s) Administered  . Influenza Split 08/22/2011  . Influenza Whole 06/18/2008, 07/13/2010  . Influenza,inj,Quad PF,6+ Mos 05/29/2013  . Influenza-Unspecified 06/19/2015  . Pneumococcal Conjugate-13 07/27/2014  . Td 09/18/2006  . Tdap 10/14/2013  . Zoster 04/10/2012   Screening Tests Health Maintenance  Topic Date Due  . PNA vac Low Risk Adult (2 of 2 - PPSV23) 07/28/2015  . OPHTHALMOLOGY EXAM  07/11/2016  . INFLUENZA VACCINE  04/18/2017  . HEMOGLOBIN A1C  10/07/2017  . FOOT EXAM  04/06/2018  . TETANUS/TDAP  10/15/2023      Plan:      PCP Notes   Health Maintenance Flu vaccine declines today; will take it later in Sept Had eye exam; but cannot remember the MD- states he is followed q 3 months for possible glaucoma;  States he does not feel he needs PSV 23; Is sure he had one at 14  Given information on this    Abnormal Screens  None; no cognitive issues noted during assessment;  Very engaging   Referrals  none  Patient concerns; Discussed pain in back - independent with ADls States he was not taking tramadol due to fear of dependency; Educated regarding benefit of pain being reduced. Decided to try this.  Will plan to take his miralax as needed    Nurse Concerns; None at this time. Depression seems to be managed  Next PCP  08/08/2017    I have personally reviewed and noted the following in the patient's chart:   . Medical and social history . Use of alcohol, tobacco or illicit drugs   . Current medications and supplements . Functional ability and status . Nutritional status . Physical activity . Advanced directives . List of other physicians . Hospitalizations, surgeries, and ER visits in previous 12 months . Vitals . Screenings to include cognitive, depression, and falls . Referrals and appointments  In addition, I have reviewed and discussed with patient certain preventive protocols, quality metrics, and best practice recommendations. A written personalized care plan for preventive services as well as general preventive health recommendations were provided to patient.     Wynetta Fines, RN  05/10/2017

## 2017-07-18 DIAGNOSIS — H01021 Squamous blepharitis right upper eyelid: Secondary | ICD-10-CM | POA: Diagnosis not present

## 2017-07-18 DIAGNOSIS — H01025 Squamous blepharitis left lower eyelid: Secondary | ICD-10-CM | POA: Diagnosis not present

## 2017-07-18 DIAGNOSIS — Z961 Presence of intraocular lens: Secondary | ICD-10-CM | POA: Diagnosis not present

## 2017-07-18 DIAGNOSIS — H01024 Squamous blepharitis left upper eyelid: Secondary | ICD-10-CM | POA: Diagnosis not present

## 2017-07-18 DIAGNOSIS — H401132 Primary open-angle glaucoma, bilateral, moderate stage: Secondary | ICD-10-CM | POA: Diagnosis not present

## 2017-07-18 DIAGNOSIS — H01022 Squamous blepharitis right lower eyelid: Secondary | ICD-10-CM | POA: Diagnosis not present

## 2017-07-25 ENCOUNTER — Other Ambulatory Visit: Payer: Self-pay | Admitting: Family Medicine

## 2017-07-26 ENCOUNTER — Telehealth: Payer: Self-pay | Admitting: Family Medicine

## 2017-07-26 NOTE — Telephone Encounter (Signed)
Pt request refill  ONE TOUCH ULTRA TEST test strip  [facility] - [place], [place]

## 2017-07-27 MED ORDER — GLUCOSE BLOOD VI STRP: ORAL_STRIP | 2 refills | Status: DC

## 2017-07-27 NOTE — Telephone Encounter (Signed)
rx sent

## 2017-08-07 ENCOUNTER — Ambulatory Visit: Payer: PPO | Admitting: Family Medicine

## 2017-08-07 ENCOUNTER — Encounter: Payer: Self-pay | Admitting: Family Medicine

## 2017-08-07 VITALS — BP 110/70 | HR 92 | Temp 98.1°F | Wt 195.3 lb

## 2017-08-07 DIAGNOSIS — E1122 Type 2 diabetes mellitus with diabetic chronic kidney disease: Secondary | ICD-10-CM | POA: Diagnosis not present

## 2017-08-07 DIAGNOSIS — I10 Essential (primary) hypertension: Secondary | ICD-10-CM

## 2017-08-07 DIAGNOSIS — E1121 Type 2 diabetes mellitus with diabetic nephropathy: Secondary | ICD-10-CM

## 2017-08-07 DIAGNOSIS — R251 Tremor, unspecified: Secondary | ICD-10-CM | POA: Diagnosis not present

## 2017-08-07 DIAGNOSIS — N183 Chronic kidney disease, stage 3 (moderate): Secondary | ICD-10-CM

## 2017-08-07 LAB — POCT GLYCOSYLATED HEMOGLOBIN (HGB A1C): Hemoglobin A1C: 6.3

## 2017-08-07 NOTE — Progress Notes (Signed)
Subjective:     Patient ID: Steven Ward, male   DOB: 22-May-1925, 83 y.o.   MRN: [id number]  HPI Patient seen for medical follow-up. He has history of type 2 diabetes, past history of atrial fibrillation, hypertension, dyslipidemia, stage III chronic kidney disease, and lumbar stenosis. His activities are somewhat limited by his chronic back issues. Pain is fairly stable currently. He states that he frequently feels "off balance". No recent falls. No vertigo. No syncope. Medications reviewed. Compliant with all.  Denies any recent hypoglycemic symptoms. A1c stable with 6.3% today.  He complains of increasing tremor mostly right upper extremity but also left. Progressive for over a year. He states that when his hand is "cold" tremor seems worse. He denies any caffeine or alcohol intake. He is not aware of any family history of tremor. Denies any weakness.  Past Medical History:  Diagnosis Date  . ABNORMAL THYROID FUNCTION TESTS 01/17/2010  . ALLERGIC RHINITIS 12/01/2008  . Colon cancer ([place])   . COLONIC POLYPS, HX OF 12/01/2008  . DIABETES MELLITUS, TYPE II 12/01/2008  . EDEMA LEG 05/04/2009  . ESSENTIAL HYPERTENSION 12/01/2008  . Exocrine pancreatic insufficiency   . GALLSTONES 04/07/2009  . GERD 12/01/2008  . Hay fever   . SPONDYLOSIS, LUMBAR 12/01/2008   Past Surgical History:  Procedure Laterality Date  . COLON SURGERY     resection 1990 for cancer    reports that he quit smoking about 48 years ago. His smoking use included cigarettes. He has a 20.00 pack-year smoking history. he has never used smokeless tobacco. He reports that he does not drink alcohol. His drug history is not on file. family history includes Cancer in his other; Diabetes in his other; Stroke in his other. Allergies  Allergen Reactions  . Amoxicillin     REACTION: rash, dizziness     Review of Systems  Constitutional: Negative for fatigue.  Eyes: Negative for visual disturbance.  Respiratory: Negative for  cough, chest tightness and shortness of breath.   Cardiovascular: Negative for chest pain and palpitations.  Genitourinary: Negative for dysuria.  Neurological: Positive for tremors. Negative for dizziness, seizures, syncope, speech difficulty, weakness, light-headedness and headaches.  Psychiatric/Behavioral: Negative for confusion.       Objective:   Physical Exam  Constitutional: He is oriented to person, place, and time. He appears well-developed and well-nourished.  Cardiovascular: Normal rate and regular rhythm.  Pulmonary/Chest: Effort normal and breath sounds normal. No respiratory distress. He has no wheezes. He has no rales.  Musculoskeletal: He exhibits edema.  He has trace to 1+ edema ankles and lower legs bilaterally. This is chronic and unchanged  Neurological: He is alert and oriented to person, place, and time. No cranial nerve deficit.  Patient has tremor upper extremities right hand greater than left. No rigidity. No focal weakness.  Psychiatric: He has a normal mood and affect. His behavior is normal.       Assessment:     #1 type 2 diabetes well controlled with A1c 6.3%  #2 hypertension stable and at goal  #3 chronic kidney disease stage III  #4 upper extremity tremor-progressive over the past year    Plan:     -Continue current medications -We discussed setting up urology referral to evaluate his tremor and he would like to proceed with that -He's had physical therapy in the past. We again discussed setting up some therapy to try to help with balance issues and he declines at this time. We have suggested that  he consider using at least a cane at all times for some additional support but he declines  [name] MD [facility]

## 2017-08-08 ENCOUNTER — Ambulatory Visit: Payer: PPO | Admitting: Podiatry

## 2017-08-08 ENCOUNTER — Encounter: Payer: Self-pay | Admitting: Podiatry

## 2017-08-08 DIAGNOSIS — B351 Tinea unguium: Secondary | ICD-10-CM

## 2017-08-08 DIAGNOSIS — M79676 Pain in unspecified toe(s): Secondary | ICD-10-CM | POA: Diagnosis not present

## 2017-08-08 DIAGNOSIS — E119 Type 2 diabetes mellitus without complications: Secondary | ICD-10-CM

## 2017-08-08 NOTE — Progress Notes (Signed)
Patient ID: Steven Ward, male   DOB: [birth year], 83 y.o.   MRN: [id number] Complaint:  Visit Type: Patient returns to my office for continued preventative foot care services. Complaint: Patient states" my nails have grown long and thick and become painful to walk and wear shoes" Patient has been diagnosed with DM with no  foot complications. He presents for preventative foot care services. No changes to ROS  Podiatric Exam: Vascular: dorsalis pedis and posterior tibial pulses are palpable bilateral. Capillary return is immediate. Temperature gradient is WNL. Skin turgor WNL  Sensorium: Normal Semmes Weinstein monofilament test. Normal tactile sensation bilaterally. Nail Exam: Pt has thick disfigured discolored nails with subungual debris noted bilateral entire nail hallux through fifth toenails Ulcer Exam: There is no evidence of ulcer or pre-ulcerative changes or infection. Orthopedic Exam: Muscle tone and strength are WNL. No limitations in general ROM. No crepitus or effusions noted. Foot type and digits show no abnormalities. Bony prominences are unremarkable. Skin: No Porokeratosis. No infection or ulcers  Diagnosis:  Tinea unguium, Pain in right toe, pain in left toes  Treatment & Plan Procedures and Treatment: Consent by patient was obtained for treatment procedures. The patient understood the discussion of treatment and procedures well. All questions were answered thoroughly reviewed. Debridement of mycotic and hypertrophic toenails, 1 through 5 bilateral and clearing of subungual debris. No ulceration, no infection noted.  Return Visit-Office Procedure: Patient instructed to return to the office for a follow up visit 10 weeks  for continued evaluation and treatment.     Gardiner Barefoot DPM

## 2017-08-14 ENCOUNTER — Encounter: Payer: Self-pay | Admitting: Neurology

## 2017-08-17 ENCOUNTER — Other Ambulatory Visit: Payer: Self-pay | Admitting: Family Medicine

## 2017-08-24 NOTE — Progress Notes (Deleted)
Steven Ward was seen today in the movement disorders clinic for neurologic consultation at the request of Burchette, Alinda Sierras, MD.  The consultation is for the evaluation of tremor.  The first symptom(s) the patient noticed was {parkinsons general sx:18033} and this was {NUMBERS;0-15 BY 1:408015} {days/wks/mos/yrs:310907}.     Tremor: {yes no:314532}   How long has it been going on? One year  At rest or with activation?  ***  When is it noted the most?  ***  Fam hx of tremor?  {yes no:314532}  Located where?  ***R>LUE  Affected by caffeine:  {yes no:314532}  Affected by alcohol:  {yes no:314532}  Affected by stress:  {yes no:314532}  Affected by fatigue:  {yes no:314532}  Spills soup if on spoon:  {yes no:314532}  Spills glass of liquid if full:  {yes no:314532}  Affects ADL's (tying shoes, brushing teeth, etc):  {yes no:314532}   Other specific sxs: Voice: *** Sleep: ***  Vivid Dreams:  {yes no:314532}  Acting out dreams:  {yes no:314532} Wet Pillows: {yes no:314532} Postural symptoms:  {yes no:314532}  Falls?  {yes no:314532} Bradykinesia symptoms: {parkinson brady:18041} Loss of smell:  {yes no:314532} Loss of taste:  {yes no:314532} Urinary Incontinence:  {yes no:314532} Difficulty Swallowing:  {yes no:314532} Handwriting, micrographia: {yes no:314532} Trouble with ADL's:  {yes no:314532}  Trouble buttoning clothing: {yes no:314532} Depression:  {yes no:314532} Memory changes:  {yes no:314532} Hallucinations:  {yes no:314532}  visual distortions: {yes no:314532} N/V:  {yes no:314532} Lightheaded:  {yes no:314532}  Syncope: {yes no:314532} Diplopia:  {yes no:314532} Dyskinesia:  {yes no:314532}  Neuroimaging of the brain has not previously been performed.  It *** available for my review today.  PREVIOUS MEDICATIONS: {Parkinson's RX:18200}  ALLERGIES:   Allergies  Allergen Reactions  . Amoxicillin     REACTION: rash, dizziness    CURRENT  MEDICATIONS:  Outpatient Encounter Medications as of 08/28/2017  Medication Sig  . amLODipine (NORVASC) 5 MG tablet Take 5 mg by mouth.  Marland Kitchen aspirin 81 MG tablet Take 81 mg by mouth daily.  . benazepril (LOTENSIN) 20 MG tablet Take 20 mg by mouth.  . Blood Glucose Monitoring Suppl (ONE TOUCH ULTRA 2) W/DEVICE KIT Use as directed.  Marland Kitchen glucose blood (ONE TOUCH ULTRA TEST) test strip Test blood sugar 3 times a day  . glyBURIDE (DIABETA) 5 MG tablet Take 5 mg by mouth.  . hydrochlorothiazide (HYDRODIURIL) 25 MG tablet TAKE ONE-HALF TABLET BY MOUTH EVERY DAY  . Lancets (ONETOUCH ULTRASOFT) lancets Check 3 times daily. E11.9  . metFORMIN (GLUCOPHAGE-XR) 750 MG 24 hr tablet TAKE 1 TABLET BY MOUTH ONCE DAILY WITH BREAKFAST  . Pancrelipase, Lip-Prot-Amyl, (CREON) 12000 UNITS CPEP Take by mouth 3 (three) times daily before meals.    . polyethylene glycol (MIRALAX / GLYCOLAX) packet Take 17 g by mouth daily as needed for mild constipation.  . sertraline (ZOLOFT) 50 MG tablet TAKE 1 TABLET BY MOUTH ONCE DAILY  . tamsulosin (FLOMAX) 0.4 MG CAPS capsule TAKE ONE CAPSULE BY MOUTH ONCE DAILY. (Patient taking differently: Take 0.4 mg by mouth daily after supper. )  . triamcinolone cream (KENALOG) 0.1 % APPLY  CREAM EXTERNALLY TO AFFECTED AREA TWICE DAILY AS NEEDED   No facility-administered encounter medications on file as of 08/28/2017.     PAST MEDICAL HISTORY:   Past Medical History:  Diagnosis Date  . ABNORMAL THYROID FUNCTION TESTS 01/17/2010  . ALLERGIC RHINITIS 12/01/2008  . Colon cancer ([place])   . COLONIC POLYPS, HX OF  12/01/2008  . DIABETES MELLITUS, TYPE II 12/01/2008  . EDEMA LEG 05/04/2009  . ESSENTIAL HYPERTENSION 12/01/2008  . Exocrine pancreatic insufficiency   . GALLSTONES 04/07/2009  . GERD 12/01/2008  . Hay fever   . SPONDYLOSIS, LUMBAR 12/01/2008    PAST SURGICAL HISTORY:   Past Surgical History:  Procedure Laterality Date  . COLON SURGERY     resection 1990 for cancer    SOCIAL  HISTORY:   Social History   Socioeconomic History  . Marital status: Widowed    Spouse name: Not on file  . Number of children: 2  . Years of education: Not on file  . Highest education level: Not on file  Social Needs  . Financial resource strain: Not on file  . Food insecurity - worry: Not on file  . Food insecurity - inability: Not on file  . Transportation needs - medical: Not on file  . Transportation needs - non-medical: Not on file  Occupational History  . Occupation: bus Education administrator: [employer]  Tobacco Use  . Smoking status: Former Smoker    Packs/day: 1.00    Years: 20.00    Pack years: 20.00    Types: Cigarettes    Last attempt to quit: 12/28/1968    Years since quitting: 48.6  . Smokeless tobacco: Never Used  Substance and Sexual Activity  . Alcohol use: No  . Drug use: Not on file  . Sexual activity: Not on file  Other Topics Concern  . Not on file  Social History Narrative  . Not on file    FAMILY HISTORY:   Family Status  Relation Name Status  . Other  (Not Specified)    ROS:  A complete 10 system review of systems was obtained and was unremarkable apart from what is mentioned above.  PHYSICAL EXAMINATION:    VITALS:  There were no vitals filed for this visit.  GEN:  The patient appears stated age and is in NAD. HEENT:  Normocephalic, atraumatic.  The mucous membranes are moist. The superficial temporal arteries are without ropiness or tenderness. CV:  RRR Lungs:  CTAB Neck/HEME:  There are no carotid bruits bilaterally.  Neurological examination:  Orientation: The patient is alert and oriented x3. Fund of knowledge is appropriate.  Recent and remote memory are intact.  Attention and concentration are normal.    Able to name objects and repeat phrases. Cranial nerves: There is good facial symmetry. Pupils are equal round and reactive to light bilaterally. Fundoscopic exam reveals clear margins bilaterally. Extraocular muscles  are intact. The visual fields are full to confrontational testing. The speech is fluent and clear. Soft palate rises symmetrically and there is no tongue deviation. Hearing is intact to conversational tone. Sensation: Sensation is intact to light and pinprick throughout (facial, trunk, extremities). Vibration is intact at the bilateral big toe. There is no extinction with double simultaneous stimulation. There is no sensory dermatomal level identified. Motor: Strength is 5/5 in the bilateral upper and lower extremities.   Shoulder shrug is equal and symmetric.  There is no pronator drift. Deep tendon reflexes: Deep tendon reflexes are 2/4 at the bilateral biceps, triceps, brachioradialis, patella and achilles. Plantar responses are downgoing bilaterally.  Movement examination: Tone: There is ***tone in the bilateral upper extremities.  The tone in the lower extremities is ***.  Abnormal movements: *** Coordination:  There is *** decremation with RAM's, *** Gait and Station: The patient has *** difficulty arising out of  a deep-seated chair without the use of the hands. The patient's stride length is ***.  The patient has a *** pull test.      Labs:  Lab Results  Component Value Date   TSH 5.72 (H) 04/19/2016     Chemistry      Component Value Date/Time   NA 140 04/06/2017 1015   K 4.5 04/06/2017 1015   CL 105 04/06/2017 1015   CO2 29 04/06/2017 1015   BUN 28 (H) 04/06/2017 1015   CREATININE 1.42 04/06/2017 1015      Component Value Date/Time   CALCIUM 9.3 04/06/2017 1015   ALKPHOS 68 04/06/2017 1015   AST 14 04/06/2017 1015   ALT 16 04/06/2017 1015   BILITOT 0.6 04/06/2017 1015       ASSESSMENT/PLAN:  ***  -TSH was abnormal about a year and a half ago.  We will repeat that but in general, hypothyroidism shouldn't contribute significantly to tremor.  Cc:  [name], MD

## 2017-08-28 ENCOUNTER — Ambulatory Visit: Payer: PPO | Admitting: Neurology

## 2017-09-20 NOTE — Progress Notes (Signed)
Steven Ward was seen today in the movement disorders clinic for neurologic consultation at the request of Burchette, Alinda Sierras, MD.  The consultation is for the evaluation of tremor.  Tremor: Yes.     How long has it been going on? One year  At rest or with activation?  At rest  Fam hx of tremor?  Yes.    Located where?  RUE only  Affected by caffeine:  No.  Affected by alcohol:  Doesn't drink EtOH  Affected by stress:  unknown  Other specific sxs: Voice: a little softer Sleep: sleeps well  Vivid Dreams:  Yes.    Acting out dreams:  No. Wet Pillows: No. Postural symptoms:  Yes.    Falls?  Yes.  , one time, 3-4 months ago, didn't get hurt Bradykinesia symptoms: difficulty getting out of a chair Loss of smell:  Yes.   Loss of taste:  Yes.   Urinary Incontinence:  No. Difficulty Swallowing:  No. Handwriting, micrographia: Yes.   Trouble with ADL's:  Yes.    Trouble buttoning clothing: Yes.  , "my fingers ache" Depression:  No. Memory changes:  minimal Hallucinations:  No.  visual distortions: No. N/V:  No. Lightheaded:  No.  Syncope: No. Diplopia:  No.   Neuroimaging of the brain has not previously been performed.   PREVIOUS MEDICATIONS: none to date  ALLERGIES:   Allergies  Allergen Reactions  . Amoxicillin     REACTION: rash, dizziness    CURRENT MEDICATIONS:  Outpatient Encounter Medications as of 09/24/2017  Medication Sig  . amLODipine (NORVASC) 5 MG tablet Take 5 mg by mouth.  Marland Kitchen aspirin 81 MG tablet Take 81 mg by mouth daily.  . benazepril (LOTENSIN) 20 MG tablet Take 20 mg by mouth.  . Blood Glucose Monitoring Suppl (ONE TOUCH ULTRA 2) W/DEVICE KIT Use as directed.  Marland Kitchen glucose blood (ONE TOUCH ULTRA TEST) test strip Test blood sugar 3 times a day  . glyBURIDE (DIABETA) 5 MG tablet Take 5 mg by mouth.  . hydrochlorothiazide (HYDRODIURIL) 25 MG tablet TAKE ONE-HALF TABLET BY MOUTH EVERY DAY  . Lancets (ONETOUCH ULTRASOFT) lancets Check 3 times daily.  E11.9  . metFORMIN (GLUCOPHAGE-XR) 750 MG 24 hr tablet TAKE 1 TABLET BY MOUTH ONCE DAILY WITH BREAKFAST  . Pancrelipase, Lip-Prot-Amyl, (CREON) 12000 UNITS CPEP Take by mouth 3 (three) times daily before meals.    . polyethylene glycol (MIRALAX / GLYCOLAX) packet Take 17 g by mouth daily as needed for mild constipation.  . sertraline (ZOLOFT) 50 MG tablet TAKE 1 TABLET BY MOUTH ONCE DAILY  . tamsulosin (FLOMAX) 0.4 MG CAPS capsule TAKE ONE CAPSULE BY MOUTH ONCE DAILY. (Patient taking differently: Take 0.4 mg by mouth daily after supper. )  . triamcinolone cream (KENALOG) 0.1 % APPLY  CREAM EXTERNALLY TO AFFECTED AREA TWICE DAILY AS NEEDED   No facility-administered encounter medications on file as of 09/24/2017.     PAST MEDICAL HISTORY:   Past Medical History:  Diagnosis Date  . ABNORMAL THYROID FUNCTION TESTS 01/17/2010  . ALLERGIC RHINITIS 12/01/2008  . Colon cancer ([place])   . COLONIC POLYPS, HX OF 12/01/2008  . DIABETES MELLITUS, TYPE II 12/01/2008  . EDEMA LEG 05/04/2009  . ESSENTIAL HYPERTENSION 12/01/2008  . Exocrine pancreatic insufficiency   . GALLSTONES 04/07/2009  . GERD 12/01/2008  . Hay fever   . SPONDYLOSIS, LUMBAR 12/01/2008    PAST SURGICAL HISTORY:   Past Surgical History:  Procedure Laterality Date  . ANKLE  SURGERY Right   . CATARACT EXTRACTION    . CHOLECYSTECTOMY    . COLON SURGERY     resection 1990 for cancer  . LEG SURGERY Left     SOCIAL HISTORY:   Social History   Socioeconomic History  . Marital status: Widowed    Spouse name: Not on file  . Number of children: 2  . Years of education: Not on file  . Highest education level: Not on file  Social Needs  . Financial resource strain: Not on file  . Food insecurity - worry: Not on file  . Food insecurity - inability: Not on file  . Transportation needs - medical: Not on file  . Transportation needs - non-medical: Not on file  Occupational History  . Occupation: bus Education administrator: [employer]  Tobacco Use  . Smoking status: Former Smoker    Packs/day: 1.00    Years: 20.00    Pack years: 20.00    Types: Cigarettes    Last attempt to quit: 12/28/1968    Years since quitting: 48.7  . Smokeless tobacco: Never Used  Substance and Sexual Activity  . Alcohol use: No  . Drug use: No  . Sexual activity: Not on file  Other Topics Concern  . Not on file  Social History Narrative  . Not on file    FAMILY HISTORY:   Family Status  Relation Name Status  . Other  (Not Specified)  . Mother  Deceased  . Father  Deceased  . Sister  Deceased  . Brother x3 Alive  . Son x2 Alive    ROS:  Occasional paresthesias in the fingers - wears a CTS brace.  A complete 10 system review of systems was obtained and was unremarkable apart from what is mentioned above.  PHYSICAL EXAMINATION:    VITALS:   Vitals:   09/24/17 1244  BP: 128/66  Pulse: 94  SpO2: 95%  Weight: 196 lb (88.9 kg)  Height: '5\' 8"'$  (1.727 m)    GEN:  The patient appears stated age and is in NAD. HEENT:  Normocephalic, atraumatic.  The mucous membranes are moist. The superficial temporal arteries are without ropiness or tenderness. CV:  RRR Lungs:  CTAB Neck/HEME:  There are no carotid bruits bilaterally.  Neurological examination:  Orientation: The patient is alert and oriented x3. Fund of knowledge is appropriate.  Recent and remote memory are intact.  Attention and concentration are normal.    Able to name objects and repeat phrases. Cranial nerves: There is good facial symmetry. There is facial hypomimia.  Pupils are equal round and reactive to light bilaterally. Fundoscopic exam reveals clear margins bilaterally. Extraocular muscles are intact. The visual fields are full to confrontational testing. The speech is fluent and clear but he is hypophonic. Soft palate rises symmetrically and there is no tongue deviation. Hearing is intact to conversational tone. Sensation: Sensation is intact to light and  pinprick throughout (facial, trunk, extremities). Vibration is intact at the bilateral big toe. There is no extinction with double simultaneous stimulation. There is no sensory dermatomal level identified. Motor: Strength is 5/5 in the bilateral upper and lower extremities.   Shoulder shrug is equal and symmetric.  There is no pronator drift. Deep tendon reflexes: Deep tendon reflexes are 2-/4 at the bilateral biceps, triceps, brachioradialis, patella and achilles. Plantar responses are downgoing bilaterally.  Movement examination: Tone: There is mod increased in the RUE and severe in the RLE.  There  is mod increased tone in the LLE/LUE Abnormal movements: There is RUE resting tremor Coordination:  There is decremation with RAM's, with any form of RAMS, including alternating supination and pronation of the forearm, hand opening and closing, finger taps, heel taps and toe taps, R>L Gait and Station: The patient has minimal difficulty arising out of a deep-seated chair without the use of the hands. The patient's stride length is decreased with mild decreased arm swing bilaterally.  He is mildly unsteady in the turn.        Labs:  Lab Results  Component Value Date   TSH 5.72 (H) 04/19/2016     Chemistry      Component Value Date/Time   NA 140 04/06/2017 1015   K 4.5 04/06/2017 1015   CL 105 04/06/2017 1015   CO2 29 04/06/2017 1015   BUN 28 (H) 04/06/2017 1015   CREATININE 1.42 04/06/2017 1015      Component Value Date/Time   CALCIUM 9.3 04/06/2017 1015   ALKPHOS 68 04/06/2017 1015   AST 14 04/06/2017 1015   ALT 16 04/06/2017 1015   BILITOT 0.6 04/06/2017 1015       ASSESSMENT/PLAN:  1.  Idiopathic Parkinsons disease.  The patient has tremor, bradykinesia, rigidity and mild postural instability.  -We discussed the diagnosis as well as pathophysiology of the disease.  We discussed treatment options as well as prognostic indicators.  Patient education was provided.  -We discussed  that it used to be thought that levodopa would increase risk of melanoma but now it is believed that Parkinsons itself likely increases risk of melanoma. he is to get regular skin checks.  -We decided to add carbidopa/levodopa 25/100.  1/2 tab tid x 1 wk, then 1/2 in am & noon & 1 at night for a week, then 1/2 in am &1 at noon &night for a week, then 1 po tid.  Risks, benefits, side effects and alternative therapies were discussed.  The opportunity to ask questions was given and they were answered to the best of my ability.  The patient expressed understanding and willingness to follow the outlined treatment protocols.  -I think that he would benefit from PT but cost is a concern so decided to hold to see how would respond to meds  -We discussed community resources in the area including patient support groups and community exercise programs for PD and pt education was provided to the patient.  Pt leery about exercise as he has back pain.  We will see how responds to carbidopa/levodopa.  He blames R sided stiffness and rigidity on spinal stenosis and I am hoping that he will see that carbidopa/levodopa will help these things  -TSH was abnormal about a year and a half ago.  We will repeat that but in general, hypothyroidism shouldn't contribute to tremor or cause parkinsonism.  -discussed MRI brain given hx of colon CA.  He wanted to hold which I think is reasonable given CA in the 1990's and exam non focal and nonlateralizing.  -met with my social worker today  2.  Constipation  -discussed increased water intake  3.  Follow up is anticipated in the next few months, sooner should new neurologic issues arise.  Much greater than 50% of this visit was spent in counseling and coordinating care.  Total face to face time:  60 min    Cc:  [name], MD

## 2017-09-24 ENCOUNTER — Encounter: Payer: Self-pay | Admitting: Neurology

## 2017-09-24 ENCOUNTER — Other Ambulatory Visit: Payer: PPO

## 2017-09-24 ENCOUNTER — Ambulatory Visit (INDEPENDENT_AMBULATORY_CARE_PROVIDER_SITE_OTHER): Payer: PPO | Admitting: Neurology

## 2017-09-24 VITALS — BP 128/66 | HR 94 | Ht 68.0 in | Wt 196.0 lb

## 2017-09-24 DIAGNOSIS — E039 Hypothyroidism, unspecified: Secondary | ICD-10-CM

## 2017-09-24 DIAGNOSIS — K5901 Slow transit constipation: Secondary | ICD-10-CM

## 2017-09-24 DIAGNOSIS — G20 Parkinson's disease: Secondary | ICD-10-CM

## 2017-09-24 DIAGNOSIS — R251 Tremor, unspecified: Secondary | ICD-10-CM

## 2017-09-24 LAB — TSH: TSH: 5.71 mIU/L — ABNORMAL HIGH (ref 0.40–4.50)

## 2017-09-24 MED ORDER — CARBIDOPA-LEVODOPA 25-100 MG PO TABS: 1.0000 | ORAL_TABLET | Freq: Three times a day (TID) | ORAL | 3 refills | Status: DC

## 2017-09-24 NOTE — Patient Instructions (Signed)
1. Your provider has requested that you have labwork completed today. Please go to [facility] ([id number]) on the second floor of this building before leaving the office today. You do not need to check in. If you are not called within 15 minutes please check with the front desk.   2. Start Carbidopa Levodopa as follows:  Take 1/2 tablet three times daily, at least 30 minutes before meals, for one week  Then take 1/2 tablet in the morning, 1/2 tablet in the afternoon, 1 tablet in the evening, at least 30 minutes before meals, for one week  Then take 1/2 tablet in the morning, 1 tablet in the afternoon, 1 tablet in the evening, at least 30 minutes before meals, for one week  Then take 1 tablet three times daily, at least 30 minutes before meals

## 2017-09-25 ENCOUNTER — Telehealth: Payer: Self-pay | Admitting: Neurology

## 2017-09-25 NOTE — Telephone Encounter (Signed)
-----   Message from Holt, DO sent at 09/25/2017  7:26 AM EST ----- Let pt know that TSH hasn't really changed over the last year, but its still a little bit low.  Luvenia Starch, just have him f/u with PCP and I copied him on these labs as well.  Bruce, just FYI.  Thanks!

## 2017-09-25 NOTE — Telephone Encounter (Signed)
Patient made aware.

## 2017-10-03 ENCOUNTER — Telehealth: Payer: Self-pay | Admitting: Family Medicine

## 2017-10-03 MED ORDER — BLOOD GLUCOSE MONITOR KIT: PACK | 0 refills | Status: AC

## 2017-10-03 NOTE — Telephone Encounter (Signed)
Copied from [place] [id number]. Topic: Quick Communication - Rx Refill/Question >> Oct 03, 2017  9:08 AM Synthia Innocent wrote: Medication: Needs new Glucose device, ultrablue? Pharmacy states new order for one.   Has the patient contacted their pharmacy? Yes.     (Agent: If no, request that the patient contact the pharmacy for the refill.)   Preferred Pharmacy (with phone number or street name): [facility]    Agent: Please be advised that RX refills may take up to 3 business days. We ask that you follow-up with your pharmacy.

## 2017-10-03 NOTE — Telephone Encounter (Signed)
Routed back to provider 

## 2017-10-03 NOTE — Telephone Encounter (Signed)
rx sent

## 2017-10-17 ENCOUNTER — Encounter: Payer: Self-pay | Admitting: Podiatry

## 2017-10-17 ENCOUNTER — Ambulatory Visit: Payer: PPO | Admitting: Podiatry

## 2017-10-17 DIAGNOSIS — E119 Type 2 diabetes mellitus without complications: Secondary | ICD-10-CM

## 2017-10-17 DIAGNOSIS — M79676 Pain in unspecified toe(s): Secondary | ICD-10-CM

## 2017-10-17 DIAGNOSIS — B351 Tinea unguium: Secondary | ICD-10-CM

## 2017-10-17 NOTE — Progress Notes (Signed)
Patient ID: Steven Ward, male   DOB: 10/08/1924, 84 y.o.   MRN: [id number] Complaint:  Visit Type: Patient returns to my office for continued preventative foot care services. Complaint: Patient states" my nails have grown long and thick and become painful to walk and wear shoes" Patient has been diagnosed with DM with no  foot complications. He presents for preventative foot care services. No changes to ROS  Podiatric Exam: Vascular: dorsalis pedis and posterior tibial pulses are palpable bilateral. Capillary return is immediate. Temperature gradient is WNL. Skin turgor WNL  Sensorium: Normal Semmes Weinstein monofilament test. Normal tactile sensation bilaterally. Nail Exam: Pt has thick disfigured discolored nails with subungual debris noted bilateral entire nail hallux through fifth toenails Ulcer Exam: There is no evidence of ulcer or pre-ulcerative changes or infection. Orthopedic Exam: Muscle tone and strength are WNL. No limitations in general ROM. No crepitus or effusions noted. Foot type and digits show no abnormalities. Bony prominences are unremarkable. Skin: No Porokeratosis. No infection or ulcers  Diagnosis:  Tinea unguium, Pain in right toe, pain in left toes  Treatment & Plan Procedures and Treatment: Consent by patient was obtained for treatment procedures. The patient understood the discussion of treatment and procedures well. All questions were answered thoroughly reviewed. Debridement of mycotic and hypertrophic toenails, 1 through 5 bilateral and clearing of subungual debris. No ulceration, no infection noted.  Return Visit-Office Procedure: Patient instructed to return to the office for a follow up visit 10 weeks  for continued evaluation and treatment.     Gardiner Barefoot DPM

## 2017-10-21 ENCOUNTER — Other Ambulatory Visit: Payer: Self-pay | Admitting: Family Medicine

## 2017-10-24 MED ORDER — GLUCOSE BLOOD VI STRP: ORAL_STRIP | 2 refills | Status: DC

## 2017-10-24 MED ORDER — ONETOUCH ULTRASOFT LANCETS MISC: 3 refills | Status: DC

## 2017-10-24 NOTE — Telephone Encounter (Signed)
[facility] calling and requesting a 90 day supply of (One Touch Ultra Test) test strips and OneTouch Ultrasoft Lancets   Last OV: 08/07/17 Pharmacy: [facility]

## 2017-10-24 NOTE — Telephone Encounter (Signed)
Copied from [place] [phone] [phone]. Topic: Quick Communication - Rx Refill/Question >> Oct 24, 2017  2:24 PM Carolyn Stare wrote: Medication       glucose blood (ONE TOUCH ULTRA TEST) test strip    Lancets (ONETOUCH ULTRASOFT) lancets   Pharmacy is requesting 90 day supply   patient contacted their pharmacy yes    Preferred Pharmacy   [facility]   Agent: Please be advised that RX refills may take up to 3 business days. We ask that you follow-up with your pharmacy.

## 2017-10-24 NOTE — Telephone Encounter (Signed)
rx sent

## 2017-11-07 ENCOUNTER — Ambulatory Visit (INDEPENDENT_AMBULATORY_CARE_PROVIDER_SITE_OTHER): Payer: PPO | Admitting: Family Medicine

## 2017-11-07 ENCOUNTER — Encounter: Payer: Self-pay | Admitting: Family Medicine

## 2017-11-07 VITALS — BP 110/70 | HR 74 | Temp 97.9°F | Wt 198.0 lb

## 2017-11-07 DIAGNOSIS — I10 Essential (primary) hypertension: Secondary | ICD-10-CM

## 2017-11-07 DIAGNOSIS — E1121 Type 2 diabetes mellitus with diabetic nephropathy: Secondary | ICD-10-CM

## 2017-11-07 DIAGNOSIS — N183 Chronic kidney disease, stage 3 (moderate): Secondary | ICD-10-CM

## 2017-11-07 DIAGNOSIS — E1122 Type 2 diabetes mellitus with diabetic chronic kidney disease: Secondary | ICD-10-CM | POA: Diagnosis not present

## 2017-11-07 LAB — POCT GLYCOSYLATED HEMOGLOBIN (HGB A1C): HEMOGLOBIN A1C: 6.1

## 2017-11-07 NOTE — Patient Instructions (Signed)
Consider reducing the Glyburide to one half tablet daily

## 2017-11-07 NOTE — Progress Notes (Signed)
Subjective:     Patient ID: Steven Ward, male   DOB: [birth year], 83 y.o.   MRN: [id number]  HPI Patient seen for medical follow-up. He has history of type 2 diabetes, hypertension, chronic kidney disease. He recently was evaluated by neurology for tremor and diagnosed with Parkinson's disease started on Sinemet. He has not had any side effects from medication.  History of very well-controlled diabetes. A1c today 6.1%. He remains on metformin and glyburide. No hypoglycemic symptoms recently. He states he is getting eye exams every 6 months  He does not take any statins and had lipids a few months ago with cholesterol 144, HDL 43, LDL 96  Denies any recent falls. He has history of depression on sertraline and he does feel that has helped. 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 ABNORMAL THYROID FUNCTION TESTS 01/17/2010  . ALLERGIC RHINITIS 12/01/2008  . Colon cancer ([place])   . COLONIC POLYPS, HX OF 12/01/2008  . DIABETES MELLITUS, TYPE II 12/01/2008  . EDEMA LEG 05/04/2009  . ESSENTIAL HYPERTENSION 12/01/2008  . Exocrine pancreatic insufficiency   . GALLSTONES 04/07/2009  . GERD 12/01/2008  . Hay fever   . SPONDYLOSIS, LUMBAR 12/01/2008   Past Surgical History:  Procedure Laterality Date  . ANKLE SURGERY Right   . CATARACT EXTRACTION    . CHOLECYSTECTOMY    . COLON SURGERY     resection 1990 for cancer  . LEG SURGERY Left     reports that he quit smoking about 48 years ago. His smoking use included cigarettes. He has a 20.00 pack-year smoking history. he has never used smokeless tobacco. He reports that he does not drink alcohol or use drugs. family history includes Cancer in his mother and other; Diabetes in his other; Stroke in his other. Allergies  Allergen Reactions  . Amoxicillin     REACTION: rash, dizziness     Review of Systems  Constitutional: Negative for fatigue and unexpected weight  change.  Eyes: Negative for visual disturbance.  Respiratory: Negative for cough, chest tightness and shortness of breath.   Cardiovascular: Negative for chest pain, palpitations and leg swelling.  Endocrine: Negative for polydipsia.  Neurological: Positive for tremors. Negative for dizziness, syncope, weakness, light-headedness and headaches.       Objective:   Physical Exam  Constitutional: He is oriented to person, place, and time. He appears well-developed and well-nourished.  HENT:  Right Ear: External ear normal.  Left Ear: External ear normal.  Mouth/Throat: Oropharynx is clear and moist.  Eyes: Pupils are equal, round, and reactive to light.  Neck: Neck supple. No thyromegaly present.  Cardiovascular: Normal rate and regular rhythm.  Pulmonary/Chest: Effort normal and breath sounds normal. No respiratory distress. He has no wheezes. He has no rales.  Musculoskeletal: He exhibits no edema.  Neurological: He is alert and oriented to person, place, and time.       Assessment:     #1 type 2 diabetes well controlled with A1c today 6.1%  #2 hypertension stable and at goal  #3 Parkinson's disease with recent initiation of Sinemet and tolerating well with no dizziness or other side effects  #4 chronic kidney disease    Plan:     -Reduce DiaBeta to 2.5 mg daily to reduce risk of hypoglycemia. If A1c remains well controlled at 3 months will consider discontinuing altogether -Continue close follow-up with neurology regarding his Parkinson's  disease -We have discussed pros and cons of statin use. His lipids are fairly well controlled. We explained though the statin recommendations for diabetics and he declines  [name] MD [facility]

## 2017-12-10 ENCOUNTER — Ambulatory Visit (INDEPENDENT_AMBULATORY_CARE_PROVIDER_SITE_OTHER): Payer: PPO | Admitting: Family Medicine

## 2017-12-10 ENCOUNTER — Encounter: Payer: Self-pay | Admitting: Family Medicine

## 2017-12-10 VITALS — BP 120/70 | HR 70 | Temp 97.9°F | Wt 195.1 lb

## 2017-12-10 DIAGNOSIS — H6123 Impacted cerumen, bilateral: Secondary | ICD-10-CM | POA: Diagnosis not present

## 2017-12-10 DIAGNOSIS — H9193 Unspecified hearing loss, bilateral: Secondary | ICD-10-CM

## 2017-12-10 NOTE — Patient Instructions (Signed)
Earwax Buildup, Adult The ears produce a substance called earwax that helps keep bacteria out of the ear and protects the skin in the ear canal. Occasionally, earwax can build up in the ear and cause discomfort or hearing loss. What increases the risk? This condition is more likely to develop in people who:  Are male.  Are elderly.  Naturally produce more earwax.  Clean their ears often with cotton swabs.  Use earplugs often.  Use in-ear headphones often.  Wear hearing aids.  Have narrow ear canals.  Have earwax that is overly thick or sticky.  Have eczema.  Are dehydrated.  Have excess hair in the ear canal.  What are the signs or symptoms? Symptoms of this condition include:  Reduced or muffled hearing.  A feeling of fullness in the ear or feeling that the ear is plugged.  Fluid coming from the ear.  Ear pain.  Ear itch.  Ringing in the ear.  Coughing.  An obvious piece of earwax that can be seen inside the ear canal.  How is this diagnosed? This condition may be diagnosed based on:  Your symptoms.  Your medical history.  An ear exam. During the exam, your health care provider will look into your ear with an instrument called an otoscope.  You may have tests, including a hearing test. How is this treated? This condition may be treated by:  Using ear drops to soften the earwax.  Having the earwax removed by a health care provider. The health care provider may: ? Flush the ear with water. ? Use an instrument that has a loop on the end (curette). ? Use a suction device.  Surgery to remove the wax buildup. This may be done in severe cases.  Follow these instructions at home:  Take over-the-counter and prescription medicines only as told by your health care provider.  Do not put any objects, including cotton swabs, into your ear. You can clean the opening of your ear canal with a washcloth or facial tissue.  Follow instructions from your health  care provider about cleaning your ears. Do not over-clean your ears.  Drink enough fluid to keep your urine clear or pale yellow. This will help to thin the earwax.  Keep all follow-up visits as told by your health care provider. If earwax builds up in your ears often or if you use hearing aids, consider seeing your health care provider for routine, preventive ear cleanings. Ask your health care provider how often you should schedule your cleanings.  If you have hearing aids, clean them according to instructions from the manufacturer and your health care provider. Contact a health care provider if:  You have ear pain.  You develop a fever.  You have blood, pus, or other fluid coming from your ear.  You have hearing loss.  You have ringing in your ears that does not go away.  Your symptoms do not improve with treatment.  You feel like the room is spinning (vertigo). Summary  Earwax can build up in the ear and cause discomfort or hearing loss.  The most common symptoms of this condition include reduced or muffled hearing and a feeling of fullness in the ear or feeling that the ear is plugged.  This condition may be diagnosed based on your symptoms, your medical history, and an ear exam.  This condition may be treated by using ear drops to soften the earwax or by having the earwax removed by a health care provider.  Do   not put any objects, including cotton swabs, into your ear. You can clean the opening of your ear canal with a washcloth or facial tissue. This information is not intended to replace advice given to you by your health care provider. Make sure you discuss any questions you have with your health care provider. Document Released: 10/12/2004 Document Revised: 11/15/2016 Document Reviewed: 11/15/2016 Elsevier Interactive Patient Education  2018 [employer]  

## 2017-12-10 NOTE — Progress Notes (Signed)
Subjective:     Patient ID: Steven Ward, male   DOB: 08-16-1925, 84 y.o.   MRN: [id number]  HPI Patient here with gradual reduction in hearing over several weeks. Denies any vertigo. He denies any past history of cerumen impactions. No drainage. No ear pain. No exacerbating or alleviating factors  Past Medical History:  Diagnosis Date  . ABNORMAL THYROID FUNCTION TESTS 01/17/2010  . ALLERGIC RHINITIS 12/01/2008  . Colon cancer ([place])   . COLONIC POLYPS, HX OF 12/01/2008  . DIABETES MELLITUS, TYPE II 12/01/2008  . EDEMA LEG 05/04/2009  . ESSENTIAL HYPERTENSION 12/01/2008  . Exocrine pancreatic insufficiency   . GALLSTONES 04/07/2009  . GERD 12/01/2008  . Hay fever   . SPONDYLOSIS, LUMBAR 12/01/2008   Past Surgical History:  Procedure Laterality Date  . ANKLE SURGERY Right   . CATARACT EXTRACTION    . CHOLECYSTECTOMY    . COLON SURGERY     resection 1990 for cancer  . LEG SURGERY Left     reports that he quit smoking about 48 years ago. His smoking use included cigarettes. He has a 20.00 pack-year smoking history. He has never used smokeless tobacco. He reports that he does not drink alcohol or use drugs. family history includes Cancer in his mother and other; Diabetes in his other; Stroke in his other. Allergies  Allergen Reactions  . Amoxicillin     REACTION: rash, dizziness     Review of Systems  Constitutional: Negative for chills and fever.  HENT: Positive for hearing loss. Negative for congestion, ear discharge and ear pain.        Objective:   Physical Exam  Constitutional: He appears well-developed and well-nourished.  HENT:  Mouth/Throat: Oropharynx is clear and moist.  Cerumen impactions bilaterally  Neck: Neck supple.  Cardiovascular: Normal rate.  Pulmonary/Chest: Effort normal and breath sounds normal. No respiratory distress. He has no wheezes. He has no rales.       Assessment:     Bilateral cerumen impactions-removed by irrigation.  Patient's hearing  improved immediately after irrigation    Plan:     -Reassurance -Follow-up in April to reassess his diabetes and other medical problems  [name] MD [facility] at [facility]

## 2017-12-27 ENCOUNTER — Other Ambulatory Visit: Payer: Self-pay | Admitting: Family Medicine

## 2018-01-02 DIAGNOSIS — H401132 Primary open-angle glaucoma, bilateral, moderate stage: Secondary | ICD-10-CM | POA: Diagnosis not present

## 2018-01-02 DIAGNOSIS — Z961 Presence of intraocular lens: Secondary | ICD-10-CM | POA: Diagnosis not present

## 2018-01-02 DIAGNOSIS — H01021 Squamous blepharitis right upper eyelid: Secondary | ICD-10-CM | POA: Diagnosis not present

## 2018-01-02 DIAGNOSIS — H01025 Squamous blepharitis left lower eyelid: Secondary | ICD-10-CM | POA: Diagnosis not present

## 2018-01-02 DIAGNOSIS — H01024 Squamous blepharitis left upper eyelid: Secondary | ICD-10-CM | POA: Diagnosis not present

## 2018-01-02 DIAGNOSIS — H01022 Squamous blepharitis right lower eyelid: Secondary | ICD-10-CM | POA: Diagnosis not present

## 2018-01-11 ENCOUNTER — Ambulatory Visit: Payer: PPO | Admitting: Podiatry

## 2018-01-11 ENCOUNTER — Encounter: Payer: Self-pay | Admitting: Podiatry

## 2018-01-11 DIAGNOSIS — B351 Tinea unguium: Secondary | ICD-10-CM | POA: Diagnosis not present

## 2018-01-11 DIAGNOSIS — M79676 Pain in unspecified toe(s): Secondary | ICD-10-CM | POA: Diagnosis not present

## 2018-01-11 DIAGNOSIS — E119 Type 2 diabetes mellitus without complications: Secondary | ICD-10-CM | POA: Diagnosis not present

## 2018-01-11 NOTE — Progress Notes (Signed)
Patient ID: Steven Ward, male   DOB: 07-27-1925, 84 y.o.   MRN: [id number] Complaint:  Visit Type: Patient returns to my office for continued preventative foot care services. Complaint: Patient states" my nails have grown long and thick and become painful to walk and wear shoes" Patient has been diagnosed with DM with no  foot complications. He presents for preventative foot care services. No changes to ROS  Podiatric Exam: Vascular: dorsalis pedis and posterior tibial pulses are palpable bilateral. Capillary return is immediate. Temperature gradient is WNL. Skin turgor WNL  Sensorium: Normal Semmes Weinstein monofilament test. Normal tactile sensation bilaterally. Nail Exam: Pt has thick disfigured discolored nails with subungual debris noted bilateral entire nail hallux through fifth toenails Ulcer Exam: There is no evidence of ulcer or pre-ulcerative changes or infection. Orthopedic Exam: Muscle tone and strength are WNL. No limitations in general ROM. No crepitus or effusions noted. Foot type and digits show no abnormalities. Bony prominences are unremarkable. Skin: No Porokeratosis. No infection or ulcers  Diagnosis:  Tinea unguium, Pain in right toe, pain in left toes  Treatment & Plan Procedures and Treatment: Consent by patient was obtained for treatment procedures. The patient understood the discussion of treatment and procedures well. All questions were answered thoroughly reviewed. Debridement of mycotic and hypertrophic toenails, 1 through 5 bilateral and clearing of subungual debris. No ulceration, no infection noted. Patient requests an evaluation of right ankle for old ankle fracture x 40 years ago. Return Visit-Office Procedure: Patient instructed to return to the office for a follow up visit 10 weeks  for continued evaluation and treatment.     Gardiner Barefoot DPM

## 2018-01-14 NOTE — Progress Notes (Signed)
Steven Ward was seen today in the movement disorders clinic for neurologic consultation at the request of Burchette, Alinda Sierras, MD.  The consultation is for the evaluation of tremor.  Tremor: Yes.     How long has it been going on? One year  At rest or with activation?  At rest  Fam hx of tremor?  Yes.    Located where?  RUE only  Affected by caffeine:  No.  Affected by alcohol:  Doesn't drink EtOH  Affected by stress:  unknown  Other specific sxs: Voice: a little softer Sleep: sleeps well  Vivid Dreams:  Yes.    Acting out dreams:  No. Wet Pillows: No. Postural symptoms:  Yes.    Falls?  Yes.  , one time, 3-4 months ago, didn't get hurt Bradykinesia symptoms: difficulty getting out of a chair Loss of smell:  Yes.   Loss of taste:  Yes.   Urinary Incontinence:  No. Difficulty Swallowing:  No. Handwriting, micrographia: Yes.   Trouble with ADL's:  Yes.    Trouble buttoning clothing: Yes.  , "my fingers ache" Depression:  No. Memory changes:  minimal Hallucinations:  No.  visual distortions: No. N/V:  No. Lightheaded:  No.  Syncope: No. Diplopia:  No.   01/15/18 update: Patient follows up today.  Records have been reviewed since our last visit.  Patient was diagnosed with Parkinson's disease last visit and started on carbidopa/levodopa 25/100, 1 tablet 3 times a day.  Patient states today that medication is not helping.  Taking medication at 7am/11-12pm/7-8pm.   He states "the medication didn't do nothing but make it worse and my neck hurts."   Patient did have lab work for me since last visit.  His TSH is elevated.  He was advised to follow-up with his primary care physician, which he has done.  meds not added/changed.  PREVIOUS MEDICATIONS: none to date  ALLERGIES:   Allergies  Allergen Reactions  . Amoxicillin     REACTION: rash, dizziness    CURRENT MEDICATIONS:  Outpatient Encounter Medications as of 01/15/2018  Medication Sig  . amLODipine (NORVASC) 5 MG  tablet Take 5 mg by mouth.  . benazepril (LOTENSIN) 20 MG tablet Take 20 mg by mouth.  . carbidopa-levodopa (SINEMET IR) 25-100 MG tablet Take 1 tablet by mouth 3 (three) times daily.  Marland Kitchen glyBURIDE (DIABETA) 5 MG tablet Take 2.5 mg by mouth daily with breakfast.   . hydrochlorothiazide (HYDRODIURIL) 25 MG tablet TAKE ONE-HALF TABLET BY MOUTH EVERY DAY  . metFORMIN (GLUCOPHAGE-XR) 750 MG 24 hr tablet TAKE 1 TABLET BY MOUTH IN THE MORNING WITH BREAKFAST  . Pancrelipase, Lip-Prot-Amyl, (CREON) 12000 UNITS CPEP Take by mouth 3 (three) times daily before meals.    . polyethylene glycol (MIRALAX / GLYCOLAX) packet Take 17 g by mouth daily as needed for mild constipation.  . sertraline (ZOLOFT) 50 MG tablet TAKE 1 TABLET BY MOUTH ONCE DAILY  . tamsulosin (FLOMAX) 0.4 MG CAPS capsule TAKE ONE CAPSULE BY MOUTH ONCE DAILY. (Patient taking differently: Take 0.4 mg by mouth daily after supper. )  . triamcinolone cream (KENALOG) 0.1 % APPLY  CREAM EXTERNALLY TO AFFECTED AREA TWICE DAILY AS NEEDED  . blood glucose meter kit and supplies KIT Dispense based on patient and insurance preference. Use up to four times daily as directed. (FOR ICD-9 250.00, 250.01). (Patient not taking: Reported on 01/15/2018)  . Blood Glucose Monitoring Suppl (ONE TOUCH ULTRA 2) W/DEVICE KIT Use as directed. (Patient not  taking: Reported on 01/15/2018)  . glucose blood (ONE TOUCH ULTRA TEST) test strip Test blood sugar 3 times a day (Patient not taking: Reported on 01/15/2018)  . Lancets (ONETOUCH ULTRASOFT) lancets Check 3 times daily. E11.9 (Patient not taking: Reported on 01/15/2018)  . [DISCONTINUED] aspirin 81 MG tablet Take 81 mg by mouth daily.   No facility-administered encounter medications on file as of 01/15/2018.     PAST MEDICAL HISTORY:   Past Medical History:  Diagnosis Date  . ABNORMAL THYROID FUNCTION TESTS 01/17/2010  . ALLERGIC RHINITIS 12/01/2008  . Colon cancer ([place])   . COLONIC POLYPS, HX OF 12/01/2008  . DIABETES  MELLITUS, TYPE II 12/01/2008  . EDEMA LEG 05/04/2009  . ESSENTIAL HYPERTENSION 12/01/2008  . Exocrine pancreatic insufficiency   . GALLSTONES 04/07/2009  . GERD 12/01/2008  . Hay fever   . SPONDYLOSIS, LUMBAR 12/01/2008    PAST SURGICAL HISTORY:   Past Surgical History:  Procedure Laterality Date  . ANKLE SURGERY Right   . CATARACT EXTRACTION    . CHOLECYSTECTOMY    . COLON SURGERY     resection 1990 for cancer  . LEG SURGERY Left     SOCIAL HISTORY:   Social History   Socioeconomic History  . Marital status: Widowed    Spouse name: Not on file  . Number of children: 2  . Years of education: Not on file  . Highest education level: Not on file  Occupational History  . 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 Financial resource strain: Not on file  . Food insecurity:    Worry: Not on file    Inability: Not on file  . Transportation needs:    Medical: Not on file    Non-medical: Not on file  Tobacco Use  . Smoking status: Former Smoker    Packs/day: 1.00    Years: 20.00    Pack years: 20.00    Types: Cigarettes    Last attempt to quit: 12/28/1968    Years since quitting: 49.0  . Smokeless tobacco: Never Used  Substance and Sexual Activity  . Alcohol use: No  . Drug use: No  . Sexual activity: Not on file  Lifestyle  . Physical activity:    Days per week: Not on file    Minutes per session: Not on file  . Stress: Not on file  Relationships  . Social connections:    Talks on phone: Not on file    Gets together: Not on file    Attends religious service: Not on file    Active member of club or organization: Not on file    Attends meetings of clubs or organizations: Not on file    Relationship status: Not on file  . Intimate partner violence:    Fear of current or ex partner: Not on file    Emotionally abused: Not on file    Physically abused: Not on file    Forced sexual activity: Not on file  Other  Topics Concern  . Not on file  Social History Narrative  . Not on file    FAMILY HISTORY:   Family Status  Relation Name Status  . Other  (Not Specified)  . Mother  Deceased  . Father  Deceased  . Sister  Deceased  . Brother x3 Alive  . Son x2 Alive    ROS:  A complete 10 system review of systems was  obtained and was unremarkable apart from what is mentioned above.  PHYSICAL EXAMINATION:    VITALS:   Vitals:   01/15/18 1235  BP: 130/60  Pulse: 98  SpO2: 94%  Weight: 190 lb (86.2 kg)  Height: 5' 8" (1.727 m)    GEN:  The patient appears stated age and is in NAD. HEENT:  Normocephalic, atraumatic.  The mucous membranes are moist. The superficial temporal arteries are without ropiness or tenderness. CV:  RRR Lungs:  CTAB Neck/HEME:  There are no carotid bruits bilaterally.  Neurological examination:  Orientation: The patient is alert and oriented x3. Cranial nerves: There is good facial symmetry. The speech is fluent and clear. Soft palate rises symmetrically and there is no tongue deviation. Hearing is intact to conversational tone. Sensation: Sensation is intact to light touch throughout Motor: Strength is 5/5 in the bilateral upper and lower extremities.   Shoulder shrug is equal and symmetric.  There is no pronator drift.   Movement examination: Tone: There is mild to mod increased tone in the RUE but normal on the L Abnormal movements: There is RUE resting tremor that is overall mild Coordination:  There is decremation, with any form of RAMS, including alternating supination and pronation of the forearm, hand opening and closing, finger taps, heel taps and toe taps on the right Gait and Station: The patient has minimal difficulty arising out of a deep-seated chair without the use of the hands. The patient is flexed at the waist.  His arm swing is good today.  Labs:  Lab Results  Component Value Date   TSH 5.71 (H) 09/24/2017     Chemistry      Component  Value Date/Time   NA 140 04/06/2017 1015   K 4.5 04/06/2017 1015   CL 105 04/06/2017 1015   CO2 29 04/06/2017 1015   BUN 28 (H) 04/06/2017 1015   CREATININE 1.42 04/06/2017 1015      Component Value Date/Time   CALCIUM 9.3 04/06/2017 1015   ALKPHOS 68 04/06/2017 1015   AST 14 04/06/2017 1015   ALT 16 04/06/2017 1015   BILITOT 0.6 04/06/2017 1015       ASSESSMENT/PLAN:  1.  Idiopathic Parkinsons disease.  The patient has tremor, bradykinesia, rigidity and mild postural instability.  -Despite the patient's assertion that the medication does not help, he is much less rigid than he was last visit.  He may have levodopa resistant tremor.  He and I discussed the concept of this today, and I discussed that this does not mean that the medication does not help.  Rigidity is markedly improved, although not gone.  -increase carbidopa/levodopa 25/100, to 2/2/1.  He is also spacing out the medication too far and I asked him to take his medications at 7 aM/11 AM/3 PM to 4 PM.  Risks, benefits, side effects and alternative therapies were discussed.  The opportunity to ask questions was given and they were answered to the best of my ability.  The patient expressed understanding and willingness to follow the outlined treatment protocols.  -refuses physical therapy.    -encouraged exercise but refused.    -Patient asked about DBS therapy.  I really do not think that he is a candidate right now, nor does he really think he wants to be.  However, he asked me several questions about this and asked for patient literature on this.  I got on the Parkinson's foundation website and enrolled him so that he can get the literature at  his home.  2.  Constipation  -discussed increased water intake  3.  Hypothyroidism  -numbers stable over the last year.  Not on med. Advised to continue f/u with PCP  4.  Follow up is anticipated in the next 4 months, sooner should new neurologic issues arise.  Much greater than 50%  of this visit was spent in counseling and coordinating care.  Total face to face time:  25 min    Cc:  [name], MD

## 2018-01-15 ENCOUNTER — Encounter: Payer: Self-pay | Admitting: Neurology

## 2018-01-15 ENCOUNTER — Ambulatory Visit: Payer: PPO | Admitting: Neurology

## 2018-01-15 VITALS — BP 130/60 | HR 98 | Ht 68.0 in | Wt 190.0 lb

## 2018-01-15 DIAGNOSIS — G20 Parkinson's disease: Secondary | ICD-10-CM | POA: Diagnosis not present

## 2018-01-15 MED ORDER — CARBIDOPA-LEVODOPA 25-100 MG PO TABS: ORAL_TABLET | ORAL | 1 refills | Status: DC

## 2018-01-15 MED ORDER — SERTRALINE HCL 50 MG PO TABS: 50.0000 mg | ORAL_TABLET | Freq: Every day | ORAL | 1 refills | Status: DC

## 2018-01-15 NOTE — Patient Instructions (Signed)
Increase carbidopa/levodopa 25/100, 2 tablets at 7am/2 tablets at 11am/1 tablet at 3-4pm   [facility] Parkinson's & Movement Disorders  The [facility] team know that living well with a movement disorder extends far beyond our clinic walls. We are together with you. Our team is passionate about providing resources to you and your loved ones who are living with Parkinson's disease and movement disorders. Participate in these programs and join our community. These resources are free or low cost!   [facility] is adding:   Innovative educational programs for patients and caregivers.   Support groups for patients and caregivers living with Parkinson's disease.   Parkinson's specific exercise programs.   Custom tailored therapeutic programs that will benefit patient's living with Parkinson's disease.   We are in this together. You can help and contribute to grow these programs and resources in our community. 100% of the funds donated to the [facility] stays right here in our community to support patients and their caregivers.  To make a tax deductible contribution:  -ask for a [facility] for Parkinson's envelope in the office today.  - call the Office of Institutional Advancement at [phone].     Registration is OPEN!    Third Annual Parkinson's Education Symposium   To register: [email or web address]      Search:  [employer] person attending individually Questions: [place], [place] or [email or web address]

## 2018-01-24 ENCOUNTER — Ambulatory Visit: Payer: PPO | Admitting: Podiatry

## 2018-02-04 ENCOUNTER — Encounter: Payer: Self-pay | Admitting: Family Medicine

## 2018-02-04 ENCOUNTER — Ambulatory Visit (INDEPENDENT_AMBULATORY_CARE_PROVIDER_SITE_OTHER): Payer: PPO | Admitting: Family Medicine

## 2018-02-04 VITALS — BP 108/58 | HR 74 | Temp 97.9°F | Wt 191.4 lb

## 2018-02-04 DIAGNOSIS — I10 Essential (primary) hypertension: Secondary | ICD-10-CM | POA: Diagnosis not present

## 2018-02-04 DIAGNOSIS — W57XXXA Bitten or stung by nonvenomous insect and other nonvenomous arthropods, initial encounter: Secondary | ICD-10-CM

## 2018-02-04 DIAGNOSIS — E1122 Type 2 diabetes mellitus with diabetic chronic kidney disease: Secondary | ICD-10-CM

## 2018-02-04 DIAGNOSIS — E785 Hyperlipidemia, unspecified: Secondary | ICD-10-CM

## 2018-02-04 DIAGNOSIS — N183 Chronic kidney disease, stage 3 unspecified: Secondary | ICD-10-CM

## 2018-02-04 DIAGNOSIS — E1121 Type 2 diabetes mellitus with diabetic nephropathy: Secondary | ICD-10-CM | POA: Diagnosis not present

## 2018-02-04 DIAGNOSIS — S30861A Insect bite (nonvenomous) of abdominal wall, initial encounter: Secondary | ICD-10-CM

## 2018-02-04 LAB — HEPATIC FUNCTION PANEL
ALK PHOS: 66 U/L (ref 39–117)
ALT: 5 U/L (ref 0–53)
AST: 12 U/L (ref 0–37)
Albumin: 3.7 g/dL (ref 3.5–5.2)
BILIRUBIN TOTAL: 0.5 mg/dL (ref 0.2–1.2)
Bilirubin, Direct: 0.1 mg/dL (ref 0.0–0.3)
Total Protein: 6.6 g/dL (ref 6.0–8.3)

## 2018-02-04 LAB — BASIC METABOLIC PANEL
BUN: 34 mg/dL — AB (ref 6–23)
CALCIUM: 9 mg/dL (ref 8.4–10.5)
CO2: 28 mEq/L (ref 19–32)
CREATININE: 1.54 mg/dL — AB (ref 0.40–1.50)
Chloride: 105 mEq/L (ref 96–112)
GFR: 45.23 mL/min — AB (ref >60.00)
GLUCOSE: 142 mg/dL — AB (ref 70–99)
Potassium: 4.3 mEq/L (ref 3.5–5.1)
Sodium: 140 mEq/L (ref 135–145)

## 2018-02-04 LAB — LIPID PANEL
CHOL/HDL RATIO: 3
Cholesterol: 128 mg/dL (ref 0–200)
HDL: 37 mg/dL — ABNORMAL LOW (ref >39.00)
LDL Cholesterol: 76 mg/dL (ref 0–99)
NonHDL: 91.4
Triglycerides: 75 mg/dL (ref 0.0–149.0)
VLDL: 15 mg/dL (ref 0.0–40.0)

## 2018-02-04 LAB — POCT GLYCOSYLATED HEMOGLOBIN (HGB A1C): Hemoglobin A1C: 6

## 2018-02-04 NOTE — Progress Notes (Signed)
Subjective:     Patient ID: Steven Ward, male   DOB: June 11, 1925, 84 y.o.   MRN: [id number]  HPI Seen for several issues as follows  Type 2 diabetes. History of excellent control. Not monitoring blood sugars regularly. We reduced his glyburide dose last visit to 2.5 mg. No hypoglycemia. Remains on extended release metformin.  Hypertension. No recent dizziness. He is now on Sinemet for Parkinson's but has not had any consistent orthostatic type symptoms.  Ran out of sertraline several weeks ago. Was taking this for depression. He's not seen any change in symptoms since stopping this. He feels his depression is stable this time  Pruritic area right waist. He noted recently what he thought was a skin lesion/mole or scab. No fever. No headaches.  Past Medical History:  Diagnosis Date  . ABNORMAL THYROID FUNCTION TESTS 01/17/2010  . ALLERGIC RHINITIS 12/01/2008  . Colon cancer ([place])   . COLONIC POLYPS, HX OF 12/01/2008  . DIABETES MELLITUS, TYPE II 12/01/2008  . EDEMA LEG 05/04/2009  . ESSENTIAL HYPERTENSION 12/01/2008  . Exocrine pancreatic insufficiency   . GALLSTONES 04/07/2009  . GERD 12/01/2008  . Hay fever   . SPONDYLOSIS, LUMBAR 12/01/2008   Past Surgical History:  Procedure Laterality Date  . ANKLE SURGERY Right   . CATARACT EXTRACTION    . CHOLECYSTECTOMY    . COLON SURGERY     resection 1990 for cancer  . LEG SURGERY Left     reports that he quit smoking about 49 years ago. His smoking use included cigarettes. He has a 20.00 pack-year smoking history. He has never used smokeless tobacco. He reports that he does not drink alcohol or use drugs. family history includes Cancer in his mother and other; Diabetes in his other; Stroke in his other. Allergies  Allergen Reactions  . Amoxicillin     REACTION: rash, dizziness     Review of Systems  Constitutional: Negative for chills, fatigue and fever.  Eyes: Negative for visual disturbance.  Respiratory: Negative for cough,  chest tightness and shortness of breath.   Cardiovascular: Negative for chest pain, palpitations and leg swelling.  Endocrine: Negative for polydipsia and polyuria.  Neurological: Negative for dizziness, syncope, weakness, light-headedness and headaches.       Objective:   Physical Exam  Constitutional: He is oriented to person, place, and time. He appears well-developed and well-nourished.  HENT:  Right Ear: External ear normal.  Left Ear: External ear normal.  Mouth/Throat: Oropharynx is clear and moist.  Eyes: Pupils are equal, round, and reactive to light.  Neck: Neck supple. No thyromegaly present.  Cardiovascular: Normal rate and regular rhythm.  Pulmonary/Chest: Effort normal and breath sounds normal. No respiratory distress. He has no wheezes. He has no rales.  Musculoskeletal: He exhibits no edema.  Neurological: He is alert and oriented to person, place, and time.  Skin:  Patient has fairly large tick bitten in to his right lower abdominal region       Assessment:     -#1 type 2 diabetes well controlled A1c 6.0%  -#2 hypertension stable and at goal  #3 dyslipidemia  #4 tick bite right lower abdominal region Plan:     -We were able to use some fine tweezers and removed the tick entirety and reviewed signs symptoms of tick-related fevers and [facility] spotted fever to watch out for -Discontinue DiaBeta this time since his A1c is 6.0% and would like to avoid high risk of hypoglycemia given his age. -Recheck further  labs with lipid panel, hepatic panel, basic metabolic panel -Routine follow-up in 3-4 months and sooner as needed  [name] MD [facility]

## 2018-02-04 NOTE — Patient Instructions (Addendum)
Stop the Glyburide  Stop the Sertraline (Zoloft)

## 2018-02-08 ENCOUNTER — Other Ambulatory Visit: Payer: Self-pay | Admitting: Family Medicine

## 2018-02-08 DIAGNOSIS — I10 Essential (primary) hypertension: Secondary | ICD-10-CM

## 2018-02-08 MED ORDER — BENAZEPRIL HCL 10 MG PO TABS: 10.0000 mg | ORAL_TABLET | Freq: Every day | ORAL | 1 refills | Status: DC

## 2018-02-25 ENCOUNTER — Other Ambulatory Visit (INDEPENDENT_AMBULATORY_CARE_PROVIDER_SITE_OTHER): Payer: PPO

## 2018-02-25 DIAGNOSIS — I10 Essential (primary) hypertension: Secondary | ICD-10-CM

## 2018-02-25 LAB — BASIC METABOLIC PANEL
BUN: 27 mg/dL — AB (ref 6–23)
CHLORIDE: 106 meq/L (ref 96–112)
CO2: 27 meq/L (ref 19–32)
CREATININE: 1.43 mg/dL (ref 0.40–1.50)
Calcium: 9.1 mg/dL (ref 8.4–10.5)
GFR: 49.26 mL/min — ABNORMAL LOW (ref >60.00)
GLUCOSE: 107 mg/dL — AB (ref 70–99)
Potassium: 4.4 mEq/L (ref 3.5–5.1)
Sodium: 141 mEq/L (ref 135–145)

## 2018-03-18 ENCOUNTER — Telehealth: Payer: Self-pay | Admitting: Family Medicine

## 2018-03-18 NOTE — Telephone Encounter (Signed)
Copied from [facility] [id number]. Topic: Quick Communication - Rx Refill/Question >> Mar 18, 2018  1:52 PM Steven Ward wrote: Medication:   Sertraline  (90day supply)  Pt went off but is saying he does need to take this and asking if can get prescription again Please call pt. When sent in   Has the patient contacted their pharmacy? No. (Agent: If no, request that the patient contact the pharmacy for the refill.) (Agent: If yes, when and what did the pharmacy advise?)  Preferred Pharmacy (with phone number or street name):   [facility], [place] [address] Phone: [phone] Fax: [phone]    Agent: Please be advised that RX refills may take up to 3 business days. We ask that you follow-up with your pharmacy.

## 2018-03-19 ENCOUNTER — Other Ambulatory Visit: Payer: Self-pay

## 2018-03-19 MED ORDER — SERTRALINE HCL 50 MG PO TABS: ORAL_TABLET | ORAL | 0 refills | Status: DC

## 2018-03-19 NOTE — Telephone Encounter (Signed)
Filled for qty 90 with 1 rf on 01/15/18, discontinued on 5/ 20/19  Please advise.

## 2018-03-19 NOTE — Telephone Encounter (Signed)
May refill Sertraline 50 mg.  Would start off one half tablet daily for 3-4 days and then increase to one daily.

## 2018-03-19 NOTE — Telephone Encounter (Signed)
Patient would like to restart the anti depressant. Please send in 90 day supply.

## 2018-03-19 NOTE — Telephone Encounter (Signed)
Rx has been sent to [facility]

## 2018-03-20 ENCOUNTER — Ambulatory Visit: Payer: PPO | Admitting: Podiatry

## 2018-03-20 ENCOUNTER — Encounter: Payer: Self-pay | Admitting: Podiatry

## 2018-03-20 DIAGNOSIS — B351 Tinea unguium: Secondary | ICD-10-CM

## 2018-03-20 DIAGNOSIS — M79676 Pain in unspecified toe(s): Secondary | ICD-10-CM | POA: Diagnosis not present

## 2018-03-20 DIAGNOSIS — E119 Type 2 diabetes mellitus without complications: Secondary | ICD-10-CM | POA: Diagnosis not present

## 2018-03-20 NOTE — Progress Notes (Signed)
Patient ID: Steven Ward, male   DOB: 10/14/1924, 84 y.o.   MRN: [id number] Complaint:  Visit Type: Patient returns to my office for continued preventative foot care services. Complaint: Patient states" my nails have grown long and thick and become painful to walk and wear shoes" Patient has been diagnosed with DM with no  foot complications. He presents for preventative foot care services. No changes to ROS  Podiatric Exam: Vascular: dorsalis pedis and posterior tibial pulses are palpable bilateral. Capillary return is immediate. Temperature gradient is WNL. Skin turgor WNL  Sensorium: Normal Semmes Weinstein monofilament test. Normal tactile sensation bilaterally. Nail Exam: Pt has thick disfigured discolored nails with subungual debris noted bilateral entire nail hallux through fifth toenails Ulcer Exam: There is no evidence of ulcer or pre-ulcerative changes or infection. Orthopedic Exam: Muscle tone and strength are WNL. No limitations in general ROM. No crepitus or effusions noted. Foot type and digits show no abnormalities. Bony prominences are unremarkable. Skin: No Porokeratosis. No infection or ulcers  Diagnosis:  Tinea unguium, Pain in right toe, pain in left toes  Treatment & Plan Procedures and Treatment: Consent by patient was obtained for treatment procedures. The patient understood the discussion of treatment and procedures well. All questions were answered thoroughly reviewed. Debridement of mycotic and hypertrophic toenails, 1 through 5 bilateral and clearing of subungual debris. No ulceration, no infection noted. Return Visit-Office Procedure: Patient instructed to return to the office for a follow up visit 10 weeks  for continued evaluation and treatment.     Misty Rago DPM 

## 2018-03-22 ENCOUNTER — Ambulatory Visit (INDEPENDENT_AMBULATORY_CARE_PROVIDER_SITE_OTHER): Payer: PPO | Admitting: Family Medicine

## 2018-03-22 ENCOUNTER — Encounter: Payer: Self-pay | Admitting: Family Medicine

## 2018-03-22 VITALS — BP 94/56 | Temp 97.9°F | Wt 193.0 lb

## 2018-03-22 DIAGNOSIS — I959 Hypotension, unspecified: Secondary | ICD-10-CM | POA: Diagnosis not present

## 2018-03-22 DIAGNOSIS — R42 Dizziness and giddiness: Secondary | ICD-10-CM | POA: Diagnosis not present

## 2018-03-22 DIAGNOSIS — F329 Major depressive disorder, single episode, unspecified: Secondary | ICD-10-CM

## 2018-03-22 DIAGNOSIS — I10 Essential (primary) hypertension: Secondary | ICD-10-CM | POA: Diagnosis not present

## 2018-03-22 MED ORDER — SERTRALINE HCL 50 MG PO TABS: ORAL_TABLET | ORAL | 2 refills | Status: DC

## 2018-03-22 NOTE — Progress Notes (Signed)
  Subjective:     Patient ID: Steven Ward, male   DOB: 1925-08-28, 84 y.o.   MRN: [id number]  HPI Patient seen for the following  Increased depression symptoms recently. We had him on Zoloft several months ago he felt better but took himself off. He's had some relapse since then. No suicidal ideation. He has some occasional agitation and irritability. Depressed mood. He called recently requesting refill of sertraline. This was called in a couple days ago - but he apparently was not aware.  He had some recent lightheadedness. No syncope. He has Parkinson's disease and is now on Sinemet. He is on several other medicines that could lower blood pressure including amlodipine, benazepril, HCTZ, and Flomax. We had recently reduced his HCTZ dosage. Does not have consistent lightheadedness with standing.  Past Medical History:  Diagnosis Date  . ABNORMAL THYROID FUNCTION TESTS 01/17/2010  . ALLERGIC RHINITIS 12/01/2008  . Colon cancer ([place])   . COLONIC POLYPS, HX OF 12/01/2008  . DIABETES MELLITUS, TYPE II 12/01/2008  . EDEMA LEG 05/04/2009  . ESSENTIAL HYPERTENSION 12/01/2008  . Exocrine pancreatic insufficiency   . GALLSTONES 04/07/2009  . GERD 12/01/2008  . Hay fever   . SPONDYLOSIS, LUMBAR 12/01/2008   Past Surgical History:  Procedure Laterality Date  . ANKLE SURGERY Right   . CATARACT EXTRACTION    . CHOLECYSTECTOMY    . COLON SURGERY     resection 1990 for cancer  . LEG SURGERY Left     reports that he quit smoking about 49 years ago. His smoking use included cigarettes. He has a 20.00 pack-year smoking history. He has never used smokeless tobacco. He reports that he does not drink alcohol or use drugs. family history includes Cancer in his mother and other; Diabetes in his other; Stroke in his other. Allergies  Allergen Reactions  . Amoxicillin     REACTION: rash, dizziness     Review of Systems  Constitutional: Positive for fatigue.  Eyes: Negative for visual disturbance.   Respiratory: Negative for cough, chest tightness and shortness of breath.   Cardiovascular: Negative for chest pain, palpitations and leg swelling.  Neurological: Positive for light-headedness. Negative for syncope, weakness and headaches.  Psychiatric/Behavioral: Positive for dysphoric mood. Negative for suicidal ideas.       Objective:   Physical Exam  Constitutional: He is oriented to person, place, and time. He appears well-developed and well-nourished.  Cardiovascular: Normal rate and regular rhythm.  Pulmonary/Chest: Effort normal and breath sounds normal.  Musculoskeletal:  Trace edema legs bilaterally  Neurological: He is alert and oriented to person, place, and time. No cranial nerve deficit.  Psychiatric: He has a normal mood and affect. His behavior is normal. Thought content normal.       Assessment:     #1 depressed mood. Patient requesting going back on sertraline.  No suicidal ideation  #2 low blood pressure. He has had some lightheadedness. Standing blood pressure repeated after rest 105/60     Plan:     -Refill sertraline 50 mg daily. We'll have him start half tablet daily for 3-4 days and then increase to 50 mg once daily and reassess in one month -Hold amlodipine for now. Reassess at follow-up and continue to scale back blood pressure medications if he continues to have lightheadedness and low blood pressure  [name] MD [facility] at [facility]

## 2018-03-22 NOTE — Patient Instructions (Signed)
STOP the Amlodipine for now.  Keep all other blood pressure medications the same.

## 2018-04-22 ENCOUNTER — Encounter: Payer: Self-pay | Admitting: Family Medicine

## 2018-04-22 ENCOUNTER — Ambulatory Visit (INDEPENDENT_AMBULATORY_CARE_PROVIDER_SITE_OTHER): Payer: PPO | Admitting: Family Medicine

## 2018-04-22 VITALS — BP 118/60 | HR 66 | Temp 97.4°F | Wt 188.7 lb

## 2018-04-22 DIAGNOSIS — I10 Essential (primary) hypertension: Secondary | ICD-10-CM | POA: Diagnosis not present

## 2018-04-22 DIAGNOSIS — E1121 Type 2 diabetes mellitus with diabetic nephropathy: Secondary | ICD-10-CM | POA: Diagnosis not present

## 2018-04-22 DIAGNOSIS — F329 Major depressive disorder, single episode, unspecified: Secondary | ICD-10-CM

## 2018-04-22 NOTE — Progress Notes (Signed)
Subjective:     Patient ID: Steven Ward, male   DOB: 03/04/1925, 84 y.o.   MRN: [id number]  HPI Patient is seen for follow-up regarding several issues as follows  Recent lightheadedness and dizziness. We had low blood pressure of 94/56 last visit. We had him hold amlodipine and his dizziness has improved. Patient also on his own reduced his Sinemet to just 2 tablets daily. He had been taking 5 tablets daily. He has not notified his neurologist yet. He has follow-up with neurology in September.  Type 2 diabetes. History of good control. A1c 6.0% May. We had him stop glimepiride although he is apparently he is still taking this occasionally. He remains on metformin.  Recent increased depressed mood. He has done well in the past on sertraline. We started back low-dose sertraline last visit and is currently taking 50 mg daily. His feels his depressed mood is improved. No suicidal ideation. Denies side effects from medication.  He has chronic low back pain from lumbar stenosis which limits his activities  Past Medical History:  Diagnosis Date  . ABNORMAL THYROID FUNCTION TESTS 01/17/2010  . ALLERGIC RHINITIS 12/01/2008  . Colon cancer ([place])   . COLONIC POLYPS, HX OF 12/01/2008  . DIABETES MELLITUS, TYPE II 12/01/2008  . EDEMA LEG 05/04/2009  . ESSENTIAL HYPERTENSION 12/01/2008  . Exocrine pancreatic insufficiency   . GALLSTONES 04/07/2009  . GERD 12/01/2008  . Hay fever   . SPONDYLOSIS, LUMBAR 12/01/2008   Past Surgical History:  Procedure Laterality Date  . ANKLE SURGERY Right   . CATARACT EXTRACTION    . CHOLECYSTECTOMY    . COLON SURGERY     resection 1990 for cancer  . LEG SURGERY Left     reports that he quit smoking about 49 years ago. His smoking use included cigarettes. He has a 20.00 pack-year smoking history. He has never used smokeless tobacco. He reports that he does not drink alcohol or use drugs. family history includes Cancer in his mother and other; Diabetes in his  other; Stroke in his other. Allergies  Allergen Reactions  . Amoxicillin     REACTION: rash, dizziness     Review of Systems  Constitutional: Negative for fatigue.  Eyes: Negative for visual disturbance.  Respiratory: Negative for cough, chest tightness and shortness of breath.   Cardiovascular: Negative for chest pain, palpitations and leg swelling.  Genitourinary: Negative for dysuria.  Musculoskeletal: Positive for back pain.  Neurological: Negative for dizziness, syncope, weakness, light-headedness and headaches.  Psychiatric/Behavioral: Negative for dysphoric mood and suicidal ideas.       Objective:   Physical Exam  Constitutional: He is oriented to person, place, and time. He appears well-developed and well-nourished.  HENT:  Right Ear: External ear normal.  Left Ear: External ear normal.  Mouth/Throat: Oropharynx is clear and moist.  Eyes: Pupils are equal, round, and reactive to light.  Neck: Neck supple. No thyromegaly present.  Cardiovascular: Normal rate and regular rhythm.  Pulmonary/Chest: Effort normal and breath sounds normal. No respiratory distress. He has no wheezes. He has no rales.  Musculoskeletal: He exhibits edema.  Trace edema lower legs bilaterally  Neurological: He is alert and oriented to person, place, and time.  Psychiatric:  PHQ-9=3       Assessment:     #1 hypertension stable and at goal. He had some recent hypotensive symptoms improved with discontinuation of amlodipine  #2 recurrent depression currently stable on sertraline. PH Q-9 score 3  #3 type 2 diabetes  well controlled. We have again recommended he discontinue glimepiride to avoid hypoglycemia    Plan:     -Continue current medications -Patient has follow-up in September and will recheck A1c then -Change positions slowly  [name] MD [facility]

## 2018-04-24 ENCOUNTER — Other Ambulatory Visit: Payer: Self-pay | Admitting: Family Medicine

## 2018-05-29 NOTE — Progress Notes (Deleted)
Steven Ward was seen today in the movement disorders clinic for neurologic consultation at the request of Burchette, Alinda Sierras, MD.  The consultation is for the evaluation of tremor.  Tremor: Yes.     How long has it been going on? One year  At rest or with activation?  At rest  Fam hx of tremor?  Yes.    Located where?  RUE only  Affected by caffeine:  No.  Affected by alcohol:  Doesn't drink EtOH  Affected by stress:  unknown  Other specific sxs: Voice: a little softer Sleep: sleeps well  Vivid Dreams:  Yes.    Acting out dreams:  No. Wet Pillows: No. Postural symptoms:  Yes.    Falls?  Yes.  , one time, 3-4 months ago, didn't get hurt Bradykinesia symptoms: difficulty getting out of a chair Loss of smell:  Yes.   Loss of taste:  Yes.   Urinary Incontinence:  No. Difficulty Swallowing:  No. Handwriting, micrographia: Yes.   Trouble with ADL's:  Yes.    Trouble buttoning clothing: Yes.  , "my fingers ache" Depression:  No. Memory changes:  minimal Hallucinations:  No.  visual distortions: No. N/V:  No. Lightheaded:  No.  Syncope: No. Diplopia:  No.   01/15/18 update: Patient follows up today.  Records have been reviewed since our last visit.  Patient was diagnosed with Parkinson's disease last visit and started on carbidopa/levodopa 25/100, 1 tablet 3 times a day.  Patient states today that medication is not helping.  Taking medication at 7am/11-12pm/7-8pm.   He states "the medication didn't do nothing but make it worse and my neck hurts."   Patient did have lab work for me since last visit.  His TSH is elevated.  He was advised to follow-up with his primary care physician, which he has done.  meds not added/changed.  05/30/18 update: Patient is seen today in follow-up.  His levodopa was increased last visit so that he is supposed to be taking carbidopa/levodopa 25/100, 2 tablets in the morning, 2 in the afternoon and 1 in the evening. ***However, primary care records  from April 22, 2018 indicate that the patient had dropped levodopa back to 2 tablets daily.  He currently takes his medication at ***.  He has had no falls.  No hallucinations.  No lightheadedness or near syncope.    PREVIOUS MEDICATIONS: none to date  ALLERGIES:   Allergies  Allergen Reactions  . Amoxicillin     REACTION: rash, dizziness    CURRENT MEDICATIONS:  Outpatient Encounter Medications as of 05/30/2018  Medication Sig  . benazepril (LOTENSIN) 10 MG tablet Take 1 tablet (10 mg total) by mouth daily.  . blood glucose meter kit and supplies KIT Dispense based on patient and insurance preference. Use up to four times daily as directed. (FOR ICD-9 250.00, 250.01).  . Blood Glucose Monitoring Suppl (ONE TOUCH ULTRA 2) W/DEVICE KIT Use as directed.  . carbidopa-levodopa (SINEMET IR) 25-100 MG tablet 2 in the AM, 2 in the afternoon and 1 in the evening  . glucose blood (ONE TOUCH ULTRA TEST) test strip Test blood sugar 3 times a day  . hydrochlorothiazide (HYDRODIURIL) 25 MG tablet TAKE 1/2 (ONE-HALF) TABLET BY MOUTH ONCE DAILY  . Lancets (ONETOUCH ULTRASOFT) lancets Check 3 times daily. E11.9  . metFORMIN (GLUCOPHAGE-XR) 750 MG 24 hr tablet TAKE 1 TABLET BY MOUTH IN THE MORNING WITH BREAKFAST  . Pancrelipase, Lip-Prot-Amyl, (CREON) 12000 UNITS CPEP Take by mouth  3 (three) times daily before meals.    . polyethylene glycol (MIRALAX / GLYCOLAX) packet Take 17 g by mouth daily as needed for mild constipation.  . sertraline (ZOLOFT) 50 MG tablet Take 1/2 tablet by mouth for 4 days, Then take 1 tablet by mouth daily.  . tamsulosin (FLOMAX) 0.4 MG CAPS capsule TAKE ONE CAPSULE BY MOUTH ONCE DAILY. (Patient taking differently: Take 0.4 mg by mouth daily after supper. )  . triamcinolone cream (KENALOG) 0.1 % APPLY  CREAM EXTERNALLY TO AFFECTED AREA TWICE DAILY AS NEEDED   No facility-administered encounter medications on file as of 05/30/2018.     PAST MEDICAL HISTORY:   Past Medical  History:  Diagnosis Date  . ABNORMAL THYROID FUNCTION TESTS 01/17/2010  . ALLERGIC RHINITIS 12/01/2008  . Colon cancer ([place])   . COLONIC POLYPS, HX OF 12/01/2008  . DIABETES MELLITUS, TYPE II 12/01/2008  . EDEMA LEG 05/04/2009  . ESSENTIAL HYPERTENSION 12/01/2008  . Exocrine pancreatic insufficiency   . GALLSTONES 04/07/2009  . GERD 12/01/2008  . Hay fever   . SPONDYLOSIS, LUMBAR 12/01/2008    PAST SURGICAL HISTORY:   Past Surgical History:  Procedure Laterality Date  . ANKLE SURGERY Right   . CATARACT EXTRACTION    . CHOLECYSTECTOMY    . COLON SURGERY     resection 1990 for cancer  . LEG SURGERY Left     SOCIAL HISTORY:   Social History   Socioeconomic History  . Marital status: Widowed    Spouse name: Not on file  . Number of children: 2  . Years of education: Not on file  . Highest education level: Not on file  Occupational History  . 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 Financial resource strain: Not on file  . Food insecurity:    Worry: Not on file    Inability: Not on file  . Transportation needs:    Medical: Not on file    Non-medical: Not on file  Tobacco Use  . Smoking status: Former Smoker    Packs/day: 1.00    Years: 20.00    Pack years: 20.00    Types: Cigarettes    Last attempt to quit: 12/28/1968    Years since quitting: 49.4  . Smokeless tobacco: Never Used  Substance and Sexual Activity  . Alcohol use: No  . Drug use: No  . Sexual activity: Not on file  Lifestyle  . Physical activity:    Days per week: Not on file    Minutes per session: Not on file  . Stress: Not on file  Relationships  . Social connections:    Talks on phone: Not on file    Gets together: Not on file    Attends religious service: Not on file    Active member of club or organization: Not on file    Attends meetings of clubs or organizations: Not on file    Relationship status: Not on file  . Intimate partner  violence:    Fear of current or ex partner: Not on file    Emotionally abused: Not on file    Physically abused: Not on file    Forced sexual activity: Not on file  Other Topics Concern  . Not on file  Social History Narrative  . Not on file    FAMILY HISTORY:   Family Status  Relation Name Status  . Other  (Not Specified)  .  Mother  Deceased  . Father  Deceased  . Sister  Deceased  . Brother x3 Alive  . Son x2 Alive    ROS:  A complete 10 system review of systems was obtained and was unremarkable apart from what is mentioned above.  PHYSICAL EXAMINATION:    VITALS:   There were no vitals filed for this visit.  GEN:  The patient appears stated age and is in NAD. HEENT:  Normocephalic, atraumatic.  The mucous membranes are moist. The superficial temporal arteries are without ropiness or tenderness. CV:  RRR Lungs:  CTAB Neck/HEME:  There are no carotid bruits bilaterally.  Neurological examination:  Orientation: The patient is alert and oriented x3. Cranial nerves: There is good facial symmetry. The speech is fluent and clear. Soft palate rises symmetrically and there is no tongue deviation. Hearing is intact to conversational tone. Sensation: Sensation is intact to light touch throughout Motor: Strength is 5/5 in the bilateral upper and lower extremities.   Shoulder shrug is equal and symmetric.  There is no pronator drift.   Movement examination: Tone: There is mild to mod increased tone in the RUE but normal on the L Abnormal movements: There is RUE resting tremor that is overall mild Coordination:  There is decremation, with any form of RAMS, including alternating supination and pronation of the forearm, hand opening and closing, finger taps, heel taps and toe taps on the right Gait and Station: The patient has minimal difficulty arising out of a deep-seated chair without the use of the hands. The patient is flexed at the waist.  His arm swing is good  today.  Labs:  Lab Results  Component Value Date   TSH 5.71 (H) 09/24/2017     Chemistry      Component Value Date/Time   NA 141 02/25/2018 0920   K 4.4 02/25/2018 0920   CL 106 02/25/2018 0920   CO2 27 02/25/2018 0920   BUN 27 (H) 02/25/2018 0920   CREATININE 1.43 02/25/2018 0920      Component Value Date/Time   CALCIUM 9.1 02/25/2018 0920   ALKPHOS 66 02/04/2018 1057   AST 12 02/04/2018 1057   ALT 5 02/04/2018 1057   BILITOT 0.5 02/04/2018 1057       ASSESSMENT/PLAN:  1.  Idiopathic Parkinsons disease.  The patient has tremor, bradykinesia, rigidity and mild postural instability.  -Despite the patient's assertion that the medication does not help, he is much less rigid than he was last visit.  He may have levodopa resistant tremor.  He and I discussed the concept of this today, and I discussed that this does not mean that the medication does not help.  Rigidity is markedly improved, although not gone.  -increase carbidopa/levodopa 25/100, to 2/2/1.  He is also spacing out the medication too far and I asked him to take his medications at 7 aM/11 AM/3 PM to 4 PM.  Risks, benefits, side effects and alternative therapies were discussed.  The opportunity to ask questions was given and they were answered to the best of my ability.  The patient expressed understanding and willingness to follow the outlined treatment protocols.  -refuses physical therapy.    -encouraged exercise but refused.    -Patient asked about DBS therapy.  I really do not think that he is a candidate right now, nor does he really think he wants to be.  However, he asked me several questions about this and asked for patient literature on this.  I got  on the Parkinson's foundation website and enrolled him so that he can get the literature at his home.  2.  Constipation  -discussed increased water intake  3.  Hypothyroidism  -numbers stable over the last year.  Not on med. Advised to continue f/u with PCP  4.  ***    Cc:  Burchette, Alinda Sierras, MD

## 2018-05-30 ENCOUNTER — Ambulatory Visit: Payer: PPO | Admitting: Neurology

## 2018-05-31 ENCOUNTER — Ambulatory Visit: Payer: PPO | Admitting: Podiatry

## 2018-06-03 NOTE — Progress Notes (Signed)
Steven Ward was seen today in the movement disorders clinic for neurologic consultation at the request of Burchette, Alinda Sierras, MD.  The consultation is for the evaluation of tremor.  Tremor: Yes.     How long has it been going on? One year  At rest or with activation?  At rest  Fam hx of tremor?  Yes.    Located where?  RUE only  Affected by caffeine:  No.  Affected by alcohol:  Doesn't drink EtOH  Affected by stress:  unknown  Other specific sxs: Voice: a little softer Sleep: sleeps well  Vivid Dreams:  Yes.    Acting out dreams:  No. Wet Pillows: No. Postural symptoms:  Yes.    Falls?  Yes.  , one time, 3-4 months ago, didn't get hurt Bradykinesia symptoms: difficulty getting out of a chair Loss of smell:  Yes.   Loss of taste:  Yes.   Urinary Incontinence:  No. Difficulty Swallowing:  No. Handwriting, micrographia: Yes.   Trouble with ADL's:  Yes.    Trouble buttoning clothing: Yes.  , "my fingers ache" Depression:  No. Memory changes:  minimal Hallucinations:  No.  visual distortions: No. N/V:  No. Lightheaded:  No.  Syncope: No. Diplopia:  No.   01/15/18 update: Patient follows up today.  Records have been reviewed since our last visit.  Patient was diagnosed with Parkinson's disease last visit and started on carbidopa/levodopa 25/100, 1 tablet 3 times a day.  Patient states today that medication is not helping.  Taking medication at 7am/11-12pm/7-8pm.   He states "the medication didn't do nothing but make it worse and my neck hurts."   Patient did have lab work for me since last visit.  His TSH is elevated.  He was advised to follow-up with his primary care physician, which he has done.  meds not added/changed.  06/04/18 update: Patient is seen today in follow-up. He is accompanied by son who supplements the history.   His levodopa was increased last visit so that he is supposed to be taking carbidopa/levodopa 25/100, 2 tablets in the morning, 2 in the afternoon  and 1 in the evening. However, primary care records from April 22, 2018 indicate that the patient had dropped levodopa back to 2 tablets daily.  He currently is back to taking carbidopa/levodopa, 2 tablets at 12pm (awakens at 6:30 am), 2 at 6pm and 1 at bedtime.  He has had no falls.  No hallucinations.  No lightheadedness or near syncope.    PREVIOUS MEDICATIONS: none to date  ALLERGIES:   Allergies  Allergen Reactions  . Amoxicillin     REACTION: rash, dizziness    CURRENT MEDICATIONS:  Outpatient Encounter Medications as of 06/04/2018  Medication Sig  . benazepril (LOTENSIN) 10 MG tablet Take 1 tablet (10 mg total) by mouth daily.  . blood glucose meter kit and supplies KIT Dispense based on patient and insurance preference. Use up to four times daily as directed. (FOR ICD-9 250.00, 250.01).  . Blood Glucose Monitoring Suppl (ONE TOUCH ULTRA 2) W/DEVICE KIT Use as directed.  . carbidopa-levodopa (SINEMET IR) 25-100 MG tablet 2 in the AM, 2 in the afternoon and 1 in the evening  . glucose blood (ONE TOUCH ULTRA TEST) test strip Test blood sugar 3 times a day  . hydrochlorothiazide (HYDRODIURIL) 25 MG tablet TAKE 1/2 (ONE-HALF) TABLET BY MOUTH ONCE DAILY  . Lancets (ONETOUCH ULTRASOFT) lancets Check 3 times daily. E11.9  . metFORMIN (GLUCOPHAGE-XR) 750  MG 24 hr tablet TAKE 1 TABLET BY MOUTH IN THE MORNING WITH BREAKFAST  . Pancrelipase, Lip-Prot-Amyl, (CREON) 12000 UNITS CPEP Take by mouth 3 (three) times daily before meals.    . polyethylene glycol (MIRALAX / GLYCOLAX) packet Take 17 g by mouth daily as needed for mild constipation.  . sertraline (ZOLOFT) 50 MG tablet Take 1/2 tablet by mouth for 4 days, Then take 1 tablet by mouth daily.  . tamsulosin (FLOMAX) 0.4 MG CAPS capsule TAKE ONE CAPSULE BY MOUTH ONCE DAILY. (Patient taking differently: Take 0.4 mg by mouth daily after supper. )  . triamcinolone cream (KENALOG) 0.1 % APPLY  CREAM EXTERNALLY TO AFFECTED AREA TWICE DAILY AS NEEDED     No facility-administered encounter medications on file as of 06/04/2018.     PAST MEDICAL HISTORY:   Past Medical History:  Diagnosis Date  . ABNORMAL THYROID FUNCTION TESTS 01/17/2010  . ALLERGIC RHINITIS 12/01/2008  . Colon cancer ([place])   . COLONIC POLYPS, HX OF 12/01/2008  . DIABETES MELLITUS, TYPE II 12/01/2008  . EDEMA LEG 05/04/2009  . ESSENTIAL HYPERTENSION 12/01/2008  . Exocrine pancreatic insufficiency   . GALLSTONES 04/07/2009  . GERD 12/01/2008  . Hay fever   . SPONDYLOSIS, LUMBAR 12/01/2008    PAST SURGICAL HISTORY:   Past Surgical History:  Procedure Laterality Date  . ANKLE SURGERY Right   . CATARACT EXTRACTION    . CHOLECYSTECTOMY    . COLON SURGERY     resection 1990 for cancer  . LEG SURGERY Left     SOCIAL HISTORY:   Social History   Socioeconomic History  . Marital status: Widowed    Spouse name: Not on file  . Number of children: 2  . Years of education: Not on file  . Highest education level: Not on file  Occupational History  . 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 Financial resource strain: Not on file  . Food insecurity:    Worry: Not on file    Inability: Not on file  . Transportation needs:    Medical: Not on file    Non-medical: Not on file  Tobacco Use  . Smoking status: Former Smoker    Packs/day: 1.00    Years: 20.00    Pack years: 20.00    Types: Cigarettes    Last attempt to quit: 12/28/1968    Years since quitting: 49.4  . Smokeless tobacco: Never Used  Substance and Sexual Activity  . Alcohol use: No  . Drug use: No  . Sexual activity: Not on file  Lifestyle  . Physical activity:    Days per week: Not on file    Minutes per session: Not on file  . Stress: Not on file  Relationships  . Social connections:    Talks on phone: Not on file    Gets together: Not on file    Attends religious service: Not on file    Active member of club or organization: Not on file     Attends meetings of clubs or organizations: Not on file    Relationship status: Not on file  . Intimate partner violence:    Fear of current or ex partner: Not on file    Emotionally abused: Not on file    Physically abused: Not on file    Forced sexual activity: Not on file  Other Topics Concern  . Not on file  Social History  Narrative  . Not on file    FAMILY HISTORY:   Family Status  Relation Name Status  . Other  (Not Specified)  . Mother  Deceased  . Father  Deceased  . Sister  Deceased  . Brother x3 Alive  . Son x2 Alive    ROS:  Review of Systems  Constitutional: Negative.   HENT: Negative.   Eyes: Negative.   Cardiovascular: Negative.   Gastrointestinal: Positive for constipation.  Genitourinary: Negative.   Musculoskeletal: Positive for back pain.  Skin: Negative.     PHYSICAL EXAMINATION:    VITALS:   Vitals:   06/04/18 1248  BP: 138/60  Pulse: 74  SpO2: 92%  Weight: 189 lb (85.7 kg)  Height: '5\' 8"'$  (1.727 m)    GEN:  The patient appears stated age and is in NAD. HEENT:  Normocephalic, atraumatic.  The mucous membranes are moist. The superficial temporal arteries are without ropiness or tenderness. CV:  RRR Lungs:  CTAB Neck/HEME:  There are no carotid bruits bilaterally.  Neurological examination:  Orientation: The patient is alert and oriented x3. Cranial nerves: There is good facial symmetry. The speech is fluent and clear. Soft palate rises symmetrically and there is no tongue deviation. Hearing is intact to conversational tone. Sensation: Sensation is intact to light touch throughout Motor: Strength is 5/5 in the bilateral upper and lower extremities.   Shoulder shrug is equal and symmetric.  There is no pronator drift.   Movement examination: Tone: There is very mild increased tone in the RUE  Abnormal movements: There is no tremor noted today Coordination:  There is decremation with hand opening and closing and finger taps on the  right Gait and Station: The patient has minimal difficulty arising out of a deep-seated chair without the use of the hands. The patient is flexed at the waist.  His arm swing is good today.  Labs:  Lab Results  Component Value Date   TSH 5.71 (H) 09/24/2017     Chemistry      Component Value Date/Time   NA 141 02/25/2018 0920   K 4.4 02/25/2018 0920   CL 106 02/25/2018 0920   CO2 27 02/25/2018 0920   BUN 27 (H) 02/25/2018 0920   CREATININE 1.43 02/25/2018 0920      Component Value Date/Time   CALCIUM 9.1 02/25/2018 0920   ALKPHOS 66 02/04/2018 1057   AST 12 02/04/2018 1057   ALT 5 02/04/2018 1057   BILITOT 0.5 02/04/2018 1057       ASSESSMENT/PLAN:  1.  Idiopathic Parkinsons disease.  The patient has tremor, bradykinesia, rigidity and mild postural instability.  -Despite the patient's assertion that the medication does not help, he is much less rigid than before starting medication.  -talked about about proper taking of carbidopa/levodopa 25/100, 2/2/1.  He is spacing out the medication too far and I asked him to take his medications at 7 aM/11 AM/ 4 PM.  Currently taking first one at noon and last at bedtime.  Discussed this last visit as well. Risks, benefits, side effects and alternative therapies were discussed.  The opportunity to ask questions was given and they were answered to the best of my ability.  The patient expressed understanding and willingness to follow the outlined treatment protocols.  -encouraged exercise.  He is resistant.  Son/pt given resources today  -asks me again about DBS therapy.  Talked about it but don't think that he needs it and likely never will given age and  mild disease  2.  Constipation  -discussed increased water intake.  Discussed this last visit as well  3.  Follow up is anticipated in the next 6 months, sooner should new neurologic issues arise.    Cc:  [name], MD

## 2018-06-04 ENCOUNTER — Encounter: Payer: Self-pay | Admitting: Neurology

## 2018-06-04 ENCOUNTER — Ambulatory Visit: Payer: PPO | Admitting: Neurology

## 2018-06-04 VITALS — BP 138/60 | HR 74 | Ht 68.0 in | Wt 189.0 lb

## 2018-06-04 DIAGNOSIS — G20 Parkinson's disease: Secondary | ICD-10-CM

## 2018-06-04 DIAGNOSIS — K5901 Slow transit constipation: Secondary | ICD-10-CM

## 2018-06-04 NOTE — Patient Instructions (Signed)
 Community Parkinson's Exercise Programs   Parkinson's Wellness Recovery Exercise Programs:   PWR! Moves PD Exercise Class:  This is a therapist-led exercise class for people with Parkinson's disease in the [place] community. It consists of a one-hour exercise class each week. Classes are offered in eight-week sessions, and the cost per session is $80. Class size is limited to a maximum of 20 participants. Participant criteria includes: Participant must be able to get up and down from the floor with minimal to no assistance, have had 0-1 falls in the past 6 months, and have completed physical or occupational therapy at [facility] within the past year.  To find out more about session dates, questions, or to register, please contact Amy Marriott, Physical Therapist, or Denise Robertson, Physical Therapist Assistant, at [facility] at [phone].  PWR! Circuit Class:  This is a therapist-led exercise class with intervals of circuit activities incorporating PWR! Moves into functional activities. It consists of one 45-minute exercise class per week. Classes are offered in eight-week sessions, and the cost per session is $120. Class size is limited to a maximum of eight participants to allow for hands-on instruction. Participant criteria: class is ideal for people with Parkinson's disease who have completed PWR! Moves Exercise Class or who are currently independently exercising and want to be challenged, must be able to walk independently with 0-1 falls in the past 6 months, able to get up and down from the floor independently, able to sit to stand independently, and able to jog 20 feet.   To find out more about session dates, questions, or to register, please contact Amy Marriott, Physical Therapist, or Denise Robertson, Physical Therapist Assistant, at [facility] at [phone].   [employer] Parkinson's Cycle:    Parkinson's Cycle Class at [facility] This is an ongoing class on Monday and Thursday mornings at 10:45 a.m. A healthcare provider referral is required to enroll. This class is FREE to participants, and you do not have to be a member of the [facility] to enroll. Contact Beth at [phone] or [email or web address]. Parkinson's Cycle Class at [facility] Ongoing Class Monday, Wednesday, and Friday mornings at 9:00 a.m. A healthcare provider referral is required to enroll. This class is FREE to participants, and you do not have to be a member of the [facility] to enroll. Contact Marlee at [phone] or [email or web address]. Parkinson's Cycle Class at [facility] Ongoing Class every Friday mornings at 12 p.m.  A healthcare provider referral is required to enroll. This class is FREE to participants, and you do not have to be a member of the [facility] to enroll. Contact [phone].  Parkinson's Cycle Class at [facility] Ongoing Class every Monday at 12pm.  A healthcare provider referral is required to enroll. This class is FREE to participants, and you do not have to be a member of the [facility] to enroll. Contact Julie at [phone] or  [email or web address].   Rock [employer] Boxing:  [employer] [place]  Classes are offered Mondays at 5:15 p.m. and Tuesdays and Thursdays at 12 p.m. at [facility] [employer] [facility]. For more information, contact [phone] or visit [email or web address] or [email or web address]. [employer] [place] Classes are offered Monday, Wednesday, and Friday from 9:30 a.m. - 11:00 a.m. For more information, contact [phone] or [phone] or email [email or web address] or visit [email or web address] or [email or web address]/. [employer] [place] (classes are offered at 2 locations) . [employer] in [place] (for more information,   contact Thad Stovall at  [phone] or email [email or web address] . [place] [facility] (class is open to the public -- for more information, contact Michael Cain at [phone] or email [email or web address]) [employer] [place] Classes are held at [facility] in [place], [place]. For more information, call Dr. Laura Beck at [phone] or [email or web address].   Personal Training for Parkinson's:   [employer] Offers certified personal training to customize a program to meet your exercise needs to address Parkinson's disease. For more information, contact [phone] or visit [email or web address].  Community Dance for Parkinson's:   Community dance class for people with Parkinson's Disease Wednesdays at 9 a.m. [employer] [address] [place], [place] [address] Please contact Christina Soriano [phone] for more information  Scholarships Available for Fitness Programs:  The [employer] for Parkinston's is a non-profit 501(C)3 organization run by volunteers, whose mission is to strive to empower those living with Parkinson's Disease (PD), Progressive Supra-Nuclear Palsy (PSP) and Multiple System Atrophy (MSA).  Through financial support, recipients benefit from individual and group programs. [phone] [email or web address]  

## 2018-06-10 ENCOUNTER — Encounter: Payer: Self-pay | Admitting: Family Medicine

## 2018-06-10 ENCOUNTER — Other Ambulatory Visit: Payer: Self-pay

## 2018-06-10 ENCOUNTER — Ambulatory Visit (INDEPENDENT_AMBULATORY_CARE_PROVIDER_SITE_OTHER): Payer: PPO | Admitting: Family Medicine

## 2018-06-10 VITALS — BP 132/70 | HR 63 | Temp 98.1°F | Ht 68.0 in | Wt 190.1 lb

## 2018-06-10 DIAGNOSIS — E1121 Type 2 diabetes mellitus with diabetic nephropathy: Secondary | ICD-10-CM

## 2018-06-10 DIAGNOSIS — E1122 Type 2 diabetes mellitus with diabetic chronic kidney disease: Secondary | ICD-10-CM

## 2018-06-10 DIAGNOSIS — N183 Chronic kidney disease, stage 3 unspecified: Secondary | ICD-10-CM

## 2018-06-10 DIAGNOSIS — I10 Essential (primary) hypertension: Secondary | ICD-10-CM | POA: Diagnosis not present

## 2018-06-10 LAB — POCT GLYCOSYLATED HEMOGLOBIN (HGB A1C): HEMOGLOBIN A1C: 5.9 % — AB (ref 4.0–5.6)

## 2018-06-10 NOTE — Progress Notes (Signed)
  Subjective:     Patient ID: Steven Ward, male   DOB: 09/10/1925, 84 y.o.   MRN: [id number]  HPI   Patient here for medical follow-up. His chronic problems include history of type 2 diabetes, Parkinson's disease, hyperlipidemia, lumbar stenosis, history of recurrent depression, chronic kidney disease.  He had some recent issues with hypotension and dizziness and that is stable at this time. We had discontinued his amlodipine. He remains on benazepril 10 mg daily. No orthostatic symptoms.  He feels his Parkinson's disease is stable and just saw a neurologist. He is back on Sinemet.  Blood sugars been very stable. A1c 5.9% today. We did recently advise that he stop taking his Glucotrol but he apparently is still taking this. Denies recent hypoglycemia symptoms.  Past Medical History:  Diagnosis Date  . ABNORMAL THYROID FUNCTION TESTS 01/17/2010  . ALLERGIC RHINITIS 12/01/2008  . Colon cancer ([place])   . COLONIC POLYPS, HX OF 12/01/2008  . DIABETES MELLITUS, TYPE II 12/01/2008  . EDEMA LEG 05/04/2009  . ESSENTIAL HYPERTENSION 12/01/2008  . Exocrine pancreatic insufficiency   . GALLSTONES 04/07/2009  . GERD 12/01/2008  . Hay fever   . SPONDYLOSIS, LUMBAR 12/01/2008   Past Surgical History:  Procedure Laterality Date  . ANKLE SURGERY Right   . CATARACT EXTRACTION    . CHOLECYSTECTOMY    . COLON SURGERY     resection 1990 for cancer  . LEG SURGERY Left     reports that he quit smoking about 49 years ago. His smoking use included cigarettes. He has a 20.00 pack-year smoking history. He has never used smokeless tobacco. He reports that he does not drink alcohol or use drugs. family history includes Cancer in his mother and other; Diabetes in his other; Stroke in his other. Allergies  Allergen Reactions  . Amoxicillin     REACTION: rash, dizziness     Review of Systems  Constitutional: Negative for fatigue and unexpected weight change.  Eyes: Negative for visual disturbance.   Respiratory: Negative for cough, chest tightness and shortness of breath.   Cardiovascular: Negative for chest pain, palpitations and leg swelling.  Endocrine: Negative for polydipsia and polyuria.  Neurological: Negative for dizziness, syncope, weakness, light-headedness and headaches.       Objective:   Physical Exam  Constitutional: He is oriented to person, place, and time. He appears well-developed and well-nourished.  HENT:  Right Ear: External ear normal.  Left Ear: External ear normal.  Mouth/Throat: Oropharynx is clear and moist.  Eyes: Pupils are equal, round, and reactive to light.  Neck: Neck supple. No thyromegaly present.  Cardiovascular: Normal rate and regular rhythm.  Pulmonary/Chest: Effort normal and breath sounds normal. No respiratory distress. He has no wheezes. He has no rales.  Musculoskeletal: He exhibits no edema.  Neurological: He is alert and oriented to person, place, and time.       Assessment:     #1 type 2 diabetes fairly well controlled with A1c 5.9%  #2 hypertension stable and at goal. Orthostatic symptoms resolved after discontinuation of amlodipine  #3 chronic kidney disease.    Plan:     -we've again advised that he discontinue his glyburide -He plans to get flu vaccine at [facility] -We'll need to monitor renal function fairly closely with his chronic kidney disease and metformin use. Most recent creatinine 1.4 -Routine follow-up in 4 months and recheck basic chemistries then  [name] MD [facility] at [facility]

## 2018-06-10 NOTE — Patient Instructions (Signed)
Remember flu vaccine.

## 2018-06-19 ENCOUNTER — Encounter: Payer: Self-pay | Admitting: Podiatry

## 2018-06-19 ENCOUNTER — Ambulatory Visit: Payer: PPO | Admitting: Podiatry

## 2018-06-19 DIAGNOSIS — M79676 Pain in unspecified toe(s): Secondary | ICD-10-CM | POA: Diagnosis not present

## 2018-06-19 DIAGNOSIS — E119 Type 2 diabetes mellitus without complications: Secondary | ICD-10-CM

## 2018-06-19 DIAGNOSIS — B351 Tinea unguium: Secondary | ICD-10-CM

## 2018-06-19 NOTE — Progress Notes (Signed)
Patient ID: Steven Ward, male   DOB: 01/18/1925, 84 y.o.   MRN: [id number] Complaint:  Visit Type: Patient returns to my office for continued preventative foot care services. Complaint: Patient states" my nails have grown long and thick and become painful to walk and wear shoes" Patient has been diagnosed with DM with no  foot complications. He presents for preventative foot care services. No changes to ROS  Podiatric Exam: Vascular: dorsalis pedis and posterior tibial pulses are palpable bilateral. Capillary return is immediate. Temperature gradient is WNL. Skin turgor WNL  Sensorium: Normal Semmes Weinstein monofilament test. Normal tactile sensation bilaterally. Nail Exam: Pt has thick disfigured discolored nails with subungual debris noted bilateral entire nail hallux through fifth toenails Ulcer Exam: There is no evidence of ulcer or pre-ulcerative changes or infection. Orthopedic Exam: Muscle tone and strength are WNL. No limitations in general ROM. No crepitus or effusions noted. Foot type and digits show no abnormalities. Bony prominences are unremarkable. Skin: No Porokeratosis. No infection or ulcers  Diagnosis:  Tinea unguium, Pain in right toe, pain in left toes  Treatment & Plan Procedures and Treatment: Consent by patient was obtained for treatment procedures. The patient understood the discussion of treatment and procedures well. All questions were answered thoroughly reviewed. Debridement of mycotic and hypertrophic toenails, 1 through 5 bilateral and clearing of subungual debris. No ulceration, no infection noted. Return Visit-Office Procedure: Patient instructed to return to the office for a follow up visit 10 weeks  for continued evaluation and treatment.     Artia Singley DPM 

## 2018-07-09 DIAGNOSIS — H01025 Squamous blepharitis left lower eyelid: Secondary | ICD-10-CM | POA: Diagnosis not present

## 2018-07-09 DIAGNOSIS — H01022 Squamous blepharitis right lower eyelid: Secondary | ICD-10-CM | POA: Diagnosis not present

## 2018-07-09 DIAGNOSIS — H01024 Squamous blepharitis left upper eyelid: Secondary | ICD-10-CM | POA: Diagnosis not present

## 2018-07-09 DIAGNOSIS — H401132 Primary open-angle glaucoma, bilateral, moderate stage: Secondary | ICD-10-CM | POA: Diagnosis not present

## 2018-07-09 DIAGNOSIS — Z961 Presence of intraocular lens: Secondary | ICD-10-CM | POA: Diagnosis not present

## 2018-07-09 DIAGNOSIS — H01021 Squamous blepharitis right upper eyelid: Secondary | ICD-10-CM | POA: Diagnosis not present

## 2018-07-19 ENCOUNTER — Ambulatory Visit (INDEPENDENT_AMBULATORY_CARE_PROVIDER_SITE_OTHER): Payer: PPO | Admitting: Family Medicine

## 2018-07-19 ENCOUNTER — Other Ambulatory Visit: Payer: Self-pay

## 2018-07-19 ENCOUNTER — Encounter: Payer: Self-pay | Admitting: Family Medicine

## 2018-07-19 VITALS — BP 132/76 | HR 79 | Temp 97.5°F | Ht 68.0 in | Wt 191.2 lb

## 2018-07-19 DIAGNOSIS — G20 Parkinson's disease: Secondary | ICD-10-CM

## 2018-07-19 DIAGNOSIS — E1122 Type 2 diabetes mellitus with diabetic chronic kidney disease: Secondary | ICD-10-CM | POA: Diagnosis not present

## 2018-07-19 DIAGNOSIS — N183 Chronic kidney disease, stage 3 (moderate): Secondary | ICD-10-CM | POA: Diagnosis not present

## 2018-07-19 DIAGNOSIS — Z23 Encounter for immunization: Secondary | ICD-10-CM | POA: Diagnosis not present

## 2018-07-19 DIAGNOSIS — E1121 Type 2 diabetes mellitus with diabetic nephropathy: Secondary | ICD-10-CM

## 2018-07-19 NOTE — Progress Notes (Signed)
Subjective:     Patient ID: Steven Ward, male   DOB: [birth year], 84 y.o.   MRN: [id number]  HPI Patient is here to have DMV paperwork filled out.  He has type 2 diabetes which has been very well controlled.  Last A1c 5.9%.  Remains on only metformin for diabetes.  We recently stopped his sulfonylurea because of not wanting to risk hypoglycemia.  He has not had any hypoglycemia during the past year.  No history of syncope.  No major neuropathy issues.  He gets regular eye exams.  No history of retinopathy.  He has chronic kidney disease with stable GFR around 50.  Was diagnosed with Parkinson's disease about a year ago.  This has not impaired his strength or coordination.  He is on Sinemet.  He has good judgment and no major cognitive impairment  Past Medical History:  Diagnosis Date  . ABNORMAL THYROID FUNCTION TESTS 01/17/2010  . ALLERGIC RHINITIS 12/01/2008  . Colon cancer ([place])   . COLONIC POLYPS, HX OF 12/01/2008  . DIABETES MELLITUS, TYPE II 12/01/2008  . EDEMA LEG 05/04/2009  . ESSENTIAL HYPERTENSION 12/01/2008  . Exocrine pancreatic insufficiency   . GALLSTONES 04/07/2009  . GERD 12/01/2008  . Hay fever   . SPONDYLOSIS, LUMBAR 12/01/2008   Past Surgical History:  Procedure Laterality Date  . ANKLE SURGERY Right   . CATARACT EXTRACTION    . CHOLECYSTECTOMY    . COLON SURGERY     resection 1990 for cancer  . LEG SURGERY Left     reports that he quit smoking about 49 years ago. His smoking use included cigarettes. He has a 20.00 pack-year smoking history. He has never used smokeless tobacco. He reports that he does not drink alcohol or use drugs. family history includes Cancer in his mother and other; Diabetes in his other; Stroke in his other. Allergies  Allergen Reactions  . Amoxicillin     REACTION: rash, dizziness     Review of Systems  Constitutional: Negative for chills, fever and unexpected weight change.  Eyes: Negative for visual disturbance.  Respiratory:  Negative for cough, chest tightness and shortness of breath.   Cardiovascular: Negative for chest pain, palpitations and leg swelling.  Genitourinary: Negative for dysuria.  Neurological: Negative for dizziness, seizures, syncope, speech difficulty, weakness, light-headedness and headaches.       Objective:   Physical Exam  Constitutional: He is oriented to person, place, and time. He appears well-developed and well-nourished.  HENT:  Right Ear: External ear normal.  Left Ear: External ear normal.  Mouth/Throat: Oropharynx is clear and moist.  Eyes: Pupils are equal, round, and reactive to light.  Neck: Neck supple. No thyromegaly present.  Cardiovascular: Normal rate and regular rhythm.  Pulmonary/Chest: Effort normal and breath sounds normal. No respiratory distress. He has no wheezes. He has no rales.  Musculoskeletal: He exhibits no edema.  Neurological: He is alert and oriented to person, place, and time. No cranial nerve deficit. Coordination normal.       Assessment:     #1 type 2 diabetes well-controlled.  No concerns for hypoglycemia and basically no significant risks for hypoglycemia with taking only metformin.  Should not pose any contraindications for driving  #3 chronic kidney disease which is stable  #3 Parkinson's disease- stable on Sinemet.      Plan:     -Filled out DMV forms.  We do not see any problems with him driving at this point. -he will keep regular medical follow  up as scheduled.  [name] MD [facility] at [facility]

## 2018-08-01 ENCOUNTER — Other Ambulatory Visit: Payer: Self-pay | Admitting: Neurology

## 2018-08-07 ENCOUNTER — Telehealth: Payer: Self-pay | Admitting: Neurology

## 2018-08-07 NOTE — Telephone Encounter (Signed)
This was sent on 08/01/18.

## 2018-08-07 NOTE — Telephone Encounter (Signed)
Patient need his Carbidopa levodopa called into the [facility] on [address]. He would like a 90 day supply

## 2018-09-04 ENCOUNTER — Encounter (INDEPENDENT_AMBULATORY_CARE_PROVIDER_SITE_OTHER): Payer: Self-pay

## 2018-09-04 ENCOUNTER — Encounter: Payer: Self-pay | Admitting: Podiatry

## 2018-09-04 ENCOUNTER — Ambulatory Visit: Payer: PPO | Admitting: Podiatry

## 2018-09-04 DIAGNOSIS — M79676 Pain in unspecified toe(s): Secondary | ICD-10-CM | POA: Diagnosis not present

## 2018-09-04 DIAGNOSIS — E119 Type 2 diabetes mellitus without complications: Secondary | ICD-10-CM | POA: Diagnosis not present

## 2018-09-04 DIAGNOSIS — B351 Tinea unguium: Secondary | ICD-10-CM | POA: Diagnosis not present

## 2018-09-04 NOTE — Progress Notes (Signed)
Patient ID: Steven Ward, male   DOB: 03/11/1925, 84 y.o.   MRN: [id number] Complaint:  Visit Type: Patient returns to my office for continued preventative foot care services. Complaint: Patient states" my nails have grown long and thick and become painful to walk and wear shoes" Patient has been diagnosed with DM with no  foot complications. He presents for preventative foot care services. No changes to ROS  Podiatric Exam: Vascular: dorsalis pedis and posterior tibial pulses are palpable bilateral. Capillary return is immediate. Temperature gradient is WNL. Skin turgor WNL  Sensorium: Normal Semmes Weinstein monofilament test. Normal tactile sensation bilaterally. Nail Exam: Pt has thick disfigured discolored nails with subungual debris noted bilateral entire nail hallux through fifth toenails Ulcer Exam: There is no evidence of ulcer or pre-ulcerative changes or infection. Orthopedic Exam: Muscle tone and strength are WNL. No limitations in general ROM. No crepitus or effusions noted. Foot type and digits show no abnormalities. Bony prominences are unremarkable. Skin: No Porokeratosis. No infection or ulcers  Diagnosis:  Tinea unguium, Pain in right toe, pain in left toes  Treatment & Plan Procedures and Treatment: Consent by patient was obtained for treatment procedures. The patient understood the discussion of treatment and procedures well. All questions were answered thoroughly reviewed. Debridement of mycotic and hypertrophic toenails, 1 through 5 bilateral and clearing of subungual debris. No ulceration, no infection noted. Return Visit-Office Procedure: Patient instructed to return to the office for a follow up visit 10 weeks  for continued evaluation and treatment.     Keyen Marban DPM 

## 2018-09-27 ENCOUNTER — Ambulatory Visit: Payer: PPO | Admitting: Podiatry

## 2018-10-11 ENCOUNTER — Ambulatory Visit (INDEPENDENT_AMBULATORY_CARE_PROVIDER_SITE_OTHER): Payer: PPO | Admitting: Family Medicine

## 2018-10-11 ENCOUNTER — Other Ambulatory Visit: Payer: Self-pay

## 2018-10-11 ENCOUNTER — Encounter: Payer: Self-pay | Admitting: Family Medicine

## 2018-10-11 VITALS — BP 132/82 | HR 63 | Temp 97.8°F | Ht 68.0 in | Wt 194.8 lb

## 2018-10-11 DIAGNOSIS — E1121 Type 2 diabetes mellitus with diabetic nephropathy: Secondary | ICD-10-CM

## 2018-10-11 DIAGNOSIS — L0231 Cutaneous abscess of buttock: Secondary | ICD-10-CM | POA: Diagnosis not present

## 2018-10-11 DIAGNOSIS — I10 Essential (primary) hypertension: Secondary | ICD-10-CM | POA: Diagnosis not present

## 2018-10-11 LAB — POCT GLYCOSYLATED HEMOGLOBIN (HGB A1C): HEMOGLOBIN A1C: 5.9 % — AB (ref 4.0–5.6)

## 2018-10-11 NOTE — Progress Notes (Signed)
Subjective:     Patient ID: Steven Ward, male   DOB: [birth year], 85 y.o.   MRN: [id number]  HPI Patient here for follow-up diabetes.  Last A1c 5.9%.  Remains on metformin.  He has had some mild weight gain and poor compliance with diet but blood sugars are stable.  He has hypertension treated with benazepril and low-dose HCTZ.  Denies any recent dizziness.  No falls.  No chest pains.  He has some chronic mild peripheral edema which is unchanged  He has sore area right buttock and had some minimal drainage starting about a week ago and has felt somewhat better since then.  No fevers or chills.  Past Medical History:  Diagnosis Date  . ABNORMAL THYROID FUNCTION TESTS 01/17/2010  . ALLERGIC RHINITIS 12/01/2008  . Colon cancer ([place])   . COLONIC POLYPS, HX OF 12/01/2008  . DIABETES MELLITUS, TYPE II 12/01/2008  . EDEMA LEG 05/04/2009  . ESSENTIAL HYPERTENSION 12/01/2008  . Exocrine pancreatic insufficiency   . GALLSTONES 04/07/2009  . GERD 12/01/2008  . Hay fever   . SPONDYLOSIS, LUMBAR 12/01/2008   Past Surgical History:  Procedure Laterality Date  . ANKLE SURGERY Right   . CATARACT EXTRACTION    . CHOLECYSTECTOMY    . COLON SURGERY     resection 1990 for cancer  . LEG SURGERY Left     reports that he quit smoking about 49 years ago. His smoking use included cigarettes. He has a 20.00 pack-year smoking history. He has never used smokeless tobacco. He reports that he does not drink alcohol or use drugs. family history includes Cancer in his mother and another family member; Diabetes in an other family member; Stroke in an other family member. Allergies  Allergen Reactions  . Amoxicillin     REACTION: rash, dizziness     Review of Systems  Constitutional: Negative for chills, fatigue and fever.  Eyes: Negative for visual disturbance.  Respiratory: Negative for cough, chest tightness and shortness of breath.   Cardiovascular: Negative for chest pain, palpitations and leg swelling.   Neurological: Negative for dizziness, syncope, weakness, light-headedness and headaches.       Objective:   Physical Exam Constitutional:      Appearance: He is well-developed.  HENT:     Right Ear: External ear normal.     Left Ear: External ear normal.  Eyes:     Pupils: Pupils are equal, round, and reactive to light.  Neck:     Musculoskeletal: Neck supple.     Thyroid: No thyromegaly.  Cardiovascular:     Rate and Rhythm: Normal rate and regular rhythm.  Pulmonary:     Effort: Pulmonary effort is normal. No respiratory distress.     Breath sounds: Normal breath sounds. No wheezing or rales.  Musculoskeletal:     Comments: He has trace edema lower legs bilaterally  Skin:    Comments: Patient has area approximately 1-1/2 by 1 cm slightly indurated with minimal erythema right buttock and and looks like he has small dimple consistent with epidermal cyst.  This is draining some purulence.  There is no fluctuance.  Minimally tender to palpation  Neurological:     Mental Status: He is alert and oriented to person, place, and time.        Assessment:     #1 type 2 diabetes well-controlled with A1c 5.9%  #2 hypertension stable and at goal  #3 probable abscessed sebaceous cyst of right buttock.  This is draining spontaneously  with no significant cellulitis changes.  No indication for I and D at this time.    Plan:     -We recommended some warm sits baths with Epsom salts to facilitate drainage and follow-up for any recurrent pain fever or other concerns -Continue current medications.  We will plan on routine follow-up in 3 months and obtain full labs at that point  [name] MD [facility] at [facility]

## 2018-10-11 NOTE — Patient Instructions (Signed)
Recommend daily warm bath with epsom salts- to help infected cyst drain.

## 2018-11-11 DIAGNOSIS — H179 Unspecified corneal scar and opacity: Secondary | ICD-10-CM | POA: Diagnosis not present

## 2018-11-11 DIAGNOSIS — H0102B Squamous blepharitis left eye, upper and lower eyelids: Secondary | ICD-10-CM | POA: Diagnosis not present

## 2018-11-11 DIAGNOSIS — H401132 Primary open-angle glaucoma, bilateral, moderate stage: Secondary | ICD-10-CM | POA: Diagnosis not present

## 2018-11-11 DIAGNOSIS — Z961 Presence of intraocular lens: Secondary | ICD-10-CM | POA: Diagnosis not present

## 2018-11-11 DIAGNOSIS — H04123 Dry eye syndrome of bilateral lacrimal glands: Secondary | ICD-10-CM | POA: Diagnosis not present

## 2018-11-11 DIAGNOSIS — H0102A Squamous blepharitis right eye, upper and lower eyelids: Secondary | ICD-10-CM | POA: Diagnosis not present

## 2018-11-13 ENCOUNTER — Ambulatory Visit: Payer: PPO | Admitting: Podiatry

## 2018-11-13 ENCOUNTER — Encounter: Payer: Self-pay | Admitting: Podiatry

## 2018-11-13 DIAGNOSIS — E119 Type 2 diabetes mellitus without complications: Secondary | ICD-10-CM | POA: Diagnosis not present

## 2018-11-13 DIAGNOSIS — B351 Tinea unguium: Secondary | ICD-10-CM

## 2018-11-13 DIAGNOSIS — M79676 Pain in unspecified toe(s): Secondary | ICD-10-CM

## 2018-11-13 NOTE — Progress Notes (Signed)
Patient ID: Steven Ward, male   DOB: 10/19/1924, 85 y.o.   MRN: [id number] Complaint:  Visit Type: Patient returns to my office for continued preventative foot care services. Complaint: Patient states" my nails have grown long and thick and become painful to walk and wear shoes" Patient has been diagnosed with DM with no  foot complications. He presents for preventative foot care services. No changes to ROS  Podiatric Exam: Vascular: dorsalis pedis and posterior tibial pulses are palpable bilateral. Capillary return is immediate. Temperature gradient is WNL. Skin turgor WNL  Sensorium: Normal Semmes Weinstein monofilament test. Normal tactile sensation bilaterally. Nail Exam: Pt has thick disfigured discolored nails with subungual debris noted bilateral entire nail hallux through fifth toenails Ulcer Exam: There is no evidence of ulcer or pre-ulcerative changes or infection. Orthopedic Exam: Muscle tone and strength are WNL. No limitations in general ROM. No crepitus or effusions noted. Foot type and digits show no abnormalities. Bony prominences are unremarkable. Skin: No Porokeratosis. No infection or ulcers  Diagnosis:  Tinea unguium, Pain in right toe, pain in left toes  Treatment & Plan Procedures and Treatment: Consent by patient was obtained for treatment procedures. The patient understood the discussion of treatment and procedures well. All questions were answered thoroughly reviewed. Debridement of mycotic and hypertrophic toenails, 1 through 5 bilateral and clearing of subungual debris. No ulceration, no infection noted. Return Visit-Office Procedure: Patient instructed to return to the office for a follow up visit 10 weeks  for continued evaluation and treatment.     Quindell Shere DPM 

## 2018-12-02 NOTE — Progress Notes (Signed)
Steven Ward was seen today in the movement disorders clinic for neurologic consultation at the request of Burchette, Alinda Sierras, MD.  The consultation is for the evaluation of tremor.  Tremor: Yes.     How long has it been going on? One year  At rest or with activation?  At rest  Fam hx of tremor?  Yes.    Located where?  RUE only  Affected by caffeine:  No.  Affected by alcohol:  Doesn't drink EtOH  Affected by stress:  unknown  Other specific sxs: Voice: a little softer Sleep: sleeps well  Vivid Dreams:  Yes.    Acting out dreams:  No. Wet Pillows: No. Postural symptoms:  Yes.    Falls?  Yes.  , one time, 3-4 months ago, didn't get hurt Bradykinesia symptoms: difficulty getting out of a chair Loss of smell:  Yes.   Loss of taste:  Yes.   Urinary Incontinence:  No. Difficulty Swallowing:  No. Handwriting, micrographia: Yes.   Trouble with ADL's:  Yes.    Trouble buttoning clothing: Yes.  , "my fingers ache" Depression:  No. Memory changes:  minimal Hallucinations:  No.  visual distortions: No. N/V:  No. Lightheaded:  No.  Syncope: No. Diplopia:  No.   01/15/18 update: Patient follows up today.  Records have been reviewed since our last visit.  Patient was diagnosed with Parkinson's disease last visit and started on carbidopa/levodopa 25/100, 1 tablet 3 times a day.  Patient states today that medication is not helping.  Taking medication at 7am/11-12pm/7-8pm.   He states "the medication didn't do nothing but make it worse and my neck hurts."   Patient did have lab work for me since last visit.  His TSH is elevated.  He was advised to follow-up with his primary care physician, which he has done.  meds not added/changed.  06/04/18 update: Patient is seen today in follow-up. He is accompanied by son who supplements the history.   His levodopa was increased last visit so that he is supposed to be taking carbidopa/levodopa 25/100, 2 tablets in the morning, 2 in the afternoon  and 1 in the evening. However, primary care records from April 22, 2018 indicate that the patient had dropped levodopa back to 2 tablets daily.  He currently is back to taking carbidopa/levodopa, 2 tablets at 12pm (awakens at 6:30 am), 2 at 6pm and 1 at bedtime.  He has had no falls.  No hallucinations.  No lightheadedness or near syncope.    12/03/18 update: Patient is seen today in follow-up for Parkinson's.  He is on carbidopa/levodopa 25/100, 2 tablets in the morning, 2 in the afternoon and 1 in the evening.  Last visit, I told him to move these dosages closer together so that they were not spread so far apart.  He reports today that he is doing this.  "I don't know if the medication works.  I am doing the same."  He has had no falls.  Rare lightheadedness but no near syncope.  No hallucinations.  PREVIOUS MEDICATIONS: none to date  ALLERGIES:   Allergies  Allergen Reactions  . Amoxicillin     REACTION: rash, dizziness    CURRENT MEDICATIONS:  Outpatient Encounter Medications as of 12/03/2018  Medication Sig  . benazepril (LOTENSIN) 10 MG tablet Take 1 tablet (10 mg total) by mouth daily.  . blood glucose meter kit and supplies KIT Dispense based on patient and insurance preference. Use up to four times  daily as directed. (FOR ICD-9 250.00, 250.01).  . Blood Glucose Monitoring Suppl (ONE TOUCH ULTRA 2) W/DEVICE KIT Use as directed.  . carbidopa-levodopa (SINEMET IR) 25-100 MG tablet TAKE 2 TABLETS BY MOUTH IN THE MORNING, THEN TAKE  2 TABLETS IN THE AFTERNOON, AND 1 TABLET IN THE EVENING  . glucose blood (ONE TOUCH ULTRA TEST) test strip Test blood sugar 3 times a day  . hydrochlorothiazide (HYDRODIURIL) 25 MG tablet TAKE 1/2 (ONE-HALF) TABLET BY MOUTH ONCE DAILY  . Lancets (ONETOUCH ULTRASOFT) lancets Check 3 times daily. E11.9  . metFORMIN (GLUCOPHAGE-XR) 750 MG 24 hr tablet TAKE 1 TABLET BY MOUTH IN THE MORNING WITH BREAKFAST  . Pancrelipase, Lip-Prot-Amyl, (CREON) 12000 UNITS CPEP Take  by mouth 3 (three) times daily before meals.    . polyethylene glycol (MIRALAX / GLYCOLAX) packet Take 17 g by mouth daily as needed for mild constipation.  . sertraline (ZOLOFT) 50 MG tablet Take 1/2 tablet by mouth for 4 days, Then take 1 tablet by mouth daily. (Patient taking differently: Take 50 mg by mouth daily. )  . tamsulosin (FLOMAX) 0.4 MG CAPS capsule TAKE ONE CAPSULE BY MOUTH ONCE DAILY. (Patient taking differently: Take 0.4 mg by mouth daily after supper. )  . triamcinolone cream (KENALOG) 0.1 % APPLY  CREAM EXTERNALLY TO AFFECTED AREA TWICE DAILY AS NEEDED   No facility-administered encounter medications on file as of 12/03/2018.     PAST MEDICAL HISTORY:   Past Medical History:  Diagnosis Date  . ABNORMAL THYROID FUNCTION TESTS 01/17/2010  . ALLERGIC RHINITIS 12/01/2008  . Colon cancer ([place])   . COLONIC POLYPS, HX OF 12/01/2008  . DIABETES MELLITUS, TYPE II 12/01/2008  . EDEMA LEG 05/04/2009  . ESSENTIAL HYPERTENSION 12/01/2008  . Exocrine pancreatic insufficiency   . GALLSTONES 04/07/2009  . GERD 12/01/2008  . Hay fever   . SPONDYLOSIS, LUMBAR 12/01/2008    PAST SURGICAL HISTORY:   Past Surgical History:  Procedure Laterality Date  . ANKLE SURGERY Right   . CATARACT EXTRACTION    . CHOLECYSTECTOMY    . COLON SURGERY     resection 1990 for cancer  . LEG SURGERY Left     SOCIAL HISTORY:   Social History   Socioeconomic History  . Marital status: Widowed    Spouse name: Not on file  . Number of children: 2  . Years of education: Not on file  . Highest education level: Not on file  Occupational History  . 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 Financial resource strain: Not on file  . Food insecurity:    Worry: Not on file    Inability: Not on file  . Transportation needs:    Medical: Not on file    Non-medical: Not on file  Tobacco Use  . Smoking status: Former Smoker    Packs/day: 1.00    Years:  20.00    Pack years: 20.00    Types: Cigarettes    Last attempt to quit: 12/28/1968    Years since quitting: 49.9  . Smokeless tobacco: Never Used  Substance and Sexual Activity  . Alcohol use: No  . Drug use: No  . Sexual activity: Not on file  Lifestyle  . Physical activity:    Days per week: Not on file    Minutes per session: Not on file  . Stress: Not on file  Relationships  . Social connections:  Talks on phone: Not on file    Gets together: Not on file    Attends religious service: Not on file    Active member of club or organization: Not on file    Attends meetings of clubs or organizations: Not on file    Relationship status: Not on file  . Intimate partner violence:    Fear of current or ex partner: Not on file    Emotionally abused: Not on file    Physically abused: Not on file    Forced sexual activity: Not on file  Other Topics Concern  . Not on file  Social History Narrative  . Not on file    FAMILY HISTORY:   Family Status  Relation Name Status  . Other  (Not Specified)  . Mother  Deceased  . Father  Deceased  . Sister  Deceased  . Brother x3 Alive  . Son x2 Alive    ROS:  Review of Systems  Constitutional: Negative.   HENT: Negative.   Eyes: Negative.   Respiratory: Negative.   Cardiovascular: Negative.   Gastrointestinal: Positive for constipation.  Genitourinary: Negative.   Skin: Negative.     PHYSICAL EXAMINATION:    VITALS:   Vitals:   12/03/18 1031  BP: (!) 148/68  Pulse: 64  Temp: 97.8 F (36.6 C)  TempSrc: Oral  SpO2: 92%  Weight: 195 lb (88.5 kg)  Height: 5' 8" (1.727 m)    GEN:  The patient appears stated age and is in NAD. HEENT:  Normocephalic, atraumatic.  The mucous membranes are moist. The superficial temporal arteries are without ropiness or tenderness. CV:  RRR Lungs:  CTAB Neck/HEME:  There are no carotid bruits bilaterally.  Neurological examination:  Orientation: The patient is alert and oriented x3.  Cranial nerves: There is good facial symmetry. The speech is fluent and clear. Soft palate rises symmetrically and there is no tongue deviation. Hearing is intact to conversational tone. Sensation: Sensation is intact to light touch throughout Motor: Strength is 5/5 in the bilateral upper and lower extremities.   Shoulder shrug is equal and symmetric.  There is no pronator drift.   Movement examination: Tone: There is mild increased tone in the RUE Abnormal movements: There is rare and intermittent RUE tremor.   Coordination:  There is decremation with hand opening and closing and finger taps on the right Gait and Station: The patient has minimal difficulty arising out of a deep-seated chair without the use of the hands. The patient is flexed at the waist.  His arm swing is good today (same as last visit).  Labs:  Lab Results  Component Value Date   TSH 5.71 (H) 09/24/2017     Chemistry      Component Value Date/Time   NA 141 02/25/2018 0920   K 4.4 02/25/2018 0920   CL 106 02/25/2018 0920   CO2 27 02/25/2018 0920   BUN 27 (H) 02/25/2018 0920   CREATININE 1.43 02/25/2018 0920      Component Value Date/Time   CALCIUM 9.1 02/25/2018 0920   ALKPHOS 66 02/04/2018 1057   AST 12 02/04/2018 1057   ALT 5 02/04/2018 1057   BILITOT 0.5 02/04/2018 1057       ASSESSMENT/PLAN:  1.  Idiopathic Parkinsons disease.  The patient has tremor, bradykinesia, rigidity and mild postural instability.  -Despite the patient's assertion that the medication does not help, he is much less rigid than before starting medication.  -continue carbidopa/levodopa 25/100 tid.  Risks, benefits, side effects and alternative therapies were discussed.  The opportunity to ask questions was given and they were answered to the best of my ability.  The patient expressed understanding and willingness to follow the outlined treatment protocols.  -encouraged exercise.  He is resistant.   -discussed covid-19 and  precautions in his age group and how it relates to PD.  Recommended he hold off on routine office visits for now.  2.  Constipation  -discussed increased water intake.  Discussed this last visit as well  3.  Patient did not want to follow-up for 1 year.  We ultimately compromised on 6 to 9 months.  Total face to face time:  25 minutes.    Cc:  Burchette, Bruce W, MD 

## 2018-12-03 ENCOUNTER — Ambulatory Visit: Payer: PPO | Admitting: Neurology

## 2018-12-03 ENCOUNTER — Other Ambulatory Visit: Payer: Self-pay

## 2018-12-03 ENCOUNTER — Encounter: Payer: Self-pay | Admitting: Neurology

## 2018-12-03 VITALS — BP 148/68 | HR 64 | Temp 97.8°F | Ht 68.0 in | Wt 195.0 lb

## 2018-12-03 DIAGNOSIS — G20 Parkinson's disease: Secondary | ICD-10-CM | POA: Diagnosis not present

## 2018-12-03 NOTE — Patient Instructions (Signed)
The [facility] will be hosting a [employer] on Wednesday, March 18th, 10:00-10:30am, called "Ask the Experts: Coronavirus and Parkinson's Disease." Please visit [email or web address]. This will be a live event with Dr. Simona Huh, the [facility] of the [facility], and Dr. Roslynn Amble. [place], infectious disease expert from [facility] [place].    I urge you to refer to these two articles to learn more right now: [email or web address] and [email or web address]

## 2018-12-17 ENCOUNTER — Telehealth: Payer: Self-pay | Admitting: Family Medicine

## 2018-12-17 NOTE — Telephone Encounter (Signed)
Copied from [place] [phone]. Topic: Quick Communication - Rx Refill/Question >> Dec 17, 2018  1:20 PM Gustavus Messing wrote: Medication: sertraline (ZOLOFT) 50 MG tablet [[id number]]   Has the patient contacted their pharmacy? No. (Agent: If no, request that the patient contact the pharmacy for the refill.) The patient needs a refill on the Zoloft   Preferred Pharmacy (with phone number or street name): [place], [place] [phone] (Phone) [phone] (Fax)    Agent: Please be advised that RX refills may take up to 3 business days. We ask that you follow-up with your pharmacy.

## 2018-12-18 ENCOUNTER — Other Ambulatory Visit: Payer: Self-pay

## 2018-12-18 MED ORDER — SERTRALINE HCL 50 MG PO TABS: 50.0000 mg | ORAL_TABLET | Freq: Every day | ORAL | 1 refills | Status: DC

## 2018-12-19 NOTE — Telephone Encounter (Signed)
Called patient and let patient know that his medication has been sent to the pharmacy. Patient verbalized an understanding.

## 2019-01-10 ENCOUNTER — Other Ambulatory Visit: Payer: Self-pay

## 2019-01-10 ENCOUNTER — Ambulatory Visit (INDEPENDENT_AMBULATORY_CARE_PROVIDER_SITE_OTHER): Payer: PPO | Admitting: Family Medicine

## 2019-01-10 DIAGNOSIS — N183 Chronic kidney disease, stage 3 (moderate): Secondary | ICD-10-CM | POA: Diagnosis not present

## 2019-01-10 DIAGNOSIS — E785 Hyperlipidemia, unspecified: Secondary | ICD-10-CM

## 2019-01-10 DIAGNOSIS — E1122 Type 2 diabetes mellitus with diabetic chronic kidney disease: Secondary | ICD-10-CM

## 2019-01-10 DIAGNOSIS — M48061 Spinal stenosis, lumbar region without neurogenic claudication: Secondary | ICD-10-CM | POA: Diagnosis not present

## 2019-01-10 DIAGNOSIS — E1121 Type 2 diabetes mellitus with diabetic nephropathy: Secondary | ICD-10-CM

## 2019-01-10 DIAGNOSIS — I10 Essential (primary) hypertension: Secondary | ICD-10-CM | POA: Diagnosis not present

## 2019-01-10 MED ORDER — BENAZEPRIL HCL 10 MG PO TABS: 10.0000 mg | ORAL_TABLET | Freq: Every day | ORAL | 3 refills | Status: DC

## 2019-01-10 MED ORDER — METFORMIN HCL ER 750 MG PO TB24: ORAL_TABLET | ORAL | 3 refills | Status: DC

## 2019-01-10 NOTE — Progress Notes (Signed)
Patient ID: Steven Ward, male   DOB: 10/21/1924, 85 y.o.   MRN: [id number]  This visit type was conducted due to national recommendations for restrictions regarding the COVID-19 pandemic in an effort to limit this patient's exposure and mitigate transmission in our community.   Virtual Visit via Telephone Note  I connected with [name] on 01/10/19 at 10:00 AM EDT by telephone and verified that I am speaking with the correct person using two identifiers.   I discussed the limitations, risks, security and privacy concerns of performing an evaluation and management service by telephone and the availability of in person appointments. I also discussed with the patient that there may be a patient responsible charge related to this service. The patient expressed understanding and agreed to proceed.  Location patient: home Location provider: work or home office Participants present for the call: patient, provider Patient did not have a visit in the prior 7 days to address this/these issue(s).   History of Present Illness:  Patient has chronic problems including hypertension, GERD, type 2 diabetes, stage III chronic kidney disease, Parkinson's disease, chronic back pain secondary to lumbar stenosis, and dyslipidemia.  Current medications include Lotensin, Sinemet, HCTZ, metformin, sertraline, and Flomax.  Blood sugars have been very well controlled.  Last A1c was 5.9%.  He states his fasting blood sugars are consistently between 100-130.  No polyuria or polydipsia.  He is due for other labs including renal function and chemistries.  His main complaint is ongoing chronic back pain.  Not relieved with Tylenol.  He has tried tramadol in the past without much relief.  He wishes to avoid chronic pain medications.  Denies any recent falls.  Appetite and weight are stable.  He feels well overall.  No recent chest pains or dizziness.   Observations/Objective: Patient sounds cheerful and well on  the phone. I do not appreciate any SOB. Speech and thought processing are grossly intact. Patient reported vitals:  Assessment and Plan:  #1 type 2 diabetes.  History of good control -Refill metformin. -Plan repeat A1c at follow-up in a few months  #2 hypertension.  History of good control -Refill Lotensin  #3 history of chronic low back pain secondary to lumbar stenosis -Patient declines further medications at this time  #4 chronic kidney disease-stage III -Avoid non-steroidals -Repeat basic metabolic panel at follow-up  Follow Up Instructions:  - recommend setting up 3 month follow up.       did not refer this patient for an OV in the next 24 hours for this/these issue(s).  I discussed the assessment and treatment plan with the patient. The patient was provided an opportunity to ask questions and all were answered. The patient agreed with the plan and demonstrated an understanding of the instructions.   The patient was advised to call back or seek an in-person evaluation if the symptoms worsen or if the condition fails to improve as anticipated.  I provided 25 minutes of non-face-to-face time during this encounter.   Carolann Littler, MD

## 2019-01-22 ENCOUNTER — Encounter: Payer: Self-pay | Admitting: Podiatry

## 2019-01-22 ENCOUNTER — Ambulatory Visit: Payer: PPO | Admitting: Podiatry

## 2019-01-22 ENCOUNTER — Other Ambulatory Visit: Payer: Self-pay

## 2019-01-22 VITALS — Temp 97.3°F

## 2019-01-22 DIAGNOSIS — B351 Tinea unguium: Secondary | ICD-10-CM

## 2019-01-22 DIAGNOSIS — E119 Type 2 diabetes mellitus without complications: Secondary | ICD-10-CM

## 2019-01-22 DIAGNOSIS — M79676 Pain in unspecified toe(s): Secondary | ICD-10-CM

## 2019-01-22 NOTE — Progress Notes (Signed)
Patient ID: Steven Ward, male   DOB: Sep 20, 1924, 85 y.o.   MRN: [id number] Complaint:  Visit Type: Patient returns to my office for continued preventative foot care services. Complaint: Patient states" my nails have grown long and thick and become painful to walk and wear shoes" Patient has been diagnosed with DM with no  foot complications. He presents for preventative foot care services. No changes to ROS  Podiatric Exam: Vascular: dorsalis pedis and posterior tibial pulses are palpable bilateral. Capillary return is immediate. Temperature gradient is WNL. Skin turgor WNL  Sensorium: Normal Semmes Weinstein monofilament test. Normal tactile sensation bilaterally. Nail Exam: Pt has thick disfigured discolored nails with subungual debris noted bilateral entire nail hallux through fifth toenails Ulcer Exam: There is no evidence of ulcer or pre-ulcerative changes or infection. Orthopedic Exam: Muscle tone and strength are WNL. No limitations in general ROM. No crepitus or effusions noted. Foot type and digits show no abnormalities. Bony prominences are unremarkable. Skin: No Porokeratosis. No infection or ulcers  Diagnosis:  Tinea unguium, Pain in right toe, pain in left toes  Treatment & Plan Procedures and Treatment: Consent by patient was obtained for treatment procedures. The patient understood the discussion of treatment and procedures well. All questions were answered thoroughly reviewed. Debridement of mycotic and hypertrophic toenails, 1 through 5 bilateral and clearing of subungual debris. No ulceration, no infection noted. Return Visit-Office Procedure: Patient instructed to return to the office for a follow up visit 10 weeks  for continued evaluation and treatment.     Gardiner Barefoot DPM

## 2019-02-04 ENCOUNTER — Other Ambulatory Visit: Payer: Self-pay | Admitting: Neurology

## 2019-03-17 DIAGNOSIS — H0102B Squamous blepharitis left eye, upper and lower eyelids: Secondary | ICD-10-CM | POA: Diagnosis not present

## 2019-03-17 DIAGNOSIS — H04123 Dry eye syndrome of bilateral lacrimal glands: Secondary | ICD-10-CM | POA: Diagnosis not present

## 2019-03-17 DIAGNOSIS — H401132 Primary open-angle glaucoma, bilateral, moderate stage: Secondary | ICD-10-CM | POA: Diagnosis not present

## 2019-03-17 DIAGNOSIS — H179 Unspecified corneal scar and opacity: Secondary | ICD-10-CM | POA: Diagnosis not present

## 2019-03-17 DIAGNOSIS — H0102A Squamous blepharitis right eye, upper and lower eyelids: Secondary | ICD-10-CM | POA: Diagnosis not present

## 2019-03-17 DIAGNOSIS — Z961 Presence of intraocular lens: Secondary | ICD-10-CM | POA: Diagnosis not present

## 2019-03-19 ENCOUNTER — Other Ambulatory Visit: Payer: Self-pay | Admitting: Family Medicine

## 2019-04-02 ENCOUNTER — Other Ambulatory Visit: Payer: Self-pay

## 2019-04-02 ENCOUNTER — Ambulatory Visit (INDEPENDENT_AMBULATORY_CARE_PROVIDER_SITE_OTHER): Payer: PPO | Admitting: Family Medicine

## 2019-04-02 ENCOUNTER — Encounter: Payer: Self-pay | Admitting: Family Medicine

## 2019-04-02 VITALS — BP 108/62 | HR 59 | Temp 97.5°F | Ht 68.0 in | Wt 193.4 lb

## 2019-04-02 DIAGNOSIS — I10 Essential (primary) hypertension: Secondary | ICD-10-CM

## 2019-04-02 DIAGNOSIS — E1121 Type 2 diabetes mellitus with diabetic nephropathy: Secondary | ICD-10-CM | POA: Diagnosis not present

## 2019-04-02 DIAGNOSIS — E785 Hyperlipidemia, unspecified: Secondary | ICD-10-CM | POA: Diagnosis not present

## 2019-04-02 DIAGNOSIS — E1122 Type 2 diabetes mellitus with diabetic chronic kidney disease: Secondary | ICD-10-CM

## 2019-04-02 DIAGNOSIS — N183 Chronic kidney disease, stage 3 unspecified: Secondary | ICD-10-CM

## 2019-04-02 DIAGNOSIS — N400 Enlarged prostate without lower urinary tract symptoms: Secondary | ICD-10-CM | POA: Diagnosis not present

## 2019-04-02 DIAGNOSIS — M5416 Radiculopathy, lumbar region: Secondary | ICD-10-CM

## 2019-04-02 LAB — POCT GLYCOSYLATED HEMOGLOBIN (HGB A1C): Hemoglobin A1C: 6.3 % — AB (ref 4.0–5.6)

## 2019-04-02 LAB — BASIC METABOLIC PANEL
BUN: 38 mg/dL — ABNORMAL HIGH (ref 6–23)
CO2: 30 mEq/L (ref 19–32)
Calcium: 8.7 mg/dL (ref 8.4–10.5)
Chloride: 104 mEq/L (ref 96–112)
Creatinine, Ser: 1.69 mg/dL — ABNORMAL HIGH (ref 0.40–1.50)
GFR: 38.13 mL/min — ABNORMAL LOW (ref >60.00)
Glucose, Bld: 85 mg/dL (ref 70–99)
Potassium: 4.9 mEq/L (ref 3.5–5.1)
Sodium: 140 mEq/L (ref 135–145)

## 2019-04-02 LAB — HEPATIC FUNCTION PANEL
ALT: 4 U/L (ref 0–53)
AST: 11 U/L (ref 0–37)
Albumin: 3.9 g/dL (ref 3.5–5.2)
Alkaline Phosphatase: 73 U/L (ref 39–117)
Bilirubin, Direct: 0.1 mg/dL (ref 0.0–0.3)
Total Bilirubin: 0.6 mg/dL (ref 0.2–1.2)
Total Protein: 6.6 g/dL (ref 6.0–8.3)

## 2019-04-02 LAB — LIPID PANEL
Cholesterol: 140 mg/dL (ref 0–200)
HDL: 39.2 mg/dL (ref >39.00)
LDL Cholesterol: 86 mg/dL (ref 0–99)
NonHDL: 100.88
Total CHOL/HDL Ratio: 4
Triglycerides: 76 mg/dL (ref 0.0–149.0)
VLDL: 15.2 mg/dL (ref 0.0–40.0)

## 2019-04-02 NOTE — Progress Notes (Signed)
Subjective:     Patient ID: Steven Ward, male   DOB: Sep 11, 1925, 85 y.o.   MRN: [id number]  HPI Patient seen for medical follow-up.  His chronic problems include history of hypertension, transient atrial fibrillation, type 2 diabetes, chronic kidney disease stage III, lumbar stenosis, Parkinson's disease, BPH  He is doing reasonably well.  No recent falls.  He has a 67 year old girlfriend that he sees periodically.  He has 2 sons and one lives locally and one in [place].  Medications reviewed.  His diabetes been very well controlled.  Not monitoring blood sugars regularly.  He has had past history of depression currently stable on sertraline.  Still remains very limited in walking ability because of his lumbar stenosis.  He has follow-up with podiatry this Friday for foot exam  BPH symptoms are stable on Flomax.  Still gets up once or twice at night.  Good flow.  Past Medical History:  Diagnosis Date  . ABNORMAL THYROID FUNCTION TESTS 01/17/2010  . ALLERGIC RHINITIS 12/01/2008  . Colon cancer ([place])   . COLONIC POLYPS, HX OF 12/01/2008  . DIABETES MELLITUS, TYPE II 12/01/2008  . EDEMA LEG 05/04/2009  . ESSENTIAL HYPERTENSION 12/01/2008  . Exocrine pancreatic insufficiency   . GALLSTONES 04/07/2009  . GERD 12/01/2008  . Hay fever   . SPONDYLOSIS, LUMBAR 12/01/2008   Past Surgical History:  Procedure Laterality Date  . ANKLE SURGERY Right   . CATARACT EXTRACTION    . CHOLECYSTECTOMY    . COLON SURGERY     resection 1990 for cancer  . LEG SURGERY Left     reports that he quit smoking about 50 years ago. His smoking use included cigarettes. He has a 20.00 pack-year smoking history. He has never used smokeless tobacco. He reports that he does not drink alcohol or use drugs. family history includes Cancer in his mother and another family member; Diabetes in an other family member; Stroke in an other family member. Allergies  Allergen Reactions  . Amoxicillin     REACTION: rash,  dizziness     Review of Systems  Constitutional: Negative for fatigue and unexpected weight change.  Eyes: Negative for visual disturbance.  Respiratory: Negative for cough, chest tightness and shortness of breath.   Cardiovascular: Negative for chest pain and palpitations.  Gastrointestinal: Negative for abdominal pain.  Endocrine: Negative for polydipsia and polyuria.  Musculoskeletal: Positive for arthralgias and back pain.  Neurological: Negative for dizziness, syncope, weakness, light-headedness and headaches.       Objective:   Physical Exam Constitutional:      Appearance: He is well-developed.  HENT:     Right Ear: External ear normal.     Left Ear: External ear normal.  Eyes:     Pupils: Pupils are equal, round, and reactive to light.  Neck:     Musculoskeletal: Neck supple.     Thyroid: No thyromegaly.  Cardiovascular:     Rate and Rhythm: Normal rate and regular rhythm.  Pulmonary:     Effort: Pulmonary effort is normal. No respiratory distress.     Breath sounds: Normal breath sounds. No wheezing or rales.  Musculoskeletal:     Comments: He has trace pitting edema lower legs bilaterally which is chronic and stable  Neurological:     Mental Status: He is alert and oriented to person, place, and time.        Assessment:     #1 type 2 diabetes well controlled with A1c 6.3%  #2  history of chronic kidney disease stage III  #3 hypertension stable and at goal  #4 history of BPH stable on Flomax  #5 Parkinson's disease treated with Sinemet and followed by neurology    Plan:     -Check basic metabolic panel, hepatic panel, lipid panel -Continue current diabetes regimen -Continue walking as tolerated -Reminder for flu vaccine this fall -We will plan routine follow-up in 6 months and sooner as needed  [name] MD [facility]

## 2019-04-04 ENCOUNTER — Ambulatory Visit: Payer: PPO | Admitting: Podiatry

## 2019-04-04 ENCOUNTER — Other Ambulatory Visit: Payer: Self-pay

## 2019-04-04 ENCOUNTER — Encounter: Payer: Self-pay | Admitting: Podiatry

## 2019-04-04 VITALS — Temp 97.2°F

## 2019-04-04 DIAGNOSIS — B351 Tinea unguium: Secondary | ICD-10-CM | POA: Diagnosis not present

## 2019-04-04 DIAGNOSIS — M79676 Pain in unspecified toe(s): Secondary | ICD-10-CM | POA: Diagnosis not present

## 2019-04-04 DIAGNOSIS — E119 Type 2 diabetes mellitus without complications: Secondary | ICD-10-CM

## 2019-04-04 NOTE — Progress Notes (Signed)
Patient ID: Steven Ward, male   DOB: 07-01-1925, 85 y.o.   MRN: [id number] Complaint:  Visit Type: Patient returns to my office for continued preventative foot care services. Complaint: Patient states" my nails have grown long and thick and become painful to walk and wear shoes" Patient has been diagnosed with DM with no  foot complications. He presents for preventative foot care services. No changes to ROS  Podiatric Exam: Vascular: dorsalis pedis and posterior tibial pulses are palpable bilateral. Capillary return is immediate. Temperature gradient is WNL. Skin turgor WNL  Sensorium: Normal Semmes Weinstein monofilament test. Normal tactile sensation bilaterally. Nail Exam: Pt has thick disfigured discolored nails with subungual debris noted bilateral entire nail hallux through fifth toenails Ulcer Exam: There is no evidence of ulcer or pre-ulcerative changes or infection. Orthopedic Exam: Muscle tone and strength are WNL. No limitations in general ROM. No crepitus or effusions noted. Foot type and digits show no abnormalities. Bony prominences are unremarkable. Skin: No Porokeratosis. No infection or ulcers  Diagnosis:  Tinea unguium, Pain in right toe, pain in left toes  Treatment & Plan Procedures and Treatment: Consent by patient was obtained for treatment procedures. The patient understood the discussion of treatment and procedures well. All questions were answered thoroughly reviewed. Debridement of mycotic and hypertrophic toenails, 1 through 5 bilateral and clearing of subungual debris. No ulceration, no infection noted. Return Visit-Office Procedure: Patient instructed to return to the office for a follow up visit 10 weeks  for continued evaluation and treatment.     Gardiner Barefoot DPM

## 2019-04-07 DIAGNOSIS — M25561 Pain in right knee: Secondary | ICD-10-CM | POA: Diagnosis not present

## 2019-04-07 DIAGNOSIS — M25361 Other instability, right knee: Secondary | ICD-10-CM | POA: Diagnosis not present

## 2019-04-07 DIAGNOSIS — M25571 Pain in right ankle and joints of right foot: Secondary | ICD-10-CM | POA: Diagnosis not present

## 2019-04-08 ENCOUNTER — Other Ambulatory Visit: Payer: Self-pay

## 2019-04-08 DIAGNOSIS — R7989 Other specified abnormal findings of blood chemistry: Secondary | ICD-10-CM

## 2019-04-21 DIAGNOSIS — M25561 Pain in right knee: Secondary | ICD-10-CM | POA: Diagnosis not present

## 2019-04-21 DIAGNOSIS — M25571 Pain in right ankle and joints of right foot: Secondary | ICD-10-CM | POA: Diagnosis not present

## 2019-05-08 ENCOUNTER — Other Ambulatory Visit: Payer: Self-pay | Admitting: Family Medicine

## 2019-05-25 DIAGNOSIS — Z20828 Contact with and (suspected) exposure to other viral communicable diseases: Secondary | ICD-10-CM | POA: Diagnosis not present

## 2019-06-13 ENCOUNTER — Encounter: Payer: Self-pay | Admitting: Podiatry

## 2019-06-13 ENCOUNTER — Other Ambulatory Visit: Payer: Self-pay

## 2019-06-13 ENCOUNTER — Ambulatory Visit: Payer: PPO | Admitting: Podiatry

## 2019-06-13 DIAGNOSIS — M79676 Pain in unspecified toe(s): Secondary | ICD-10-CM

## 2019-06-13 DIAGNOSIS — B351 Tinea unguium: Secondary | ICD-10-CM

## 2019-06-13 DIAGNOSIS — E119 Type 2 diabetes mellitus without complications: Secondary | ICD-10-CM | POA: Diagnosis not present

## 2019-06-13 NOTE — Progress Notes (Signed)
Patient ID: Steven Ward, male   DOB: 27-Aug-1925, 85 y.o.   MRN: [id number] Complaint:  Visit Type: Patient returns to my office for continued preventative foot care services. Complaint: Patient states" my nails have grown long and thick and become painful to walk and wear shoes" Patient has been diagnosed with DM with no  foot complications. He presents for preventative foot care services. No changes to ROS.  Patient presents with his son.  Podiatric Exam: Vascular: dorsalis pedis and posterior tibial pulses are palpable bilateral. Capillary return is immediate. Temperature gradient is WNL. Skin turgor WNL  Sensorium: Normal Semmes Weinstein monofilament test. Normal tactile sensation bilaterally. Nail Exam: Pt has thick disfigured discolored nails with subungual debris noted bilateral entire nail hallux through fifth toenails Ulcer Exam: There is no evidence of ulcer or pre-ulcerative changes or infection. Orthopedic Exam: Muscle tone and strength are WNL. No limitations in general ROM. No crepitus or effusions noted. Foot type and digits show no abnormalities. Bony prominences are unremarkable. Skin: No Porokeratosis. No infection or ulcers  Diagnosis:  Tinea unguium, Pain in right toe, pain in left toes  Treatment & Plan Procedures and Treatment: Consent by patient was obtained for treatment procedures. The patient understood the discussion of treatment and procedures well. All questions were answered thoroughly reviewed. Debridement of mycotic and hypertrophic toenails, 1 through 5 bilateral and clearing of subungual debris. No ulceration, no infection noted. Return Visit-Office Procedure: Patient instructed to return to the office for a follow up visit 10 weeks  for continued evaluation and treatment.     Gardiner Barefoot DPM

## 2019-07-14 NOTE — Progress Notes (Signed)
Virtual Visit via Video Note The purpose of this virtual visit is to provide medical care while limiting exposure to the novel coronavirus.    Consent was obtained for video visit:  Yes.   Answered questions that patient had about telehealth interaction:  Yes.   I discussed the limitations, risks, security and privacy concerns of performing an evaluation and management service by telemedicine. I also discussed with the patient that there may be a patient responsible charge related to this service. The patient expressed understanding and agreed to proceed.  Pt location: Home Physician Location: hoe Name of referring provider:  [name], MD I connected with Elwyn Lade Schaum at patients initiation/request on 07/16/2019 at 10:45 AM EDT by video enabled telemedicine application and verified that I am speaking with the correct person using two identifiers. Pt MRN:  [id number] Pt DOB:  08/19/1925 Video Participants:  Evelena Leyden;  Son supplements history   History of Present Illness:  Patient seen today in follow-up for Parkinson's disease.  Son with patient and supplements the history.  He is carbidopa/levodopa 25/100, 2 tablets in the morning, 2 in the afternoon and 1 in the evening.  He admits that he often does not take the 1 in the evening as he does not think the medication works.  Think that medication makes him sleepy - "I don't think that the medication helps." Pt denies falls but son reports he had a fall but it was really a syncopal episode.   Pt has had lightheadeness.  He is now living with his son.    No hallucinations.  Mood has been good.  Medical records have been reviewed since our last visit.  He saw his primary care physician on April 24 and July 15.  Patient asks me if he can drive.  Son is not letting him drive.  Patient has moved in with his son permanently.   Current Outpatient Medications on File Prior to Visit  Medication Sig Dispense Refill  . benazepril  (LOTENSIN) 10 MG tablet Take 1 tablet (10 mg total) by mouth daily. 90 tablet 3  . blood glucose meter kit and supplies KIT Dispense based on patient and insurance preference. Use up to four times daily as directed. (FOR ICD-9 250.00, 250.01). 1 each 0  . Blood Glucose Monitoring Suppl (ONE TOUCH ULTRA 2) W/DEVICE KIT Use as directed. 1 each 0  . carbidopa-levodopa (SINEMET IR) 25-100 MG tablet TAKE 2 TABLETS BY MOUTH IN THE MORNING 2 AT NOON 1 IN THE EVENING 450 tablet 1  . hydrochlorothiazide (HYDRODIURIL) 25 MG tablet Take 1/2 (one-half) tablet by mouth once daily 45 tablet 0  . Lancets (ONETOUCH ULTRASOFT) lancets Check 3 times daily. E11.9 300 each 3  . metFORMIN (GLUCOPHAGE-XR) 750 MG 24 hr tablet TAKE 1 TABLET BY MOUTH IN THE MORNING WITH BREAKFAST 90 tablet 3  . ONETOUCH ULTRA test strip Test blood sugar 3 times a day 300 each 1  . Pancrelipase, Lip-Prot-Amyl, (CREON) 12000 UNITS CPEP Take by mouth 3 (three) times daily before meals.      . polyethylene glycol (MIRALAX / GLYCOLAX) packet Take 17 g by mouth daily as needed for mild constipation.    . sertraline (ZOLOFT) 50 MG tablet Take 1 tablet (50 mg total) by mouth daily. 90 tablet 1  . tamsulosin (FLOMAX) 0.4 MG CAPS capsule TAKE ONE CAPSULE BY MOUTH ONCE DAILY. (Patient taking differently: Take 0.4 mg by mouth daily after supper. ) 90 capsule 3  .  timolol (TIMOPTIC) 0.5 % ophthalmic solution INSTILL 1 DROP INTO RIGHT EYE IN THE MORNING    . triamcinolone cream (KENALOG) 0.1 % APPLY  CREAM EXTERNALLY TO AFFECTED AREA TWICE DAILY AS NEEDED 80 g 2   No current facility-administered medications on file prior to visit.      Observations/Objective:   Vitals:   07/16/19 0943  Weight: 190 lb (86.2 kg)  Height: '5\' 8"'$  (1.727 m)   GEN:  The patient appears stated age and is in NAD.  Neurological examination:  Orientation: The patient is alert and oriented x3. Cranial nerves: There is good facial symmetry. There is facial hypomimia.   The speech is fluent and clear. Soft palate rises symmetrically and there is no tongue deviation. Hearing is intact to conversational tone. Motor: Strength is at least antigravity x 4.   Shoulder shrug is equal and symmetric.  There is no pronator drift.  Movement examination: Tone: unable Abnormal movements: None Coordination:  There is no decremation with RAM's, with hand opening and closing her finger taps bilaterally. Gait and Station: The patient pushes off the chair to arise.  He has a stooped posture.  He has purposeful arm swing and is walking fairly good today (son states that he is showing off as he usually shuffles).    Chemistry      Component Value Date/Time   NA 140 04/02/2019 1036   K 4.9 04/02/2019 1036   CL 104 04/02/2019 1036   CO2 30 04/02/2019 1036   BUN 38 (H) 04/02/2019 1036   CREATININE 1.69 (H) 04/02/2019 1036      Component Value Date/Time   CALCIUM 8.7 04/02/2019 1036   ALKPHOS 73 04/02/2019 1036   AST 11 04/02/2019 1036   ALT 4 04/02/2019 1036   BILITOT 0.6 04/02/2019 1036     Lab Results  Component Value Date   TSH 5.71 (H) 09/24/2017     Assessment and Plan:   1.  Idiopathic Parkinsons disease.  The patient has tremor, bradykinesia, rigidity and mild postural instability.             -Despite the patient's assertion that the medication does not help, I have seen in the office that he is much less rigid than he was prior to taking the medication.  -Patient does think that medication is causing daytime hypersomnolence.  I am going to change him to the CR version.  We talked about compliance.  We talked about interactions with protein.  His son has never been present for this discussion before and patient is now living with his son.  He will take carbidopa/levodopa 25/100 CR, 2 tablets at 7 AM, 2 tablets at 11 AM and 1 tablet at 4 PM.            2.    Lightheadedness and syncopal episode  -Likely orthostatic in nature.  I asked the patient/son to  keep track of blood pressures in various positions.  Patient is on 2 different blood pressure medications.  Patient's daughter-in-law (whom he lives with) is a Marine scientist so they can get me those blood pressure readings.  His medications may need to be adjusted.  -Patient asked me about driving.  I told him he could not drive, based on [place] laws.  However, even once the 6 months is up, I do not want him driving given slow reaction time.  Son has not been letting the patient drive.  -Talk to the patient about the fact that dehydration likely  is contributing to the lightheadedness as well.  Son agrees that the patient really is not drinking much water.  Talked about the importance of hydration. Follow Up Instructions:  6 months.   -I discussed the assessment and treatment plan with the patient. The patient was provided an opportunity to ask questions and all were answered. The patient agreed with the plan and demonstrated an understanding of the instructions.   The patient was advised to call back or seek an in-person evaluation if the symptoms worsen or if the condition fails to improve as anticipated.    Total Time spent in visit with the patient was:  25 min, of which more than 50% of the time was spent in counseling .   Pt understands and agrees with the plan of care outlined.     Alonza Bogus, DO

## 2019-07-16 ENCOUNTER — Telehealth (INDEPENDENT_AMBULATORY_CARE_PROVIDER_SITE_OTHER): Payer: PPO | Admitting: Neurology

## 2019-07-16 ENCOUNTER — Encounter: Payer: Self-pay | Admitting: Neurology

## 2019-07-16 ENCOUNTER — Other Ambulatory Visit: Payer: Self-pay

## 2019-07-16 VITALS — Ht 68.0 in | Wt 190.0 lb

## 2019-07-16 DIAGNOSIS — R55 Syncope and collapse: Secondary | ICD-10-CM

## 2019-07-16 DIAGNOSIS — G20 Parkinson's disease: Secondary | ICD-10-CM

## 2019-07-16 MED ORDER — CARBIDOPA-LEVODOPA ER 25-100 MG PO TBCR: EXTENDED_RELEASE_TABLET | ORAL | 1 refills | Status: DC

## 2019-08-05 ENCOUNTER — Other Ambulatory Visit: Payer: Self-pay

## 2019-08-05 ENCOUNTER — Encounter: Payer: Self-pay | Admitting: Family Medicine

## 2019-08-05 ENCOUNTER — Ambulatory Visit (INDEPENDENT_AMBULATORY_CARE_PROVIDER_SITE_OTHER): Payer: PPO | Admitting: Family Medicine

## 2019-08-05 ENCOUNTER — Ambulatory Visit: Payer: PPO | Admitting: Neurology

## 2019-08-05 VITALS — BP 105/60 | HR 71 | Temp 97.3°F | Ht 68.0 in | Wt 191.9 lb

## 2019-08-05 DIAGNOSIS — Z9181 History of falling: Secondary | ICD-10-CM | POA: Diagnosis not present

## 2019-08-05 DIAGNOSIS — M791 Myalgia, unspecified site: Secondary | ICD-10-CM

## 2019-08-05 DIAGNOSIS — E1121 Type 2 diabetes mellitus with diabetic nephropathy: Secondary | ICD-10-CM | POA: Diagnosis not present

## 2019-08-05 DIAGNOSIS — M48061 Spinal stenosis, lumbar region without neurogenic claudication: Secondary | ICD-10-CM | POA: Diagnosis not present

## 2019-08-05 DIAGNOSIS — R6889 Other general symptoms and signs: Secondary | ICD-10-CM

## 2019-08-05 LAB — POCT GLYCOSYLATED HEMOGLOBIN (HGB A1C): Hemoglobin A1C: 6 % — AB (ref 4.0–5.6)

## 2019-08-05 LAB — TSH: TSH: 4.68 u[IU]/mL — ABNORMAL HIGH (ref 0.35–4.50)

## 2019-08-05 LAB — SEDIMENTATION RATE: Sed Rate: 29 mm/hr — ABNORMAL HIGH (ref 0–20)

## 2019-08-05 NOTE — Progress Notes (Signed)
Subjective:     Patient ID: Steven Ward, male   DOB: 1925/06/01, 85 y.o.   MRN: [id number]  HPI Steven Ward has past history of atrial fibrillation, hypertension, type 2 diabetes, chronic kidney disease, Parkinson's disease, lumbar stenosis.  He is seen today accompanied by his son.  He is no longer driving.  He had episode several months ago where he had either a brief syncopal or near syncopal episode which was felt to be possibly orthostatic related.  Neurologist also suggested he not be driving.  Diabetes has been stable.  Fasting sugars usually around 130.  He is taking Metformin.  No other diabetes drugs.  major complaint today is increased "achiness" especially in his neck and upper back.  He relates relatively gradual onset.  He does have his chronic lumbar stenosis back pain but this pain is different.  He has general fatigue issues.  No statin use.   We noted that he had elevated TSH around 5.7 back in 2019 but this was not much change from prior value.  He needs follow-up levels.  B12 level was checked back in 2017 and was lower end of normal.  He is not currently taking any supplement.  Son has been checking his blood pressure and getting frequent readings 105/62 standing.  We obtained very similar reading today.  He has had some cold intolerance issues.  Past Medical History:  Diagnosis Date  . ABNORMAL THYROID FUNCTION TESTS 01/17/2010  . ALLERGIC RHINITIS 12/01/2008  . Colon cancer ([place])   . COLONIC POLYPS, HX OF 12/01/2008  . DIABETES MELLITUS, TYPE II 12/01/2008  . EDEMA LEG 05/04/2009  . ESSENTIAL HYPERTENSION 12/01/2008  . Exocrine pancreatic insufficiency   . GALLSTONES 04/07/2009  . GERD 12/01/2008  . Hay fever   . SPONDYLOSIS, LUMBAR 12/01/2008   Past Surgical History:  Procedure Laterality Date  . ANKLE SURGERY Right   . CATARACT EXTRACTION    . CHOLECYSTECTOMY    . COLON SURGERY     resection 1990 for cancer  . LEG SURGERY Left     reports that he quit  smoking about 50 years ago. His smoking use included cigarettes. He has a 20.00 pack-year smoking history. He has never used smokeless tobacco. He reports that he does not drink alcohol or use drugs. family history includes Cancer in his mother and another family member; Diabetes in an other family member; Stroke in an other family member. Allergies  Allergen Reactions  . Amoxicillin     REACTION: rash, dizziness     Review of Systems  Constitutional: Positive for fatigue. Negative for chills and fever.  Respiratory: Negative for cough and shortness of breath.   Cardiovascular: Negative for chest pain.  Endocrine: Positive for cold intolerance.  Musculoskeletal: Positive for back pain.  Neurological: Positive for light-headedness. Negative for seizures.       Objective:   Physical Exam Constitutional:      Appearance: He is well-developed.  HENT:     Right Ear: External ear normal.     Left Ear: External ear normal.  Eyes:     Pupils: Pupils are equal, round, and reactive to light.  Neck:     Musculoskeletal: Neck supple.     Thyroid: No thyromegaly.  Cardiovascular:     Rate and Rhythm: Normal rate and regular rhythm.  Pulmonary:     Effort: Pulmonary effort is normal. No respiratory distress.     Breath sounds: Normal breath sounds. No wheezing or rales.  Musculoskeletal:  Comments: Trace edema legs bilaterally  Neurological:     Mental Status: He is alert and oriented to person, place, and time.  Psychiatric:        Mood and Affect: Mood normal.        Assessment:     #1 type 2 diabetes well controlled with A1c 6.0%  #2 back pain.  He does have chronic lumbar stenosis but is describing some neck and diffuse upper back pain as well.  Doubt polymyalgia rheumatica but should be considered in his age range.  He is not describing any explosive onset  #3 nonspecific fatigue.  Past history of mildly elevated TSH.  Needs follow-up TSH levels especially in view of his  recent cold intolerance.    #4 Parkinson's disease.  He has had some occasional lightheadedness with standing but no significant orthostatic drop on check today  #5 hypertension.  Standing blood pressure 105/60  #6  Generalized weakness.  He has been progressively less mobile over recent years and has become more debilitated.  We discussed home PT to try to assist with mobility and also to teach him proper use of assistive devices    Plan:     -Check sedimentation rate and recheck TSH -A1c well controlled as above -We discussed setting up some home physical therapy to get engaged in some strengthening exercises and to reduce fall risk -Discontinue benazepril this time -Plan routine follow-up in 3 months and sooner as needed  [name] MD [facility]

## 2019-08-05 NOTE — Patient Instructions (Signed)
Hold the benazepril for now  Consider OTC B12 1,000 mcg once daily  We will set up home PT.

## 2019-08-07 DIAGNOSIS — M6281 Muscle weakness (generalized): Secondary | ICD-10-CM | POA: Diagnosis not present

## 2019-08-07 DIAGNOSIS — R2689 Other abnormalities of gait and mobility: Secondary | ICD-10-CM | POA: Diagnosis not present

## 2019-08-07 DIAGNOSIS — G20 Parkinson's disease: Secondary | ICD-10-CM | POA: Diagnosis not present

## 2019-08-07 DIAGNOSIS — M48061 Spinal stenosis, lumbar region without neurogenic claudication: Secondary | ICD-10-CM | POA: Diagnosis not present

## 2019-08-07 DIAGNOSIS — I10 Essential (primary) hypertension: Secondary | ICD-10-CM | POA: Diagnosis not present

## 2019-08-13 ENCOUNTER — Other Ambulatory Visit: Payer: Self-pay

## 2019-08-13 ENCOUNTER — Telehealth: Payer: Self-pay

## 2019-08-13 MED ORDER — HYDROCHLOROTHIAZIDE 25 MG PO TABS: ORAL_TABLET | ORAL | 0 refills | Status: DC

## 2019-08-13 NOTE — Telephone Encounter (Signed)
Medication has been sent to the pharmacy for patient. 

## 2019-08-13 NOTE — Telephone Encounter (Signed)
Copied from [place] [id number]. Topic: Quick Communication - Rx Refill/Question >> Aug 13, 2019  8:41 AM Carolyn Stare wrote: Medication hydrochlorothiazide (HYDRODIURIL) 25 MG tablet    pt is out of medication    Preferred [facility] [place]  ent: Please be advised that RX refills may take up to 3 business days. We ask that you follow-up with your pharmacy.

## 2019-08-20 DIAGNOSIS — G20 Parkinson's disease: Secondary | ICD-10-CM | POA: Diagnosis not present

## 2019-08-20 DIAGNOSIS — I10 Essential (primary) hypertension: Secondary | ICD-10-CM | POA: Diagnosis not present

## 2019-08-20 DIAGNOSIS — R2689 Other abnormalities of gait and mobility: Secondary | ICD-10-CM | POA: Diagnosis not present

## 2019-08-20 DIAGNOSIS — M48061 Spinal stenosis, lumbar region without neurogenic claudication: Secondary | ICD-10-CM | POA: Diagnosis not present

## 2019-08-20 DIAGNOSIS — M6281 Muscle weakness (generalized): Secondary | ICD-10-CM | POA: Diagnosis not present

## 2019-09-17 ENCOUNTER — Ambulatory Visit (INDEPENDENT_AMBULATORY_CARE_PROVIDER_SITE_OTHER): Payer: PPO | Admitting: Podiatry

## 2019-09-17 ENCOUNTER — Encounter: Payer: Self-pay | Admitting: Podiatry

## 2019-09-17 ENCOUNTER — Other Ambulatory Visit: Payer: Self-pay

## 2019-09-17 DIAGNOSIS — E119 Type 2 diabetes mellitus without complications: Secondary | ICD-10-CM

## 2019-09-17 DIAGNOSIS — B351 Tinea unguium: Secondary | ICD-10-CM

## 2019-09-17 DIAGNOSIS — M79676 Pain in unspecified toe(s): Secondary | ICD-10-CM

## 2019-09-17 NOTE — Progress Notes (Signed)
Patient ID: Steven Ward, male   DOB: 27-Aug-1925, 85 y.o.   MRN: [id number] Complaint:  Visit Type: Patient returns to my office for continued preventative foot care services. Complaint: Patient states" my nails have grown long and thick and become painful to walk and wear shoes" Patient has been diagnosed with DM with no  foot complications. He presents for preventative foot care services. No changes to ROS.  Patient presents with his son.  Podiatric Exam: Vascular: dorsalis pedis and posterior tibial pulses are palpable bilateral. Capillary return is immediate. Temperature gradient is WNL. Skin turgor WNL  Sensorium: Normal Semmes Weinstein monofilament test. Normal tactile sensation bilaterally. Nail Exam: Pt has thick disfigured discolored nails with subungual debris noted bilateral entire nail hallux through fifth toenails Ulcer Exam: There is no evidence of ulcer or pre-ulcerative changes or infection. Orthopedic Exam: Muscle tone and strength are WNL. No limitations in general ROM. No crepitus or effusions noted. Foot type and digits show no abnormalities. Bony prominences are unremarkable. Skin: No Porokeratosis. No infection or ulcers  Diagnosis:  Tinea unguium, Pain in right toe, pain in left toes  Treatment & Plan Procedures and Treatment: Consent by patient was obtained for treatment procedures. The patient understood the discussion of treatment and procedures well. All questions were answered thoroughly reviewed. Debridement of mycotic and hypertrophic toenails, 1 through 5 bilateral and clearing of subungual debris. No ulceration, no infection noted. Return Visit-Office Procedure: Patient instructed to return to the office for a follow up visit 10 weeks  for continued evaluation and treatment.     Gardiner Barefoot DPM

## 2019-10-03 ENCOUNTER — Ambulatory Visit (INDEPENDENT_AMBULATORY_CARE_PROVIDER_SITE_OTHER): Payer: PPO | Admitting: Family Medicine

## 2019-10-03 ENCOUNTER — Other Ambulatory Visit: Payer: Self-pay

## 2019-10-03 ENCOUNTER — Encounter: Payer: Self-pay | Admitting: Family Medicine

## 2019-10-03 VITALS — BP 116/72 | HR 64 | Temp 97.7°F | Ht 68.0 in | Wt 183.6 lb

## 2019-10-03 DIAGNOSIS — E1122 Type 2 diabetes mellitus with diabetic chronic kidney disease: Secondary | ICD-10-CM | POA: Diagnosis not present

## 2019-10-03 DIAGNOSIS — R634 Abnormal weight loss: Secondary | ICD-10-CM

## 2019-10-03 DIAGNOSIS — N183 Chronic kidney disease, stage 3 unspecified: Secondary | ICD-10-CM | POA: Diagnosis not present

## 2019-10-03 DIAGNOSIS — E1121 Type 2 diabetes mellitus with diabetic nephropathy: Secondary | ICD-10-CM

## 2019-10-03 DIAGNOSIS — I10 Essential (primary) hypertension: Secondary | ICD-10-CM

## 2019-10-03 LAB — BASIC METABOLIC PANEL
BUN: 43 mg/dL — ABNORMAL HIGH (ref 6–23)
CO2: 25 mEq/L (ref 19–32)
Calcium: 8.9 mg/dL (ref 8.4–10.5)
Chloride: 106 mEq/L (ref 96–112)
Creatinine, Ser: 2.04 mg/dL — ABNORMAL HIGH (ref 0.40–1.50)
GFR: 30.65 mL/min — ABNORMAL LOW (ref >60.00)
Glucose, Bld: 98 mg/dL (ref 70–99)
Potassium: 4.4 mEq/L (ref 3.5–5.1)
Sodium: 141 mEq/L (ref 135–145)

## 2019-10-03 LAB — POCT GLYCOSYLATED HEMOGLOBIN (HGB A1C): Hemoglobin A1C: 6.3 % — AB (ref 4.0–5.6)

## 2019-10-03 NOTE — Progress Notes (Signed)
Subjective:    Patient ID: Steven Ward, male    DOB: 1925/05/21, 86 y.o.   MRN: [id number]  HPI Steven Ward is seen for medical follow-up.  Accompanied by his son.  Chronic problems include hypertension, history of A. fib, type 2 diabetes, chronic kidney disease, Parkinson's disease, lumbar stenosis.  He has lost some weight since last visit but he states his appetite is fairly good.  His son thinks he is eating fairly well.  Recent thyroid functions were normal.  He has been skipping nighttime supper and interest of trying to lower his fasting blood sugars which recently have been up around 150.  He also lost a dear friend about 2 weeks ago.  This was his girlfriend who had a fall and hip injury and ended up dying of blood clot complication.  He is grieving some over that.  Chronic kidney disease.  Last basic metabolic panel revealed creatinine 1.69 back in the summer.  Not repeated since then.  No nonsteroidal use.  He does remain on Metformin.  Is on HCTZ for hypertension.  Blood pressure stable.  No dizziness.  No chest pains.  Past Medical History:  Diagnosis Date  . ABNORMAL THYROID FUNCTION TESTS 01/17/2010  . ALLERGIC RHINITIS 12/01/2008  . Colon cancer ([place])   . COLONIC POLYPS, HX OF 12/01/2008  . DIABETES MELLITUS, TYPE II 12/01/2008  . EDEMA LEG 05/04/2009  . ESSENTIAL HYPERTENSION 12/01/2008  . Exocrine pancreatic insufficiency   . GALLSTONES 04/07/2009  . GERD 12/01/2008  . Hay fever   . SPONDYLOSIS, LUMBAR 12/01/2008   Past Surgical History:  Procedure Laterality Date  . ANKLE SURGERY Right   . CATARACT EXTRACTION    . CHOLECYSTECTOMY    . COLON SURGERY     resection 1990 for cancer  . LEG SURGERY Left     reports that he quit smoking about 50 years ago. His smoking use included cigarettes. He has a 20.00 pack-year smoking history. He has never used smokeless tobacco. He reports that he does not drink alcohol or use drugs. family history includes Cancer in his mother  and another family member; Diabetes in an other family member; Stroke in an other family member. Allergies  Allergen Reactions  . Amoxicillin     REACTION: rash, dizziness     Review of Systems  Constitutional: Negative for chills, fatigue and fever.  Eyes: Negative for visual disturbance.  Respiratory: Negative for cough, chest tightness and shortness of breath.   Cardiovascular: Negative for chest pain, palpitations and leg swelling.  Gastrointestinal: Negative for abdominal pain.  Endocrine: Negative for polydipsia and polyuria.  Genitourinary: Negative for dysuria.  Musculoskeletal: Positive for back pain.  Neurological: Negative for dizziness, syncope, weakness, light-headedness and headaches.       Objective:   Physical Exam Constitutional:      Appearance: He is well-developed.  HENT:     Right Ear: External ear normal.     Left Ear: External ear normal.  Eyes:     Pupils: Pupils are equal, round, and reactive to light.  Neck:     Thyroid: No thyromegaly.  Cardiovascular:     Rate and Rhythm: Normal rate and regular rhythm.  Pulmonary:     Effort: Pulmonary effort is normal. No respiratory distress.     Breath sounds: Normal breath sounds. No wheezing or rales.  Musculoskeletal:     Cervical back: Neck supple.     Comments: Trace edema ankles bilaterally.  He basically has a  sock line but no significant edema above this  Neurological:     Mental Status: He is alert and oriented to person, place, and time.           Assessment & Plan:   #1 type 2 diabetes.  Remains well controlled with A1c 6.3% -We will plan follow-up in 3 to 4 months to reassess  #2 hypertension stable and at goal  #3 chronic kidney disease.  He had slight bump in creatinine last summer. -Recheck basic metabolic panel  #4 weight loss.  This has been relatively modest.  Possibly related to decreasing one of his meals per day.  We suggested adding more frequent small meals and possible  supplement with Glucerna  [name] MD [facility]

## 2019-10-09 ENCOUNTER — Other Ambulatory Visit: Payer: Self-pay | Admitting: Family Medicine

## 2019-10-09 MED ORDER — CARBIDOPA-LEVODOPA ER 25-100 MG PO TBCR: EXTENDED_RELEASE_TABLET | ORAL | 3 refills | Status: DC

## 2019-10-09 MED ORDER — METFORMIN HCL ER 750 MG PO TB24: ORAL_TABLET | ORAL | 1 refills | Status: DC

## 2019-10-09 MED ORDER — TAMSULOSIN HCL 0.4 MG PO CAPS: ORAL_CAPSULE | ORAL | 3 refills | Status: DC

## 2019-10-09 NOTE — Telephone Encounter (Signed)
Patient would like a 90 day supply of the following medications and have them sent to [facility] [place]:  Carbidopa-Levodopa ER (SINEMET CR) 25-100 MG tablet controlled release  metFORMIN (GLUCOPHAGE-XR) 750 MG 24 hr tablet   tamsulosin (FLOMAX) 0.4 MG CAPS capsule

## 2019-10-16 ENCOUNTER — Telehealth: Payer: Self-pay | Admitting: Family Medicine

## 2019-10-16 NOTE — Telephone Encounter (Addendum)
Pt needs his medication refill:[facility] [address], [place], [place] [address] Pt can be contacted at [phone]

## 2019-10-17 ENCOUNTER — Other Ambulatory Visit: Payer: Self-pay

## 2019-10-17 MED ORDER — TAMSULOSIN HCL 0.4 MG PO CAPS: ORAL_CAPSULE | ORAL | 3 refills | Status: DC

## 2019-10-17 NOTE — Telephone Encounter (Signed)
This medication has been sent to the pharmacy requested. I called and was not able to leave a voice message due to no voice mail available.

## 2019-11-03 ENCOUNTER — Telehealth: Payer: Self-pay | Admitting: Family Medicine

## 2019-11-03 NOTE — Progress Notes (Signed)
  Chronic Care Management   Outreach Note  11/03/2019 Name: Steven Ward MRN: [id number] DOB: 30-Dec-1924  Referred by: [name], MD Reason for referral : No chief complaint on file.   An unsuccessful telephone outreach was attempted today. The patient was referred to the pharmacist for assistance with care management and care coordination.   Follow Up Plan:   Raynicia Dukes [employer] Scheduler

## 2019-11-13 ENCOUNTER — Encounter: Payer: Self-pay | Admitting: *Deleted

## 2019-11-28 ENCOUNTER — Telehealth: Payer: Self-pay | Admitting: Family Medicine

## 2019-11-28 ENCOUNTER — Other Ambulatory Visit: Payer: Self-pay

## 2019-11-28 MED ORDER — TRIAMCINOLONE ACETONIDE 0.1 % EX CREA: TOPICAL_CREAM | CUTANEOUS | 2 refills | Status: AC

## 2019-11-28 NOTE — Telephone Encounter (Signed)
Medication has been sent to the pharmacy requested. °

## 2019-11-28 NOTE — Telephone Encounter (Signed)
Medication Refill:  Triamcinolone Cream  Pharmacy: [facility] FAX: [phone]

## 2019-12-03 ENCOUNTER — Telehealth: Payer: Self-pay | Admitting: Family Medicine

## 2019-12-03 NOTE — Progress Notes (Signed)
  Chronic Care Management   Outreach Note  12/03/2019 Name: Steven Ward MRN: [id number] DOB: 1924/09/22  Referred by: [name], MD Reason for referral : No chief complaint on file.   A second unsuccessful telephone outreach was attempted today. The patient was referred to pharmacist for assistance with care management and care coordination.  Follow Up Plan:   Raynicia Dukes [employer] Scheduler

## 2019-12-17 ENCOUNTER — Encounter: Payer: Self-pay | Admitting: Podiatry

## 2019-12-17 ENCOUNTER — Other Ambulatory Visit: Payer: Self-pay

## 2019-12-17 ENCOUNTER — Ambulatory Visit: Payer: PPO | Admitting: Podiatry

## 2019-12-17 DIAGNOSIS — E119 Type 2 diabetes mellitus without complications: Secondary | ICD-10-CM

## 2019-12-17 DIAGNOSIS — M79676 Pain in unspecified toe(s): Secondary | ICD-10-CM

## 2019-12-17 DIAGNOSIS — B351 Tinea unguium: Secondary | ICD-10-CM

## 2019-12-17 NOTE — Progress Notes (Signed)
This patient returns to my office for at risk foot care.  This patient requires this care by a professional since this patient will be at risk due to having  Diabetes and chronic kidney disease.  This patient is unable to cut nails himself since the patient cannot reach his nails.These nails are painful walking and wearing shoes.  This patient presents for at risk foot care today.  General Appearance  Alert, conversant and in no acute stress.  Vascular  Dorsalis pedis and posterior tibial  pulses are palpable  bilaterally.  Capillary return is within normal limits  bilaterally. Temperature is within normal limits  bilaterally.  Neurologic  Senn-Weinstein monofilament wire test within normal limits  bilaterally. Muscle power within normal limits bilaterally.  Nails Thick disfigured discolored nails with subungual debris  from hallux to fifth toes bilaterally. No evidence of bacterial infection or drainage bilaterally.  Orthopedic  No limitations of motion  feet .  No crepitus or effusions noted.  No bony pathology or digital deformities noted.  Skin  normotropic skin with no porokeratosis noted bilaterally.  No signs of infections or ulcers noted.     Onychomycosis  Pain in right toes  Pain in left toes  Consent was obtained for treatment procedures.   Mechanical debridement of nails 1-5  bilaterally performed with a nail nipper.  Filed with dremel without incident.    Return office visit     10 weeks                 Told patient to return for periodic foot care and evaluation due to potential at risk complications.   Kaloni Bisaillon DPM  

## 2020-01-02 ENCOUNTER — Ambulatory Visit (INDEPENDENT_AMBULATORY_CARE_PROVIDER_SITE_OTHER): Payer: PPO | Admitting: Family Medicine

## 2020-01-02 ENCOUNTER — Encounter: Payer: Self-pay | Admitting: Family Medicine

## 2020-01-02 ENCOUNTER — Other Ambulatory Visit: Payer: Self-pay

## 2020-01-02 VITALS — BP 136/78 | HR 72 | Temp 97.6°F | Wt 192.3 lb

## 2020-01-02 DIAGNOSIS — E1122 Type 2 diabetes mellitus with diabetic chronic kidney disease: Secondary | ICD-10-CM

## 2020-01-02 DIAGNOSIS — E1121 Type 2 diabetes mellitus with diabetic nephropathy: Secondary | ICD-10-CM

## 2020-01-02 DIAGNOSIS — N183 Chronic kidney disease, stage 3 unspecified: Secondary | ICD-10-CM

## 2020-01-02 DIAGNOSIS — N400 Enlarged prostate without lower urinary tract symptoms: Secondary | ICD-10-CM | POA: Diagnosis not present

## 2020-01-02 DIAGNOSIS — R600 Localized edema: Secondary | ICD-10-CM | POA: Diagnosis not present

## 2020-01-02 LAB — BASIC METABOLIC PANEL
BUN: 33 mg/dL — ABNORMAL HIGH (ref 6–23)
CO2: 25 mEq/L (ref 19–32)
Calcium: 8.7 mg/dL (ref 8.4–10.5)
Chloride: 110 mEq/L (ref 96–112)
Creatinine, Ser: 1.61 mg/dL — ABNORMAL HIGH (ref 0.40–1.50)
GFR: 40.26 mL/min — ABNORMAL LOW (ref >60.00)
Glucose, Bld: 117 mg/dL — ABNORMAL HIGH (ref 70–99)
Potassium: 3.9 mEq/L (ref 3.5–5.1)
Sodium: 143 mEq/L (ref 135–145)

## 2020-01-02 LAB — POCT GLYCOSYLATED HEMOGLOBIN (HGB A1C): Hemoglobin A1C: 6.5 % — AB (ref 4.0–5.6)

## 2020-01-02 MED ORDER — HYDROCHLOROTHIAZIDE 12.5 MG PO CAPS: 12.5000 mg | ORAL_CAPSULE | Freq: Every day | ORAL | 3 refills | Status: DC

## 2020-01-02 NOTE — Patient Instructions (Signed)
Elevate legs frequently  Go back on the HCTZ 12.5 mg once daily

## 2020-01-02 NOTE — Progress Notes (Signed)
Subjective:     Patient ID: Steven Ward, male   DOB: 1924-12-10, 86 y.o.   MRN: [id number]  HPI Mr. Tamburrino is here for medical follow-up.  He has type 2 diabetes which has generally been well controlled.  He has been on Metformin for that but has had some recent declining renal function and we had actually held his HCTZ.  He has had some progressive edema since then.  Has also noted that his weight is up about 9 pounds since last visit.  He denies any orthopnea.  He does have some edema with activity but this appears to be more chronic.  He states he is eating better since last visit.  He has good appetite.  Son thinks he is not drinking a lot of fluids.  He has history of BPH and takes Flomax but seems to be urinating fairly well.  No obstructive urinary symptoms.  He expresses some interest and transcutaneous blood glucose monitoring systems but he is not totally sure what he has coverage yet.  Past Medical History:  Diagnosis Date  . ABNORMAL THYROID FUNCTION TESTS 01/17/2010  . ALLERGIC RHINITIS 12/01/2008  . Colon cancer ([place])   . COLONIC POLYPS, HX OF 12/01/2008  . DIABETES MELLITUS, TYPE II 12/01/2008  . EDEMA LEG 05/04/2009  . ESSENTIAL HYPERTENSION 12/01/2008  . Exocrine pancreatic insufficiency   . GALLSTONES 04/07/2009  . GERD 12/01/2008  . Hay fever   . SPONDYLOSIS, LUMBAR 12/01/2008   Past Surgical History:  Procedure Laterality Date  . ANKLE SURGERY Right   . CATARACT EXTRACTION    . CHOLECYSTECTOMY    . COLON SURGERY     resection 1990 for cancer  . LEG SURGERY Left     reports that he quit smoking about 51 years ago. His smoking use included cigarettes. He has a 20.00 pack-year smoking history. He has never used smokeless tobacco. He reports that he does not drink alcohol or use drugs. family history includes Cancer in his mother and another family member; Diabetes in an other family member; Stroke in an other family member. Allergies  Allergen Reactions  .  Amoxicillin     REACTION: rash, dizziness       Review of Systems  Constitutional: Negative for chills, fatigue and fever.  Eyes: Negative for visual disturbance.  Respiratory: Positive for shortness of breath. Negative for cough, chest tightness and wheezing.   Cardiovascular: Positive for leg swelling. Negative for chest pain and palpitations.  Endocrine: Negative for polydipsia and polyuria.  Genitourinary: Negative for difficulty urinating and dysuria.  Neurological: Negative for dizziness, syncope, weakness, light-headedness and headaches.       Objective:   Physical Exam Vitals reviewed.  Constitutional:      Appearance: Normal appearance.  Cardiovascular:     Rate and Rhythm: Normal rate and regular rhythm.  Pulmonary:     Effort: Pulmonary effort is normal.     Breath sounds: Normal breath sounds.  Musculoskeletal:     Comments: He has 1+ pitting edema feet ankles legs bilaterally up to the knee  Neurological:     Mental Status: He is alert.        Assessment:     #1 type 2 diabetes.  Remains well controlled with A1c 6.5%  #2 bilateral leg edema.  Worse after stopping HCTZ which he had been on for several years.  Likely his edema is multifactorial related to some venous stasis and diastolic dysfunction.  Most recent albumin on record was normal.  #  3 chronic kidney disease with some recent worsening  #4 BPH stable on Flomax    Plan:     -Recheck basic metabolic panel -Add back HCTZ 12.5 mg once daily and monitor blood pressure closely  -Elevate legs more frequently and also try to increase his walking activity some -May need to look at discontinuation of Metformin if his renal function not improving  [name] MD [facility] at [facility]

## 2020-01-14 NOTE — Progress Notes (Deleted)
Virtual Visit Via Video   The purpose of this virtual visit is to provide medical care while limiting exposure to the novel coronavirus.    Consent was obtained for video visit:  {yes no:314532} Answered questions that patient had about telehealth interaction:  {yes no:314532} I discussed the limitations, risks, security and privacy concerns of performing an evaluation and management service by telemedicine. I also discussed with the patient that there may be a patient responsible charge related to this service. The patient expressed understanding and agreed to proceed.  Pt location: Home Physician Location: office Name of referring provider:  [name], MD I connected with Elwyn Lade Palm at patients initiation/request on 01/15/2020 at 10:15 AM EDT by video enabled telemedicine application and verified that I am speaking with the correct person using two identifiers. Pt MRN:  [id number] Pt DOB:  1925-05-29 Video Participants:  Evelena Leyden;  ***  Assessment/Plan:   ***1. Idiopathic Parkinsons disease. The patient has tremor, bradykinesia, rigidity and mild postural instability. -Despite the patient's assertion that the medication does not help, I have seen in the office that he is much less rigid than he was prior to taking the medication.             -Patient does think that medication is causing daytime hypersomnolence.  I am going to change him to the CR version.  We talked about compliance.  We talked about interactions with protein.  His son has never been present for this discussion before and patient is now living with his son.  He will take carbidopa/levodopa 25/100 CR, 2 tablets at 7 AM, 2 tablets at 11 AM and 1 tablet at 4 PM.  -pt living full time with son   2.   Lightheadedness and syncopal episode             -PCP monitoring BP.  On several meds.  Not been orthostatic in PCP office  Subjective   ***pt f/u for Parkinsons Disease.   Outside records that were made available to me were reviewed.  Pt denies falls.  Last visit, the patient was having many episodes of lightheadedness, and I thought it was likely orthostatic.  Asked that family to keep track of the blood pressures in various positions as he was on 2 different blood pressure medications.  I never heard back from them but did review PCP notes from not long after our last visit.  He was not orthostatic at that visit.  He just saw his primary care on April 16.  They actually had to add back HCTZ 12.5 mg for BP control.  No hallucinations.  Mood has been good.  Current movement d/o meds: carbidopa/levodopa 25/100 CR, 2 tablets at 7 AM, 2 tablets at 11 AM and 1 tablet at 4 PM. ***   Current Outpatient Medications on File Prior to Visit  Medication Sig Dispense Refill  . blood glucose meter kit and supplies KIT Dispense based on patient and insurance preference. Use up to four times daily as directed. (FOR ICD-9 250.00, 250.01). 1 each 0  . Blood Glucose Monitoring Suppl (ONE TOUCH ULTRA 2) W/DEVICE KIT Use as directed. 1 each 0  . Carbidopa-Levodopa ER (SINEMET CR) 25-100 MG tablet controlled release 2 in the AM, 2 in the afternoon, 1 in the evening 450 tablet 3  . FLUBLOK QUADRIVALENT 0.5 ML injection     . hydrochlorothiazide (MICROZIDE) 12.5 MG capsule Take 1 capsule (12.5 mg total) by mouth daily. 90 capsule  3  . Lancets (ONETOUCH ULTRASOFT) lancets Check 3 times daily. E11.9 300 each 3  . metFORMIN (GLUCOPHAGE-XR) 750 MG 24 hr tablet TAKE 1 TABLET BY MOUTH IN THE MORNING WITH BREAKFAST 90 tablet 1  . ONETOUCH ULTRA test strip Test blood sugar 3 times a day 300 each 1  . Pancrelipase, Lip-Prot-Amyl, (CREON) 12000 UNITS CPEP Take by mouth 3 (three) times daily before meals.      . polyethylene glycol (MIRALAX / GLYCOLAX) packet Take 17 g by mouth daily as needed for mild constipation.    . tamsulosin (FLOMAX) 0.4 MG CAPS capsule TAKE ONE CAPSULE BY MOUTH ONCE DAILY. 90  capsule 3  . timolol (TIMOPTIC) 0.5 % ophthalmic solution INSTILL 1 DROP INTO RIGHT EYE IN THE MORNING    . triamcinolone cream (KENALOG) 0.1 % APPLY  CREAM EXTERNALLY TO AFFECTED AREA TWICE DAILY AS NEEDED 80 g 2   No current facility-administered medications on file prior to visit.     Objective   There were no vitals filed for this visit. GEN:  The patient appears stated age and is in NAD.  Neurological examination:  Orientation: The patient is alert and oriented x3. Cranial nerves: There is good facial symmetry. There is ***facial hypomimia.  The speech is fluent and clear. Soft palate rises symmetrically and there is no tongue deviation. Hearing is intact to conversational tone. Motor: Strength is at least antigravity x 4.   Shoulder shrug is equal and symmetric.  There is no pronator drift.  Movement examination: Tone: unable Abnormal movements: *** Coordination:  There is *** decremation with RAM's, *** Gait and Station: The patient has *** difficulty arising out of a deep-seated chair without the use of the hands. The patient's stride length is ***.      Follow up Instructions      -I discussed the assessment and treatment plan with the patient. The patient was provided an opportunity to ask questions and all were answered. The patient agreed with the plan and demonstrated an understanding of the instructions.   The patient was advised to call back or seek an in-person evaluation if the symptoms worsen or if the condition fails to improve as anticipated.    Total time spent on today's visit was ***minutes, including both face-to-face time and nonface-to-face time.  Time included that spent on review of records (prior notes available to me/labs/imaging if pertinent), discussing treatment and goals, answering patient's questions and coordinating care.   Alonza Bogus, DO

## 2020-01-15 ENCOUNTER — Ambulatory Visit: Payer: PPO | Admitting: Neurology

## 2020-01-27 ENCOUNTER — Other Ambulatory Visit: Payer: Self-pay | Admitting: Family Medicine

## 2020-02-23 ENCOUNTER — Telehealth: Payer: Self-pay | Admitting: Family Medicine

## 2020-02-23 NOTE — Telephone Encounter (Signed)
tamsulosin (FLOMAX) 0.4 MG CAPS capsule  [id number] - [place], [place] - [address] Phone:  [phone]  Fax:  [phone]

## 2020-02-23 NOTE — Telephone Encounter (Signed)
The patient was originally wanting this Rx sent to [place] but he didn't have any information on that pharmacy. I told him that I can put in a request for the refill but he would have to contact the pharmacy in [place] for them to [facility] to get [facility]. I didn't realize that he had refills.

## 2020-02-23 NOTE — Telephone Encounter (Signed)
Tried to call pt phone rang for about 3 minutes never went to voice mail called to tell that medication had refills and need to  call pharmacy

## 2020-02-24 ENCOUNTER — Telehealth: Payer: Self-pay | Admitting: Family Medicine

## 2020-02-24 MED ORDER — ONETOUCH ULTRA VI STRP: ORAL_STRIP | 1 refills | Status: DC

## 2020-02-24 MED ORDER — ONETOUCH ULTRASOFT LANCETS MISC: 3 refills | Status: DC

## 2020-02-24 NOTE — Telephone Encounter (Signed)
Pt would like a refill on one touch test strips and Lancets. Send to [facility]) mail order pharmacy [phone].

## 2020-02-24 NOTE — Telephone Encounter (Signed)
Error

## 2020-02-24 NOTE — Telephone Encounter (Signed)
Test strip and lancets have been sent in

## 2020-03-03 ENCOUNTER — Ambulatory Visit: Payer: PPO | Admitting: Podiatry

## 2020-04-02 ENCOUNTER — Ambulatory Visit: Payer: PPO | Admitting: Family Medicine

## 2020-04-02 ENCOUNTER — Other Ambulatory Visit: Payer: Self-pay | Admitting: Family Medicine

## 2020-04-02 ENCOUNTER — Other Ambulatory Visit: Payer: Self-pay

## 2020-04-02 MED ORDER — HYDROCHLOROTHIAZIDE 12.5 MG PO CAPS: 12.5000 mg | ORAL_CAPSULE | Freq: Every day | ORAL | 0 refills | Status: DC

## 2020-04-02 MED ORDER — METFORMIN HCL ER 750 MG PO TB24: ORAL_TABLET | ORAL | 0 refills | Status: DC

## 2020-04-02 NOTE — Telephone Encounter (Signed)
Pt is in [place] right now and needs his medication sent to a different pharmacy listed on this request.   Medication Refill: Metformin Hydrochlorothiazide  Pharmacy:  [id number] - [place], [place] Phone:  [phone]  Fax:  [phone]

## 2020-05-05 ENCOUNTER — Ambulatory Visit (INDEPENDENT_AMBULATORY_CARE_PROVIDER_SITE_OTHER): Payer: PPO | Admitting: Family Medicine

## 2020-05-05 ENCOUNTER — Other Ambulatory Visit: Payer: Self-pay

## 2020-05-05 ENCOUNTER — Encounter: Payer: Self-pay | Admitting: Family Medicine

## 2020-05-05 VITALS — BP 116/58 | HR 85 | Temp 97.7°F | Wt 187.1 lb

## 2020-05-05 DIAGNOSIS — I10 Essential (primary) hypertension: Secondary | ICD-10-CM

## 2020-05-05 DIAGNOSIS — E11628 Type 2 diabetes mellitus with other skin complications: Secondary | ICD-10-CM | POA: Diagnosis not present

## 2020-05-05 DIAGNOSIS — E1121 Type 2 diabetes mellitus with diabetic nephropathy: Secondary | ICD-10-CM

## 2020-05-05 LAB — POCT GLYCOSYLATED HEMOGLOBIN (HGB A1C): Hemoglobin A1C: 6.4 % — AB (ref 4.0–5.6)

## 2020-05-05 NOTE — Patient Instructions (Signed)
Suspect blisters on legs are "diabetic bullae".   Keep clean with soap and water.   These usually healed uneventfully.    A1C stable at 6.4%      Let's plan on 3 month follow up.

## 2020-05-05 NOTE — Progress Notes (Signed)
Established Patient Office Visit  Subjective:  Patient ID: Steven Ward, male    DOB: Mar 22, 1925  Age: 86 y.o. MRN: [id number]  CC:  Chief Complaint  Patient presents with  . Follow-up    pt is here for 3 to 4 month follow up     HPI Steven Ward presents for medical follow-up.  He has hypertension, type 2 diabetes, chronic kidney disease, Parkinson's disease, BPH, dyslipidemia, spinal stenosis.  He has upcoming appointment with podiatry.  His diabetes has been generally well controlled.  Is on Metformin recent creatinine 1.6.  We had a long discussion today regarding Metformin with chronic kidney disease.  He has been on this medication for years and is reluctant to stop at this point.  See discussion below.  He has bullous lesions with one on the left leg and had a couple recently right leg.  These are nonpainful.  No recent change in medication.  Was on the right leg spontaneously ruptured and have healed over without difficulty.  No history of similar blisters.  No upper extremity or trunk blisters.  He has Parkinson's disease and feels his tremor is relatively stable.  He remains on Sinemet.  Does not describe any orthostatic type symptoms.  He has some occasional slow stream with his BPH but overall stable.  Chronic back pain is unchanged.  His son accompanies him today and states his appetite is excellent.  Past Medical History:  Diagnosis Date  . ABNORMAL THYROID FUNCTION TESTS 01/17/2010  . ALLERGIC RHINITIS 12/01/2008  . Colon cancer ([place])   . COLONIC POLYPS, HX OF 12/01/2008  . DIABETES MELLITUS, TYPE II 12/01/2008  . EDEMA LEG 05/04/2009  . ESSENTIAL HYPERTENSION 12/01/2008  . Exocrine pancreatic insufficiency   . GALLSTONES 04/07/2009  . GERD 12/01/2008  . Hay fever   . SPONDYLOSIS, LUMBAR 12/01/2008    Past Surgical History:  Procedure Laterality Date  . ANKLE SURGERY Right   . CATARACT EXTRACTION    . CHOLECYSTECTOMY    . COLON SURGERY     resection 1990 for  cancer  . LEG SURGERY Left     Family History  Problem Relation Age of Onset  . Cancer Other        colon  . Diabetes Other   . Stroke Other   . Cancer Mother     Social History   Socioeconomic History  . Marital status: Widowed    Spouse name: Not on file  . Number of children: 2  . Years of education: Not on file  . Highest education level: Not on file  Occupational History  . Occupation: retired    Fish farm manager: [employer]    Comment: bus driver  Tobacco Use  . Smoking status: Former Smoker    Packs/day: 1.00    Years: 20.00    Pack years: 20.00    Types: Cigarettes    Quit date: 12/28/1968    Years since quitting: 51.3  . Smokeless tobacco: Never Used  Vaping Use  . Vaping Use: Never used  Substance and Sexual Activity  . Alcohol use: No  . Drug use: No  . Sexual activity: Not on file  Other Topics Concern  . Not on file  Social History Narrative   R handed   Lives with son    Social Determinants of Health   Financial Resource Strain:   . Difficulty of Paying Living Expenses:   Food Insecurity:   . Worried About [employer] of  Food in the Last Year:   . [place] in the Last Year:   Transportation Needs:   . Film/video editor (Medical):   Marland Kitchen Lack of Transportation (Non-Medical):   Physical Activity:   . Days of Exercise per Week:   . Minutes of Exercise per Session:   Stress:   . Feeling of Stress :   Social Connections:   . Frequency of Communication with Friends and Family:   . Frequency of Social Gatherings with Friends and Family:   . Attends Religious Services:   . Active Member of Clubs or Organizations:   . Attends Archivist Meetings:   Marland Kitchen Marital Status:   Intimate Partner Violence:   . Fear of Current or Ex-Partner:   . Emotionally Abused:   Marland Kitchen Physically Abused:   . Sexually Abused:     Outpatient Medications Prior to Visit  Medication Sig Dispense Refill  . blood glucose meter kit and supplies KIT  Dispense based on patient and insurance preference. Use up to four times daily as directed. (FOR ICD-9 250.00, 250.01). 1 each 0  . Blood Glucose Monitoring Suppl (ONE TOUCH ULTRA 2) W/DEVICE KIT Use as directed. 1 each 0  . Carbidopa-Levodopa ER (SINEMET CR) 25-100 MG tablet controlled release 2 in the AM, 2 in the afternoon, 1 in the evening 450 tablet 3  . FLUBLOK QUADRIVALENT 0.5 ML injection     . glucose blood (ONETOUCH ULTRA) test strip Test blood sugar 3 times a day 300 each 1  . hydrochlorothiazide (MICROZIDE) 12.5 MG capsule Take 1 capsule (12.5 mg total) by mouth daily. 30 capsule 0  . Lancets (ONETOUCH ULTRASOFT) lancets Check 3 times daily. E11.9 300 each 3  . metFORMIN (GLUCOPHAGE-XR) 750 MG 24 hr tablet TAKE 1 TABLET BY MOUTH EVERY MORNING WITH BREAKFAST 30 tablet 0  . Pancrelipase, Lip-Prot-Amyl, (CREON) 12000 UNITS CPEP Take by mouth 3 (three) times daily before meals.      . polyethylene glycol (MIRALAX / GLYCOLAX) packet Take 17 g by mouth daily as needed for mild constipation.    . tamsulosin (FLOMAX) 0.4 MG CAPS capsule TAKE ONE CAPSULE BY MOUTH ONCE DAILY. 90 capsule 3  . timolol (TIMOPTIC) 0.5 % ophthalmic solution INSTILL 1 DROP INTO RIGHT EYE IN THE MORNING    . triamcinolone cream (KENALOG) 0.1 % APPLY  CREAM EXTERNALLY TO AFFECTED AREA TWICE DAILY AS NEEDED 80 g 2   No facility-administered medications prior to visit.    Allergies  Allergen Reactions  . Amoxicillin     REACTION: rash, dizziness    ROS Review of Systems  Constitutional: Negative for appetite change, chills and fever.  Respiratory: Negative for cough and shortness of breath.   Cardiovascular: Positive for leg swelling. Negative for chest pain and palpitations.  Gastrointestinal: Negative for abdominal pain.  Genitourinary: Negative for dysuria.  Neurological: Negative for dizziness, syncope and light-headedness.  Psychiatric/Behavioral: Negative for confusion.      Objective:    Physical  Exam Vitals reviewed.  Cardiovascular:     Rate and Rhythm: Normal rate.  Pulmonary:     Effort: Pulmonary effort is normal.     Breath sounds: Normal breath sounds.  Musculoskeletal:     Comments: He has trace edema legs bilaterally.  Skin:    Comments: He has bullous lesion left anterior leg which is approximately 2 x 3 cm.  No surrounding erythema.  Neurological:     Mental Status: He is alert.  BP (!) 116/58 (BP Location: Left Arm, Patient Position: Sitting, Cuff Size: Normal)   Pulse 85   Temp 97.7 F (36.5 C) (Oral)   Wt 187 lb 1.6 oz (84.9 kg)   SpO2 96%   BMI 28.45 kg/m  Wt Readings from Last 3 Encounters:  05/05/20 187 lb 1.6 oz (84.9 kg)  01/02/20 192 lb 4.8 oz (87.2 kg)  10/03/19 183 lb 9.6 oz (83.3 kg)     Health Maintenance Due  Topic Date Due  . PNA vac Low Risk Adult (2 of 2 - PPSV23) 07/28/2015  . OPHTHALMOLOGY EXAM  04/24/2018  . INFLUENZA VACCINE  04/18/2020    There are no preventive care reminders to display for this patient.  Lab Results  Component Value Date   TSH 4.68 (H) 08/05/2019   Lab Results  Component Value Date   WBC 8.6 04/19/2016   HGB 13.6 04/19/2016   HCT 40.9 04/19/2016   MCV 94.2 04/19/2016   PLT 205.0 04/19/2016   Lab Results  Component Value Date   NA 143 01/02/2020   K 3.9 01/02/2020   CO2 25 01/02/2020   GLUCOSE 117 (H) 01/02/2020   BUN 33 (H) 01/02/2020   CREATININE 1.61 (H) 01/02/2020   BILITOT 0.6 04/02/2019   ALKPHOS 73 04/02/2019   AST 11 04/02/2019   ALT 4 04/02/2019   PROT 6.6 04/02/2019   ALBUMIN 3.9 04/02/2019   CALCIUM 8.7 01/02/2020   GFR 40.26 (L) 01/02/2020   Lab Results  Component Value Date   CHOL 140 04/02/2019   Lab Results  Component Value Date   HDL 39.20 04/02/2019   Lab Results  Component Value Date   LDLCALC 86 04/02/2019   Lab Results  Component Value Date   TRIG 76.0 04/02/2019   Lab Results  Component Value Date   CHOLHDL 4 04/02/2019   Lab Results  Component  Value Date   HGBA1C 6.4 (A) 05/05/2020      Assessment & Plan:   #1 type 2 diabetes.  Well-controlled with A1c 6.4%.  He is on Metformin.  Has been on this for many years.  He has chronic kidney disease with creatinine 1.6 which is been very stable.  We did discuss increased risk of lactic acidosis with chronic kidney disease and his age.  He is reluctant to stop at this time.  Recheck renal function at follow-up and if this is increasing we will need to stop Metformin and look at other options.  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 -Keep clean with soap and water.  We explained these usually heal uneventfully.  #3 hypertension stable.  No recent orthostatic symptoms.  We have gradually reduced his medications as he has gotten older and blood pressures have declined on Sinemet  #4 health maintenance.  Covid vaccine has been given  No orders of the defined types were placed in this encounter.   Follow-up: No follow-ups on file.    Carolann Littler, MD

## 2020-05-19 ENCOUNTER — Other Ambulatory Visit: Payer: Self-pay

## 2020-05-19 ENCOUNTER — Encounter: Payer: Self-pay | Admitting: Podiatry

## 2020-05-19 ENCOUNTER — Ambulatory Visit: Payer: PPO | Admitting: Podiatry

## 2020-05-19 DIAGNOSIS — M79676 Pain in unspecified toe(s): Secondary | ICD-10-CM | POA: Diagnosis not present

## 2020-05-19 DIAGNOSIS — E119 Type 2 diabetes mellitus without complications: Secondary | ICD-10-CM | POA: Diagnosis not present

## 2020-05-19 DIAGNOSIS — B351 Tinea unguium: Secondary | ICD-10-CM | POA: Diagnosis not present

## 2020-05-19 NOTE — Progress Notes (Signed)
This patient returns to my office for at risk foot care.  This patient requires this care by a professional since this patient will be at risk due to having  Diabetes and chronic kidney disease.  This patient is unable to cut nails himself since the patient cannot reach his nails.These nails are painful walking and wearing shoes.  This patient presents for at risk foot care today.  General Appearance  Alert, conversant and in no acute stress.  Vascular  Dorsalis pedis and posterior tibial  pulses are palpable  bilaterally.  Capillary return is within normal limits  bilaterally. Temperature is within normal limits  bilaterally.  Neurologic  Senn-Weinstein monofilament wire test within normal limits  bilaterally. Muscle power within normal limits bilaterally.  Nails Thick disfigured discolored nails with subungual debris  from hallux to fifth toes bilaterally. No evidence of bacterial infection or drainage bilaterally.  Orthopedic  No limitations of motion  feet .  No crepitus or effusions noted.  No bony pathology or digital deformities noted.  Skin  normotropic skin with no porokeratosis noted bilaterally.  No signs of infections or ulcers noted.     Onychomycosis  Pain in right toes  Pain in left toes  Consent was obtained for treatment procedures.   Mechanical debridement of nails 1-5  bilaterally performed with a nail nipper.  Filed with dremel without incident.    Return office visit     10 weeks                 Told patient to return for periodic foot care and evaluation due to potential at risk complications.   Gardiner Barefoot DPM

## 2020-06-09 ENCOUNTER — Encounter: Payer: Self-pay | Admitting: Gastroenterology

## 2020-07-21 ENCOUNTER — Telehealth: Payer: Self-pay

## 2020-07-21 ENCOUNTER — Other Ambulatory Visit: Payer: Self-pay

## 2020-07-21 MED ORDER — ONETOUCH ULTRA VI STRP: ORAL_STRIP | 1 refills | Status: DC

## 2020-07-21 NOTE — Telephone Encounter (Signed)
Sent pt rx for one touch test to [facility] per pt request.

## 2020-07-21 NOTE — Telephone Encounter (Signed)
Patient called in needing this called into the pharmacy    glucose blood (ONETOUCH ULTRA) test strip   [facility] - [place], [place]

## 2020-08-16 ENCOUNTER — Ambulatory Visit: Payer: PPO | Admitting: Gastroenterology

## 2020-08-26 ENCOUNTER — Telehealth: Payer: Self-pay | Admitting: Family Medicine

## 2020-08-26 NOTE — Telephone Encounter (Signed)
Tried call patient  to schedule Medicare Annual Wellness Visit (AWV) either virtually or in office.  No answer   Last AWV 05/10/17  please schedule at anytime with [facility] Nurse Health Advisor 1 or 2   This should be a 45 minute visit.

## 2020-08-31 ENCOUNTER — Ambulatory Visit: Payer: PPO | Admitting: Podiatry

## 2020-09-01 ENCOUNTER — Ambulatory Visit: Payer: PPO | Admitting: Family Medicine

## 2020-09-02 ENCOUNTER — Telehealth: Payer: Self-pay | Admitting: Family Medicine

## 2020-09-02 NOTE — Progress Notes (Signed)
  Chronic Care Management   Outreach Note  09/02/2020 Name: Steven Ward MRN: [id number] DOB: Dec 29, 1924  Referred by: [name], MD Reason for referral : No chief complaint on file.   An unsuccessful telephone outreach was attempted today. The patient was referred to the pharmacist for assistance with care management and care coordination.   Follow Up Plan:   Carley Perdue [employer] Scheduler

## 2020-09-07 ENCOUNTER — Telehealth: Payer: Self-pay | Admitting: Family Medicine

## 2020-09-07 NOTE — Progress Notes (Signed)
  Chronic Care Management   Outreach Note  09/07/2020 Name: Steven Ward MRN: [id number] DOB: 1925/06/26  Referred by: [name], MD Reason for referral : No chief complaint on file.   A second unsuccessful telephone outreach was attempted today. The patient was referred to pharmacist for assistance with care management and care coordination.  Follow Up Plan:   Carley Perdue [employer] Scheduler

## 2020-09-22 ENCOUNTER — Other Ambulatory Visit: Payer: Self-pay | Admitting: Family Medicine

## 2020-10-05 ENCOUNTER — Ambulatory Visit: Payer: PPO | Admitting: Family Medicine

## 2020-10-12 ENCOUNTER — Ambulatory Visit (INDEPENDENT_AMBULATORY_CARE_PROVIDER_SITE_OTHER): Payer: Medicare PPO | Admitting: Family Medicine

## 2020-10-12 ENCOUNTER — Other Ambulatory Visit: Payer: Self-pay

## 2020-10-12 ENCOUNTER — Encounter: Payer: Self-pay | Admitting: Family Medicine

## 2020-10-12 VITALS — BP 118/70 | HR 85 | Ht 68.0 in | Wt 175.0 lb

## 2020-10-12 DIAGNOSIS — E1121 Type 2 diabetes mellitus with diabetic nephropathy: Secondary | ICD-10-CM

## 2020-10-12 DIAGNOSIS — N183 Chronic kidney disease, stage 3 unspecified: Secondary | ICD-10-CM | POA: Diagnosis not present

## 2020-10-12 DIAGNOSIS — I10 Essential (primary) hypertension: Secondary | ICD-10-CM

## 2020-10-12 DIAGNOSIS — E1122 Type 2 diabetes mellitus with diabetic chronic kidney disease: Secondary | ICD-10-CM

## 2020-10-12 LAB — BASIC METABOLIC PANEL
BUN: 45 mg/dL — ABNORMAL HIGH (ref 6–23)
CO2: 26 mEq/L (ref 19–32)
Calcium: 9.3 mg/dL (ref 8.4–10.5)
Chloride: 103 mEq/L (ref 96–112)
Creatinine, Ser: 1.94 mg/dL — ABNORMAL HIGH (ref 0.40–1.50)
GFR: 29.25 mL/min — ABNORMAL LOW (ref >60.00)
Glucose, Bld: 134 mg/dL — ABNORMAL HIGH (ref 70–99)
Potassium: 4 mEq/L (ref 3.5–5.1)
Sodium: 137 mEq/L (ref 135–145)

## 2020-10-12 LAB — POCT GLYCOSYLATED HEMOGLOBIN (HGB A1C): Hemoglobin A1C: 6.2 % — AB (ref 4.0–5.6)

## 2020-10-12 NOTE — Progress Notes (Signed)
 Established Patient Office Visit  Subjective:  Patient ID: Steven Ward, male    DOB: 08/24/1925  Age: 87 y.o. MRN: [id number]  CC:  Chief Complaint  Patient presents with  . Diabetes    HPI Steven Ward presents for medical follow-up.  He has history of hypertension, type 2 diabetes, chronic kidney disease stage III, Parkinson's disease, spinal stenosis.  Progressive disability over the past several years.  He does take Metformin and we have to be careful to watch his renal function.  He has used glimepiride in the past.  Have some mild peripheral edema which usually goes away with foot elevation.  Pancreatic exocrine insufficiency and remains on Creon.    Remains on Sinemet for his tremor.  Has been treated previously for high blood pressure but blood pressure currently stable off medications other than HCTZ.  Needs follow-up electrolytes and renal function  Past Medical History:  Diagnosis Date  . ABNORMAL THYROID FUNCTION TESTS 01/17/2010  . ALLERGIC RHINITIS 12/01/2008  . Colon cancer (HCC)   . COLONIC POLYPS, HX OF 12/01/2008  . DIABETES MELLITUS, TYPE II 12/01/2008  . EDEMA LEG 05/04/2009  . ESSENTIAL HYPERTENSION 12/01/2008  . Exocrine pancreatic insufficiency   . GALLSTONES 04/07/2009  . GERD 12/01/2008  . Hay fever   . SPONDYLOSIS, LUMBAR 12/01/2008    Past Surgical History:  Procedure Laterality Date  . ANKLE SURGERY Right   . CATARACT EXTRACTION    . CHOLECYSTECTOMY    . COLON SURGERY     resection 1990 for cancer  . LEG SURGERY Left     Family History  Problem Relation Age of Onset  . Cancer Other        colon  . Diabetes Other   . Stroke Other   . Cancer Mother     Social History   Socioeconomic History  . Marital status: Widowed    Spouse name: Not on file  . Number of children: 2  . Years of education: Not on file  . Highest education level: Not on file  Occupational History  . Occupation: retired    Employer: [employer]     Comment: bus driver  Tobacco Use  . Smoking status: Former Smoker    Packs/day: 1.00    Years: 20.00    Pack years: 20.00    Types: Cigarettes    Quit date: 12/28/1968    Years since quitting: 51.8  . Smokeless tobacco: Never Used  Vaping Use  . Vaping Use: Never used  Substance and Sexual Activity  . Alcohol use: No  . Drug use: No  . Sexual activity: Not on file  Other Topics Concern  . Not on file  Social History Narrative   R handed   Lives with son    Social Determinants of Health   Financial Resource Strain: Not on file  Food Insecurity: Not on file  Transportation Needs: Not on file  Physical Activity: Not on file  Stress: Not on file  Social Connections: Not on file  Intimate Partner Violence: Not on file    Outpatient Medications Prior to Visit  Medication Sig Dispense Refill  . blood glucose meter kit and supplies KIT Dispense based on patient and insurance preference. Use up to four times daily as directed. (FOR ICD-9 250.00, 250.01). 1 each 0  . Blood Glucose Monitoring Suppl (ONE TOUCH ULTRA 2) W/DEVICE KIT Use as directed. 1 each 0  . Carbidopa-Levodopa ER (SINEMET CR) 25-100 MG tablet controlled   release 2 in the AM, 2 in the afternoon, 1 in the evening 450 tablet 3  . glucose blood (ONETOUCH ULTRA) test strip Test blood sugar 3 times a day 300 each 1  . hydrochlorothiazide (MICROZIDE) 12.5 MG capsule TAKE 1 CAPSULE BY MOUTH EVERY DAY 90 capsule 1  . Lancets (ONETOUCH ULTRASOFT) lancets Check 3 times daily. E11.9 300 each 3  . lipase/protease/amylase (CREON) 12000-38000 units CPEP capsule Take by mouth 3 (three) times daily before meals.    . metFORMIN (GLUCOPHAGE-XR) 750 MG 24 hr tablet TAKE 1 TABLET BY MOUTH EVERY MORNING WITH BREAKFAST 30 tablet 0  . polyethylene glycol (MIRALAX / GLYCOLAX) packet Take 17 g by mouth daily as needed for mild constipation.    . tamsulosin (FLOMAX) 0.4 MG CAPS capsule TAKE ONE CAPSULE BY MOUTH ONCE DAILY. 90 capsule 3  .  timolol (TIMOPTIC) 0.5 % ophthalmic solution INSTILL 1 DROP INTO RIGHT EYE IN THE MORNING    . triamcinolone cream (KENALOG) 0.1 % APPLY  CREAM EXTERNALLY TO AFFECTED AREA TWICE DAILY AS NEEDED 80 g 2  . FLUBLOK QUADRIVALENT 0.5 ML injection      No facility-administered medications prior to visit.    Allergies  Allergen Reactions  . Amoxicillin     REACTION: rash, dizziness    ROS Review of Systems  Constitutional: Negative for chills and fever.  Respiratory: Negative for cough.   Cardiovascular: Negative for chest pain and palpitations.  Gastrointestinal: Positive for constipation. Negative for abdominal pain.  Genitourinary: Negative for dysuria.  Neurological: Positive for tremors. Negative for syncope.  Psychiatric/Behavioral: Negative for agitation.      Objective:    Physical Exam Vitals reviewed.  Constitutional:      Appearance: Normal appearance.  Cardiovascular:     Rate and Rhythm: Normal rate.  Pulmonary:     Effort: Pulmonary effort is normal.     Breath sounds: Normal breath sounds.  Musculoskeletal:     Comments: Does have some pitting edema confined to ankle and lower legs bilaterally.    Neurological:     Mental Status: He is alert.     BP 118/70   Pulse 85   Ht 5' 8" (1.727 m)   Wt 175 lb (79.4 kg)   SpO2 98%   BMI 26.61 kg/m  Wt Readings from Last 3 Encounters:  10/12/20 175 lb (79.4 kg)  05/05/20 187 lb 1.6 oz (84.9 kg)  01/02/20 192 lb 4.8 oz (87.2 kg)     Health Maintenance Due  Topic Date Due  . PNA vac Low Risk Adult (2 of 2 - PPSV23) 07/28/2015  . OPHTHALMOLOGY EXAM  04/24/2018  . FOOT EXAM  09/16/2020    There are no preventive care reminders to display for this patient.  Lab Results  Component Value Date   TSH 4.68 (H) 08/05/2019   Lab Results  Component Value Date   WBC 8.6 04/19/2016   HGB 13.6 04/19/2016   HCT 40.9 04/19/2016   MCV 94.2 04/19/2016   PLT 205.0 04/19/2016   Lab Results  Component Value Date    NA 143 01/02/2020   K 3.9 01/02/2020   CO2 25 01/02/2020   GLUCOSE 117 (H) 01/02/2020   BUN 33 (H) 01/02/2020   CREATININE 1.61 (H) 01/02/2020   BILITOT 0.6 04/02/2019   ALKPHOS 73 04/02/2019   AST 11 04/02/2019   ALT 4 04/02/2019   PROT 6.6 04/02/2019   ALBUMIN 3.9 04/02/2019   CALCIUM 8.7 01/02/2020   GFR 40.26 (  L) 01/02/2020   Lab Results  Component Value Date   CHOL 140 04/02/2019   Lab Results  Component Value Date   HDL 39.20 04/02/2019   Lab Results  Component Value Date   LDLCALC 86 04/02/2019   Lab Results  Component Value Date   TRIG 76.0 04/02/2019   Lab Results  Component Value Date   CHOLHDL 4 04/02/2019   Lab Results  Component Value Date   HGBA1C 6.2 (A) 10/12/2020      Assessment & Plan:   Problem List Items Addressed This Visit      Unprioritized   CKD stage 3 due to type 2 diabetes mellitus ([place])   Relevant Orders   Basic metabolic panel   Type 2 diabetes mellitus, controlled ([place]) - Primary   Relevant Orders   POCT glycosylated hemoglobin (Hb A1C) (Completed)   Microalbumin / creatinine urine ratio     -Check basic metabolic panel -Consider discontinuation of Metformin especially if GFR around 30 or less. -set up 4 month follow up.  No orders of the defined types were placed in this encounter.   Follow-up: Return in about 4 months (around 02/09/2021).    Carolann Littler, MD

## 2020-10-13 ENCOUNTER — Telehealth: Payer: Self-pay

## 2020-10-13 MED ORDER — GLYBURIDE 2.5 MG PO TABS: 2.5000 mg | ORAL_TABLET | Freq: Every day | ORAL | 3 refills | Status: DC

## 2020-10-13 NOTE — Telephone Encounter (Signed)
-----   Message from [name], MD sent at 10/12/2020  7:51 PM EST ----- Renal function has declined somewhat with GFR just < 30.  Have him STOP the Metformin and take off med list Start back Glyburide 2.5 mg one po daily with breakfast and make sure he has 3 month follow up.

## 2020-10-13 NOTE — Telephone Encounter (Signed)
Informed patient of results and recommendations.  Medication list updated.  Follow up appointment scheduled.

## 2020-10-17 ENCOUNTER — Other Ambulatory Visit: Payer: Self-pay | Admitting: Family Medicine

## 2020-10-22 ENCOUNTER — Ambulatory Visit: Payer: PPO | Admitting: Podiatry

## 2020-11-01 ENCOUNTER — Other Ambulatory Visit: Payer: Self-pay

## 2020-11-01 ENCOUNTER — Telehealth: Payer: Self-pay | Admitting: Family Medicine

## 2020-11-01 MED ORDER — LANCET DEVICE MISC: 3 refills | Status: AC

## 2020-11-01 MED ORDER — LANCET DEVICE MISC: 3 refills | Status: DC

## 2020-11-01 MED ORDER — ONETOUCH ULTRASOFT LANCETS MISC: 3 refills | Status: DC

## 2020-11-01 NOTE — Telephone Encounter (Signed)
Lancets refill  sent

## 2020-11-01 NOTE — Telephone Encounter (Signed)
Steven Ward pt stated the name of the pharmacy is [facility] and the # is [phone].

## 2020-11-04 ENCOUNTER — Telehealth: Payer: Self-pay | Admitting: Family Medicine

## 2020-11-04 NOTE — Progress Notes (Signed)
  Chronic Care Management   Outreach Note  11/04/2020 Name: Steven Ward MRN: [id number] DOB: [birth year]  Referred by: [name], MD Reason for referral : No chief complaint on file.   An unsuccessful telephone outreach was attempted today. The patient was referred to the pharmacist for assistance with care management and care coordination.   Follow Up Plan:   Carley Perdue [facility] Scheduler

## 2020-11-05 ENCOUNTER — Other Ambulatory Visit: Payer: Self-pay | Admitting: Family Medicine

## 2020-11-05 NOTE — Telephone Encounter (Signed)
He sees neurology and this prescription needs to get from them.

## 2020-11-11 ENCOUNTER — Telehealth: Payer: Self-pay | Admitting: Family Medicine

## 2020-11-11 NOTE — Progress Notes (Signed)
  Chronic Care Management   Note  11/11/2020 Name: Steven Ward MRN: [id number] DOB: March 14, 1925  [name] is a 87 y.o. year old male who is a primary care patient of Burchette, Alinda Sierras, MD. I reached out to Evelena Leyden by phone today in response to a referral sent by [name]'s PCP, Elease Hashimoto, Alinda Sierras, MD.   [name] was given information about Chronic Care Management services today including:  1. CCM service includes personalized support from designated clinical staff supervised by his physician, including individualized plan of care and coordination with other care providers 2. 24/7 contact phone numbers for assistance for urgent and routine care needs. 3. Service will only be billed when office clinical staff spend 20 minutes or more in a month to coordinate care. 4. Only one practitioner may furnish and bill the service in a calendar month. 5. The patient may stop CCM services at any time (effective at the end of the month) by phone call to the office staff.   Patient agreed to services and verbal consent obtained.   Follow up plan:   Carley Perdue [facility] Scheduler

## 2020-11-11 NOTE — Progress Notes (Signed)
°  Chronic Care Management   Outreach Note  11/11/2020 Name: KARTIK FERNANDO MRN: [id number] DOB: Feb 16, 1925  Referred by: [name], MD Reason for referral : No chief complaint on file.   Third unsuccessful telephone outreach was attempted today. The patient was referred to the pharmacist for assistance with care management and care coordination.   Follow Up Plan:   Carley Perdue [employer] Scheduler

## 2020-11-19 ENCOUNTER — Telehealth: Payer: Self-pay | Admitting: Family Medicine

## 2020-11-19 NOTE — Telephone Encounter (Signed)
Left message for patient to call back and schedule Medicare Annual Wellness Visit (AWV) either virtually or in office. No detailed message left   Last AWV 05/10/17 please schedule at anytime with [facility] Nurse Health Advisor 1 or 2   This should be a 45 minute visit.

## 2020-11-22 ENCOUNTER — Other Ambulatory Visit: Payer: Self-pay

## 2020-11-22 ENCOUNTER — Other Ambulatory Visit: Payer: Self-pay | Admitting: Family Medicine

## 2020-11-22 ENCOUNTER — Telehealth: Payer: Self-pay | Admitting: Family Medicine

## 2020-11-22 MED ORDER — ONETOUCH ULTRASOFT LANCETS MISC: 3 refills | Status: DC

## 2020-11-22 MED ORDER — ONETOUCH ULTRA VI STRP: ORAL_STRIP | 1 refills | Status: DC

## 2020-11-22 MED ORDER — TAMSULOSIN HCL 0.4 MG PO CAPS: ORAL_CAPSULE | ORAL | 1 refills | Status: DC

## 2020-11-22 MED ORDER — CARBIDOPA-LEVODOPA ER 25-100 MG PO TBCR: EXTENDED_RELEASE_TABLET | ORAL | 1 refills | Status: DC

## 2020-11-22 NOTE — Telephone Encounter (Signed)
Sent to the pharmacy

## 2020-11-22 NOTE — Telephone Encounter (Signed)
glucose blood (ONETOUCH ULTRA) test strip  Lancet Device MISC  Lancets (ONETOUCH ULTRASOFT) lancets  [facility] ([place], [place] Phone:  [phone]  Fax:  [phone]     The contact number for [facility] is: [phone]

## 2020-11-23 ENCOUNTER — Telehealth: Payer: Self-pay | Admitting: Family Medicine

## 2020-11-23 NOTE — Progress Notes (Signed)
  Chronic Care Management   Note  11/23/2020 Name: Steven Ward MRN: [id number] DOB: 1925/04/14  Steven Ward is a 87 y.o. year old male who is a primary care patient of Burchette, Alinda Sierras, MD. I reached out to Evelena Leyden by phone today in response to a referral sent by Steven Ward's PCP, Elease Hashimoto, Alinda Sierras, MD.   Steven Ward was given information about Chronic Care Management services today including:  1. CCM service includes personalized support from designated clinical staff supervised by his physician, including individualized plan of care and coordination with other care providers 2. 24/7 contact phone numbers for assistance for urgent and routine care needs. 3. Service will only be billed when office clinical staff spend 20 minutes or more in a month to coordinate care. 4. Only one practitioner may furnish and bill the service in a calendar month. 5. The patient may stop CCM services at any time (effective at the end of the month) by phone call to the office staff.   Patient agreed to services and verbal consent obtained.   Follow up plan:   Carley Perdue [employer] Scheduler

## 2020-11-30 ENCOUNTER — Telehealth: Payer: Self-pay | Admitting: Family Medicine

## 2020-11-30 MED ORDER — ONETOUCH ULTRASOFT LANCETS MISC: 3 refills | Status: AC

## 2020-11-30 MED ORDER — ONETOUCH ULTRA VI STRP: ORAL_STRIP | 1 refills | Status: DC

## 2020-11-30 NOTE — Telephone Encounter (Signed)
Pts pharmacy [facility]) is calling in stating that the pt is needing Rx glucose blood (ONTOUCH ULTRA) test strip and lancets (ONETOUCH ULTRASOFT) lancets to be sent to [facility] (pt is out).  Pt had called and had given the incorrect pharmacy and would like for the one that was sent to be cancelled and resent to the correct pharmacy.  Pharm:  [facility]

## 2020-11-30 NOTE — Telephone Encounter (Signed)
[facility] Pharmacies information is (P) [phone] and (F) [phone].

## 2020-11-30 NOTE — Telephone Encounter (Signed)
Rx's sent into [facility]. Left pt a voicemail letting him know that the Rx's were sent in.

## 2020-11-30 NOTE — Telephone Encounter (Signed)
Pt requesting a refill by mail order   [facility] ([place], [place]  Phone:  [phone] Fax:  [phone]  Lancet Device MISC  Lancets [facility] ULTRASOFT) lancets And pt is requesting the test strips  Would like call to inform when this is sent .

## 2020-12-07 ENCOUNTER — Ambulatory Visit: Payer: Medicare PPO | Admitting: Podiatry

## 2020-12-15 ENCOUNTER — Telehealth: Payer: Self-pay | Admitting: Family Medicine

## 2020-12-15 NOTE — Telephone Encounter (Signed)
Marianna w/[facility] is calling looking for a Diabetic testing Supply form that was sent twice and they would like for it faxed back to [phone].  If it has not been received they would like to have a call back [phone].

## 2020-12-15 NOTE — Telephone Encounter (Signed)
Form has been faxed back to the requested fax number. Nothing further needed.

## 2020-12-17 NOTE — Telephone Encounter (Signed)
Cheri w/[facility] is calling to see if the form can be re-faxed to [phone] due to them not have received it.  And if you can please call to let them know that it has been sent [phone].

## 2020-12-17 NOTE — Telephone Encounter (Signed)
Forms have been re-faxed. Atempted to call and the wait time was 45 minutes. Will try back later

## 2020-12-20 NOTE — Telephone Encounter (Signed)
Attempted to call the number below and the wait time was 35 minutes. Will call back.

## 2020-12-21 ENCOUNTER — Ambulatory Visit: Payer: Medicare PPO | Admitting: Podiatry

## 2020-12-21 ENCOUNTER — Encounter: Payer: Self-pay | Admitting: Podiatry

## 2020-12-21 ENCOUNTER — Other Ambulatory Visit: Payer: Self-pay

## 2020-12-21 DIAGNOSIS — E119 Type 2 diabetes mellitus without complications: Secondary | ICD-10-CM | POA: Diagnosis not present

## 2020-12-21 DIAGNOSIS — B351 Tinea unguium: Secondary | ICD-10-CM | POA: Diagnosis not present

## 2020-12-21 DIAGNOSIS — M79676 Pain in unspecified toe(s): Secondary | ICD-10-CM | POA: Diagnosis not present

## 2020-12-21 NOTE — Progress Notes (Signed)
This patient returns to my office for at risk foot care.  This patient requires this care by a professional since this patient will be at risk due to having  Diabetes and chronic kidney disease.  This patient is unable to cut nails himself since the patient cannot reach his nails.These nails are painful walking and wearing shoes.  This patient presents for at risk foot care today.  General Appearance  Alert, conversant and in no acute stress.  Vascular  Dorsalis pedis and posterior tibial  pulses are weakly palpable  bilaterally.  Capillary return is within normal limits  bilaterally. Cold feet   Bilaterally.  Absent digital hair  B/L.  Neurologic  Senn-Weinstein monofilament wire test within normal limits  bilaterally. Muscle power within normal limits bilaterally.  Nails Thick disfigured discolored nails with subungual debris  from hallux to fifth toes bilaterally. No evidence of bacterial infection or drainage bilaterally.  Orthopedic  No limitations of motion  feet .  No crepitus or effusions noted.  No bony pathology or digital deformities noted.  Skin  normotropic skin with no porokeratosis noted bilaterally.  No signs of infections or ulcers noted.     Onychomycosis  Pain in right toes  Pain in left toes  Consent was obtained for treatment procedures.   Mechanical debridement of nails 1-5  bilaterally performed with a nail nipper.  Filed with dremel without incident.  Iatrogenic lesion bandaged with  DSD.   Return office visit     3 months                  Told patient to return for periodic foot care and evaluation due to potential at risk complications.   Gardiner Barefoot DPM

## 2020-12-21 NOTE — Telephone Encounter (Signed)
Atempted to call. Wait time was 55 minutes. Unable to reach anyone at the call back number. Message will be closed.

## 2021-01-07 ENCOUNTER — Telehealth: Payer: Self-pay | Admitting: Family Medicine

## 2021-01-07 NOTE — Telephone Encounter (Signed)
Patient is calling and wanted to see if provider can sent a script for a new one touch meter to be sent to Steven Ward. CB is [phone]

## 2021-01-07 NOTE — Telephone Encounter (Signed)
Left a message at the patients cell number to return a call with detailed info in addition to the name of the One Touch glucometer as there are different types and this can be sent to the pharmacy.

## 2021-01-10 NOTE — Telephone Encounter (Signed)
Left message for patient to call back  

## 2021-01-11 NOTE — Telephone Encounter (Signed)
Left message for patient to call back  

## 2021-01-12 NOTE — Telephone Encounter (Signed)
Left message for patient to call back. Unable to reach the patient. Message will be closed.

## 2021-01-17 ENCOUNTER — Telehealth: Payer: Self-pay | Admitting: Pharmacist

## 2021-01-17 NOTE — Chronic Care Management (AMB) (Signed)
Chronic Care Management Pharmacy Assistant   Name: Steven Ward  MRN: [id number] DOB: 29-Sep-1924  Reason for Encounter: Chart Prep for CPP  Visit on 01/19/2021   Conditions to be addressed/monitored: HTN, DMII and GERD  Primary concerns for visit include: Hypertension and DM type 2  Recent office visits:  . 01.26. 2022 Phone Call- Lab results o Stopped Metformin and Started back Glyburide 2.5 mg one daily with breakfast . 01.25.2022 Burchette, Alinda Sierras, MD Controlled type 2 diabetes mellitus with diabetic nephropathy, without long-term current use of insulin . Lab's order- Hgb A1C and BMET  Recent consult visits:  . 04.05.2022 Gardiner Barefoot, DPM Podiatry Pain due to onychomycosis of toenail . 12.06.2021 [facility] visits:  None in previous 6 months   Patient Questions:   1. Have you seen any other providers since your last visit?  a. Gardiner Barefoot, DPM Podiatry 2. Any changes in your medications or health? No 3. Any side effects from any medications? No 4. Any concerns about your health right now? No 5. Has your provider asked that you check blood pressure, blood sugar, or follow special diet at home? No 6. Do you get any type of exercise on a regular basis?  a. Walking sometimes 7. Can you think of a goal you would like to reach for your health?  a. Less back pain 8. Do you have any problems getting your medications? No 9. Is there anything that you would like to discuss during the appointment? No  The patient was asked to please bring medications, blood pressure/ blood sugar log, and supplements to his appointment.  Medication Dispensed  Quantity  Hydrochlorothiazide (MICROZIDE) 12.5 MG capsule 10.11.2021 90  Lipase/protease/amylase (CREON) 12000-38000 units CPEP capsule    Polyethylene glycol (MIRALAX / GLYCOLAX) packet    Timolol (TIMOPTIC) 0.5 % ophthalmic solution 06.29.20 5Ml  triamcinolone cream  (KENALOG) 0.1 03.12.21 80g  tamsulosin (FLOMAX) 0.4 MG CAPS capsule 12.08.21 90  Carbidopa-Levodopa ER (SINEMET CR) 25-100 MG tablet-controlled release 11.19.21 450  Glucose blood (ONETOUCH ULTRA) test strip     Medications: Outpatient Encounter Medications as of 01/17/2021  Medication Sig  . aspirin 81 MG EC tablet Take 1 tablet by mouth daily.  . blood glucose meter kit and supplies KIT Dispense based on patient and insurance preference. Use up to four times daily as directed. (FOR ICD-9 250.00, 250.01).  . Blood Glucose Monitoring Suppl (ONE TOUCH ULTRA 2) W/DEVICE KIT Use as directed.  . Carbidopa-Levodopa ER (SINEMET CR) 25-100 MG tablet controlled release 2 in the AM, 2 in the afternoon, 1 in the evening  . glucose blood (ONETOUCH ULTRA) test strip Test blood sugar 3 times a day  . glyBURIDE (DIABETA) 2.5 MG tablet Take 1 tablet (2.5 mg total) by mouth daily with breakfast.  . hydrochlorothiazide (MICROZIDE) 12.5 MG capsule TAKE 1 CAPSULE BY MOUTH EVERY DAY  . Lancet Device MISC Use 1-4 times daily as needed or directed.  DX E11.9  . Lancets (ONETOUCH ULTRASOFT) lancets Check 3 times daily. E11.9  . lipase/protease/amylase (CREON) 12000-38000 units CPEP capsule Take by mouth 3 (three) times daily before meals.  . metFORMIN (GLUCOPHAGE-XR) 750 MG 24 hr tablet TAKE 1 TABLET BY MOUTH EVERY MORNING WITH BREAKFAST  . polyethylene glycol (MIRALAX / GLYCOLAX) packet Take 17 g by mouth daily as needed for mild constipation.  . sitaGLIPtin (JANUVIA) 100 MG tablet Take 1 tablet by mouth daily.  . tamsulosin (FLOMAX) 0.4  MG CAPS capsule TAKE ONE CAPSULE BY MOUTH ONCE DAILY.  Marland Kitchen timolol (TIMOPTIC) 0.5 % ophthalmic solution INSTILL 1 DROP INTO RIGHT EYE IN THE MORNING  . triamcinolone cream (KENALOG) 0.1 % APPLY  CREAM EXTERNALLY TO AFFECTED AREA TWICE DAILY AS NEEDED   No facility-administered encounter medications on file as of 01/17/2021.    Star Rating Drugs:  Dispensed Quantity Pharmacy   Metformin HCL ER 750 mg 11.03.2021 [facility]   [address] [place], [place] [phone]

## 2021-01-19 ENCOUNTER — Ambulatory Visit (INDEPENDENT_AMBULATORY_CARE_PROVIDER_SITE_OTHER): Payer: Medicare PPO | Admitting: Pharmacist

## 2021-01-19 DIAGNOSIS — I10 Essential (primary) hypertension: Secondary | ICD-10-CM

## 2021-01-19 DIAGNOSIS — E1121 Type 2 diabetes mellitus with diabetic nephropathy: Secondary | ICD-10-CM | POA: Diagnosis not present

## 2021-01-19 DIAGNOSIS — N183 Chronic kidney disease, stage 3 unspecified: Secondary | ICD-10-CM | POA: Diagnosis not present

## 2021-01-19 DIAGNOSIS — E1122 Type 2 diabetes mellitus with diabetic chronic kidney disease: Secondary | ICD-10-CM | POA: Diagnosis not present

## 2021-01-19 NOTE — Progress Notes (Signed)
Chronic Care Management Pharmacy Note  02/13/2021 Name:  ANASTASIOS MELANDER MRN:  [id number] DOB:  January 09, 1925  Subjective: Steven Ward is an 87 y.o. year old male who is a primary patient of Burchette, Alinda Sierras, MD.  The CCM team was consulted for assistance with disease management and care coordination needs.    Engaged with patient by telephone for initial visit in response to provider referral for pharmacy case management and/or care coordination services.   Consent to Services:  The patient was given the following information about Chronic Care Management services today, agreed to services, and gave verbal consent: 1. CCM service includes personalized support from designated clinical staff supervised by the primary care provider, including individualized plan of care and coordination with other care providers 2. 24/7 contact phone numbers for assistance for urgent and routine care needs. 3. Service will only be billed when office clinical staff spend 20 minutes or more in a month to coordinate care. 4. Only one practitioner may furnish and bill the service in a calendar month. 5.The patient may stop CCM services at any time (effective at the end of the month) by phone call to the office staff. 6. The patient will be responsible for cost sharing (co-pay) of up to 20% of the service fee (after annual deductible is met). Patient agreed to services and consent obtained.  Patient Care Team: [name], MD as PCP - General Tat, Eustace Quail, DO as Consulting Physician (Neurology) Tat, Eustace Quail, DO as Consulting Physician (Neurology) Viona Gilmore, [facility] as Pharmacist (Pharmacist)  Recent office visits:  01.26. 2022 Telephone encounter: D/c'd Metformin due to kidney function and started back Glyburide 2.5 mg one daily with breakfast. Follow up in 3 months.   01.25.2022 [name], MD: Patient presented for diabetes follow up.   Recent consult visits:  04.05.2022 Gardiner Barefoot, DPM Podiatry: Patient presented for pain due to onychomycosis of toenail.   12.06.2021 Department of [employer]: Patient presented for [employer].   Hospital visits: None in previous 6 months  Objective:  Lab Results  Component Value Date   CREATININE 1.94 (H) 10/12/2020   BUN 45 (H) 10/12/2020   GFR 29.25 (L) 10/12/2020   GFRNONAA 51.52 06/22/2009   GFRAA  04/26/2009    >60        The eGFR has been calculated using the MDRD equation. This calculation has not been validated in all clinical situations. eGFR's persistently <60 mL/min signify possible Chronic Kidney Disease.   NA 137 10/12/2020   K 4.0 10/12/2020   CALCIUM 9.3 10/12/2020   CO2 26 10/12/2020   GLUCOSE 134 (H) 10/12/2020    Lab Results  Component Value Date/Time   HGBA1C 5.7 (A) 02/08/2021 10:35 AM   HGBA1C 6.2 (A) 10/12/2020 11:35 AM   HGBA1C 6.8 (H) 04/19/2016 10:20 AM   HGBA1C 7.3 (H) 12/24/2015 08:57 AM   GFR 29.25 (L) 10/12/2020 12:07 PM   GFR 40.26 (L) 01/02/2020 10:36 AM   MICROALBUR 1.1 10/27/2009 10:36 AM    Last diabetic Eye exam:  Lab Results  Component Value Date/Time   HMDIABEYEEXA No Retinopathy 07/12/2015 12:00 AM    Last diabetic Foot exam:  Lab Results  Component Value Date/Time   HMDIABFOOTEX normal 08/25/2015 12:00 AM     Lab Results  Component Value Date   CHOL 140 04/02/2019   HDL 39.20 04/02/2019   LDLCALC 86 04/02/2019   TRIG 76.0 04/02/2019   CHOLHDL 4 04/02/2019  Hepatic Function Latest Ref Rng & Units 04/02/2019 02/04/2018 04/06/2017  Total Protein 6.0 - 8.3 g/dL 6.6 6.6 6.5  Albumin 3.5 - 5.2 g/dL 3.9 3.7 3.8  AST 0 - 37 U/L $Remo'11 12 14  'WoECA$ ALT 0 - 53 U/L $Remo'4 5 16  'yKIja$ Alk Phosphatase 39 - 117 U/L 73 66 68  Total Bilirubin 0.2 - 1.2 mg/dL 0.6 0.5 0.6  Bilirubin, Direct 0.0 - 0.3 mg/dL 0.1 0.1 0.2    Lab Results  Component Value Date/Time   TSH 4.68 (H) 08/05/2019 11:34 AM   TSH 5.71 (H) 09/24/2017 02:21 PM    CBC Latest Ref Rng & Units  04/19/2016 04/21/2013 04/26/2009  WBC 4.0 - 10.5 K/uL 8.6 10.6(H) 6.3  Hemoglobin 13.0 - 17.0 g/dL 13.6 13.8 11.9(L)  Hematocrit 39.0 - 52.0 % 40.9 41.0 35.9(L)  Platelets 150.0 - 400.0 K/uL 205.0 200.0 190    Lab Results  Component Value Date/Time   VD25OH 32 04/27/2011 02:02 PM    Clinical ASCVD: No  The ASCVD Risk score Mikey Bussing DC Jr., et al., 2013) failed to calculate for the following reasons:   The 2013 ASCVD risk score is only valid for ages 53 to 77    Depression screen PHQ 2/9 07/19/2018 06/10/2018 05/10/2017  Decreased Interest 0 0 0  Down, Depressed, Hopeless 0 0 0  PHQ - 2 Score 0 0 0  Altered sleeping 0 - -  Tired, decreased energy 0 - -  Change in appetite 0 - -  Feeling bad or failure about yourself  0 - -  Trouble concentrating 0 - -  Moving slowly or fidgety/restless 0 - -  Suicidal thoughts 0 - -  PHQ-9 Score 0 - -  Difficult doing work/chores Not difficult at all - -     CHA2DS2/VAS Stroke Risk Points  Current as of 2 days ago     4 >= 2 Points: High Risk  1 - 1.99 Points: Medium Risk  0 Points: Low Risk    Last Change: N/A      Details    This score determines the patient's risk of having a stroke if the  patient has atrial fibrillation.       Points Metrics  0 Has Congestive Heart Failure:  No    Current as of 2 days ago  0 Has Vascular Disease:  No    Current as of 2 days ago  1 Has Hypertension:  Yes    Current as of 2 days ago  2 Age:  58    Current as of 2 days ago  1 Has Diabetes:  Yes    Current as of 2 days ago  0 Had Stroke:  No  Had TIA:  No  Had Thromboembolism:  No    Current as of 2 days ago  0 Male:  No    Current as of 2 days ago      Social History   Tobacco Use  Smoking Status Former Smoker  . Packs/day: 1.00  . Years: 20.00  . Pack years: 20.00  . Types: Cigarettes  . Quit date: 12/28/1968  . Years since quitting: 52.1  Smokeless Tobacco Never Used   BP Readings from Last 3 Encounters:  02/08/21 140/80  10/12/20  118/70  05/05/20 (!) 116/58   Pulse Readings from Last 3 Encounters:  02/08/21 60  10/12/20 85  05/05/20 85   Wt Readings from Last 3 Encounters:  02/08/21 177 lb 6.4 oz (80.5 kg)  10/12/20  175 lb (79.4 kg)  05/05/20 187 lb 1.6 oz (84.9 kg)   BMI Readings from Last 3 Encounters:  02/08/21 26.97 kg/m  10/12/20 26.61 kg/m  05/05/20 28.45 kg/m    Assessment/Interventions: Review of patient past medical history, allergies, medications, health status, including review of consultants reports, laboratory and other test data, was performed as part of comprehensive evaluation and provision of chronic care management services.   SDOH:  (Social Determinants of Health) assessments and interventions performed: Yes SDOH Interventions   Flowsheet Row Most Recent Value  SDOH Interventions   Financial Strain Interventions Intervention Not Indicated  Transportation Interventions Intervention Not Indicated     SDOH Screenings   Alcohol Screen: Not on file  Depression (XHB7-1): Not on file  Financial Resource Strain: Low Risk   . Difficulty of Paying Living Expenses: Not hard at all  Food Insecurity: Not on file  Housing: Not on file  Physical Activity: Not on file  Social Connections: Not on file  Stress: Not on file  Tobacco Use: Medium Risk  . Smoking Tobacco Use: Former Smoker  . Smokeless Tobacco Use: Never Used  Transportation Needs: No Transportation Needs  . Lack of Transportation (Medical): No  . Lack of Transportation (Non-Medical): No   Patient reports he doesn't do much as he doesn't drive and lives with his son. He tries to help out a bit around the house.  Patient gets some takeout and eats at home some. He typically eats chicken, fish, and tuna fish sandwiches. He does not eat many fruits and vegetables and has desserts 2-3 times a week. He drinks mostly water during the day.  Patient does not participate in any structured exercise but he does walk up and down the  stairs 2-3 times a day.  Patient sleeps pretty well and has no trouble falling asleep or staying asleep. He usually just gets up once during the night to go to the bathroom. He reports he does sleep a lot and often naps during the day while watching TV.  Patient reports he doesn't have any side effects from his medications and knows what they are all for.  CCM Care Plan  Allergies  Allergen Reactions  . Amoxicillin     REACTION: rash, dizziness    Medications Reviewed Today    Reviewed by [name], MD (Physician) on 02/08/21 at 1051  Med List Status: <None>  Medication Order Taking? Sig Documenting Provider Last Dose Status Informant  aspirin 81 MG EC tablet [id number] Yes Take 1 tablet by mouth daily. [provider] Taking Active   blood glucose meter kit and supplies KIT [id number] Yes Dispense based on patient and insurance preference. Use up to four times daily as directed. (FOR ICD-9 250.00, 250.01). [name], MD Taking Active   Blood Glucose Monitoring Suppl (ONE TOUCH ULTRA 2) W/DEVICE KIT [id number] Yes Use as directed. [name], MD Taking Active   Carbidopa-Levodopa ER (SINEMET CR) 25-100 MG tablet controlled release [id number] Yes 2 in the AM, 2 in the afternoon, 1 in the evening Burchette, Alinda Sierras, MD Taking Active   glucose blood (ONETOUCH ULTRA) test strip [id number] Yes Test blood sugar 3 times a day [name], MD Taking Active   glyBURIDE (DIABETA) 2.5 MG tablet [id number] Yes Take 1 tablet (2.5 mg total) by mouth daily with breakfast. [name], MD Taking Active   hydrochlorothiazide (MICROZIDE) 12.5 MG capsule [id number] Yes TAKE 1 CAPSULE BY MOUTH EVERY DAY Burchette, Alinda Sierras, MD  Taking Active   Lancet Device MISC [id number] Yes Use 1-4 times daily as needed or directed.  DX E11.9 [name], MD Taking Active   Lancets [facility] ULTRASOFT) lancets [id number] Yes Check 3 times daily. E11.9 [name], MD Taking  Active   lipase/protease/amylase (CREON) 12000-38000 units CPEP capsule [id number] Yes Take by mouth 3 (three) times daily before meals. [provider] Taking Active Self  polyethylene glycol (MIRALAX / GLYCOLAX) packet [id number] Yes Take 17 g by mouth daily as needed for mild constipation. [provider] Taking Active Self  tamsulosin (FLOMAX) 0.4 MG CAPS capsule [id number] Yes TAKE ONE CAPSULE BY MOUTH ONCE DAILY. [name], MD Taking Active   triamcinolone cream (KENALOG) 0.1 % [id number] Yes APPLY  CREAM EXTERNALLY TO AFFECTED AREA TWICE DAILY AS NEEDED Burchette, Alinda Sierras, MD Taking Active           Patient Active Problem List   Diagnosis Date Noted  . At moderate risk for fall 08/05/2019  . PD (Parkinson's disease) ([place]) 01/15/2018  . Dyslipidemia 04/08/2017  . Major depressive episode 04/19/2016  . CKD stage 3 due to type 2 diabetes mellitus ([place]) 08/25/2015  . Spinal stenosis of lumbar region 05/25/2015  . BPH (benign prostatic hyperplasia) 11/10/2013  . Bilateral lumbar radiculopathy 11/10/2013  . Obesity (BMI 30-39.9) 04/21/2013  . Dyspnea 06/23/2011  . PAF (paroxysmal atrial fibrillation) ([place]) 06/23/2011  . SKIN RASH 01/17/2010  . ABNORMAL THYROID FUNCTION TESTS 01/17/2010  . EDEMA LEG 05/04/2009  . GALLSTONES 04/07/2009  . ABDOMINAL PAIN RIGHT UPPER QUADRANT 04/07/2009  . DIARRHEA 02/26/2009  . Type 2 diabetes mellitus, controlled ([place]) 12/01/2008  . Essential hypertension 12/01/2008  . ALLERGIC RHINITIS 12/01/2008  . GERD 12/01/2008  . SPONDYLOSIS, LUMBAR 12/01/2008  . COLONIC POLYPS, HX OF 12/01/2008    Immunization History  Administered Date(s) Administered  . Fluad Quad(high Dose 65+) 08/10/2020  . Influenza Split 08/22/2011  . Influenza Whole 06/18/2008, 07/13/2010  . Influenza, High Dose Seasonal PF 06/11/2017, 07/19/2018  . Influenza, Quadrivalent, Recombinant, Inj, Pf 07/04/2019  . Influenza,inj,Quad PF,6+ Mos 05/29/2013  .  Influenza-Unspecified 06/19/2015  . Moderna Sars-Covid-2 Vaccination 10/10/2019  . PFIZER(Purple Top)SARS-COV-2 Vaccination 10/10/2019, 11/07/2019, 08/23/2020  . Pneumococcal Conjugate-13 07/27/2014  . Td 09/18/2006  . Tdap 10/14/2013  . Zoster, Live 04/10/2012    Conditions to be addressed/monitored:  Hypertension, Diabetes, Atrial Fibrillation, GERD, Chronic Kidney Disease, Depression, BPH, Allergic Rhinitis and Parkinson's disease  Care Plan : CCM Pharmacy Care Plan  Updates made by Viona Gilmore, [place] since 02/13/2021 12:00 AM    Problem: Problem: Hypertension, Diabetes, Atrial Fibrillation, GERD, Chronic Kidney Disease, Depression, BPH, Allergic Rhinitis and Parkinson's disease     Long-Range Goal: Patient-Specific Goal   Start Date: 01/19/2021  Expected End Date: 01/19/2022  This Visit's Progress: On track  Priority: High  Note:   Current Barriers:  . Unable to independently monitor therapeutic efficacy . Unable to self administer medications as prescribed  Pharmacist Clinical Goal(s):  Marland Kitchen Patient will achieve adherence to monitoring guidelines and medication adherence to achieve therapeutic efficacy through collaboration with PharmD and provider.   Interventions: . 1:1 collaboration with [name], MD regarding development and update of comprehensive plan of care as evidenced by provider attestation and co-signature . Inter-disciplinary care team collaboration (see longitudinal plan of care) . Comprehensive medication review performed; medication list updated in electronic medical record  Hypertension (BP goal <140/90) -Controlled -Current treatment: . Hydrochlorothiazide 12.5 mg 1 capsule daily -Medications previously  tried: n/a  -Current home readings: does not monitor at home -Current dietary habits: doesn't eat too much salt; uses very little to season -Current exercise habits: does not exercise -Denies hypotensive/hypertensive symptoms -Educated on Daily  salt intake goal < 2300 mg; Exercise goal of 150 minutes per week; Importance of home blood pressure monitoring; Proper BP monitoring technique; -Counseled to monitor BP at home weekly, document, and provide log at future appointments -Counseled on diet and exercise extensively Recommended to continue current medication  Diabetes (A1c goal <7%) -Controlled -Current medications: . Glyburide 2.5 mg 1 tablet daily  -Medications previously tried: metformin (kidney function), Januvia -Current home glucose readings . fasting glucose:  80, 87, 83, 77, 63 (once every 2 weeks)   . post prandial glucose: 115, 116 -Denies hypoglycemic/hyperglycemic symptoms -Current meal patterns:  . breakfast: did not discuss  . lunch: did not discuss  . dinner: when he skips - not usually hungry . snacks: did not discuss . drinks: did not discuss -Current exercise: does not exercise -Educated on A1c and blood sugar goals; Prevention and management of hypoglycemic episodes; Benefits of routine self-monitoring of blood sugar; -Counseled to check feet daily and get yearly eye exams -Recommended to continue current medication Counseled on discussing with PCP about potentially stopping glyburide.  Atrial Fibrillation (Goal: prevent stroke and major bleeding) -Not ideally controlled -CHADSVASC: 4 -Current treatment: . Rate control: none . Anticoagulation: aspirin 81 mg daily -Medications previously tried: n/a -Home BP and HR readings: does not check  -Counseled on increased risk of stroke due to Afib and benefits of anticoagulation for stroke prevention; -Recommended to take aspirin daily instead of as needed   Parkinson's disease (Goal: minimize symptoms) -Uncontrolled -Current treatment  . Sinemet 25-100 mg 2 tablets in the morning, 2 tabs on afternoon and 1 in evening - only taking 2 in the morning and 2 tabs in afternoon -Medications previously tried: none  - Patient reports this is not helping.  Recommended to discuss with neurology.  BPH (Goal: minimize symptoms) -Controlled -Current treatment  . Tamsulosin 0.4 mg 1 capsule daily -Medications previously tried: none  -Counseled on the importance of taking this after food to prevent hypotension  Health Maintenance -Vaccine gaps: shingrix, 2nd COVID vaccine booster -Current therapy:  Marland Kitchen Miralax packet as needed . Vitamin B12 1000 mcg 1 tablet daily . Vitamin D 5000 units 1 tablet daily  -Educated on Cost vs benefit of each product must be carefully weighed by individual consumer -Patient is satisfied with current therapy and denies issues -Recommended vitamin D level given high dose  Patient Goals/Self-Care Activities . Patient will:  - take medications as prescribed focus on medication adherence by using a pillbox check glucose daily, document, and provide at future appointments check blood pressure weekly, document, and provide at future appointments  Follow Up Plan: Telephone follow up appointment with care management team member scheduled for: 3 months        Medication Assistance: None required.  Patient affirms current coverage meets needs.  Patient's preferred pharmacy is:  [id number] - [place], [place] - [address] [place] [address] Phone: [phone] Fax: [phone]  [id number] - [place], [place] [address] Phone: [phone] Fax: [phone]  [facility]) - [place], [place] [place] [address]  Phone: [phone] Fax: [phone]  Uses pill box? No - uses a drawer Pt endorses 100% compliance Discussed dispense report compared to patient reporting taking daily. Plan to readdress compliance at follow up.   We discussed: Current pharmacy is preferred with insurance plan and patient  is satisfied with pharmacy services Patient decided to: Continue current medication management strategy  Care Plan and Follow Up Patient Decision:  Patient agrees to Care Plan and Follow-up.  Plan: Telephone follow up appointment with care management team member scheduled for:  3 months  Jeni Salles, PharmD [place] Pharmacist [facility] [place] [phone]

## 2021-02-08 ENCOUNTER — Ambulatory Visit (INDEPENDENT_AMBULATORY_CARE_PROVIDER_SITE_OTHER): Payer: Medicare PPO | Admitting: Family Medicine

## 2021-02-08 ENCOUNTER — Other Ambulatory Visit: Payer: Self-pay

## 2021-02-08 ENCOUNTER — Telehealth: Payer: Medicare PPO

## 2021-02-08 ENCOUNTER — Encounter: Payer: Self-pay | Admitting: Family Medicine

## 2021-02-08 VITALS — BP 140/80 | HR 60 | Temp 97.6°F | Wt 177.4 lb

## 2021-02-08 DIAGNOSIS — E1122 Type 2 diabetes mellitus with diabetic chronic kidney disease: Secondary | ICD-10-CM | POA: Diagnosis not present

## 2021-02-08 DIAGNOSIS — N183 Chronic kidney disease, stage 3 unspecified: Secondary | ICD-10-CM | POA: Diagnosis not present

## 2021-02-08 DIAGNOSIS — M48061 Spinal stenosis, lumbar region without neurogenic claudication: Secondary | ICD-10-CM

## 2021-02-08 DIAGNOSIS — E1121 Type 2 diabetes mellitus with diabetic nephropathy: Secondary | ICD-10-CM

## 2021-02-08 DIAGNOSIS — I10 Essential (primary) hypertension: Secondary | ICD-10-CM | POA: Diagnosis not present

## 2021-02-08 LAB — POCT GLYCOSYLATED HEMOGLOBIN (HGB A1C): Hemoglobin A1C: 5.7 % — AB (ref 4.0–5.6)

## 2021-02-08 LAB — GLUCOSE, POCT (MANUAL RESULT ENTRY): POC Glucose: 74 mg/dl (ref 70–99)

## 2021-02-08 MED ORDER — ONETOUCH ULTRA 2 W/DEVICE KIT: PACK | 0 refills | Status: DC

## 2021-02-08 NOTE — Patient Instructions (Signed)
Preventing Hypoglycemia Hypoglycemia occurs when the level of sugar (glucose) in the blood is too low. Hypoglycemia can happen in people who do or do not have diabetes (diabetes mellitus). It can develop quickly, and it can be a medical emergency. For most people with diabetes, a blood glucose level below 70 mg/dL (3.9 mmol/L) is considered hypoglycemia. Glucose is a type of sugar that provides the body's main source of energy. Certain hormones (insulin and glucagon) control the level of glucose in the blood. Insulin lowers blood glucose, and glucagon increases blood glucose. Hypoglycemia can result from having too much insulin in the bloodstream, or from not eating enough food that contains glucose. Your risk for hypoglycemia is higher:  If you take insulin or diabetes medicines to help lower your blood glucose or help your body make more insulin.  If you skip or delay a meal or snack.  If you are ill.  During and after exercise. You can prevent hypoglycemia by working with your health care provider to adjust your meal plan as needed and by taking other precautions. How can hypoglycemia affect me? Mild symptoms Mild hypoglycemia may not cause any symptoms. If you do have symptoms, they may include:  Hunger.  Anxiety.  Sweating and feeling clammy.  Dizziness or feeling light-headed.  Sleepiness.  Nausea.  Increased heart rate.  Headache.  Blurry vision.  Irritability.  Tingling or numbness around the mouth, lips, or tongue.  A change in coordination.  Restless sleep. If mild hypoglycemia is not recognized and treated, it can quickly become moderate or severe hypoglycemia. Moderate symptoms Moderate hypoglycemia can cause:  Mental confusion and poor judgment.  Behavior changes.  Weakness.  Irregular heartbeat. Severe symptoms Severe hypoglycemia is a medical emergency. It can cause:  Fainting.  Seizures.  Loss of consciousness (coma).  Death. What  nutrition changes can be made?  Work with your health care provider or diet and nutrition specialist (dietitian) to make a healthy meal plan that is right for you. Follow your meal plan carefully.  Eat meals at regular times.  If recommended by your health care provider, have snacks between meals.  Donot skip or delay meals or snacks. You can be at risk for hypoglycemia if you are not getting enough carbohydrates. What lifestyle changes can be made?  Work closely with your health care provider to manage your blood glucose. Make sure you know: ? Your goal blood glucose levels. ? How and when to check your blood glucose. ? The symptoms of hypoglycemia. It is important to treat it right away to keep it from becoming severe.  Do not drink alcohol on an empty stomach.  When you are ill, check your blood glucose more often than usual. Follow your sick day plan whenever you cannot eat or drink normally. Make this plan in advance with your health care provider.  Always check your blood glucose before, during, and after exercise.   How is this treated? This condition can often be treated by immediately eating or drinking something that contains sugar, such as:  Fruit juice, 4-6 oz (120-150 mL).  Regular (not diet) soda, 4-6 oz (120-150 mL).  Low-fat milk, 4 oz (120 mL).  Several pieces of hard candy.  Sugar or honey, 1 Tbsp (15 mL). Treating hypoglycemia if you have diabetes If you are alert and able to swallow safely, follow the 15:15 rule:  Take 15 grams of a rapid-acting carbohydrate. Talk with your health care provider about how much you should take.  Rapid-acting  options include: ? Glucose pills (take 15 grams). ? 6-8 pieces of hard candy. ? 4-6 oz (120-150 mL) of fruit juice. ? 4-6 oz (120-150 mL) of regular (not diet) soda.  Check your blood glucose 15 minutes after you take the carbohydrate.  If the repeat blood glucose level is still at or below 70 mg/dL (3.9 mmol/L),  take 15 grams of a carbohydrate again.  If your blood glucose level does not increase above 70 mg/dL (3.9 mmol/L) after 3 tries, seek emergency medical care.  After your blood glucose level returns to normal, eat a meal or a snack within 1 hour. Treating severe hypoglycemia Severe hypoglycemia is when your blood glucose level is at or below 54 mg/dL (3 mmol/L). Severe hypoglycemia is a medical emergency. Get medical help right away. If you have severe hypoglycemia and you cannot eat or drink, you may need an injection of glucagon. A family member or close friend should learn how to check your blood glucose and how to give you a glucagon injection. Ask your health care provider if you need to have an emergency glucagon injection kit available. Severe hypoglycemia may need to be treated in a hospital. The treatment may include getting glucose through an IV. You may also need treatment for the cause of your hypoglycemia. Where to find more information  American Diabetes Association: www.diabetes.[employer] of Diabetes and Digestive and Kidney Diseases: [employer] Contact a health care provider if:  You have problems keeping your blood glucose in your target range.  You have frequent episodes of hypoglycemia. Get help right away if:  You continue to have hypoglycemia symptoms after eating or drinking something containing glucose.  Your blood glucose level is at or below 54 mg/dL (3 mmol/L).  You faint.  You have a seizure. These symptoms may represent a serious problem that is an emergency. Do not wait to see if the symptoms will go away. Get medical help right away. Call your local emergency services (911 in the [place] Summary  Know the symptoms of hypoglycemia, and when you are at risk for it (such as during exercise or when you are sick). Check your blood glucose often when you are at risk for hypoglycemia.  Hypoglycemia can develop quickly, and it can be dangerous  if it is not treated right away. If you have a history of severe hypoglycemia, make sure you know how to use your glucagon injection kit.  Make sure you know how to treat hypoglycemia. Keep a carbohydrate snack available when you may be at risk for hypoglycemia. This information is not intended to replace advice given to you by your health care provider. Make sure you discuss any questions you have with your health care provider. Document Revised: 12/27/2018 Document Reviewed: 05/02/2017 Elsevier Patient Education  [place].  Monitor blood sugars and if morning glucose < 70 would hold the Glyburide.

## 2021-02-08 NOTE — Progress Notes (Signed)
Established Patient Office Visit  Subjective:  Patient ID: Steven Ward, male    DOB: 1924/10/11  Age: 87 y.o. MRN: [id number]  CC:  Chief Complaint  Patient presents with  . Follow-up    diabetes    HPI [name] presents for medical follow-up  He has multiple chronic problems including hypertension, past history of A. fib, chronic kidney disease, type 2 diabetes, lumbar stenosis, Parkinson's disease.  He has been very limited in activity for several years now because of his severe lumbar stenosis.  He has type 2 diabetes which has been very well controlled.  We discontinued his low-dose metformin after labs last visit revealed GFR of 29.  He is taking very low-dose glyburide 2.5 mg daily.  He has had a couple of blood sugars high 60s but no clear hypoglycemic symptoms.  A1c today 5.7%.  He brings in his home meter to compare with ours.  He is followed by podiatry regularly.  He has had some chronic bilateral leg edema which gets better consistently with elevation.  No increased dyspnea.  No orthopnea.  Weight is 177 compared to 175 last visit.  Past Medical History:  Diagnosis Date  . ABNORMAL THYROID FUNCTION TESTS 01/17/2010  . ALLERGIC RHINITIS 12/01/2008  . Colon cancer ([place])   . COLONIC POLYPS, HX OF 12/01/2008  . DIABETES MELLITUS, TYPE II 12/01/2008  . EDEMA LEG 05/04/2009  . ESSENTIAL HYPERTENSION 12/01/2008  . Exocrine pancreatic insufficiency   . GALLSTONES 04/07/2009  . GERD 12/01/2008  . Hay fever   . SPONDYLOSIS, LUMBAR 12/01/2008    Past Surgical History:  Procedure Laterality Date  . ANKLE SURGERY Right   . CATARACT EXTRACTION    . CHOLECYSTECTOMY    . COLON SURGERY     resection 1990 for cancer  . LEG SURGERY Left     Family History  Problem Relation Age of Onset  . Cancer Other        colon  . Diabetes Other   . Stroke Other   . Cancer Mother     Social History   Socioeconomic History  . Marital status: Widowed    Spouse name: Not on  file  . Number of children: 2  . Years of education: Not on file  . Highest education level: Not on file  Occupational History  . Occupation: retired    Fish farm manager: [employer]    Comment: bus driver  Tobacco Use  . Smoking status: Former Smoker    Packs/day: 1.00    Years: 20.00    Pack years: 20.00    Types: Cigarettes    Quit date: 12/28/1968    Years since quitting: 52.1  . Smokeless tobacco: Never Used  Vaping Use  . Vaping Use: Never used  Substance and Sexual Activity  . Alcohol use: No  . Drug use: No  . Sexual activity: Not on file  Other Topics Concern  . Not on file  Social History Narrative   R handed   Lives with son    Social Determinants of Health   Financial Resource Strain: Not on file  Food Insecurity: Not on file  Transportation Needs: Not on file  Physical Activity: Not on file  Stress: Not on file  Social Connections: Not on file  Intimate Partner Violence: Not on file    Outpatient Medications Prior to Visit  Medication Sig Dispense Refill  . aspirin 81 MG EC tablet Take 1 tablet by mouth daily.    Marland Kitchen  blood glucose meter kit and supplies KIT Dispense based on patient and insurance preference. Use up to four times daily as directed. (FOR ICD-9 250.00, 250.01). 1 each 0  . Carbidopa-Levodopa ER (SINEMET CR) 25-100 MG tablet controlled release 2 in the AM, 2 in the afternoon, 1 in the evening 450 tablet 1  . glucose blood (ONETOUCH ULTRA) test strip Test blood sugar 3 times a day 300 each 1  . glyBURIDE (DIABETA) 2.5 MG tablet Take 1 tablet (2.5 mg total) by mouth daily with breakfast. 90 tablet 3  . hydrochlorothiazide (MICROZIDE) 12.5 MG capsule TAKE 1 CAPSULE BY MOUTH EVERY DAY 90 capsule 1  . Lancet Device MISC Use 1-4 times daily as needed or directed.  DX E11.9 1 each 3  . Lancets (ONETOUCH ULTRASOFT) lancets Check 3 times daily. E11.9 300 each 3  . lipase/protease/amylase (CREON) 12000-38000 units CPEP capsule Take by mouth 3 (three)  times daily before meals.    . polyethylene glycol (MIRALAX / GLYCOLAX) packet Take 17 g by mouth daily as needed for mild constipation.    . tamsulosin (FLOMAX) 0.4 MG CAPS capsule TAKE ONE CAPSULE BY MOUTH ONCE DAILY. 90 capsule 1  . triamcinolone cream (KENALOG) 0.1 % APPLY  CREAM EXTERNALLY TO AFFECTED AREA TWICE DAILY AS NEEDED 80 g 2  . Blood Glucose Monitoring Suppl (ONE TOUCH ULTRA 2) W/DEVICE KIT Use as directed. 1 each 0   No facility-administered medications prior to visit.    Allergies  Allergen Reactions  . Amoxicillin     REACTION: rash, dizziness    ROS Review of Systems  Constitutional: Positive for fatigue. Negative for chills and fever.  Eyes: Negative for visual disturbance.  Respiratory: Negative for cough, chest tightness and shortness of breath.   Cardiovascular: Positive for leg swelling. Negative for chest pain and palpitations.  Neurological: Negative for dizziness, syncope, weakness, light-headedness and headaches.      Objective:    Physical Exam Vitals reviewed.  Constitutional:      Appearance: Normal appearance.  Cardiovascular:     Rate and Rhythm: Normal rate.  Pulmonary:     Effort: Pulmonary effort is normal.     Breath sounds: Normal breath sounds.  Musculoskeletal:     Right lower leg: Edema present.     Left lower leg: Edema present.     Comments: Does have some pitting edema lower legs bilaterally.  No lesions.  Neurological:     Mental Status: He is alert.     BP 140/80 (BP Location: Left Arm, Patient Position: Sitting, Cuff Size: Normal)   Pulse 60   Temp 97.6 F (36.4 C) (Oral)   Wt 177 lb 6.4 oz (80.5 kg)   SpO2 96%   BMI 26.97 kg/m  Wt Readings from Last 3 Encounters:  02/08/21 177 lb 6.4 oz (80.5 kg)  10/12/20 175 lb (79.4 kg)  05/05/20 187 lb 1.6 oz (84.9 kg)     Health Maintenance Due  Topic Date Due  . OPHTHALMOLOGY EXAM  04/24/2018  . COVID-19 Vaccine (4 - Booster) 11/21/2020    There are no preventive  care reminders to display for this patient.  Lab Results  Component Value Date   TSH 4.68 (H) 08/05/2019   Lab Results  Component Value Date   WBC 8.6 04/19/2016   HGB 13.6 04/19/2016   HCT 40.9 04/19/2016   MCV 94.2 04/19/2016   PLT 205.0 04/19/2016   Lab Results  Component Value Date   NA 137 10/12/2020  K 4.0 10/12/2020   CO2 26 10/12/2020   GLUCOSE 134 (H) 10/12/2020   BUN 45 (H) 10/12/2020   CREATININE 1.94 (H) 10/12/2020   BILITOT 0.6 04/02/2019   ALKPHOS 73 04/02/2019   AST 11 04/02/2019   ALT 4 04/02/2019   PROT 6.6 04/02/2019   ALBUMIN 3.9 04/02/2019   CALCIUM 9.3 10/12/2020   GFR 29.25 (L) 10/12/2020   Lab Results  Component Value Date   CHOL 140 04/02/2019   Lab Results  Component Value Date   HDL 39.20 04/02/2019   Lab Results  Component Value Date   LDLCALC 86 04/02/2019   Lab Results  Component Value Date   TRIG 76.0 04/02/2019   Lab Results  Component Value Date   CHOLHDL 4 04/02/2019   Lab Results  Component Value Date   HGBA1C 5.7 (A) 02/08/2021      Assessment & Plan:   #1 type 2 diabetes very well controlled with A1c 5.7%. -We expressed our concerns regarding his risk for hypoglycemia.  Even though he is on very low-dose glyburide this does pose some risk of hypoglycemia.  We recommend if his fasting blood sugars are less than 70-80 that he not take the glyburide and we may end up discontinuing this altogether though he is not had any worrisome hypoglycemic symptoms recently.  Gave him a handout of things to look for for hypoglycemia  #2 chronic bilateral leg edema.  Suspect mostly venous stasis.  We did discuss possible use of loop diuretic but at this point his symptoms seem to be fairly well controlled and improve with elevation.  We did discuss compression but he feels that these will be difficult to get on  #3 chronic kidney disease -Avoid nonsteroidals. -Recheck basic metabolic panel at follow-up visit in 3 months  #4  hypertension.  Slightly up today with reading 140/80.  We recommend a monitor closely at home and be in touch if consistently over 140/90  Meds ordered this encounter  Medications  . Blood Glucose Monitoring Suppl (ONE TOUCH ULTRA 2) w/Device KIT    Sig: Use as directed.    Dispense:  1 kit    Refill:  0    Follow-up: Return in about 3 months (around 05/11/2021).    Carolann Littler, MD

## 2021-02-13 NOTE — Patient Instructions (Addendum)
Hi Steven Ward,   It was great to get to meet you over the telephone! Below is a summary of some of the topics we discussed.   Please reach out to me if you have any questions or need anything before our follow up!  Best, Maddie  Jeni Salles, PharmD, [facility] [phone]  Visit Information  Goals Addressed            This Visit's Progress   . Manage My Medicine       Timeframe:  Short-Term Goal Priority:  High Start Date:                             Expected End Date:                       Follow Up Date 04/22/2021    - call for medicine refill 2 or 3 days before it runs out - use a pillbox to sort medicine - use an alarm clock or phone to remind me to take my medicine    Why is this important?   . These steps will help you keep on track with your medicines.   Notes:       Patient Care Plan: CCM Pharmacy Care Plan    Problem Identified: Problem: Hypertension, Diabetes, Atrial Fibrillation, GERD, Chronic Kidney Disease, Depression, BPH, Allergic Rhinitis and Parkinson's disease     Long-Range Goal: Patient-Specific Goal   Start Date: 01/19/2021  Expected End Date: 01/19/2022  This Visit's Progress: On track  Priority: High  Note:   Current Barriers:  . Unable to independently monitor therapeutic efficacy . Unable to self administer medications as prescribed  Pharmacist Clinical Goal(s):  Marland Kitchen Patient will achieve adherence to monitoring guidelines and medication adherence to achieve therapeutic efficacy through collaboration with PharmD and provider.   Interventions: . 1:1 collaboration with [name], MD regarding development and update of comprehensive plan of care as evidenced by provider attestation and co-signature . Inter-disciplinary care team collaboration (see longitudinal plan of care) . Comprehensive medication review performed; medication list updated in electronic medical record  Hypertension (BP  goal <140/90) -Controlled -Current treatment: . Hydrochlorothiazide 12.5 mg 1 capsule daily -Medications previously tried: n/a  -Current home readings: does not monitor at home -Current dietary habits: doesn't eat too much salt; uses very little to season -Current exercise habits: does not exercise -Denies hypotensive/hypertensive symptoms -Educated on Daily salt intake goal < 2300 mg; Exercise goal of 150 minutes per week; Importance of home blood pressure monitoring; Proper BP monitoring technique; -Counseled to monitor BP at home weekly, document, and provide log at future appointments -Counseled on diet and exercise extensively Recommended to continue current medication  Diabetes (A1c goal <7%) -Controlled -Current medications: . Glyburide 2.5 mg 1 tablet daily  -Medications previously tried: metformin (kidney function), Januvia -Current home glucose readings . fasting glucose:  80, 87, 83, 77, 63 (once every 2 weeks)   . post prandial glucose: 115, 116 -Denies hypoglycemic/hyperglycemic symptoms -Current meal patterns:  . breakfast: did not discuss  . lunch: did not discuss  . dinner: when he skips - not usually hungry . snacks: did not discuss . drinks: did not discuss -Current exercise: does not exercise -Educated on A1c and blood sugar goals; Prevention and management of hypoglycemic episodes; Benefits of routine self-monitoring of blood sugar; -Counseled to check feet daily and get yearly eye exams -  Recommended to continue current medication Counseled on discussing with PCP about potentially stopping glyburide.  Atrial Fibrillation (Goal: prevent stroke and major bleeding) -Not ideally controlled -CHADSVASC: 4 -Current treatment: . Rate control: none . Anticoagulation: aspirin 81 mg daily -Medications previously tried: n/a -Home BP and HR readings: does not check  -Counseled on increased risk of stroke due to Afib and benefits of anticoagulation for stroke  prevention; -Recommended to take aspirin daily instead of as needed   Parkinson's disease (Goal: minimize symptoms) -Uncontrolled -Current treatment  . Sinemet 25-100 mg 2 tablets in the morning, 2 tabs on afternoon and 1 in evening - only taking 2 in the morning and 2 tabs in afternoon -Medications previously tried: none  - Patient reports this is not helping. Recommended to discuss with neurology.  BPH (Goal: minimize symptoms) -Controlled -Current treatment  . Tamsulosin 0.4 mg 1 capsule daily -Medications previously tried: none  -Counseled on the importance of taking this after food to prevent hypotension  Health Maintenance -Vaccine gaps: shingrix, 2nd COVID vaccine booster -Current therapy:  Marland Kitchen Miralax packet as needed . Vitamin B12 1000 mcg 1 tablet daily . Vitamin D 5000 units 1 tablet daily  -Educated on Cost vs benefit of each product must be carefully weighed by individual consumer -Patient is satisfied with current therapy and denies issues -Recommended vitamin D level given high dose  Patient Goals/Self-Care Activities . Patient will:  - take medications as prescribed focus on medication adherence by using a pillbox check glucose daily, document, and provide at future appointments check blood pressure weekly, document, and provide at future appointments  Follow Up Plan: Telephone follow up appointment with care management team member scheduled for: 3 months      [name] was given information about Chronic Care Management services today including:  1. CCM service includes personalized support from designated clinical staff supervised by his physician, including individualized plan of care and coordination with other care providers 2. 24/7 contact phone numbers for assistance for urgent and routine care needs. 3. Standard insurance, coinsurance, copays and deductibles apply for chronic care management only during months in which we provide at least 20  minutes of these services. Most insurances cover these services at 100%, however patients may be responsible for any copay, coinsurance and/or deductible if applicable. This service may help you avoid the need for more expensive face-to-face services. 4. Only one practitioner may furnish and bill the service in a calendar month. 5. The patient may stop CCM services at any time (effective at the end of the month) by phone call to the office staff.  Patient agreed to services and verbal consent obtained.   The patient verbalized understanding of instructions, educational materials, and care plan provided today and agreed to receive a mailed copy of patient instructions, educational materials, and care plan.  Telephone follow up appointment with pharmacy team member scheduled for: 3 months  Viona Gilmore, [facility]

## 2021-03-08 ENCOUNTER — Telehealth: Payer: Self-pay | Admitting: Family Medicine

## 2021-03-08 NOTE — Telephone Encounter (Signed)
Left message for patient to call back and schedule Medicare Annual Wellness Visit (AWV) either virtually or in office.   Last AWV 05/10/17  please schedule at anytime with [facility] Nurse Health Advisor 1 or 2   This should be a 45 minute visit.

## 2021-03-15 ENCOUNTER — Telehealth: Payer: Self-pay | Admitting: Pharmacist

## 2021-03-15 NOTE — Chronic Care Management (AMB) (Signed)
Chronic Care Management Pharmacy Assistant   Name: Steven Ward  MRN: [id number] DOB: [birth year]  Reason for Encounter: Disease State/ Diabetes Assessment Call.    Conditions to be addressed/monitored: DMII  Recent office visits:  02/08/21 Carolann Littler MD (PCP) - seen for type 2 diabetes and other chronic conditions. No medication changes. Follow up in 3 months.   Recent consult visits:  None.  Hospital visits:  None in previous 6 months  Medications: Outpatient Encounter Medications as of 03/15/2021  Medication Sig   aspirin 81 MG EC tablet Take 1 tablet by mouth daily.   blood glucose meter kit and supplies KIT Dispense based on patient and insurance preference. Use up to four times daily as directed. (FOR ICD-9 250.00, 250.01).   Blood Glucose Monitoring Suppl (ONE TOUCH ULTRA 2) w/Device KIT Use as directed.   Carbidopa-Levodopa ER (SINEMET CR) 25-100 MG tablet controlled release 2 in the AM, 2 in the afternoon, 1 in the evening   glucose blood (ONETOUCH ULTRA) test strip Test blood sugar 3 times a day   glyBURIDE (DIABETA) 2.5 MG tablet Take 1 tablet (2.5 mg total) by mouth daily with breakfast.   hydrochlorothiazide (MICROZIDE) 12.5 MG capsule TAKE 1 CAPSULE BY MOUTH EVERY DAY   Lancet Device MISC Use 1-4 times daily as needed or directed.  DX E11.9   Lancets (ONETOUCH ULTRASOFT) lancets Check 3 times daily. E11.9   lipase/protease/amylase (CREON) 12000-38000 units CPEP capsule Take by mouth 3 (three) times daily before meals.   polyethylene glycol (MIRALAX / GLYCOLAX) packet Take 17 g by mouth daily as needed for mild constipation.   tamsulosin (FLOMAX) 0.4 MG CAPS capsule TAKE ONE CAPSULE BY MOUTH ONCE DAILY.   triamcinolone cream (KENALOG) 0.1 % APPLY  CREAM EXTERNALLY TO AFFECTED AREA TWICE DAILY AS NEEDED   No facility-administered encounter medications on file as of 03/15/2021.   Recent Relevant Labs: Lab Results  Component Value Date/Time   HGBA1C 5.7 (A)  02/08/2021 10:35 AM   HGBA1C 6.2 (A) 10/12/2020 11:35 AM   HGBA1C 6.8 (H) 04/19/2016 10:20 AM   HGBA1C 7.3 (H) 12/24/2015 08:57 AM   MICROALBUR 1.1 10/27/2009 10:36 AM    Kidney Function Lab Results  Component Value Date/Time   CREATININE 1.94 (H) 10/12/2020 12:07 PM   CREATININE 1.61 (H) 01/02/2020 10:36 AM   GFR 29.25 (L) 10/12/2020 12:07 PM   GFRNONAA 51.52 06/22/2009 11:16 AM   GFRAA  04/26/2009 09:20 AM    >60        The eGFR has been calculated using the MDRD equation. This calculation has not been validated in all clinical situations. eGFR's persistently <60 mL/min signify possible Chronic Kidney Disease.    Current antihyperglycemic regimen:  Glyburide 2.5 mg 1 tablet daily What recent interventions/DTPs have been made to improve glycemic control:  None.  Have there been any recent hospitalizations or ED visits since last visit with CPP? No Patient denies hypoglycemic symptoms, including None Patient denies hyperglycemic symptoms, including none How often are you checking your blood sugar? once daily What are your blood sugars ranging?  Fasting: 87 this am 6/28. 80s- 90s range. Before meals: None  After meals: None.  Bedtime: None.  During the week, how often does your blood glucose drop below 70? Never Are you checking your feet daily/regularly? Yes. Checks his feet everyday.   Adherence Review: Is the patient currently on a STATIN medication? No Is the patient currently on ACE/ARB medication? No Does the patient have >  5 day gap between last estimated fill dates? No  Notes: Spoke with patient and reviewed all medications as listed. Patient reports taking all medications as prescribed and no issues at this time. Patient stated his A1C is normal and that his PCP has been contemplating taking him off of glyburide. Patient stated for breakfast in the mornings he tends ti have cheerios or oatmeal with hot tea. For lunch he has a sandwich ir whatever is available and  for dinner he eats whatever [place] cooked and its usually a meat of some sort and a vegetable. Patient drinks plenty of water. Patient states he checks his blood sugar every morning and the readings are in his glucometer. Patient stated he is able to get around his house but that is it and he's not able to exercise or walk for a continuous amount of time and he uses a cane. Patient stated he cant complain at his age. Patient thanked me for my call.   Star Rating Drugs:  Glyburide 2.$RemoveBefo'[id number]$  - last filled on 01/05/21 90[facility] at [facility] [phone]

## 2021-03-23 ENCOUNTER — Telehealth (INDEPENDENT_AMBULATORY_CARE_PROVIDER_SITE_OTHER): Payer: Medicare Other | Admitting: Family Medicine

## 2021-03-23 ENCOUNTER — Other Ambulatory Visit: Payer: Self-pay

## 2021-03-23 DIAGNOSIS — J069 Acute upper respiratory infection, unspecified: Secondary | ICD-10-CM | POA: Diagnosis not present

## 2021-03-23 MED ORDER — AZITHROMYCIN 250 MG PO TABS: ORAL_TABLET | ORAL | 0 refills | Status: AC

## 2021-03-23 NOTE — Progress Notes (Signed)
Patient ID: Steven Ward, male   DOB: 07-19-1925, 87 y.o.   MRN: [id number]   This visit type was conducted due to national recommendations for restrictions regarding the COVID-19 pandemic in an effort to limit this patient's exposure and mitigate transmission in our community.   Virtual Visit via Video Note  I connected with Steven Ward on 03/23/21 at  4:15 PM EDT by a video enabled telemedicine application and verified that I am speaking with the correct person using two identifiers.  Location patient: home Location provider:work or home office Persons participating in the virtual visit: patient, provider  I discussed the limitations of evaluation and management by telemedicine and the availability of in person appointments. The patient expressed understanding and agreed to proceed.   HPI:  Steven Ward had onset 3 days ago some nasal congestion, sore throat, cough.  No documented fevers.  He was down in [place] over a week ago in [place].  He had a grandson that reportedly had COVID but this was over a week ago that he was exposed.  Steven Ward has not done COVID testing yet.  No dyspnea.  No nausea or vomiting.  Eating and drinking well.    He has had Pfizer vaccines with 1 booster   ROS: See pertinent positives and negatives per HPI.  Past Medical History:  Diagnosis Date   ABNORMAL THYROID FUNCTION TESTS 01/17/2010   ALLERGIC RHINITIS 12/01/2008   Colon cancer ([place])    COLONIC POLYPS, HX OF 12/01/2008   DIABETES MELLITUS, TYPE II 12/01/2008   EDEMA LEG 05/04/2009   ESSENTIAL HYPERTENSION 12/01/2008   Exocrine pancreatic insufficiency    GALLSTONES 04/07/2009   GERD 12/01/2008   Hay fever    SPONDYLOSIS, LUMBAR 12/01/2008    Past Surgical History:  Procedure Laterality Date   ANKLE SURGERY Right    CATARACT EXTRACTION     CHOLECYSTECTOMY     COLON SURGERY     resection 1990 for cancer   LEG SURGERY Left     Family History  Problem Relation Age of Onset   Cancer  Other        colon   Diabetes Other    Stroke Other    Cancer Mother     SOCIAL HX: Non-smoker   Current Outpatient Medications:    aspirin 81 MG EC tablet, Take 1 tablet by mouth daily., Disp: , Rfl:    azithromycin (ZITHROMAX Z-PAK) 250 MG tablet, Take 2 tablets (500 mg) on  Day 1,  followed by 1 tablet (250 mg) once daily on Days 2 through 5., Disp: 6 each, Rfl: 0   blood glucose meter kit and supplies KIT, Dispense based on patient and insurance preference. Use up to four times daily as directed. (FOR ICD-9 250.00, 250.01)., Disp: 1 each, Rfl: 0   Blood Glucose Monitoring Suppl (ONE TOUCH ULTRA 2) w/Device KIT, Use as directed., Disp: 1 kit, Rfl: 0   Carbidopa-Levodopa ER (SINEMET CR) 25-100 MG tablet controlled release, 2 in the AM, 2 in the afternoon, 1 in the evening, Disp: 450 tablet, Rfl: 1   glucose blood (ONETOUCH ULTRA) test strip, Test blood sugar 3 times a day, Disp: 300 each, Rfl: 1   glyBURIDE (DIABETA) 2.5 MG tablet, Take 1 tablet (2.5 mg total) by mouth daily with breakfast., Disp: 90 tablet, Rfl: 3   hydrochlorothiazide (MICROZIDE) 12.5 MG capsule, TAKE 1 CAPSULE BY MOUTH EVERY DAY, Disp: 90 capsule, Rfl: 1   Lancet Device MISC, Use 1-4 times daily as  needed or directed.  DX E11.9, Disp: 1 each, Rfl: 3   Lancets (ONETOUCH ULTRASOFT) lancets, Check 3 times daily. E11.9, Disp: 300 each, Rfl: 3   lipase/protease/amylase (CREON) 12000-38000 units CPEP capsule, Take by mouth 3 (three) times daily before meals., Disp: , Rfl:    polyethylene glycol (MIRALAX / GLYCOLAX) packet, Take 17 g by mouth daily as needed for mild constipation., Disp: , Rfl:    tamsulosin (FLOMAX) 0.4 MG CAPS capsule, TAKE ONE CAPSULE BY MOUTH ONCE DAILY., Disp: 90 capsule, Rfl: 1   triamcinolone cream (KENALOG) 0.1 %, APPLY  CREAM EXTERNALLY TO AFFECTED AREA TWICE DAILY AS NEEDED, Disp: 80 g, Rfl: 2  EXAM:  VITALS per patient if applicable:  GENERAL: alert, oriented, appears well and in no acute  distress  HEENT: atraumatic, conjunttiva clear, no obvious abnormalities on inspection of external nose and ears  NECK: normal movements of the head and neck  LUNGS: on inspection no signs of respiratory distress, breathing rate appears normal, no obvious gross SOB, gasping or wheezing  CV: no obvious cyanosis  MS: moves all visible extremities without noticeable abnormality  PSYCH/NEURO: pleasant and cooperative, no obvious depression or anxiety, speech and thought processing grossly intact  ASSESSMENT AND PLAN:  Discussed the following assessment and plan:  Upper respiratory symptoms with cough.  Patient is nontoxic in appearance and in no respiratory distress.  -Spoke to patient and his son.  We recommended plenty fluids and rest.  If possible, pulse oximeter to monitor oxygen levels -We did suggest they consider COVID testing to further clarify -We agreed to cover with Zithromax for 5 days.  He has tolerated this well in the past.  Follow-up immediately for any fever or worsening symptoms     I discussed the assessment and treatment plan with the patient. The patient was provided an opportunity to ask questions and all were answered. The patient agreed with the plan and demonstrated an understanding of the instructions.   The patient was advised to call back or seek an in-person evaluation if the symptoms worsen or if the condition fails to improve as anticipated.     Carolann Littler, MD

## 2021-03-30 ENCOUNTER — Other Ambulatory Visit: Payer: Self-pay | Admitting: Family Medicine

## 2021-03-30 ENCOUNTER — Other Ambulatory Visit: Payer: Self-pay

## 2021-03-30 ENCOUNTER — Ambulatory Visit (INDEPENDENT_AMBULATORY_CARE_PROVIDER_SITE_OTHER): Payer: Medicare Other | Admitting: Podiatry

## 2021-03-30 ENCOUNTER — Encounter: Payer: Self-pay | Admitting: Podiatry

## 2021-03-30 DIAGNOSIS — B351 Tinea unguium: Secondary | ICD-10-CM

## 2021-03-30 DIAGNOSIS — M79676 Pain in unspecified toe(s): Secondary | ICD-10-CM

## 2021-03-30 DIAGNOSIS — E119 Type 2 diabetes mellitus without complications: Secondary | ICD-10-CM

## 2021-03-30 NOTE — Progress Notes (Signed)
This patient returns to my office for at risk foot care.  This patient requires this care by a professional since this patient will be at risk due to having  Diabetes and chronic kidney disease.  This patient is unable to cut nails himself since the patient cannot reach his nails.These nails are painful walking and wearing shoes.  This patient presents for at risk foot care today.  General Appearance  Alert, conversant and in no acute stress.  Vascular  Dorsalis pedis and posterior tibial  pulses are weakly palpable  bilaterally.  Capillary return is within normal limits  bilaterally. Cold feet   Bilaterally.  Absent digital hair  B/L.  Neurologic  Senn-Weinstein monofilament wire test within normal limits  bilaterally. Muscle power within normal limits bilaterally.  Nails Thick disfigured discolored nails with subungual debris  from hallux to fifth toes bilaterally. No evidence of bacterial infection or drainage bilaterally.  Orthopedic  No limitations of motion  feet .  No crepitus or effusions noted.  No bony pathology or digital deformities noted.  Skin  normotropic skin with no porokeratosis noted bilaterally.  No signs of infections or ulcers noted.     Onychomycosis  Pain in right toes  Pain in left toes  Consent was obtained for treatment procedures.   Mechanical debridement of nails 1-5  bilaterally performed with a nail nipper.  Filed with dremel without incident.  Iatrogenic lesion bandaged with  DSD.   Return office visit     3 months                  Told patient to return for periodic foot care and evaluation due to potential at risk complications.   Gardiner Barefoot DPM

## 2021-04-08 ENCOUNTER — Other Ambulatory Visit: Payer: Self-pay

## 2021-04-08 ENCOUNTER — Telehealth (INDEPENDENT_AMBULATORY_CARE_PROVIDER_SITE_OTHER): Payer: Medicare Other | Admitting: Family Medicine

## 2021-04-08 DIAGNOSIS — H9193 Unspecified hearing loss, bilateral: Secondary | ICD-10-CM

## 2021-04-08 NOTE — Progress Notes (Signed)
Patient ID: SRIYANSH NORDMEYER, male   DOB: 1925-04-28, 86 y.o.   MRN: [id number]   This visit type was conducted due to national recommendations for restrictions regarding the COVID-19 pandemic in an effort to limit this patient's exposure and mitigate transmission in our community.   Virtual Visit via Telephone Note  I connected with [name] on 04/08/21 at  2:00 PM EDT by telephone and verified that I am speaking with the correct person using two identifiers.   I discussed the limitations, risks, security and privacy concerns of performing an evaluation and management service by telephone and the availability of in person appointments. I also discussed with the patient that there may be a patient responsible charge related to this service. The patient expressed understanding and agreed to proceed.  Location patient: home Location provider: work or home office Participants present for the call: patient, provider Patient did not have a visit in the prior 7 days to address this/these issue(s).   History of Present Illness:  Concern for bilateral hearing loss especially over the past several months.  His son is noted that he cannot hear well over the telephone and also TV is turned up very loud.  He has had struggles in conversation.  They are not aware that he has had his hearing tested.  They have some concerns regarding whether he may have some cerumen impaction.  Apparently had his ears irrigated years ago for similar problem.  No ear pain.  No ear drainage.  No significant nasal congestion.  Past Medical History:  Diagnosis Date   ABNORMAL THYROID FUNCTION TESTS 01/17/2010   ALLERGIC RHINITIS 12/01/2008   Colon cancer ([place])    COLONIC POLYPS, HX OF 12/01/2008   DIABETES MELLITUS, TYPE II 12/01/2008   EDEMA LEG 05/04/2009   ESSENTIAL HYPERTENSION 12/01/2008   Exocrine pancreatic insufficiency    GALLSTONES 04/07/2009   GERD 12/01/2008   Hay fever    SPONDYLOSIS, LUMBAR 12/01/2008    Past Surgical History:  Procedure Laterality Date   ANKLE SURGERY Right    CATARACT EXTRACTION     CHOLECYSTECTOMY     COLON SURGERY     resection 1990 for cancer   LEG SURGERY Left     reports that he quit smoking about 52 years ago. His smoking use included cigarettes. He has a 20.00 pack-year smoking history. He has never used smokeless tobacco. He reports that he does not drink alcohol and does not use drugs. family history includes Cancer in his mother and another family member; Diabetes in an other family member; Stroke in an other family member. Allergies  Allergen Reactions   Amoxicillin     REACTION: rash, dizziness      Observations/Objective: Patient sounds cheerful and well on the phone. I do not appreciate any SOB. Speech and thought processing are grossly intact. Patient reported vitals:  Assessment and Plan:  Concern for bilateral hearing loss.  Patient and son have concern regarding possible cerumen impaction  -Set up office follow-up if possible next week to further assess.  If no evidence for cerumen we will recommend audiology assessment at that time.  Follow Up Instructions:   [id number] [id number] [id number] 11-20 [id number] 21-30 I did not refer this patient for an OV in the next 24 hours for this/these issue(s).  I discussed the assessment and treatment plan with the patient. The patient was provided an opportunity to ask questions and all were answered. The patient agreed with the plan and demonstrated an understanding of  the instructions.   The patient was advised to call back or seek an in-person evaluation if the symptoms worsen or if the condition fails to improve as anticipated.  I provided 11 minutes of non-face-to-face time during this encounter.   Carolann Littler, MD

## 2021-04-13 ENCOUNTER — Ambulatory Visit (INDEPENDENT_AMBULATORY_CARE_PROVIDER_SITE_OTHER): Payer: Medicare Other | Admitting: Family Medicine

## 2021-04-13 ENCOUNTER — Other Ambulatory Visit: Payer: Self-pay

## 2021-04-13 VITALS — BP 122/58 | HR 69 | Temp 97.6°F | Ht 68.0 in | Wt 172.8 lb

## 2021-04-13 DIAGNOSIS — H6123 Impacted cerumen, bilateral: Secondary | ICD-10-CM | POA: Diagnosis not present

## 2021-04-13 MED ORDER — ONETOUCH ULTRA 2 W/DEVICE KIT: PACK | 0 refills | Status: DC

## 2021-04-13 NOTE — Progress Notes (Signed)
cr  Established Patient Office Visit  Subjective:  Patient ID: Steven Ward, male    DOB: [birth year]  Age: 87 y.o. MRN: [id number]  CC:  Chief Complaint  Patient presents with   Follow-up    HPI Steven Ward presents for here for bilateral hearing loss.  We had done virtual visit last week.  We felt like we needed to rule out cerumen impaction as a first step.  He has had cerumen buildup in the past.  No recent ear drainage.  No ear pain.  No recent audiometry.  No nasal congestion.  Has not tried any ear drops.  Past Medical History:  Diagnosis Date   ABNORMAL THYROID FUNCTION TESTS 01/17/2010   ALLERGIC RHINITIS 12/01/2008   Colon cancer ([place])    COLONIC POLYPS, HX OF 12/01/2008   DIABETES MELLITUS, TYPE II 12/01/2008   EDEMA LEG 05/04/2009   ESSENTIAL HYPERTENSION 12/01/2008   Exocrine pancreatic insufficiency    GALLSTONES 04/07/2009   GERD 12/01/2008   Hay fever    SPONDYLOSIS, LUMBAR 12/01/2008    Past Surgical History:  Procedure Laterality Date   ANKLE SURGERY Right    CATARACT EXTRACTION     CHOLECYSTECTOMY     COLON SURGERY     resection 1990 for cancer   LEG SURGERY Left     Family History  Problem Relation Age of Onset   Cancer Other        colon   Diabetes Other    Stroke Other    Cancer Mother     Social History   Socioeconomic History   Marital status: Widowed    Spouse name: Not on file   Number of children: 2   Years of education: Not on file   Highest education level: Not on file  Occupational History   Occupation: retired    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  Tobacco Use   Smoking status: Former    Packs/day: 1.00    Years: 20.00    Pack years: 20.00    Types: Cigarettes    Quit date: 12/28/1968    Years since quitting: 52.3   Smokeless tobacco: Never  Vaping Use   Vaping Use: Never used  Substance and Sexual Activity   Alcohol use: No   Drug use: No   Sexual activity: Not on file  Other Topics Concern    Not on file  Social History Narrative   R handed   Lives with son    Social Determinants of Health   Financial Resource Strain: Low Risk    Difficulty of Paying Living Expenses: Not hard at all  Food Insecurity: Not on file  Transportation Needs: No Transportation Needs   Lack of Transportation (Medical): No   Lack of Transportation (Non-Medical): No  Physical Activity: Not on file  Stress: Not on file  Social Connections: Not on file  Intimate Partner Violence: Not on file    Outpatient Medications Prior to Visit  Medication Sig Dispense Refill   aspirin 81 MG EC tablet Take 1 tablet by mouth daily.     blood glucose meter kit and supplies KIT Dispense based on patient and insurance preference. Use up to four times daily as directed. (FOR ICD-9 250.00, 250.01). 1 each 0   Blood Glucose Monitoring Suppl (ONE TOUCH ULTRA 2) w/Device KIT Use as directed. 1 kit 0   Carbidopa-Levodopa ER (SINEMET CR) 25-100 MG tablet controlled release 2 in the AM, 2 in the  afternoon, 1 in the evening 450 tablet 1   glucose blood (ONETOUCH ULTRA) test strip Test blood sugar 3 times a day 300 each 1   glyBURIDE (DIABETA) 2.5 MG tablet Take 1 tablet (2.5 mg total) by mouth daily with breakfast. 90 tablet 3   hydrochlorothiazide (MICROZIDE) 12.5 MG capsule TAKE 1 CAPSULE BY MOUTH EVERY DAY 90 capsule 1   Lancet Device MISC Use 1-4 times daily as needed or directed.  DX E11.9 1 each 3   Lancets (ONETOUCH ULTRASOFT) lancets Check 3 times daily. E11.9 300 each 3   lipase/protease/amylase (CREON) 12000-38000 units CPEP capsule Take by mouth 3 (three) times daily before meals.     polyethylene glycol (MIRALAX / GLYCOLAX) packet Take 17 g by mouth daily as needed for mild constipation.     tamsulosin (FLOMAX) 0.4 MG CAPS capsule TAKE ONE CAPSULE BY MOUTH ONCE DAILY. 90 capsule 1   triamcinolone cream (KENALOG) 0.1 % APPLY  CREAM EXTERNALLY TO AFFECTED AREA TWICE DAILY AS NEEDED 80 g 2   No  facility-administered medications prior to visit.    Allergies  Allergen Reactions   Amoxicillin     REACTION: rash, dizziness    ROS Review of Systems  Constitutional:  Negative for chills and fever.  HENT:  Positive for hearing loss. Negative for ear discharge and ear pain.      Objective:    Physical Exam Vitals reviewed.  Constitutional:      Appearance: Normal appearance.  HENT:     Ears:     Comments: Cerumen impactions bilaterally Cardiovascular:     Rate and Rhythm: Normal rate and regular rhythm.  Neurological:     Mental Status: He is alert.    BP (!) 122/58   Pulse 69   Temp 97.6 F (36.4 C) (Oral)   Ht _0  (1.727 m)   Wt 172 lb 12.8 oz (78.4 kg)   SpO2 96%   BMI 26.27 kg/m  Wt Readings from Last 3 Encounters:  04/13/21 172 lb 12.8 oz (78.4 kg)  02/08/21 177 lb 6.4 oz (80.5 kg)  10/12/20 175 lb (79.4 kg)     Health Maintenance Due  Topic Date Due   Zoster Vaccines- Shingrix (1 of 2) Never done   OPHTHALMOLOGY EXAM  04/24/2018   COVID-19 Vaccine (4 - Booster) 11/21/2020    There are no preventive care reminders to display for this patient.  Lab Results  Component Value Date   TSH 4.68 (H) 08/05/2019   Lab Results  Component Value Date   WBC 8.6 04/19/2016   HGB 13.6 04/19/2016   HCT 40.9 04/19/2016   MCV 94.2 04/19/2016   PLT 205.0 04/19/2016   Lab Results  Component Value Date   NA 137 10/12/2020   K 4.0 10/12/2020   CO2 26 10/12/2020   GLUCOSE 134 (H) 10/12/2020   BUN 45 (H) 10/12/2020   CREATININE 1.94 (H) 10/12/2020   BILITOT 0.6 04/02/2019   ALKPHOS 73 04/02/2019   AST 11 04/02/2019   ALT 4 04/02/2019   PROT 6.6 04/02/2019   ALBUMIN 3.9 04/02/2019   CALCIUM 9.3 10/12/2020   GFR 29.25 (L) 10/12/2020   Lab Results  Component Value Date   CHOL 140 04/02/2019   Lab Results  Component Value Date   HDL 39.20 04/02/2019   Lab Results  Component Value Date   LDLCALC 86 04/02/2019   Lab Results  Component Value  Date   TRIG 76.0 04/02/2019   Lab Results  Component Value Date   CHOLHDL 4 04/02/2019   Lab Results  Component Value Date   HGBA1C 5.7 (A) 02/08/2021      Assessment & Plan:   Bilateral cerumen impactions.  This has affected his hearing.  We discussed risk of removal with irrigation including risk of pain, bleeding, low risk of eardrum perforation.  Patient consents.  Both ears were irrigated with removal of large volume of cerumen bilaterally     He tolerated well.     Some improvement in hearing afterwards.     Follow-up: No follow-ups on file.    Carolann Littler, MD

## 2021-04-20 ENCOUNTER — Telehealth: Payer: Self-pay | Admitting: Pharmacist

## 2021-04-20 NOTE — Chronic Care Management (AMB) (Signed)
    Chronic Care Management Pharmacy Assistant   Name: Steven Ward  MRN: [id number] DOB: Jan 26, 1925  04/20/21-  Called patient to remind of appointment with Jeni Salles) on (04/21/21 at 11am via telephone.)   No answer, left message of appointment date, time and type of appointment (either telephone or in person). Left message to have all medications, supplements, blood pressure and/or blood sugar logs available during appointment and to return call if need to reschedule.  Care Gaps:  AWV - message sent to Ramond Craver CMA to schedule Zoster vaccines - never done Ophthalmology exam - overdue since 04/24/18 Covid -19 vaccine booster 4 -  overdue since 11/21/20 Influenza vaccine - overdue since 04/18/21  Star Rating Drug:  Glyburide 2.'5mg'$  - last filled on 03/30/21 90DS at [facility]  Any gaps in medications fill history? No.  [place]  Clinical Pharmacist Assistant [phone]

## 2021-04-20 NOTE — Progress Notes (Signed)
A user error has taken place: encounter opened in error, closed for administrative reasons.

## 2021-04-21 ENCOUNTER — Ambulatory Visit (INDEPENDENT_AMBULATORY_CARE_PROVIDER_SITE_OTHER): Payer: PPO | Admitting: Pharmacist

## 2021-04-21 DIAGNOSIS — E1121 Type 2 diabetes mellitus with diabetic nephropathy: Secondary | ICD-10-CM

## 2021-04-21 DIAGNOSIS — I10 Essential (primary) hypertension: Secondary | ICD-10-CM

## 2021-04-21 NOTE — Progress Notes (Signed)
Chronic Care Management Pharmacy Note  04/21/2021 Name:  Steven Ward MRN:  [id number] DOB:  1925/04/09  Summary: A1c at goal of < 7%   Recommendations/Changes made from today's visit: -Recommended PT exercises for back pain as this will help in the long term -Consider stopping glipizide if next A1c < 6% regardless of hypoglycemia symptoms as patient may have hypoglycemic unawareness   Plan: DM assessment in 3 months  Subjective: Steven Ward is an 87 y.o. year old male who is a primary patient of Burchette, Elberta Fortis, MD.  The CCM team was consulted for assistance with disease management and care coordination needs.    Engaged with patient by telephone for follow up visit in response to provider referral for pharmacy case management and/or care coordination services.   Consent to Services:  The patient was given information about Chronic Care Management services, agreed to services, and gave verbal consent prior to initiation of services.  Please see initial visit note for detailed documentation.   Patient Care Team: Kristian Covey, MD as PCP - General Tat, Octaviano Batty, DO as Consulting Physician (Neurology) Tat, Octaviano Batty, DO as Consulting Physician (Neurology) Verner Chol, [facility] as Pharmacist (Pharmacist)  Recent office visits: 04/13/21 Evelena Peat, MD: Patient presented for cerumen impaction removal.  04/08/21 Evelena Peat, MD: Patient presented for hearing loss and recommended in person visit to rule out cerumen impaction.  02/08/21 Evelena Peat, MD: Patient presented for DM follow up. No medication changes made.  10/13/20 Telephone encounter: D/c'd Metformin due to kidney function and started back Glyburide 2.5 mg one daily with breakfast. Follow up in 3 months.  10/12/20 Burchette, Elberta Fortis, MD: Patient presented for diabetes follow up.   Recent consult visits: 03/30/21 Helane Gunther, DPM Podiatry: Patient presented for pain due to onychomycosis of  toenail.  12/21/20 Helane Gunther, DPM Podiatry: Patient presented for pain due to onychomycosis of toenail.  08/23/20 [facility] of [employer]: Patient presented for [employer].   Hospital visits: None in previous 6 months  Objective:  Lab Results  Component Value Date   CREATININE 1.94 (H) 10/12/2020   BUN 45 (H) 10/12/2020   GFR 29.25 (L) 10/12/2020   GFRNONAA 51.52 06/22/2009   GFRAA  04/26/2009    >60        The eGFR has been calculated using the MDRD equation. This calculation has not been validated in all clinical situations. eGFR's persistently <60 mL/min signify possible Chronic Kidney Disease.   NA 137 10/12/2020   K 4.0 10/12/2020   CALCIUM 9.3 10/12/2020   CO2 26 10/12/2020   GLUCOSE 134 (H) 10/12/2020    Lab Results  Component Value Date/Time   HGBA1C 5.7 (A) 02/08/2021 10:35 AM   HGBA1C 6.2 (A) 10/12/2020 11:35 AM   HGBA1C 6.8 (H) 04/19/2016 10:20 AM   HGBA1C 7.3 (H) 12/24/2015 08:57 AM   GFR 29.25 (L) 10/12/2020 12:07 PM   GFR 40.26 (L) 01/02/2020 10:36 AM   MICROALBUR 1.1 10/27/2009 10:36 AM    Last diabetic Eye exam:  Lab Results  Component Value Date/Time   HMDIABEYEEXA No Retinopathy 07/12/2015 12:00 AM    Last diabetic Foot exam:  Lab Results  Component Value Date/Time   HMDIABFOOTEX normal 08/25/2015 12:00 AM     Lab Results  Component Value Date   CHOL 140 04/02/2019   HDL 39.20 04/02/2019   LDLCALC 86 04/02/2019   TRIG 76.0 04/02/2019   CHOLHDL 4 04/02/2019    Hepatic Function Latest  Ref Rng & Units 04/02/2019 02/04/2018 04/06/2017  Total Protein 6.0 - 8.3 g/dL 6.6 6.6 6.5  Albumin 3.5 - 5.2 g/dL 3.9 3.7 3.8  AST 0 - 37 U/L $Remo'11 12 14  'BeoXb$ ALT 0 - 53 U/L $Remo'4 5 16  'ZjpAY$ Alk Phosphatase 39 - 117 U/L 73 66 68  Total Bilirubin 0.2 - 1.2 mg/dL 0.6 0.5 0.6  Bilirubin, Direct 0.0 - 0.3 mg/dL 0.1 0.1 0.2    Lab Results  Component Value Date/Time   TSH 4.68 (H) 08/05/2019 11:34 AM   TSH 5.71 (H) 09/24/2017 02:21 PM    CBC  Latest Ref Rng & Units 04/19/2016 04/21/2013 04/26/2009  WBC 4.0 - 10.5 K/uL 8.6 10.6(H) 6.3  Hemoglobin 13.0 - 17.0 g/dL 13.6 13.8 11.9(L)  Hematocrit 39.0 - 52.0 % 40.9 41.0 35.9(L)  Platelets 150.0 - 400.0 K/uL 205.0 200.0 190    Lab Results  Component Value Date/Time   VD25OH 32 04/27/2011 02:02 PM    Clinical ASCVD: No  The ASCVD Risk score Mikey Bussing DC Jr., et al., 2013) failed to calculate for the following reasons:   The 2013 ASCVD risk score is only valid for ages 10 to 24    Depression screen PHQ 2/9 04/13/2021 07/19/2018 06/10/2018  Decreased Interest 0 0 0  Down, Depressed, Hopeless 0 0 0  PHQ - 2 Score 0 0 0  Altered sleeping - 0 -  Tired, decreased energy - 0 -  Change in appetite - 0 -  Feeling bad or failure about yourself  - 0 -  Trouble concentrating - 0 -  Moving slowly or fidgety/restless - 0 -  Suicidal thoughts - 0 -  PHQ-9 Score - 0 -  Difficult doing work/chores - Not difficult at all -     CHA2DS2/VAS Stroke Risk Points  Current as of 2 days ago     4 >= 2 Points: High Risk  1 - 1.99 Points: Medium Risk  0 Points: Low Risk    Last Change: N/A      Details    This score determines the patient's risk of having a stroke if the  patient has atrial fibrillation.       Points Metrics  0 Has Congestive Heart Failure:  No    Current as of 2 days ago  0 Has Vascular Disease:  No    Current as of 2 days ago  1 Has Hypertension:  Yes    Current as of 2 days ago  2 Age:  87    Current as of 2 days ago  1 Has Diabetes:  Yes    Current as of 2 days ago  0 Had Stroke:  No  Had TIA:  No  Had Thromboembolism:  No    Current as of 2 days ago  0 Male:  No    Current as of 2 days ago      Social History   Tobacco Use  Smoking Status Former   Packs/day: 1.00   Years: 20.00   Pack years: 20.00   Types: Cigarettes   Quit date: 12/28/1968   Years since quitting: 52.3  Smokeless Tobacco Never   BP Readings from Last 3 Encounters:  04/13/21 (!) 122/58   02/08/21 140/80  10/12/20 118/70   Pulse Readings from Last 3 Encounters:  04/13/21 69  02/08/21 60  10/12/20 85   Wt Readings from Last 3 Encounters:  04/13/21 172 lb 12.8 oz (78.4 kg)  02/08/21 177 lb 6.4 oz (80.5  kg)  10/12/20 175 lb (79.4 kg)   BMI Readings from Last 3 Encounters:  04/13/21 26.27 kg/m  02/08/21 26.97 kg/m  10/12/20 26.61 kg/m    Assessment/Interventions: Review of patient past medical history, allergies, medications, health status, including review of consultants reports, laboratory and other test data, was performed as part of comprehensive evaluation and provision of chronic care management services.   SDOH:  (Social Determinants of Health) assessments and interventions performed: Yes   SDOH Screenings   Alcohol Screen: Not on file  Depression (PHQ2-9): Low Risk    PHQ-2 Score: 0  Financial Resource Strain: Low Risk    Difficulty of Paying Living Expenses: Not hard at all  Food Insecurity: Not on file  Housing: Not on file  Physical Activity: Not on file  Social Connections: Not on file  Stress: Not on file  Tobacco Use: Medium Risk   Smoking Tobacco Use: Former   Smokeless Tobacco Use: Never  Transportation Needs: No Transportation Needs   Lack of Transportation (Medical): No   Lack of Transportation (Non-Medical): No   Patient reports he doesn't do much as he doesn't drive and lives with his son. He tries to help out a bit around the house.  Patient gets some takeout and eats at home some. He typically eats chicken, fish, and tuna fish sandwiches. He does not eat many fruits and vegetables and has desserts 2-3 times a week. He drinks mostly water during the day.  Patient does not participate in any structured exercise but he does walk up and down the stairs 2-3 times a day.  Patient sleeps pretty well and has no trouble falling asleep or staying asleep. He usually just gets up once during the night to go to the bathroom. He reports he  does sleep a lot and often naps during the day while watching TV.  Patient reports he doesn't have any side effects from his medications and knows what they are all for.  CCM Care Plan  Allergies  Allergen Reactions   Amoxicillin     REACTION: rash, dizziness    Medications Reviewed Today     Reviewed by Gardiner Barefoot, DPM (Physician) on 03/30/21 at [facility] Status: <None>   Medication Order Taking? Sig Documenting Provider Last Dose Status Informant  aspirin 81 MG EC tablet [id number] No Take 1 tablet by mouth daily. [provider] Taking Active   blood glucose meter kit and supplies KIT [id number] No Dispense based on patient and insurance preference. Use up to four times daily as directed. (FOR ICD-9 250.00, 250.01). [name], MD Taking Active   Blood Glucose Monitoring Suppl (ONE TOUCH ULTRA 2) w/Device KIT [id number] No Use as directed. Burchette, Alinda Sierras, MD Taking Active   Carbidopa-Levodopa ER (SINEMET CR) 25-100 MG tablet controlled release [id number] No 2 in the AM, 2 in the afternoon, 1 in the evening Burchette, Alinda Sierras, MD Taking Active   glucose blood (ONETOUCH ULTRA) test strip [id number] No Test blood sugar 3 times a day [name], MD Taking Active   glyBURIDE (DIABETA) 2.5 MG tablet [id number] No Take 1 tablet (2.5 mg total) by mouth daily with breakfast. [name], MD Taking Active   hydrochlorothiazide (MICROZIDE) 12.5 MG capsule [id number] No TAKE 1 CAPSULE BY MOUTH EVERY DAY [name], MD Taking Active   Lancet Device MISC [id number] No Use 1-4 times daily as needed or directed.  DX E11.9 [name], MD Taking Active   Lancets (  ONETOUCH ULTRASOFT) lancets [id number] No Check 3 times daily. E11.9 [name], MD Taking Active   lipase/protease/amylase (CREON) 12000-38000 units CPEP capsule [id number] No Take by mouth 3 (three) times daily before meals. [provider] Taking Active Self  polyethylene  glycol (MIRALAX / GLYCOLAX) packet [id number] No Take 17 g by mouth daily as needed for mild constipation. [provider] Taking Active Self  tamsulosin (FLOMAX) 0.4 MG CAPS capsule [id number] No TAKE ONE CAPSULE BY MOUTH ONCE DAILY. [name], MD Taking Active   triamcinolone cream (KENALOG) 0.1 % [id number] No APPLY  CREAM EXTERNALLY TO AFFECTED AREA TWICE DAILY AS NEEDED [name], MD Taking Active             Patient Active Problem List   Diagnosis Date Noted   At moderate risk for fall 08/05/2019   PD (Parkinson's disease) ([place]) 01/15/2018   Dyslipidemia 04/08/2017   Major depressive episode 04/19/2016   CKD stage 3 due to type 2 diabetes mellitus ([place]) 08/25/2015   Spinal stenosis of lumbar region 05/25/2015   BPH (benign prostatic hyperplasia) 11/10/2013   Bilateral lumbar radiculopathy 11/10/2013   Obesity (BMI 30-39.9) 04/21/2013   Dyspnea 06/23/2011   PAF (paroxysmal atrial fibrillation) ([place]) 06/23/2011   SKIN RASH 01/17/2010   ABNORMAL THYROID FUNCTION TESTS 01/17/2010   EDEMA LEG 05/04/2009   GALLSTONES 04/07/2009   ABDOMINAL PAIN RIGHT UPPER QUADRANT 04/07/2009   DIARRHEA 02/26/2009   Type 2 diabetes mellitus, controlled ([place]) 12/01/2008   Essential hypertension 12/01/2008   ALLERGIC RHINITIS 12/01/2008   GERD 12/01/2008   SPONDYLOSIS, LUMBAR 12/01/2008   COLONIC POLYPS, HX OF 12/01/2008    Immunization History  Administered Date(s) Administered   Fluad Quad(high Dose 65+) 08/10/2020   Influenza Split 08/22/2011   Influenza Whole 06/18/2008, 07/13/2010   Influenza, High Dose Seasonal PF 06/11/2017, 07/19/2018   Influenza, Quadrivalent, Recombinant, Inj, Pf 07/04/2019   Influenza,inj,Quad PF,6+ Mos 05/29/2013   Influenza-Unspecified 06/19/2015   Moderna Sars-Covid-2 Vaccination 10/10/2019   PFIZER(Purple Top)SARS-COV-2 Vaccination 10/10/2019, 11/07/2019, 08/23/2020   Pneumococcal Conjugate-13 07/27/2014   Td 09/18/2006   Tdap  10/14/2013   Zoster, Live 04/10/2012   Patient denies any symptoms of hypoglycemia lately and has not had any readings < 70. He has been eating breakfast consistently but recommended skipping or moving glipizide if he does not eat breakfast. Patient reports he does overeat some but has been losing weight.   Conditions to be addressed/monitored:  Hypertension, Diabetes, Atrial Fibrillation, GERD, Chronic Kidney Disease, Depression, BPH, Allergic Rhinitis and Parkinson's disease  Care Plan : CCM Pharmacy Care Plan  Updates made by Viona Gilmore, [place] since 04/21/2021 12:00 AM     Problem: Problem: Hypertension, Diabetes, Atrial Fibrillation, GERD, Chronic Kidney Disease, Depression, BPH, Allergic Rhinitis and Parkinson's disease      Long-Range Goal: Patient-Specific Goal   Start Date: 01/19/2021  Expected End Date: 01/19/2022  Recent Progress: On track  Priority: High  Note:   Current Barriers:  Unable to independently monitor therapeutic efficacy Unable to self administer medications as prescribed  Pharmacist Clinical Goal(s):  Patient will achieve adherence to monitoring guidelines and medication adherence to achieve therapeutic efficacy through collaboration with PharmD and provider.   Interventions: 1:1 collaboration with [name], MD regarding development and update of comprehensive plan of care as evidenced by provider attestation and co-signature Inter-disciplinary care team collaboration (see longitudinal plan of care) Comprehensive medication review performed; medication list updated in electronic medical record  Hypertension (BP goal <  140/90) -Controlled -Current treatment: Hydrochlorothiazide 12.5 mg 1 capsule daily -Medications previously tried: n/a  -Current home readings: does not monitor at home -Current dietary habits: doesn't eat too much salt; uses very little to season -Current exercise habits: does not exercise -Denies hypotensive/hypertensive  symptoms -Educated on Daily salt intake goal < 2300 mg; Exercise goal of 150 minutes per week; Importance of home blood pressure monitoring; Proper BP monitoring technique; -Counseled to monitor BP at home weekly, document, and provide log at future appointments -Counseled on diet and exercise extensively Recommended to continue current medication  Diabetes (A1c goal <7%) -Controlled -Current medications: Glyburide 2.5 mg 1 tablet daily  -Medications previously tried: metformin (kidney function), Januvia -Current home glucose readings fasting glucose: 105, 98, 115 post prandial glucose: not checking often -Denies hypoglycemic/hyperglycemic symptoms -Current meal patterns:  breakfast: did not discuss  lunch: did not discuss  dinner: when he skips - not usually hungry snacks: did not discuss drinks: did not discuss -Current exercise: does not exercise -Educated on A1c and blood sugar goals; Prevention and management of hypoglycemic episodes; Benefits of routine self-monitoring of blood sugar; -Counseled to check feet daily and get yearly eye exams -Recommended to continue current medication Recommended stopping glipizide if next A1c is in the 5% range  Atrial Fibrillation (Goal: prevent stroke and major bleeding) -Not ideally controlled -CHADSVASC: 4 -Current treatment: Rate control: none Anticoagulation: aspirin 81 mg daily -Medications previously tried: n/a -Home BP and HR readings: does not check  -Counseled on increased risk of stroke due to Afib and benefits of anticoagulation for stroke prevention; -Recommended to take aspirin daily instead of as needed   Parkinson's disease (Goal: minimize symptoms) -Uncontrolled -Current treatment  Sinemet 25-100 mg 2 tablets in the morning, 2 tabs on afternoon and 1 in evening - only taking 2 in the morning and 2 tabs in afternoon -Medications previously tried: none  - Patient reports this is not helping. Recommended to discuss  with neurology.  BPH (Goal: minimize symptoms) -Controlled -Current treatment  Tamsulosin 0.4 mg 1 capsule daily -Medications previously tried: none  -Counseled on the importance of taking this after food to prevent hypotension  Health Maintenance -Vaccine gaps: shingrix, 2nd COVID vaccine booster -Current therapy:  Miralax packet as needed Vitamin B12 1000 mcg 1 tablet daily Vitamin D 5000 units 1 tablet daily  -Educated on Cost vs benefit of each product must be carefully weighed by individual consumer -Patient is satisfied with current therapy and denies issues -Recommended vitamin D level given high dose  Patient Goals/Self-Care Activities Patient will:  - take medications as prescribed focus on medication adherence by using a pillbox check glucose daily, document, and provide at future appointments check blood pressure weekly, document, and provide at future appointments  Follow Up Plan: Telephone follow up appointment with care management team member scheduled for: 6 months       Exercise - walking with a cane outside PT - didn't help; just does enough to keep limber - exercises took up a lot of space  - up to you -feels lousy at the end - did it for a while  Medication Assistance: None required.  Patient affirms current coverage meets needs.  Compliance/Adherence/Medication fill history: Care Gaps: Shingrix, eye exam, COVID booster   Star-Rating Drugs: Glyburide 2.5mg  - [facility]  Patient's preferred pharmacy is:  [id number] [place], [place] - [address] [place]  [place] [address] Phone: [phone] Fax: [phone]  [id number] - [place], [place] [address] Phone: [phone] Fax: [phone]  [facility]) - [place], [place] [address] Phone: [phone] Fax: [phone]  Uses pill box? No - uses a drawer Pt endorses 100% compliance Discussed dispense report compared to patient reporting taking daily. Plan to readdress compliance at follow up.   -uses a vial he fills up - not sure if sinemet is helping- falling asleep too much  We discussed: Current pharmacy is preferred with insurance plan and patient is satisfied with pharmacy services Patient decided to: Continue current medication management strategy  Care Plan and Follow Up Patient Decision:  Patient agrees to Care Plan and Follow-up.  Plan: Telephone follow up appointment with care management team member scheduled for:  6 months  Jeni Salles, PharmD [facility] Pharmacist [facility] [place] [phone]

## 2021-05-05 ENCOUNTER — Other Ambulatory Visit: Payer: Self-pay | Admitting: Family Medicine

## 2021-05-10 ENCOUNTER — Encounter: Payer: Self-pay | Admitting: Family Medicine

## 2021-05-10 ENCOUNTER — Other Ambulatory Visit: Payer: Self-pay

## 2021-05-10 ENCOUNTER — Ambulatory Visit (INDEPENDENT_AMBULATORY_CARE_PROVIDER_SITE_OTHER): Payer: PPO | Admitting: Family Medicine

## 2021-05-10 VITALS — BP 130/60 | HR 64 | Temp 98.0°F | Wt 180.2 lb

## 2021-05-10 DIAGNOSIS — R635 Abnormal weight gain: Secondary | ICD-10-CM

## 2021-05-10 DIAGNOSIS — E1121 Type 2 diabetes mellitus with diabetic nephropathy: Secondary | ICD-10-CM

## 2021-05-10 DIAGNOSIS — I10 Essential (primary) hypertension: Secondary | ICD-10-CM | POA: Diagnosis not present

## 2021-05-10 DIAGNOSIS — R600 Localized edema: Secondary | ICD-10-CM

## 2021-05-10 LAB — POCT GLYCOSYLATED HEMOGLOBIN (HGB A1C): Hemoglobin A1C: 5.9 % — AB (ref 4.0–5.6)

## 2021-05-10 MED ORDER — ONETOUCH ULTRA 2 W/DEVICE KIT: PACK | 0 refills | Status: AC

## 2021-05-10 MED ORDER — FUROSEMIDE 20 MG PO TABS: ORAL_TABLET | ORAL | 1 refills | Status: DC

## 2021-05-10 NOTE — Patient Instructions (Addendum)
Elevate legs frequently  Watch salt intake.  Take the Furosemide 20 mg once tablet daily for 3 days (and hold the HCTZ) and then go back to the HCTZ.   Would consider stopping the Diabeta.

## 2021-05-10 NOTE — Progress Notes (Signed)
Established Patient Office Visit  Subjective:  Patient ID: Steven Ward, male    DOB: 1925/03/04  Age: 87 y.o. MRN: [id number]  CC:  Chief Complaint  Patient presents with   Follow-up    diabetes    HPI MCGWIRE DASARO presents for medical follow-up.  He has history of hypertension, past history of A. fib, chronic kidney disease stage III, type 2 diabetes, lumbar stenosis, Parkinson's disease.  His A1c has been excellent.  He remains on low-dose Glucotrol 2.5 mg daily.  We had discussed in the past stopping this but patient has been very reluctant.  He denies any hypoglycemic symptoms.  A1c remains well controlled today at 5.9%.  We have stopped his metformin because of chronic kidney disease issues  His weight is up today about 8 pounds from last visit.  He states he has not made dietary changes.  No dyspnea.  He has been prone to leg edema in the past.  Takes HCTZ 12.5 mg daily for hypertension.  Recent renal function stable.  He has severe lumbar stenosis which limits his physical activity.  He has not benefited from physical therapy in the past.  He is not a surgical candidate.  Past Medical History:  Diagnosis Date   ABNORMAL THYROID FUNCTION TESTS 01/17/2010   ALLERGIC RHINITIS 12/01/2008   Colon cancer (HCC)    COLONIC POLYPS, HX OF 12/01/2008   DIABETES MELLITUS, TYPE II 12/01/2008   EDEMA LEG 05/04/2009   ESSENTIAL HYPERTENSION 12/01/2008   Exocrine pancreatic insufficiency    GALLSTONES 04/07/2009   GERD 12/01/2008   Hay fever    SPONDYLOSIS, LUMBAR 12/01/2008    Past Surgical History:  Procedure Laterality Date   ANKLE SURGERY Right    CATARACT EXTRACTION     CHOLECYSTECTOMY     COLON SURGERY     resection 1990 for cancer   LEG SURGERY Left     Family History  Problem Relation Age of Onset   Cancer Other        colon   Diabetes Other    Stroke Other    Cancer Mother     Social History   Socioeconomic History   Marital status: Widowed    Spouse name:  Not on file   Number of children: 2   Years of education: Not on file   Highest education level: Not on file  Occupational History   Occupation: retired    Associate Professor: [employer]    Comment: bus driver  Tobacco Use   Smoking status: Former    Packs/day: 1.00    Years: 20.00    Pack years: 20.00    Types: Cigarettes    Quit date: 12/28/1968    Years since quitting: 52.4   Smokeless tobacco: Never  Vaping Use   Vaping Use: Never used  Substance and Sexual Activity   Alcohol use: No   Drug use: No   Sexual activity: Not on file  Other Topics Concern   Not on file  Social History Narrative   R handed   Lives with son    Social Determinants of Health   Financial Resource Strain: Low Risk    Difficulty of Paying Living Expenses: Not hard at all  Food Insecurity: Not on file  Transportation Needs: No Transportation Needs   Lack of Transportation (Medical): No   Lack of Transportation (Non-Medical): No  Physical Activity: Not on file  Stress: Not on file  Social Connections: Not on file  Intimate  Partner Violence: Not on file    Outpatient Medications Prior to Visit  Medication Sig Dispense Refill   aspirin 81 MG EC tablet Take 1 tablet by mouth daily.     blood glucose meter kit and supplies KIT Dispense based on patient and insurance preference. Use up to four times daily as directed. (FOR ICD-9 250.00, 250.01). 1 each 0   Carbidopa-Levodopa ER (SINEMET CR) 25-100 MG tablet controlled release 2 in the AM, 2 in the afternoon, 1 in the evening 450 tablet 1   glucose blood (ONETOUCH ULTRA) test strip Test blood sugar 3 times a day 300 each 1   glyBURIDE (DIABETA) 2.5 MG tablet Take 1 tablet (2.5 mg total) by mouth daily with breakfast. 90 tablet 3   hydrochlorothiazide (MICROZIDE) 12.5 MG capsule TAKE 1 CAPSULE BY MOUTH EVERY DAY 90 capsule 1   Lancet Device MISC Use 1-4 times daily as needed or directed.  DX E11.9 1 each 3   Lancets (ONETOUCH ULTRASOFT) lancets  Check 3 times daily. E11.9 300 each 3   lipase/protease/amylase (CREON) 12000-38000 units CPEP capsule Take by mouth 3 (three) times daily before meals.     polyethylene glycol (MIRALAX / GLYCOLAX) packet Take 17 g by mouth daily as needed for mild constipation.     tamsulosin (FLOMAX) 0.4 MG CAPS capsule TAKE 1 CAPSULE BY MOUTH EVERY DAY 90 capsule 1   triamcinolone cream (KENALOG) 0.1 % APPLY  CREAM EXTERNALLY TO AFFECTED AREA TWICE DAILY AS NEEDED 80 g 2   Blood Glucose Monitoring Suppl (ONE TOUCH ULTRA 2) w/Device KIT Use as directed. 1 kit 0   No facility-administered medications prior to visit.    Allergies  Allergen Reactions   Amoxicillin     REACTION: rash, dizziness    ROS Review of Systems  Constitutional:  Negative for fatigue.  Eyes:  Negative for visual disturbance.  Respiratory:  Negative for cough, chest tightness and shortness of breath.   Cardiovascular:  Positive for leg swelling. Negative for chest pain and palpitations.  Endocrine: Negative for polydipsia and polyuria.  Musculoskeletal:  Positive for back pain.  Neurological:  Negative for dizziness, syncope, weakness, light-headedness and headaches.     Objective:    Physical Exam Constitutional:      Appearance: He is well-developed.  HENT:     Right Ear: External ear normal.     Left Ear: External ear normal.  Eyes:     Pupils: Pupils are equal, round, and reactive to light.  Neck:     Thyroid: No thyromegaly.  Cardiovascular:     Rate and Rhythm: Normal rate and regular rhythm.  Pulmonary:     Effort: Pulmonary effort is normal. No respiratory distress.     Breath sounds: Normal breath sounds. No wheezing or rales.  Musculoskeletal:     Cervical back: Neck supple.     Right lower leg: Edema present.     Left lower leg: Edema present.     Comments: He has 1+ to 2+ pitting edema lower legs bilaterally.  No leg ulcerations.  Neurological:     Mental Status: He is alert and oriented to person,  place, and time.    BP 130/60 (BP Location: Left Arm, Patient Position: Sitting, Cuff Size: Normal)   Pulse 64   Temp 98 F (36.7 C) (Oral)   Wt 180 lb 3.2 oz (81.7 kg)   SpO2 98%   BMI 27.40 kg/m  Wt Readings from Last 3 Encounters:  05/10/21 180 lb  3.2 oz (81.7 kg)  04/13/21 172 lb 12.8 oz (78.4 kg)  02/08/21 177 lb 6.4 oz (80.5 kg)     Health Maintenance Due  Topic Date Due   Zoster Vaccines- Shingrix (1 of 2) Never done   OPHTHALMOLOGY EXAM  04/24/2018   COVID-19 Vaccine (4 - Booster) 11/21/2020   INFLUENZA VACCINE  04/18/2021    There are no preventive care reminders to display for this patient.  Lab Results  Component Value Date   TSH 4.68 (H) 08/05/2019   Lab Results  Component Value Date   WBC 8.6 04/19/2016   HGB 13.6 04/19/2016   HCT 40.9 04/19/2016   MCV 94.2 04/19/2016   PLT 205.0 04/19/2016   Lab Results  Component Value Date   NA 137 10/12/2020   K 4.0 10/12/2020   CO2 26 10/12/2020   GLUCOSE 134 (H) 10/12/2020   BUN 45 (H) 10/12/2020   CREATININE 1.94 (H) 10/12/2020   BILITOT 0.6 04/02/2019   ALKPHOS 73 04/02/2019   AST 11 04/02/2019   ALT 4 04/02/2019   PROT 6.6 04/02/2019   ALBUMIN 3.9 04/02/2019   CALCIUM 9.3 10/12/2020   GFR 29.25 (L) 10/12/2020   Lab Results  Component Value Date   CHOL 140 04/02/2019   Lab Results  Component Value Date   HDL 39.20 04/02/2019   Lab Results  Component Value Date   LDLCALC 86 04/02/2019   Lab Results  Component Value Date   TRIG 76.0 04/02/2019   Lab Results  Component Value Date   CHOLHDL 4 04/02/2019   Lab Results  Component Value Date   HGBA1C 5.9 (A) 05/10/2021      Assessment & Plan:   #1 type 2 diabetes which remains well controlled with A1c 5.9%  -We recommended that he stop Glucotrol but patient remains very reluctant.  We discussed risk of hypoglycemia especially at his age. -We will plan routine follow-up for his diabetes in 6 months  #2 peripheral edema with weight  gain of 8 pounds since last visit.  Patient in no respiratory distress.  -Elevate legs frequently -Try to keep sodium intake less than 2500 mg daily -Short-term use of furosemide 20 mg once daily for 3 days and try backing off.  Monitor daily weights.  #3 hypertension stable and well-controlled.  Continue low-dose HCTZ 12.5 mg daily   Meds ordered this encounter  Medications   Blood Glucose Monitoring Suppl (ONE TOUCH ULTRA 2) w/Device KIT    Sig: Use as directed.    Dispense:  1 kit    Refill:  0   furosemide (LASIX) 20 MG tablet    Sig: Take one tablet by mouth once daily as needed for edema/swelling.    Dispense:  30 tablet    Refill:  1    Follow-up: Return in about 6 months (around 11/10/2021).    Carolann Littler, MD

## 2021-05-25 ENCOUNTER — Ambulatory Visit (INDEPENDENT_AMBULATORY_CARE_PROVIDER_SITE_OTHER): Payer: PPO

## 2021-05-25 ENCOUNTER — Other Ambulatory Visit: Payer: Self-pay

## 2021-05-25 DIAGNOSIS — Z0000 Encounter for general adult medical examination without abnormal findings: Secondary | ICD-10-CM

## 2021-05-25 NOTE — Progress Notes (Addendum)
Virtual Visit via Telephone Note  I connected with  Evelyn Moch Lack on 05/25/21 at  1:45 PM EDT by telephone and verified that I am speaking with the correct person using two identifiers.  Medicare Annual Wellness visit completed telephonically due to Covid-19 pandemic.   Persons participating in this call: This Health Coach and this patient.   Location: Patient: Home Provider: Office   I discussed the limitations, risks, security and privacy concerns of performing an evaluation and management service by telephone and the availability of in person appointments. The patient expressed understanding and agreed to proceed.  Unable to perform video visit due to video visit attempted and failed and/or patient does not have video capability.   Some vital signs may be absent or patient reported.   Marzella Schlein, LPN   Subjective:   PHINNEAS SHAKOOR is a 87 y.o. male who presents for Medicare Annual/Subsequent preventive examination.  Review of Systems           Objective:    Today's Vitals   05/25/21 1347  PainSc: 5    There is no height or weight on file to calculate BMI.  Advanced Directives 05/25/2021 07/16/2019 12/03/2018 05/10/2017 02/08/2016  Does Patient Have a Medical Advance Directive? No Yes No No No  Would patient like information on creating a medical advance directive? No - Patient declined - - - Yes - Educational materials given    Current Medications (verified) Outpatient Encounter Medications as of 05/25/2021  Medication Sig   blood glucose meter kit and supplies KIT Dispense based on patient and insurance preference. Use up to four times daily as directed. (FOR ICD-9 250.00, 250.01).   Blood Glucose Monitoring Suppl (ONE TOUCH ULTRA 2) w/Device KIT Use as directed.   Carbidopa-Levodopa ER (SINEMET CR) 25-100 MG tablet controlled release 2 in the AM, 2 in the afternoon, 1 in the evening   furosemide (LASIX) 20 MG tablet Take one tablet by mouth once daily as  needed for edema/swelling.   glucose blood (ONETOUCH ULTRA) test strip Test blood sugar 3 times a day   glyBURIDE (DIABETA) 2.5 MG tablet Take 1 tablet (2.5 mg total) by mouth daily with breakfast.   hydrochlorothiazide (MICROZIDE) 12.5 MG capsule TAKE 1 CAPSULE BY MOUTH EVERY DAY   Lancet Device MISC Use 1-4 times daily as needed or directed.  DX E11.9   Lancets (ONETOUCH ULTRASOFT) lancets Check 3 times daily. E11.9   lipase/protease/amylase (CREON) 12000-38000 units CPEP capsule Take by mouth 3 (three) times daily before meals.   polyethylene glycol (MIRALAX / GLYCOLAX) packet Take 17 g by mouth daily as needed for mild constipation.   tamsulosin (FLOMAX) 0.4 MG CAPS capsule TAKE 1 CAPSULE BY MOUTH EVERY DAY   triamcinolone cream (KENALOG) 0.1 % APPLY  CREAM EXTERNALLY TO AFFECTED AREA TWICE DAILY AS NEEDED   [DISCONTINUED] aspirin 81 MG EC tablet Take 1 tablet by mouth daily. (Patient not taking: Reported on 05/25/2021)   No facility-administered encounter medications on file as of 05/25/2021.    Allergies (verified) Amoxicillin   History: Past Medical History:  Diagnosis Date   ABNORMAL THYROID FUNCTION TESTS 01/17/2010   ALLERGIC RHINITIS 12/01/2008   Colon cancer (HCC)    COLONIC POLYPS, HX OF 12/01/2008   DIABETES MELLITUS, TYPE II 12/01/2008   EDEMA LEG 05/04/2009   ESSENTIAL HYPERTENSION 12/01/2008   Exocrine pancreatic insufficiency    GALLSTONES 04/07/2009   GERD 12/01/2008   Hay fever    SPONDYLOSIS, LUMBAR 12/01/2008  Past Surgical History:  Procedure Laterality Date   ANKLE SURGERY Right    CATARACT EXTRACTION     CHOLECYSTECTOMY     COLON SURGERY     resection 1990 for cancer   LEG SURGERY Left    Family History  Problem Relation Age of Onset   Cancer Other        colon   Diabetes Other    Stroke Other    Cancer Mother    Social History   Socioeconomic History   Marital status: Widowed    Spouse name: Not on file   Number of children: 2   Years of  education: Not on file   Highest education level: Not on file  Occupational History   Occupation: retired    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  Tobacco Use   Smoking status: Former    Packs/day: 1.00    Years: 20.00    Pack years: 20.00    Types: Cigarettes    Quit date: 12/28/1968    Years since quitting: 52.4   Smokeless tobacco: Never  Vaping Use   Vaping Use: Never used  Substance and Sexual Activity   Alcohol use: No   Drug use: No   Sexual activity: Not on file  Other Topics Concern   Not on file  Social History Narrative   R handed   Lives with son    Social Determinants of Health   Financial Resource Strain: Low Risk    Difficulty of Paying Living Expenses: Not hard at all  Food Insecurity: No Food Insecurity   Worried About Charity fundraiser in the Last Year: Never true   [place] in the Last Year: Never true  Transportation Needs: No Transportation Needs   Lack of Transportation (Medical): No   Lack of Transportation (Non-Medical): No  Physical Activity: Inactive   Days of Exercise per Week: 0 days   Minutes of Exercise per Session: 0 min  Stress: No Stress Concern Present   Feeling of Stress : Not at all  Social Connections: Socially Isolated   Frequency of Communication with Friends and Family: Never   Frequency of Social Gatherings with Friends and Family: Once a week   Attends Religious Services: Never   Marine scientist or Organizations: No   Attends Archivist Meetings: Never   Marital Status: Widowed    Tobacco Counseling Counseling given: Not Answered   Clinical Intake:  Pre-visit preparation completed: Yes  Pain : 0-10 Pain Score: 5  Pain Type: Chronic pain Pain Location: Back (and legs)     BMI - recorded: 27.41 Nutritional Status: BMI 25 -29 Overweight Nutritional Risks: None Diabetes: Yes CBG done?: Yes (101) CBG resulted in Enter/ Edit results?: No Did pt. bring in CBG monitor  from home?: No  How often do you need to have someone help you when you read instructions, pamphlets, or other written materials from your doctor or pharmacy?: 1 - Never  Diabetic?Nutrition Risk Assessment:  Has the patient had any N/V/D within the last 2 months?  No  Does the patient have any non-healing wounds?  No  Has the patient had any unintentional weight loss or weight gain?  No   Diabetes:  Is the patient diabetic?  Yes  If diabetic, was a CBG obtained today?  Yes  Did the patient bring in their glucometer from home?  No  How often do you monitor your CBG's? Daily.  Financial Strains and Diabetes Management:  Are you having any financial strains with the device, your supplies or your medication? No .  Does the patient want to be seen by Chronic Care Management for management of their diabetes?  No  Would the patient like to be referred to a Nutritionist or for Diabetic Management?  No   Diabetic Exams:  Diabetic Eye Exam: Overdue for diabetic eye exam. Pt has been advised about the importance in completing this exam. Patient advised to call and schedule an eye exam. Diabetic Foot Exam: Completed 12/21/20   Interpreter Needed?: No  Information entered by :: Charlott Rakes, LPN   Activities of Daily Living In your present state of health, do you have any difficulty performing the following activities: 10/12/2020  Hearing? Y  Vision? Y  Difficulty concentrating or making decisions? Y  Walking or climbing stairs? Y  Dressing or bathing? Y  Doing errands, shopping? Y  Some recent data might be hidden    Patient Care Team: [name], MD as PCP - General Tat, Eustace Quail, DO as Consulting Physician (Neurology) Tat, Eustace Quail, DO as Consulting Physician (Neurology) Viona Gilmore, [facility] as Pharmacist (Pharmacist)  Indicate any recent Medical Services you may have received from other than [facility] providers in the past year (date may be approximate).      Assessment:   This is a routine wellness examination for [place].  Hearing/Vision screen Hearing Screening - Comments:: Pt denies any hearing issues Vision Screening - Comments:: Encouraged to follow up eye exams   Dietary issues and exercise activities discussed:     Goals Addressed             This Visit's Progress    Patient Stated       NONE AT THIS TIME       Depression Screen PHQ 2/9 Scores 05/25/2021 04/13/2021 07/19/2018 06/10/2018 05/10/2017 12/05/2016 08/07/2016  PHQ - 2 Score 0 0 0 0 0 0 0  PHQ- 9 Score - - 0 - - - -    Fall Risk Fall Risk  05/25/2021 04/13/2021 10/12/2020 08/05/2019 07/16/2019  Falls in the past year? 0 0 $R'1 1 1  'HG$ Number falls in past yr: 0 0 0 1 0  Injury with Fall? 0 0 0 0 0  Risk Factor Category  - - - - -  Risk for fall due to : Impaired mobility;Impaired balance/gait;Impaired vision - - History of fall(s);Impaired mobility;Medication side effect;Impaired balance/gait -  Follow up Falls prevention discussed Falls evaluation completed - Education provided -  Comment - - - Setting up home physical therapy.  Cane use at all times -    FALL RISK PREVENTION PERTAINING TO THE HOME:  Any stairs in or around the home? Yes  If so, are there any without handrails? No  Home free of loose throw rugs in walkways, pet beds, electrical cords, etc? Yes  Adequate lighting in your home to reduce risk of falls? Yes   ASSISTIVE DEVICES UTILIZED TO PREVENT FALLS:  Life alert? No  Use of a cane, walker or w/c? yes Grab bars in the bathroom? No  Shower chair or bench in shower? Yes  Elevated toilet seat or a handicapped toilet? No   TIMED UP AND GO:  Was the test performed? No .  Cognitive Function:        Immunizations Immunization History  Administered Date(s) Administered   Fluad Quad(high Dose 65+) 08/10/2020   Influenza Split 08/22/2011, 05/22/2016   Influenza  Whole 06/18/2008, 07/13/2010   Influenza, High Dose Seasonal PF 06/03/2012, 06/18/2014,  05/20/2015, 05/19/2017, 06/11/2017, 07/19/2018   Influenza, Quadrivalent, Recombinant, Inj, Pf 07/04/2019   Influenza,inj,Quad PF,6+ Mos 05/29/2013   Influenza-Unspecified 07/13/2003, 08/18/2004, 08/18/2005, 06/19/2006, 06/18/2008, 07/19/2009, 06/19/2011, 05/19/2013, 06/19/2015   Moderna Sars-Covid-2 Vaccination 10/10/2019, 11/07/2019, 08/23/2020   PFIZER(Purple Top)SARS-COV-2 Vaccination 10/10/2019, 11/07/2019, 08/23/2020   Pneumococcal Conjugate-13 07/27/2014, 05/31/2016   Pneumococcal-Unspecified 09/19/1999   Td 09/18/2006   Tdap 10/14/2013, 05/31/2016   Zoster, Live 04/10/2012, 10/19/2015    TDAP status: Up to date  Flu Vaccine status: Due, Education has been provided regarding the importance of this vaccine. Advised may receive this vaccine at local pharmacy or Health Dept. Aware to provide a copy of the vaccination record if obtained from local pharmacy or Health Dept. Verbalized acceptance and understanding.  Pneumococcal vaccine status: Up to date  Covid-19 vaccine status: Completed vaccines  Qualifies for Shingles Vaccine? Yes   Zostavax completed Yes   Shingrix Completed?: No.    Education has been provided regarding the importance of this vaccine. Patient has been advised to call insurance company to determine out of pocket expense if they have not yet received this vaccine. Advised may also receive vaccine at local pharmacy or Health Dept. Verbalized acceptance and understanding.  Screening Tests Health Maintenance  Topic Date Due   Zoster Vaccines- Shingrix (1 of 2) Never done   OPHTHALMOLOGY EXAM  04/24/2018   COVID-19 Vaccine (4 - Booster) 11/21/2020   INFLUENZA VACCINE  04/18/2021   HEMOGLOBIN A1C  11/10/2021   FOOT EXAM  12/21/2021   TETANUS/TDAP  05/31/2026   PNA vac Low Risk Adult  Completed   HPV VACCINES  Aged Out    Health Maintenance  Health Maintenance Due  Topic Date Due   Zoster Vaccines- Shingrix (1 of 2) Never done   OPHTHALMOLOGY EXAM   04/24/2018   COVID-19 Vaccine (4 - Booster) 11/21/2020   INFLUENZA VACCINE  04/18/2021    Colorectal cancer screening: No longer required.   Additional Screening:   Vision Screening: Recommended annual ophthalmology exams for early detection of glaucoma and other disorders of the eye. Is the patient up to date with their annual eye exam?  No  Who is the provider or what is the name of the office in which the patient attends annual eye exams? Encouraged pt to follow up  If pt is not established with a provider, would they like to be referred to a provider to establish care? No .   Dental Screening: Recommended annual dental exams for proper oral hygiene  Community Resource Referral / Chronic Care Management: CRR required this visit?  No   CCM required this visit?  No      Plan:     I have personally reviewed and noted the following in the patient's chart:   Medical and social history Use of alcohol, tobacco or illicit drugs  Current medications and supplements including opioid prescriptions. Patient is not currently taking opioid prescriptions. Functional ability and status Nutritional status Physical activity Advanced directives List of other physicians Hospitalizations, surgeries, and ER visits in previous 12 months Vitals Screenings to include cognitive, depression, and falls Referrals and appointments  In addition, I have reviewed and discussed with patient certain preventive protocols, quality metrics, and best practice recommendations. A written personalized care plan for preventive services as well as general preventive health recommendations were provided to patient.     Willette Brace, LPN   10/27/5186   Nurse Notes: None

## 2021-05-25 NOTE — Patient Instructions (Signed)
Mr. Steven Ward , Thank you for taking time to come for your Medicare Wellness Visit. I appreciate your ongoing commitment to your health goals. Please review the following plan we discussed and let me know if I can assist you in the future.   Screening recommendations/referrals: Colonoscopy: no longer required  Recommended yearly ophthalmology/optometry visit for glaucoma screening and checkup Recommended yearly dental visit for hygiene and checkup  Vaccinations: Influenza vaccine: Due Pneumococcal vaccine: Completed  Tdap vaccine: Done 10/14/13  Shingles vaccine: Shingrix discussed. Please contact your pharmacy for coverage information.    Covid-19: Completed 1/22, 2/19, & 08/23/20  Advanced directives: Advance directive discussed with you today. Even though you declined this today please call our office should you change your mind and we can give you the proper paperwork for you to fill out.  Conditions/risks identified: None at this time  Next appointment: Follow up in one year for your annual wellness visit.   Preventive Care 16 Years and Older, Male Preventive care refers to lifestyle choices and visits with your health care provider that can promote health and wellness. What does preventive care include? A yearly physical exam. This is also called an annual well check. Dental exams once or twice a year. Routine eye exams. Ask your health care provider how often you should have your eyes checked. Personal lifestyle choices, including: Daily care of your teeth and gums. Regular physical activity. Eating a healthy diet. Avoiding tobacco and drug use. Limiting alcohol use. Practicing safe sex. Taking low doses of aspirin every day. Taking vitamin and mineral supplements as recommended by your health care provider. What happens during an annual well check? The services and screenings done by your health care provider during your annual well check will depend on your age, overall  health, lifestyle risk factors, and family history of disease. Counseling  Your health care provider may ask you questions about your: Alcohol use. Tobacco use. Drug use. Emotional well-being. Home and relationship well-being. Sexual activity. Eating habits. History of falls. Memory and ability to understand (cognition). Work and work Statistician. Screening  You may have the following tests or measurements: Height, weight, and BMI. Blood pressure. Lipid and cholesterol levels. These may be checked every 5 years, or more frequently if you are over 19 years old. Skin check. Lung cancer screening. You may have this screening every year starting at age 28 if you have a 30-pack-year history of smoking and currently smoke or have quit within the past 15 years. Fecal occult blood test (FOBT) of the stool. You may have this test every year starting at age 66. Flexible sigmoidoscopy or colonoscopy. You may have a sigmoidoscopy every 5 years or a colonoscopy every 10 years starting at age 54. Prostate cancer screening. Recommendations will vary depending on your family history and other risks. Hepatitis C blood test. Hepatitis B blood test. Sexually transmitted disease (STD) testing. Diabetes screening. This is done by checking your blood sugar (glucose) after you have not eaten for a while (fasting). You may have this done every 1-3 years. Abdominal aortic aneurysm (AAA) screening. You may need this if you are a current or former smoker. Osteoporosis. You may be screened starting at age 42 if you are at high risk. Talk with your health care provider about your test results, treatment options, and if necessary, the need for more tests. Vaccines  Your health care provider may recommend certain vaccines, such as: Influenza vaccine. This is recommended every year. Tetanus, diphtheria, and acellular pertussis (Tdap,  Td) vaccine. You may need a Td booster every 10 years. Zoster vaccine. You may  need this after age 87. Pneumococcal 13-valent conjugate (PCV13) vaccine. One dose is recommended after age 64. Pneumococcal polysaccharide (PPSV23) vaccine. One dose is recommended after age 43. Talk to your health care provider about which screenings and vaccines you need and how often you need them. This information is not intended to replace advice given to you by your health care provider. Make sure you discuss any questions you have with your health care provider. Document Released: 10/01/2015 Document Revised: 05/24/2016 Document Reviewed: 07/06/2015 Elsevier Interactive Patient Education  2017 [place] Prevention in the Home Falls can cause injuries. They can happen to people of all ages. There are many things you can do to make your home safe and to help prevent falls. What can I do on the outside of my home? Regularly fix the edges of walkways and driveways and fix any cracks. Remove anything that might make you trip as you walk through a door, such as a raised step or threshold. Trim any bushes or trees on the path to your home. Use bright outdoor lighting. Clear any walking paths of anything that might make someone trip, such as rocks or tools. Regularly check to see if handrails are loose or broken. Make sure that both sides of any steps have handrails. Any raised decks and porches should have guardrails on the edges. Have any leaves, snow, or ice cleared regularly. Use sand or salt on walking paths during winter. Clean up any spills in your garage right away. This includes oil or grease spills. What can I do in the bathroom? Use night lights. Install grab bars by the toilet and in the tub and shower. Do not use towel bars as grab bars. Use non-skid mats or decals in the tub or shower. If you need to sit down in the shower, use a plastic, non-slip stool. Keep the floor dry. Clean up any water that spills on the floor as soon as it happens. Remove soap buildup in  the tub or shower regularly. Attach bath mats securely with double-sided non-slip rug tape. Do not have throw rugs and other things on the floor that can make you trip. What can I do in the bedroom? Use night lights. Make sure that you have a light by your bed that is easy to reach. Do not use any sheets or blankets that are too big for your bed. They should not hang down onto the floor. Have a firm chair that has side arms. You can use this for support while you get dressed. Do not have throw rugs and other things on the floor that can make you trip. What can I do in the kitchen? Clean up any spills right away. Avoid walking on wet floors. Keep items that you use a lot in easy-to-reach places. If you need to reach something above you, use a strong step stool that has a grab bar. Keep electrical cords out of the way. Do not use floor polish or wax that makes floors slippery. If you must use wax, use non-skid floor wax. Do not have throw rugs and other things on the floor that can make you trip. What can I do with my stairs? Do not leave any items on the stairs. Make sure that there are handrails on both sides of the stairs and use them. Fix handrails that are broken or loose. Make sure that handrails are as  long as the stairways. Check any carpeting to make sure that it is firmly attached to the stairs. Fix any carpet that is loose or worn. Avoid having throw rugs at the top or bottom of the stairs. If you do have throw rugs, attach them to the floor with carpet tape. Make sure that you have a light switch at the top of the stairs and the bottom of the stairs. If you do not have them, ask someone to add them for you. What else can I do to help prevent falls? Wear shoes that: Do not have high heels. Have rubber bottoms. Are comfortable and fit you well. Are closed at the toe. Do not wear sandals. If you use a stepladder: Make sure that it is fully opened. Do not climb a closed  stepladder. Make sure that both sides of the stepladder are locked into place. Ask someone to hold it for you, if possible. Clearly mark and make sure that you can see: Any grab bars or handrails. First and last steps. Where the edge of each step is. Use tools that help you move around (mobility aids) if they are needed. These include: Canes. Walkers. Scooters. Crutches. Turn on the lights when you go into a dark area. Replace any light bulbs as soon as they burn out. Set up your furniture so you have a clear path. Avoid moving your furniture around. If any of your floors are uneven, fix them. If there are any pets around you, be aware of where they are. Review your medicines with your doctor. Some medicines can make you feel dizzy. This can increase your chance of falling. Ask your doctor what other things that you can do to help prevent falls. This information is not intended to replace advice given to you by your health care provider. Make sure you discuss any questions you have with your health care provider. Document Released: 07/01/2009 Document Revised: 02/10/2016 Document Reviewed: 10/09/2014 Elsevier Interactive Patient Education  2017 [employer].

## 2021-06-02 ENCOUNTER — Other Ambulatory Visit: Payer: Self-pay | Admitting: Family Medicine

## 2021-06-29 ENCOUNTER — Other Ambulatory Visit: Payer: Self-pay | Admitting: Family Medicine

## 2021-07-06 ENCOUNTER — Encounter: Payer: Self-pay | Admitting: Podiatry

## 2021-07-06 ENCOUNTER — Other Ambulatory Visit: Payer: Self-pay

## 2021-07-06 ENCOUNTER — Ambulatory Visit: Payer: PPO | Admitting: Podiatry

## 2021-07-06 DIAGNOSIS — E785 Hyperlipidemia, unspecified: Secondary | ICD-10-CM | POA: Insufficient documentation

## 2021-07-06 DIAGNOSIS — C189 Malignant neoplasm of colon, unspecified: Secondary | ICD-10-CM | POA: Insufficient documentation

## 2021-07-06 DIAGNOSIS — B351 Tinea unguium: Secondary | ICD-10-CM

## 2021-07-06 DIAGNOSIS — E7800 Pure hypercholesterolemia, unspecified: Secondary | ICD-10-CM | POA: Insufficient documentation

## 2021-07-06 DIAGNOSIS — N400 Enlarged prostate without lower urinary tract symptoms: Secondary | ICD-10-CM | POA: Insufficient documentation

## 2021-07-06 DIAGNOSIS — Z23 Encounter for immunization: Secondary | ICD-10-CM | POA: Insufficient documentation

## 2021-07-06 DIAGNOSIS — I4892 Unspecified atrial flutter: Secondary | ICD-10-CM | POA: Insufficient documentation

## 2021-07-06 DIAGNOSIS — E538 Deficiency of other specified B group vitamins: Secondary | ICD-10-CM | POA: Insufficient documentation

## 2021-07-06 DIAGNOSIS — E119 Type 2 diabetes mellitus without complications: Secondary | ICD-10-CM | POA: Diagnosis not present

## 2021-07-06 DIAGNOSIS — D649 Anemia, unspecified: Secondary | ICD-10-CM | POA: Insufficient documentation

## 2021-07-06 DIAGNOSIS — M79676 Pain in unspecified toe(s): Secondary | ICD-10-CM

## 2021-07-06 DIAGNOSIS — K869 Disease of pancreas, unspecified: Secondary | ICD-10-CM | POA: Insufficient documentation

## 2021-07-06 DIAGNOSIS — I739 Peripheral vascular disease, unspecified: Secondary | ICD-10-CM | POA: Insufficient documentation

## 2021-07-06 NOTE — Progress Notes (Signed)
  Subjective:  Patient ID: Steven Ward, male    DOB: 1924-10-11,   MRN: [id number]  No chief complaint on file.   87 y.o. male presents for routine foot care. Doing well.  Last A1c was 5.9  . Denies any other pedal complaints. Denies n/v/f/c.   PCP: Carolann Littler MD   Past Medical History:  Diagnosis Date   ABNORMAL THYROID FUNCTION TESTS 01/17/2010   ALLERGIC RHINITIS 12/01/2008   Colon cancer ([place])    COLONIC POLYPS, HX OF 12/01/2008   DIABETES MELLITUS, TYPE II 12/01/2008   EDEMA LEG 05/04/2009   ESSENTIAL HYPERTENSION 12/01/2008   Exocrine pancreatic insufficiency    GALLSTONES 04/07/2009   GERD 12/01/2008   Hay fever    SPONDYLOSIS, LUMBAR 12/01/2008    Objective:  Physical Exam: Vascular: DP/PT pulses 2/4 bilateral. CFT <3 seconds. Normal hair growth on digits. No edema.  Skin. No lacerations or abrasions bilateral feet. Nails 1-5 are thickened discolored and elongated with subungual debris.  Musculoskeletal: MMT 5/5 bilateral lower extremities in DF, PF, Inversion and Eversion. Deceased ROM in DF of ankle joint.  Neurological: Sensation intact to light touch.   Assessment:   1. Pain due to onychomycosis of toenail   2. Diabetes mellitus without complication ([place])      Plan:  Patient was evaluated and treated and all questions answered. -Discussed and educated patient on diabetic foot care, especially with  regards to the vascular, neurological and musculoskeletal systems.  -Stressed the importance of good glycemic control and the detriment of not  controlling glucose levels in relation to the foot. -Discussed supportive shoes at all times and checking feet regularly.  -Mechanically debrided all nails 1-5 bilateral using sterile nail nipper and filed with dremel without incident  -Answered all patient questions -Patient to return  in 3 months for at risk foot care -Patient advised to call the office if any problems or questions arise in the meantime.   Lorenda Peck, DPM

## 2021-07-13 ENCOUNTER — Telehealth: Payer: Self-pay

## 2021-07-13 NOTE — Telephone Encounter (Signed)
Glyburide was sent in on 10/13/2020 for a year supply. Patient should have refills on file.  Carbidopa-Levodopa was sent in for a 6 month supply on 06/29/2021. To soon to be refilled and should still have 1 additional refill on file.   Left message for patient to call back.

## 2021-07-13 NOTE — Telephone Encounter (Signed)
Patient called requesting Rx refills Carbidopa-Levodopa ER (SINEMET CR) 25-100 MG tablet controlled release glyBURIDE (DIABETA) 2.5 MG tablet

## 2021-07-14 NOTE — Telephone Encounter (Signed)
Left message for patient to call back  

## 2021-07-15 NOTE — Telephone Encounter (Signed)
ATC, unable to leave a message.  

## 2021-07-15 NOTE — Telephone Encounter (Signed)
Unable to reach the patient. Message will be closed.  

## 2021-07-21 ENCOUNTER — Telehealth: Payer: Self-pay | Admitting: Pharmacist

## 2021-07-21 NOTE — Chronic Care Management (AMB) (Signed)
Chronic Care Management Pharmacy Assistant   Name: Steven Ward  MRN: [id number] DOB: 05-21-1925  Reason for Encounter: Disease State / Depression Assessment Call   Conditions to be addressed/monitored: DMII  Recent office visits:  05/25/2021 Charlott Rakes LPN - Medicare annual wellness exam  05/10/2021 Carolann Littler MD (PCP) - Patient was seen for controlled type 2 diabetes mellitus and additional issues. Started Furosemide 20 mg daily. Follow up in 6 months.  Recent consult visits:  07/06/2021 Lorenda Peck MD (podiatry) - Patient was seen for pain due to onychomycosis and an additional issue. No medication changes. Follow up in 3 months.  Hospital visits:  None  Medications: Outpatient Encounter Medications as of 07/21/2021  Medication Sig   blood glucose meter kit and supplies KIT Dispense based on patient and insurance preference. Use up to four times daily as directed. (FOR ICD-9 250.00, 250.01).   Blood Glucose Monitoring Suppl (ONE TOUCH ULTRA 2) w/Device KIT Use as directed.   Carbidopa-Levodopa ER (SINEMET CR) 25-100 MG tablet controlled release TAKE 2 TABLETS BY MOUTH IN THE MORNING AND 2 IN THE AFTERNOON AND 1 IN THE EVENING   furosemide (LASIX) 20 MG tablet Take one tablet by mouth once daily as needed for edema/swelling.   glucose blood (ONETOUCH ULTRA) test strip Test blood sugar 3 times a day   glyBURIDE (DIABETA) 2.5 MG tablet Take 1 tablet (2.5 mg total) by mouth daily with breakfast.   hydrochlorothiazide (MICROZIDE) 12.5 MG capsule TAKE 1 CAPSULE BY MOUTH EVERY DAY   Lancet Device MISC Use 1-4 times daily as needed or directed.  DX E11.9   Lancets (ONETOUCH ULTRASOFT) lancets Check 3 times daily. E11.9   lipase/protease/amylase (CREON) 12000-38000 units CPEP capsule Take by mouth 3 (three) times daily before meals.   polyethylene glycol (MIRALAX / GLYCOLAX) packet Take 17 g by mouth daily as needed for mild constipation.   tamsulosin (FLOMAX) 0.4 MG  CAPS capsule TAKE 1 CAPSULE BY MOUTH EVERY DAY   triamcinolone cream (KENALOG) 0.1 % APPLY  CREAM EXTERNALLY TO AFFECTED AREA TWICE DAILY AS NEEDED   No facility-administered encounter medications on file as of 07/21/2021.   Fill History: CARBIDOPA-LEVO ER 25-100 TAB 07/06/2021 90   FUROSEMIDE 20 MG TABLET 05/10/2021 30   TAMSULOSIN HCL 0.4 MG CAPSULE 05/05/2021 90   GLYBURIDE 2.5 MG TABLET 03/30/2021 90   HYDROCHLOROTHIAZIDE 12.5 MG CP 06/29/2021 90   Recent Relevant Labs: Lab Results  Component Value Date/Time   HGBA1C 5.9 (A) 05/10/2021 10:34 AM   HGBA1C 5.7 (A) 02/08/2021 10:35 AM   HGBA1C 6.8 (H) 04/19/2016 10:20 AM   HGBA1C 7.3 (H) 12/24/2015 08:57 AM   MICROALBUR 1.1 10/27/2009 10:36 AM    Kidney Function Lab Results  Component Value Date/Time   CREATININE 1.94 (H) 10/12/2020 12:07 PM   CREATININE 1.61 (H) 01/02/2020 10:36 AM   GFR 29.25 (L) 10/12/2020 12:07 PM   GFRNONAA 51.52 06/22/2009 11:16 AM   GFRAA  04/26/2009 09:20 AM    >60        The eGFR has been calculated using the MDRD equation. This calculation has not been validated in all clinical situations. eGFR's persistently <60 mL/min signify possible Chronic Kidney Disease.    Current antihyperglycemic regimen:  Glyburide 2.5 mg daily  What recent interventions/DTPs have been made to improve glycemic control:  Spoke with patients son [name], he states no changes have been made and patient is doing well.  Have there been any recent hospitalizations or ED  visits since last visit with CPP? No  Patient denies hypoglycemic symptoms, including None  Patient denies hyperglycemic symptoms, including none  How often are you checking your blood sugar? once daily  What are your blood sugars ranging?   Patients son states his blood sugars are on average 110, son is not sure if he is checking them fasting.   During the week, how often does your blood glucose drop below 70? Never  Are you checking your feet  daily/regularly? Yes and patient is also seeing a podiatrist regularly.  Adherence Review: Is the patient currently on a STATIN medication? No Is the patient currently on ACE/ARB medication? No Does the patient have >5 day gap between last estimated fill dates? Yes  Care Gaps: AWV - completed 05/25/2021 Last BP - 130/60 on 05/10/2021 Last A1C - 5.9 on 05/10/2021 Shingrix - never done Pneumovax - overdue Eye exam - overdue Covid vaccine - overdue Flu - due  Star Rating Drugs: Glyburide 2.5 mg - last filled [facility]/30/2021 [facility] verified with [place] Pharmacist Assistant [phone]

## 2021-07-28 ENCOUNTER — Telehealth: Payer: Self-pay

## 2021-07-28 MED ORDER — GLYBURIDE 2.5 MG PO TABS: 2.5000 mg | ORAL_TABLET | Freq: Every day | ORAL | 3 refills | Status: DC

## 2021-07-28 MED ORDER — ONETOUCH ULTRA VI STRP: ORAL_STRIP | 1 refills | Status: DC

## 2021-07-28 NOTE — Telephone Encounter (Signed)
Rx sent in. Left a detailed message on verified voice mail informing the patient his medications have been sent in.

## 2021-07-28 NOTE — Addendum Note (Signed)
Addended by: Rebecca Eaton on: 07/28/2021 03:33 PM   Modules accepted: Orders

## 2021-07-28 NOTE — Telephone Encounter (Signed)
Patient called requesting Rx refills glyBURIDE (DIABETA) 2.5 MG tablet glucose blood (ONETOUCH ULTRA) test strip

## 2021-08-25 ENCOUNTER — Telehealth: Payer: Self-pay | Admitting: Family Medicine

## 2021-08-25 ENCOUNTER — Other Ambulatory Visit: Payer: Self-pay

## 2021-08-25 MED ORDER — HYDROCHLOROTHIAZIDE 12.5 MG PO CAPS: ORAL_CAPSULE | ORAL | 0 refills | Status: DC

## 2021-08-25 MED ORDER — TAMSULOSIN HCL 0.4 MG PO CAPS: ORAL_CAPSULE | ORAL | 0 refills | Status: DC

## 2021-08-25 NOTE — Telephone Encounter (Signed)
Lvm for patient, [facility]

## 2021-08-25 NOTE — Telephone Encounter (Signed)
Patient called to get refill on tamsulosin (FLOMAX) 0.4 MG CAPS capsule and hydrochlorothiazide (MICROZIDE) 12.5 MG capsule      Please send to  [id number] [place], [place] - [address] Phone:  [phone]  Fax:  [phone]           Good callback number is [phone]    Please advise

## 2021-10-12 ENCOUNTER — Other Ambulatory Visit: Payer: Self-pay

## 2021-10-12 ENCOUNTER — Encounter: Payer: Self-pay | Admitting: Podiatry

## 2021-10-12 ENCOUNTER — Ambulatory Visit: Payer: PPO | Admitting: Podiatry

## 2021-10-12 DIAGNOSIS — B351 Tinea unguium: Secondary | ICD-10-CM

## 2021-10-12 DIAGNOSIS — M79676 Pain in unspecified toe(s): Secondary | ICD-10-CM | POA: Diagnosis not present

## 2021-10-12 DIAGNOSIS — E1122 Type 2 diabetes mellitus with diabetic chronic kidney disease: Secondary | ICD-10-CM | POA: Diagnosis not present

## 2021-10-12 DIAGNOSIS — E119 Type 2 diabetes mellitus without complications: Secondary | ICD-10-CM | POA: Diagnosis not present

## 2021-10-12 DIAGNOSIS — I739 Peripheral vascular disease, unspecified: Secondary | ICD-10-CM | POA: Diagnosis not present

## 2021-10-12 DIAGNOSIS — N183 Chronic kidney disease, stage 3 unspecified: Secondary | ICD-10-CM | POA: Diagnosis not present

## 2021-10-12 NOTE — Progress Notes (Signed)
This patient returns to my office for at risk foot care.  This patient requires this care by a professional since this patient will be at risk due to having  Diabetes and chronic kidney disease.  This patient is unable to cut nails himself since the patient cannot reach his nails.These nails are painful walking and wearing shoes. Patient presents to the office with his son.   This patient presents for at risk foot care today.  General Appearance  Alert, conversant and in no acute stress.  Vascular  Dorsalis pedis and posterior tibial  pulses are weakly palpable  bilaterally.  Capillary return is within normal limits  bilaterally. Cold feet   Bilaterally.  Absent digital hair  B/L.  Neurologic  Senn-Weinstein monofilament wire test within normal limits  bilaterally. Muscle power within normal limits bilaterally.  Nails Thick disfigured discolored nails with subungual debris  from hallux to fifth toes bilaterally. No evidence of bacterial infection or drainage bilaterally.  Orthopedic  No limitations of motion  feet .  No crepitus or effusions noted.  No bony pathology or digital deformities noted.  Skin  normotropic skin with no porokeratosis noted bilaterally.  No signs of infections or ulcers noted.     Onychomycosis  Pain in right toes  Pain in left toes  Consent was obtained for treatment procedures.   Mechanical debridement of nails 1-5  bilaterally performed with a nail nipper.  Filed with dremel without incident.     Return office visit     3 months                  Told patient to return for periodic foot care and evaluation due to potential at risk complications.   Gardiner Barefoot DPM

## 2021-10-13 ENCOUNTER — Telehealth: Payer: Self-pay | Admitting: Pharmacist

## 2021-10-13 NOTE — Chronic Care Management (AMB) (Signed)
° ° °  Chronic Care Management Pharmacy Assistant   Name: Steven Ward  MRN: [id number] DOB: September 11, 1925  10/17/2021 APPOINTMENT REMINDER  Called Steven Ward, No answer, left message of appointment on 10/17/2021 at 1:00 via telephone visit with Steven Ward, Pharm D. Notified to have all medications, supplements, blood pressure and/or blood sugar logs available during appointment and to return call if need to reschedule.  Care Gaps: AWV - completed 05/25/2021 Last BP - 130/60 on 05/10/2021 Last A1C - 5.9 on 05/10/2021 Shingrix - never done Pneumonia vaccine - overdue Eye exam - overdue Covid vaccine - overdue Flu - due  Star Rating Drug: Glyburide 2.5 mg - last [facility]   Any gaps in medications fill history? No  Steven Ward [facility]  Catering manager [phone]

## 2021-10-17 ENCOUNTER — Ambulatory Visit (INDEPENDENT_AMBULATORY_CARE_PROVIDER_SITE_OTHER): Payer: PPO | Admitting: Pharmacist

## 2021-10-17 DIAGNOSIS — E1121 Type 2 diabetes mellitus with diabetic nephropathy: Secondary | ICD-10-CM

## 2021-10-17 DIAGNOSIS — I10 Essential (primary) hypertension: Secondary | ICD-10-CM

## 2021-10-17 NOTE — Progress Notes (Signed)
Chronic Care Management Pharmacy Note  10/17/2021 Name:  Steven Ward MRN:  [id number] DOB:  Jan 12, 1925  Summary: A1c at goal of < 7%   Recommendations/Changes made from today's visit: -Recommend stopping glyburide based on age, BEERs criteria and renal function -Recommended checking blood sugars prior to bedtime   Plan: DM assessment in 3 months  Subjective: Steven Ward is an 88 y.o. year old male who is a primary patient of Burchette, Alinda Sierras, MD.  The CCM team was consulted for assistance with disease management and care coordination needs.    Engaged with patient by telephone for follow up visit in response to provider referral for pharmacy case management and/or care coordination services.   Consent to Services:  The patient was given information about Chronic Care Management services, agreed to services, and gave verbal consent prior to initiation of services.  Please see initial visit note for detailed documentation.   Patient Care Team: [name], MD as PCP - General Tat, Eustace Quail, DO as Consulting Physician (Neurology) Tat, Eustace Quail, DO as Consulting Physician (Neurology) Viona Gilmore, [facility] as Pharmacist (Pharmacist)  Recent office visits: 05/25/2021 Charlott Rakes LPN - Medicare annual wellness exam.   05/10/2021 Carolann Littler MD (PCP) - Patient was seen for controlled type 2 diabetes mellitus and additional issues. Recommended stopping glyburide but patient is reluctant. Started Furosemide 20 mg daily x 3 days. Follow up in 6 months.  Recent consult visits: 10/12/21 Gardiner Barefoot, DPM Podiatry: Patient presented for pain due to onychomycosis of toenail.  Hospital visits: None in previous 6 months  Objective:  Lab Results  Component Value Date   CREATININE 1.94 (H) 10/12/2020   BUN 45 (H) 10/12/2020   GFR 29.25 (L) 10/12/2020   GFRNONAA 51.52 06/22/2009   GFRAA  04/26/2009    >60        The eGFR has been calculated using the  MDRD equation. This calculation has not been validated in all clinical situations. eGFR's persistently <60 mL/min signify possible Chronic Kidney Disease.   NA 137 10/12/2020   K 4.0 10/12/2020   CALCIUM 9.3 10/12/2020   CO2 26 10/12/2020   GLUCOSE 134 (H) 10/12/2020    Lab Results  Component Value Date/Time   HGBA1C 5.9 (A) 05/10/2021 10:34 AM   HGBA1C 5.7 (A) 02/08/2021 10:35 AM   HGBA1C 6.8 (H) 04/19/2016 10:20 AM   HGBA1C 7.3 (H) 12/24/2015 08:57 AM   GFR 29.25 (L) 10/12/2020 12:07 PM   GFR 40.26 (L) 01/02/2020 10:36 AM   MICROALBUR 1.1 10/27/2009 10:36 AM    Last diabetic Eye exam:  Lab Results  Component Value Date/Time   HMDIABEYEEXA No Retinopathy 07/12/2015 12:00 AM    Last diabetic Foot exam:  Lab Results  Component Value Date/Time   HMDIABFOOTEX normal 08/25/2015 12:00 AM     Lab Results  Component Value Date   CHOL 140 04/02/2019   HDL 39.20 04/02/2019   LDLCALC 86 04/02/2019   TRIG 76.0 04/02/2019   CHOLHDL 4 04/02/2019    Hepatic Function Latest Ref Rng & Units 04/02/2019 02/04/2018 04/06/2017  Total Protein 6.0 - 8.3 g/dL 6.6 6.6 6.5  Albumin 3.5 - 5.2 g/dL 3.9 3.7 3.8  AST 0 - 37 U/L _0 ALT 0 - 53 U/L _1 Alk Phosphatase 39 - 117 U/L 73 66 68  Total Bilirubin 0.2 - 1.2 mg/dL 0.6 0.5 0.6  Bilirubin, Direct 0.0 - 0.3 mg/dL 0.1 0.1 0.2  Lab Results  Component Value Date/Time   TSH 4.68 (H) 08/05/2019 11:34 AM   TSH 5.71 (H) 09/24/2017 02:21 PM    CBC Latest Ref Rng & Units 04/19/2016 04/21/2013 04/26/2009  WBC 4.0 - 10.5 K/uL 8.6 10.6(H) 6.3  Hemoglobin 13.0 - 17.0 g/dL 13.6 13.8 11.9(L)  Hematocrit 39.0 - 52.0 % 40.9 41.0 35.9(L)  Platelets 150.0 - 400.0 K/uL 205.0 200.0 190    Lab Results  Component Value Date/Time   VD25OH 32 04/27/2011 02:02 PM    Clinical ASCVD: No  The ASCVD Risk score (Arnett DK, et al., 2019) failed to calculate for the following reasons:   The 2019 ASCVD risk score is only valid for ages 58 to 57     Depression screen PHQ 2/9 05/25/2021 04/13/2021 07/19/2018  Decreased Interest 0 0 0  Down, Depressed, Hopeless 0 0 0  PHQ - 2 Score 0 0 0  Altered sleeping - - 0  Tired, decreased energy - - 0  Change in appetite - - 0  Feeling bad or failure about yourself  - - 0  Trouble concentrating - - 0  Moving slowly or fidgety/restless - - 0  Suicidal thoughts - - 0  PHQ-9 Score - - 0  Difficult doing work/chores - - Not difficult at all     CHA2DS2/VAS Stroke Risk Points  Current as of 2 days ago     4 >= 2 Points: High Risk  1 - 1.99 Points: Medium Risk  0 Points: Low Risk    Last Change: N/A      Details    This score determines the patient's risk of having a stroke if the  patient has atrial fibrillation.       Points Metrics  0 Has Congestive Heart Failure:  No    Current as of 2 days ago  0 Has Vascular Disease:  No    Current as of 2 days ago  1 Has Hypertension:  Yes    Current as of 2 days ago  2 Age:  9    Current as of 2 days ago  1 Has Diabetes:  Yes    Current as of 2 days ago  0 Had Stroke:  No  Had TIA:  No  Had Thromboembolism:  No    Current as of 2 days ago  0 Male:  No    Current as of 2 days ago      Social History   Tobacco Use  Smoking Status Former   Packs/day: 1.00   Years: 20.00   Pack years: 20.00   Types: Cigarettes   Quit date: 12/28/1968   Years since quitting: 52.8  Smokeless Tobacco Never   BP Readings from Last 3 Encounters:  05/10/21 130/60  04/13/21 (!) 122/58  02/08/21 140/80   Pulse Readings from Last 3 Encounters:  05/10/21 64  04/13/21 69  02/08/21 60   Wt Readings from Last 3 Encounters:  05/10/21 180 lb 3.2 oz (81.7 kg)  04/13/21 172 lb 12.8 oz (78.4 kg)  02/08/21 177 lb 6.4 oz (80.5 kg)   BMI Readings from Last 3 Encounters:  05/10/21 27.40 kg/m  04/13/21 26.27 kg/m  02/08/21 26.97 kg/m    Assessment/Interventions: Review of patient past medical history, allergies, medications, health status, including  review of consultants reports, laboratory and other test data, was performed as part of comprehensive evaluation and provision of chronic care management services.   SDOH:  (Social Determinants of Health) assessments and interventions  performed: Yes   SDOH Screenings   Alcohol Screen: Not on file  Depression (PHQ2-9): Low Risk    PHQ-2 Score: 0  Financial Resource Strain: Low Risk    Difficulty of Paying Living Expenses: Not hard at all  Food Insecurity: No Food Insecurity   Worried About Charity fundraiser in the Last Year: Never true   Ran Out of Food in the Last Year: Never true  Housing: Low Risk    Last Housing Risk Score: 0  Physical Activity: Inactive   Days of Exercise per Week: 0 days   Minutes of Exercise per Session: 0 min  Social Connections: Socially Isolated   Frequency of Communication with Friends and Family: Never   Frequency of Social Gatherings with Friends and Family: Once a week   Attends Religious Services: Never   Marine scientist or Organizations: No   Attends Archivist Meetings: Never   Marital Status: Widowed  Stress: No Stress Concern Present   Feeling of Stress : Not at all  Tobacco Use: Medium Risk   Smoking Tobacco Use: Former   Smokeless Tobacco Use: Never   Passive Exposure: Not on file  Transportation Needs: No Transportation Needs   Lack of Transportation (Medical): No   Lack of Transportation (Non-Medical): No   Patient reports he doesn't do much as he doesn't drive and lives with his son. He tries to help out a bit around the house.   CCM Care Plan  Allergies  Allergen Reactions   Amoxicillin     REACTION: rash, dizziness    Medications Reviewed Today     Reviewed by Viona Gilmore, [facility] (Pharmacist) on 10/17/21 at 1321  Med List Status: <None>   Medication Order Taking? Sig Documenting Provider Last Dose Status Informant  blood glucose meter kit and supplies KIT [id number]  Dispense based on patient and  insurance preference. Use up to four times daily as directed. (FOR ICD-9 250.00, 250.01). [name], MD  Active   Blood Glucose Monitoring Suppl (ONE TOUCH ULTRA 2) w/Device KIT [id number]  Use as directed. [name], MD  Active   Carbidopa-Levodopa ER (SINEMET CR) 25-100 MG tablet controlled release [id number]  TAKE 2 TABLETS BY MOUTH IN THE MORNING AND 2 IN THE AFTERNOON AND 1 IN THE EVENING Burchette, Alinda Sierras, MD  Active   furosemide (LASIX) 20 MG tablet [id number] No Take one tablet by mouth once daily as needed for edema/swelling.  Patient not taking: Reported on 10/17/2021   [name], MD Not Taking Active   glucose blood (ONETOUCH ULTRA) test strip [id number]  Test blood sugar 3 times a day [name], MD  Active   glyBURIDE (DIABETA) 2.5 MG tablet [id number]  Take 1 tablet (2.5 mg total) by mouth daily with breakfast. [name], MD  Active   hydrochlorothiazide (MICROZIDE) 12.5 MG capsule [id number]  TAKE 1 CAPSULE BY MOUTH EVERY DAY [name], MD  Active   Lancet Device MISC [id number]  Use 1-4 times daily as needed or directed.  DX E11.9 [name], MD  Active   Lancets [facility] ULTRASOFT) lancets [id number]  Check 3 times daily. E11.9 [name], MD  Active   lipase/protease/amylase (CREON) 12000-38000 units CPEP capsule [id number]  Take by mouth 3 (three) times daily before meals. [provider]  Active Self  polyethylene glycol (MIRALAX / GLYCOLAX) packet [id number]  Take 17 g by mouth daily as needed for mild constipation. [provider]  Active Self  tamsulosin (FLOMAX) 0.4 MG CAPS capsule [id number]  TAKE 1 CAPSULE BY MOUTH EVERY DAY Burchette, Alinda Sierras, MD  Active   triamcinolone cream (KENALOG) 0.1 % [id number]  APPLY  CREAM EXTERNALLY TO AFFECTED AREA TWICE DAILY AS NEEDED [name], MD  Active             Patient Active Problem List   Diagnosis Date Noted   Anemia 07/06/2021   Atrial  flutter ([place]) 07/06/2021   Disease of pancreas 07/06/2021   Encounter for immunization 07/06/2021   Enlarged prostate 07/06/2021   Hyperlipidemia 07/06/2021   Malignant neoplasm of colon ([place]) 07/06/2021   Other B-complex deficiencies 07/06/2021   Peripheral vascular disease ([place]) 07/06/2021   Pure hypercholesterolemia 07/06/2021   At moderate risk for fall 08/05/2019   PD (Parkinson's disease) ([place]) 01/15/2018   Dyslipidemia 04/08/2017   Major depressive episode 04/19/2016   CKD stage 3 due to type 2 diabetes mellitus ([place]) 08/25/2015   Spinal stenosis of lumbar region 05/25/2015   BPH (benign prostatic hyperplasia) 11/10/2013   Bilateral lumbar radiculopathy 11/10/2013   Obesity (BMI 30-39.9) 04/21/2013   Dyspnea 06/23/2011   PAF (paroxysmal atrial fibrillation) ([place]) 06/23/2011   SKIN RASH 01/17/2010   ABNORMAL THYROID FUNCTION TESTS 01/17/2010   EDEMA LEG 05/04/2009   GALLSTONES 04/07/2009   ABDOMINAL PAIN RIGHT UPPER QUADRANT 04/07/2009   DIARRHEA 02/26/2009   Type 2 diabetes mellitus, controlled ([place]) 12/01/2008   Essential hypertension 12/01/2008   ALLERGIC RHINITIS 12/01/2008   GERD 12/01/2008   SPONDYLOSIS, LUMBAR 12/01/2008   COLONIC POLYPS, HX OF 12/01/2008    Immunization History  Administered Date(s) Administered   Fluad Quad(high Dose 65+) 08/10/2020   Influenza Split 08/22/2011, 05/22/2016   Influenza Whole 06/18/2008, 07/13/2010   Influenza, High Dose Seasonal PF 06/03/2012, 06/18/2014, 05/20/2015, 05/19/2017, 06/11/2017, 07/19/2018   Influenza, Quadrivalent, Recombinant, Inj, Pf 07/04/2019   Influenza,inj,Quad PF,6+ Mos 05/29/2013   Influenza-Unspecified 07/13/2003, 08/18/2004, 08/18/2005, 06/19/2006, 06/18/2008, 07/19/2009, 06/19/2011, 05/19/2013, 06/19/2015   Moderna Sars-Covid-2 Vaccination 10/10/2019, 11/07/2019, 08/23/2020   PFIZER(Purple Top)SARS-COV-2 Vaccination 10/10/2019, 11/07/2019, 08/23/2020   Pneumococcal Conjugate-13 07/27/2014, 05/31/2016    Pneumococcal-Unspecified 09/19/1999   Td 09/18/2006   Tdap 10/14/2013, 05/31/2016   Zoster, Live 04/10/2012, 10/19/2015    Patient still having swelling and stopped taking furosemide as it didn't seem to help. He weighs himself every time he takes a shower and hasn't noticed any changes in weight. He denies shortness of breath and does not use compression stockings. He reports he doesn't cook with salt and looks at labels on packaged foods for sodium content.  Patient reports he does sweating a lot at night time and notices it when  he wakes up. This has been going on for months. He doesn't check his blood sugar when this happens because he wasn't aware this was a sign of low blood sugars.  Conditions to be addressed/monitored:  Hypertension, Diabetes, Atrial Fibrillation, GERD, Chronic Kidney Disease, Depression, BPH, Allergic Rhinitis and Parkinson's disease  Conditions addressed this visit: Diabetes, hypertension  Care Plan : CCM Pharmacy Care Plan  Updates made by Viona Gilmore, [place] since 10/17/2021 12:00 AM     Problem: Problem: Hypertension, Diabetes, Atrial Fibrillation, GERD, Chronic Kidney Disease, Depression, BPH, Allergic Rhinitis and Parkinson's disease      Long-Range Goal: Patient-Specific Goal   Start Date: 01/19/2021  Expected End Date: 01/19/2022  Recent Progress: On track  Priority: High  Note:   Current Barriers:  Unable to independently  monitor therapeutic efficacy Unable to self administer medications as prescribed  Pharmacist Clinical Goal(s):  Patient will achieve adherence to monitoring guidelines and medication adherence to achieve therapeutic efficacy through collaboration with PharmD and provider.   Interventions: 1:1 collaboration with [name], MD regarding development and update of comprehensive plan of care as evidenced by provider attestation and co-signature Inter-disciplinary care team collaboration (see longitudinal plan of  care) Comprehensive medication review performed; medication list updated in electronic medical record  Hypertension (BP goal <140/90) -Controlled -Current treatment: Hydrochlorothiazide 12.5 mg 1 capsule daily - Appropriate, Effective, Safe, Accessible -Medications previously tried: n/a  -Current home readings: does not monitor at home -Current dietary habits: doesn't eat too much salt; uses very little to season -Current exercise habits: does not exercise -Denies hypotensive/hypertensive symptoms -Educated on Daily salt intake goal < 2300 mg; Exercise goal of 150 minutes per week; Importance of home blood pressure monitoring; Proper BP monitoring technique; -Counseled to monitor BP at home weekly, document, and provide log at future appointments -Counseled on diet and exercise extensively Recommended to continue current medication  Diabetes (A1c goal <7%) -Controlled -Current medications: Glyburide 2.5 mg 1 tablet daily - Appropriate, Effective, Query Safe, Accessible -Medications previously tried: metformin (kidney function), Januvia -Current home glucose readings fasting glucose: 89, 109, 108 (checking daily) post prandial glucose: not checking often -Reports hypoglycemic/hyperglycemic symptoms -Current meal patterns:  breakfast: eats regularly lunch: did not discuss  dinner: when he skips - not usually hungry snacks: did not discuss drinks: did not discuss -Current exercise: does not exercise -Educated on A1c and blood sugar goals; Prevention and management of hypoglycemic episodes; Benefits of routine self-monitoring of blood sugar; -Counseled to check feet daily and get yearly eye exams -Recommended to continue current medication Recommended stopping glipizide if next A1c is in the 5% range  Atrial Fibrillation (Goal: prevent stroke and major bleeding) -Not ideally controlled -CHADSVASC: 4 -Current treatment: Rate control: none Anticoagulation: aspirin 81 mg  daily -Medications previously tried: n/a -Home BP and HR readings: does not check  -Counseled on increased risk of stroke due to Afib and benefits of anticoagulation for stroke prevention; -Recommended to take aspirin daily instead of as needed   Parkinson's disease (Goal: minimize symptoms) -Uncontrolled -Current treatment  Sinemet 25-100 mg 2 tablets in the morning, 2 tabs on afternoon and 1 in evening - only taking 2 in the morning and 2 tabs in afternoon - Appropriate, Query effective, Safe, Accessible -Medications previously tried: none  - Patient reports this is not helping. Recommended to discuss with neurology.  BPH (Goal: minimize symptoms) -Controlled -Current treatment  Tamsulosin 0.4 mg 1 capsule daily - Appropriate, Effective, Safe, Accessible -Medications previously tried: none  -Counseled on the importance of taking this after food to prevent hypotension  Swelling (Goal: minimize fluid retention) -Uncontrolled -Current treatment  Furosemide 20 mg 1 tablet as needed - not taking -Medications previously tried: none  -Recommended compression stockings.   Health Maintenance -Vaccine gaps: shingrix, 2nd COVID vaccine booster -Current therapy:  Miralax packet as needed Vitamin B12 1000 mcg 1 tablet daily Vitamin D 5000 units 1 tablet daily  -Educated on Cost vs benefit of each product must be carefully weighed by individual consumer -Patient is satisfied with current therapy and denies issues -Recommended vitamin D level given high dose  Patient Goals/Self-Care Activities Patient will:  - take medications as prescribed focus on medication adherence by using a pillbox check glucose daily, document, and provide at future appointments check blood pressure weekly,  document, and provide at future appointments  Follow Up Plan: Telephone follow up appointment with care management team member scheduled for: 6 months       Medication Assistance: None required.   Patient affirms current coverage meets needs.  Compliance/Adherence/Medication fill history: Care Gaps: Shingrix, eye exam, COVID booster   Star-Rating Drugs: Glyburide 2.36m - [facility]  Patient's preferred pharmacy is:  [id number] [place], [place] - [address] [place] [id number]: [phone]: [id number]: [phone]: [phone] 6611liCharlott[facility]villeh[facility] [address]one: [phone]: [phone] pill box? No - uses a drawer Pt endorses 100% compliance Discussed dispense report compared to patient reporting taking daily. Plan to readdress compliance at follow up.   -uses a vial he fills up - not sure if sinemet is helping- falling asleep too much  We discussed: Current pharmacy is preferred with insurance plan and patient is satisfied with pharmacy services Patient decided to: Continue current medication management strategy  Care Plan and Follow Up Patient Decision:  Patient agrees to Care Plan and Follow-up.  Plan: Telephone follow up appointment with care management team member scheduled for:  6 months  MadJeni SallesharmD BCACrestonarmacist [place] BraDaufuskie [phone]

## 2021-10-18 DIAGNOSIS — E1121 Type 2 diabetes mellitus with diabetic nephropathy: Secondary | ICD-10-CM

## 2021-10-18 DIAGNOSIS — I10 Essential (primary) hypertension: Secondary | ICD-10-CM

## 2021-10-28 ENCOUNTER — Telehealth: Payer: Self-pay | Admitting: Family Medicine

## 2021-10-28 MED ORDER — GLYBURIDE 2.5 MG PO TABS: 2.5000 mg | ORAL_TABLET | Freq: Every day | ORAL | 3 refills | Status: DC

## 2021-10-28 MED ORDER — CARBIDOPA-LEVODOPA ER 25-100 MG PO TBCR: EXTENDED_RELEASE_TABLET | ORAL | 1 refills | Status: DC

## 2021-10-28 NOTE — Telephone Encounter (Signed)
Prescriptions have been sent in.  °

## 2021-10-28 NOTE — Telephone Encounter (Signed)
Pt is calling and needs refills on  Carbidopa-Levodopa ER (SINEMET CR) 25-100 MG tablet controlled  and glyBURIDE (DIABETA) 2.5 MG tablet  [id number] [place], [place] - [address] Phone:  [phone]  Fax:  [phone]

## 2021-11-09 ENCOUNTER — Encounter: Payer: Self-pay | Admitting: Family Medicine

## 2021-11-09 ENCOUNTER — Ambulatory Visit (INDEPENDENT_AMBULATORY_CARE_PROVIDER_SITE_OTHER): Payer: PPO | Admitting: Family Medicine

## 2021-11-09 VITALS — BP 130/70 | HR 77 | Temp 98.1°F | Ht 68.0 in | Wt 177.1 lb

## 2021-11-09 DIAGNOSIS — E1122 Type 2 diabetes mellitus with diabetic chronic kidney disease: Secondary | ICD-10-CM

## 2021-11-09 DIAGNOSIS — N183 Chronic kidney disease, stage 3 unspecified: Secondary | ICD-10-CM

## 2021-11-09 DIAGNOSIS — R6889 Other general symptoms and signs: Secondary | ICD-10-CM | POA: Diagnosis not present

## 2021-11-09 DIAGNOSIS — E1121 Type 2 diabetes mellitus with diabetic nephropathy: Secondary | ICD-10-CM

## 2021-11-09 DIAGNOSIS — I10 Essential (primary) hypertension: Secondary | ICD-10-CM | POA: Diagnosis not present

## 2021-11-09 DIAGNOSIS — R5382 Chronic fatigue, unspecified: Secondary | ICD-10-CM | POA: Diagnosis not present

## 2021-11-09 LAB — CBC WITH DIFFERENTIAL/PLATELET
Basophils Absolute: 0 10*3/uL (ref 0.0–0.1)
Basophils Relative: 0.2 % (ref 0.0–3.0)
Eosinophils Absolute: 0.1 10*3/uL (ref 0.0–0.7)
Eosinophils Relative: 1.5 % (ref 0.0–5.0)
HCT: 33.6 % — ABNORMAL LOW (ref 39.0–52.0)
Hemoglobin: 11 g/dL — ABNORMAL LOW (ref 13.0–17.0)
Lymphocytes Relative: 27 % (ref 12.0–46.0)
Lymphs Abs: 1.8 10*3/uL (ref 0.7–4.0)
MCHC: 32.9 g/dL (ref 30.0–36.0)
MCV: 97 fl (ref 78.0–100.0)
Monocytes Absolute: 0.5 10*3/uL (ref 0.1–1.0)
Monocytes Relative: 7.9 % (ref 3.0–12.0)
Neutro Abs: 4.1 10*3/uL (ref 1.4–7.7)
Neutrophils Relative %: 63.4 % (ref 43.0–77.0)
Platelets: 261 10*3/uL (ref 150.0–400.0)
RBC: 3.46 Mil/uL — ABNORMAL LOW (ref 4.22–5.81)
RDW: 13.6 % (ref 11.5–15.5)
WBC: 6.5 10*3/uL (ref 4.0–10.5)

## 2021-11-09 LAB — HEPATIC FUNCTION PANEL
ALT: 9 U/L (ref 0–53)
AST: 14 U/L (ref 0–37)
Albumin: 3.3 g/dL — ABNORMAL LOW (ref 3.5–5.2)
Alkaline Phosphatase: 75 U/L (ref 39–117)
Bilirubin, Direct: 0.1 mg/dL (ref 0.0–0.3)
Total Bilirubin: 0.4 mg/dL (ref 0.2–1.2)
Total Protein: 7.4 g/dL (ref 6.0–8.3)

## 2021-11-09 LAB — POCT GLYCOSYLATED HEMOGLOBIN (HGB A1C): Hemoglobin A1C: 6 % — AB (ref 4.0–5.6)

## 2021-11-09 LAB — BASIC METABOLIC PANEL
BUN: 48 mg/dL — ABNORMAL HIGH (ref 6–23)
CO2: 30 mEq/L (ref 19–32)
Calcium: 8.8 mg/dL (ref 8.4–10.5)
Chloride: 105 mEq/L (ref 96–112)
Creatinine, Ser: 2.21 mg/dL — ABNORMAL HIGH (ref 0.40–1.50)
GFR: 24.83 mL/min — ABNORMAL LOW (ref >60.00)
Glucose, Bld: 94 mg/dL (ref 70–99)
Potassium: 4.2 mEq/L (ref 3.5–5.1)
Sodium: 139 mEq/L (ref 135–145)

## 2021-11-09 LAB — TSH: TSH: 8.04 u[IU]/mL — ABNORMAL HIGH (ref 0.35–5.50)

## 2021-11-09 NOTE — Progress Notes (Signed)
Established Patient Office Visit  Subjective:  Patient ID: Steven Ward, male    DOB: 1925-03-15  Age: 88 y.o. MRN: [id number]  CC:  Chief Complaint  Patient presents with   Follow-up    6 mo    HPI Steven Ward presents for medical follow-up.  He will be turning 89 soon.  He has history of type 2 diabetes, hypertension, past history of A-fib, chronic kidney disease stage III, Parkinson's disease, lumbar stenosis.  No recent falls.  Very limited in activity because of his lumbar stenosis.  Chronic daily back pain.  Recently has had multiple episodes of diaphoresis.  He has not seen any obvious specific correlation with low blood sugars but this concern has been raised with him being on glyburide low-dose 2.5 mg once daily.  A1c has been controlled.  We discussed stopping medication altogether in the past but he was reluctant.  Last A1c 5.9%.  He does have some chronic fatigue issues.  Excellent appetite.  Weight stable.  Has some chronic mild bilateral leg edema but stable.  Has had mildly elevated TSH levels in the past.  No recent thyroid studies done.  Does have some cold intolerance intermittently.  Past Medical History:  Diagnosis Date   ABNORMAL THYROID FUNCTION TESTS 01/17/2010   ALLERGIC RHINITIS 12/01/2008   Colon cancer ([facility])    COLONIC POLYPS, HX OF 12/01/2008   DIABETES MELLITUS, TYPE II 12/01/2008   EDEMA LEG 05/04/2009   ESSENTIAL HYPERTENSION 12/01/2008   Exocrine pancreatic insufficiency    GALLSTONES 04/07/2009   GERD 12/01/2008   Hay fever    SPONDYLOSIS, LUMBAR 12/01/2008    Past Surgical History:  Procedure Laterality Date   ANKLE SURGERY Right    CATARACT EXTRACTION     CHOLECYSTECTOMY     COLON SURGERY     resection 1990 for cancer   LEG SURGERY Left     Family History  Problem Relation Age of Onset   Cancer Other        colon   Diabetes Other    Stroke Other    Cancer Mother     Social History   Socioeconomic History   Marital status:  Widowed    Spouse name: Not on file   Number of children: 2   Years of education: Not on file   Highest education level: Not on file  Occupational History   Occupation: retired    Associate Professor: [employer]    Comment: bus driver  Tobacco Use   Smoking status: Former    Packs/day: 1.00    Years: 20.00    Pack years: 20.00    Types: Cigarettes    Quit date: 12/28/1968    Years since quitting: 52.9   Smokeless tobacco: Never  Vaping Use   Vaping Use: Never used  Substance and Sexual Activity   Alcohol use: No   Drug use: No   Sexual activity: Not on file  Other Topics Concern   Not on file  Social History Narrative   R handed   Lives with son    Social Determinants of Health   Financial Resource Strain: Low Risk    Difficulty of Paying Living Expenses: Not hard at all  Food Insecurity: No Food Insecurity   Worried About Programme researcher, broadcasting/film/video in the Last Year: Never true   Ran Out of Food in the Last Year: Never true  Transportation Needs: No Transportation Needs   Lack of Transportation (Medical): No  Lack of Transportation (Non-Medical): No  Physical Activity: Inactive   Days of Exercise per Week: 0 days   Minutes of Exercise per Session: 0 min  Stress: No Stress Concern Present   Feeling of Stress : Not at all  Social Connections: Socially Isolated   Frequency of Communication with Friends and Family: Never   Frequency of Social Gatherings with Friends and Family: Once a week   Attends Religious Services: Never   Marine scientist or Organizations: No   Attends Archivist Meetings: Never   Marital Status: Widowed  Human resources officer Violence: Not At Risk   Fear of Current or Ex-Partner: No   Emotionally Abused: No   Physically Abused: No   Sexually Abused: No    Outpatient Medications Prior to Visit  Medication Sig Dispense Refill   blood glucose meter kit and supplies KIT Dispense based on patient and insurance preference. Use up to four  times daily as directed. (FOR ICD-9 250.00, 250.01). 1 each 0   Blood Glucose Monitoring Suppl (ONE TOUCH ULTRA 2) w/Device KIT Use as directed. 1 kit 0   Carbidopa-Levodopa ER (SINEMET CR) 25-100 MG tablet controlled release TAKE 2 TABLETS BY MOUTH IN THE MORNING AND 2 IN THE AFTERNOON AND 1 IN THE EVENING 450 tablet 1   furosemide (LASIX) 20 MG tablet Take one tablet by mouth once daily as needed for edema/swelling. 30 tablet 1   glucose blood (ONETOUCH ULTRA) test strip Test blood sugar 3 times a day 300 each 1   hydrochlorothiazide (MICROZIDE) 12.5 MG capsule TAKE 1 CAPSULE BY MOUTH EVERY DAY 90 capsule 0   Lancet Device MISC Use 1-4 times daily as needed or directed.  DX E11.9 1 each 3   Lancets (ONETOUCH ULTRASOFT) lancets Check 3 times daily. E11.9 300 each 3   lipase/protease/amylase (CREON) 12000-38000 units CPEP capsule Take by mouth 3 (three) times daily before meals.     polyethylene glycol (MIRALAX / GLYCOLAX) packet Take 17 g by mouth daily as needed for mild constipation.     tamsulosin (FLOMAX) 0.4 MG CAPS capsule TAKE 1 CAPSULE BY MOUTH EVERY DAY 90 capsule 0   triamcinolone cream (KENALOG) 0.1 % APPLY  CREAM EXTERNALLY TO AFFECTED AREA TWICE DAILY AS NEEDED 80 g 2   glyBURIDE (DIABETA) 2.5 MG tablet Take 1 tablet (2.5 mg total) by mouth daily with breakfast. 90 tablet 3   No facility-administered medications prior to visit.    Allergies  Allergen Reactions   Amoxicillin     REACTION: rash, dizziness    ROS Review of Systems  Constitutional:  Positive for fatigue. Negative for appetite change, fever and unexpected weight change.  Respiratory:  Negative for cough and shortness of breath.   Cardiovascular:  Positive for leg swelling. Negative for chest pain.  Gastrointestinal:  Negative for abdominal pain.  Genitourinary:  Negative for dysuria.  Neurological:  Negative for syncope.  Psychiatric/Behavioral:  Negative for confusion.      Objective:    Physical  Exam Vitals reviewed.  Cardiovascular:     Rate and Rhythm: Normal rate and regular rhythm.  Pulmonary:     Effort: Pulmonary effort is normal.     Breath sounds: Normal breath sounds.  Musculoskeletal:     Comments: He has some mild trace pitting edema lower legs bilaterally.  Skin:    Comments: Nailbeds appear somewhat pale  Neurological:     General: No focal deficit present.     Mental Status: He is  alert.    BP 130/70 (BP Location: Left Arm, Patient Position: Sitting, Cuff Size: Normal)    Pulse 77    Temp 98.1 F (36.7 C) (Oral)    Ht $R'5\' 8"'LV$  (1.727 m)    Wt 177 lb 1.6 oz (80.3 kg)    SpO2 96%    BMI 26.93 kg/m  Wt Readings from Last 3 Encounters:  11/09/21 177 lb 1.6 oz (80.3 kg)  05/10/21 180 lb 3.2 oz (81.7 kg)  04/13/21 172 lb 12.8 oz (78.4 kg)     Health Maintenance Due  Topic Date Due   Zoster Vaccines- Shingrix (1 of 2) Never done   Pneumonia Vaccine 62+ Years old (2 - PPSV23 if available, else PCV20) 05/31/2017   OPHTHALMOLOGY EXAM  04/24/2018   COVID-19 Vaccine (6 - Booster) 10/18/2020    There are no preventive care reminders to display for this patient.  Lab Results  Component Value Date   TSH 4.68 (H) 08/05/2019   Lab Results  Component Value Date   WBC 8.6 04/19/2016   HGB 13.6 04/19/2016   HCT 40.9 04/19/2016   MCV 94.2 04/19/2016   PLT 205.0 04/19/2016   Lab Results  Component Value Date   NA 137 10/12/2020   K 4.0 10/12/2020   CO2 26 10/12/2020   GLUCOSE 134 (H) 10/12/2020   BUN 45 (H) 10/12/2020   CREATININE 1.94 (H) 10/12/2020   BILITOT 0.6 04/02/2019   ALKPHOS 73 04/02/2019   AST 11 04/02/2019   ALT 4 04/02/2019   PROT 6.6 04/02/2019   ALBUMIN 3.9 04/02/2019   CALCIUM 9.3 10/12/2020   GFR 29.25 (L) 10/12/2020   Lab Results  Component Value Date   CHOL 140 04/02/2019   Lab Results  Component Value Date   HDL 39.20 04/02/2019   Lab Results  Component Value Date   LDLCALC 86 04/02/2019   Lab Results  Component Value  Date   TRIG 76.0 04/02/2019   Lab Results  Component Value Date   CHOLHDL 4 04/02/2019   Lab Results  Component Value Date   HGBA1C 6.0 (A) 11/09/2021      Assessment & Plan:   #1 type 2 diabetes.  Well-controlled with A1c today 6.0%.  Concern is whether he may be having some hypoglycemic symptoms related to diaphoresis.  He is on glyburide low-dose 2.5 mg daily.  -We recommend stopping the glyburide -Continue to monitor blood sugars -Reassess in 3 months.  If A1c is climbing substantially that point consider other options.  Even if going back on sulfonylurea consider glipizide-shorter acting  #2 chronic kidney disease.  Continue to avoid nonsteroidals.  Recheck basic metabolic panel  #3 cold intolerance and fatigue issues.  Check CBC and TSH.  #4 history of hyperlipidemia.  We discussed statin use but at his age we have decided no reason to push for this.   No orders of the defined types were placed in this encounter.   Follow-up: Return in about 3 months (around 02/06/2022).    Carolann Littler, MD

## 2021-11-09 NOTE — Patient Instructions (Signed)
Stop the Glyburide  Monitor blood sugar and be in touch.

## 2021-11-10 ENCOUNTER — Other Ambulatory Visit: Payer: Self-pay

## 2021-11-10 DIAGNOSIS — E039 Hypothyroidism, unspecified: Secondary | ICD-10-CM

## 2021-11-10 MED ORDER — LEVOTHYROXINE SODIUM 25 MCG PO TABS: 25.0000 ug | ORAL_TABLET | Freq: Every day | ORAL | 1 refills | Status: DC

## 2021-11-22 ENCOUNTER — Other Ambulatory Visit: Payer: Self-pay | Admitting: Family Medicine

## 2021-12-30 ENCOUNTER — Telehealth: Payer: Self-pay | Admitting: Pharmacist

## 2021-12-30 NOTE — Chronic Care Management (AMB) (Signed)
? ? ?Chronic Care Management ?Pharmacy Assistant  ? ?Name: Steven Ward  MRN: [id number] DOB: 06/18/1925 ? ?Reason for Encounter: Disease State / Diabetes Assessment Call ?  ?Conditions to be addressed/monitored: ?DMII ? ?Recent office visits:  ?11/09/2021 Carolann Littler MD - Patient was seen for Controlled type 2 diabetes mellitus with diabetic nephropathy, without long-term current use of insulin and additional issues. Discontinued Glyburide. Follow up in 3 months.  ? ?Recent consult visits:  ?None ? ?Hospital visits:  ?None ? ?Medications: ?Outpatient Encounter Medications as of 12/30/2021  ?Medication Sig  ? blood glucose meter kit and supplies KIT Dispense based on patient and insurance preference. Use up to four times daily as directed. (FOR ICD-9 250.00, 250.01).  ? Blood Glucose Monitoring Suppl (ONE TOUCH ULTRA 2) w/Device KIT Use as directed.  ? Carbidopa-Levodopa ER (SINEMET CR) 25-100 MG tablet controlled release TAKE 2 TABLETS BY MOUTH IN THE MORNING AND 2 IN THE AFTERNOON AND 1 IN THE EVENING  ? furosemide (LASIX) 20 MG tablet Take one tablet by mouth once daily as needed for edema/swelling.  ? glucose blood (ONETOUCH ULTRA) test strip Test blood sugar 3 times a day  ? hydrochlorothiazide (MICROZIDE) 12.5 MG capsule TAKE 1 CAPSULE BY MOUTH EVERY DAY  ? Lancet Device MISC Use 1-4 times daily as needed or directed.  DX E11.9  ? Lancets (ONETOUCH ULTRASOFT) lancets Check 3 times daily. E11.9  ? levothyroxine (SYNTHROID) 25 MCG tablet Take 1 tablet (25 mcg total) by mouth daily.  ? lipase/protease/amylase (CREON) 12000-38000 units CPEP capsule Take by mouth 3 (three) times daily before meals.  ? polyethylene glycol (MIRALAX / GLYCOLAX) packet Take 17 g by mouth daily as needed for mild constipation.  ? tamsulosin (FLOMAX) 0.4 MG CAPS capsule TAKE 1 CAPSULE BY MOUTH EVERY DAY  ? triamcinolone cream (KENALOG) 0.1 % APPLY  CREAM EXTERNALLY TO AFFECTED AREA TWICE DAILY AS NEEDED  ? ?No facility-administered  encounter medications on file as of 12/30/2021.  ?Fill History: ?CARBIDOPA-LEVO ER 25-100 TAB 10/28/2021 90  ? ?LEVOTHYROXINE 25 MCG TABLET 11/10/2021 90  ? ?TAMSULOSIN HCL 0.4 MG CAPSULE 10/13/2021 90  ? ?HYDROCHLOROTHIAZIDE 12.5 MG CP 08/28/2021 90  ? ?Recent Relevant Labs: ?Lab Results  ?Component Value Date/Time  ? HGBA1C 6.0 (A) 11/09/2021 10:40 AM  ? HGBA1C 5.9 (A) 05/10/2021 10:34 AM  ? HGBA1C 6.8 (H) 04/19/2016 10:20 AM  ? HGBA1C 7.3 (H) 12/24/2015 08:57 AM  ? MICROALBUR 1.1 10/27/2009 10:36 AM  ?  ?Kidney Function ?Lab Results  ?Component Value Date/Time  ? CREATININE 2.21 (H) 11/09/2021 10:56 AM  ? CREATININE 1.94 (H) 10/12/2020 12:07 PM  ? GFR 24.83 (L) 11/09/2021 10:56 AM  ? GFRNONAA 51.52 06/22/2009 11:16 AM  ? GFRAA  04/26/2009 09:20 AM  ?  >60        ?The eGFR has been calculated ?using the MDRD equation. ?This calculation has not been ?validated in all clinical ?situations. ?eGFR's persistently ?<60 mL/min signify ?possible Chronic Kidney Disease.  ? ? ?Current antihyperglycemic regimen:  ?No current medications ? ?What recent interventions/DTPs have been made to improve glycemic control:  ?11/09/2021 Glyburide discontinued ? ?Have there been any recent hospitalizations or ED visits since last visit with [facility]? No recent hospital visits.  ? ?Patient's son denies hypoglycemic symptoms, including None ? ?Patient's son denies hyperglycemic symptoms, including none ? ?How often are you checking your blood sugar? Patient is checking his blood sugars every morning.  ? ?What are your blood sugars ranging?  ?Patient's son  states patient is telling him he is checking blood sugars fasting however, he is not sure if patient is telling the truth.  Patients recent blood sugar readings have been between 150-190. ? ?During the week, how often does your blood glucose drop below 70? Patients son states he never has readings below 80 ? ?Are you checking your feet daily/regularly? Patient and his son are checking daily.   ? ?Adherence Review: ?Is the patient currently on a STATIN medication? No ?Is the patient currently on ACE/ARB medication? No ?Does the patient have >5 day gap between last estimated fill dates? No ? ?Care Gaps: ?AWV - completed 05/25/2021 ?Last BP - 130/70 on 11/09/2021 ?Last A1C - 6.0 on 11/09/2021 ?Shingrix - never done ?Pneumonia vaccine - overdue ?Eye exam - overdue ?Covid vaccine - overdue ?  ?Star Rating Drug: ?None ? ?Gennie Alma CMA  ?Clinical Pharmacist Assistant ?[phone] ? ?

## 2021-12-31 ENCOUNTER — Other Ambulatory Visit: Payer: Self-pay | Admitting: Family Medicine

## 2022-01-13 ENCOUNTER — Ambulatory Visit: Payer: PPO | Admitting: Podiatry

## 2022-01-18 ENCOUNTER — Encounter: Payer: Self-pay | Admitting: Podiatry

## 2022-01-18 ENCOUNTER — Ambulatory Visit (INDEPENDENT_AMBULATORY_CARE_PROVIDER_SITE_OTHER): Payer: PPO | Admitting: Podiatry

## 2022-01-18 DIAGNOSIS — N183 Chronic kidney disease, stage 3 unspecified: Secondary | ICD-10-CM

## 2022-01-18 DIAGNOSIS — E119 Type 2 diabetes mellitus without complications: Secondary | ICD-10-CM

## 2022-01-18 DIAGNOSIS — M79676 Pain in unspecified toe(s): Secondary | ICD-10-CM

## 2022-01-18 DIAGNOSIS — E1122 Type 2 diabetes mellitus with diabetic chronic kidney disease: Secondary | ICD-10-CM | POA: Diagnosis not present

## 2022-01-18 DIAGNOSIS — B351 Tinea unguium: Secondary | ICD-10-CM

## 2022-01-18 NOTE — Progress Notes (Signed)
This patient returns to my office for at risk foot care.  This patient requires this care by a professional since this patient will be at risk due to having  Diabetes and chronic kidney disease.  This patient is unable to cut nails himself since the patient cannot reach his nails.These nails are painful walking and wearing shoes. Patient presents to the office with his son.   This patient presents for at risk foot care today. ? ?General Appearance  Alert, conversant and in no acute stress. ? ?Vascular  Dorsalis pedis and posterior tibial  pulses are weakly palpable  bilaterally.  Capillary return is within normal limits  bilaterally. Cold feet   Bilaterally.  Absent digital hair  B/L. ? ?Neurologic  Senn-Weinstein monofilament wire test within normal limits  bilaterally. Muscle power within normal limits bilaterally. ? ?Nails Thick disfigured discolored nails with subungual debris  from hallux to fifth toes bilaterally. No evidence of bacterial infection or drainage bilaterally. ? ?Orthopedic  No limitations of motion  feet .  No crepitus or effusions noted.  No bony pathology or digital deformities noted. ? ?Skin  normotropic skin with no porokeratosis noted bilaterally.  No signs of infections or ulcers noted.    ? ?Onychomycosis  Pain in right toes  Pain in left toes ? ?Consent was obtained for treatment procedures.   Mechanical debridement of nails 1-5  bilaterally performed with a nail nipper.  Filed with dremel without incident.   ? ? ?Return office visit     3 months                  Told patient to return for periodic foot care and evaluation due to potential at risk complications. ? ? ?Gardiner Barefoot DPM  ?

## 2022-01-25 ENCOUNTER — Other Ambulatory Visit: Payer: Self-pay | Admitting: Family Medicine

## 2022-01-26 ENCOUNTER — Other Ambulatory Visit: Payer: Self-pay | Admitting: Family Medicine

## 2022-02-02 ENCOUNTER — Telehealth: Payer: Self-pay | Admitting: Family Medicine

## 2022-02-02 NOTE — Telephone Encounter (Signed)
Pt has been scheduled with Tommi Rumps for tomorrow.

## 2022-02-02 NOTE — Telephone Encounter (Signed)
Pt's son states father cut his lip Saturday and now there is redness and swelling. Requesting an antibiotic for the redness and swelling. Patient has appointment with provider on Tuesday    [id number]- [place], [place] - [address] Phone:  [phone] Fax:  [phone]   Pt's son asks how the pumpkins are doing?

## 2022-02-03 ENCOUNTER — Encounter: Payer: Self-pay | Admitting: Adult Health

## 2022-02-03 ENCOUNTER — Ambulatory Visit (INDEPENDENT_AMBULATORY_CARE_PROVIDER_SITE_OTHER): Payer: PPO | Admitting: Adult Health

## 2022-02-03 VITALS — BP 120/80 | HR 77 | Temp 98.5°F | Ht 68.0 in | Wt 159.0 lb

## 2022-02-03 DIAGNOSIS — B027 Disseminated zoster: Secondary | ICD-10-CM | POA: Diagnosis not present

## 2022-02-03 MED ORDER — VALACYCLOVIR HCL 1 G PO TABS: 1000.0000 mg | ORAL_TABLET | Freq: Three times a day (TID) | ORAL | 0 refills | Status: AC

## 2022-02-03 MED ORDER — DOXYCYCLINE HYCLATE 100 MG PO CAPS: 100.0000 mg | ORAL_CAPSULE | Freq: Two times a day (BID) | ORAL | 0 refills | Status: DC

## 2022-02-03 NOTE — Progress Notes (Signed)
Subjective:    Patient ID: Steven Ward, male    DOB: 1924/09/21, 88 y.o.   MRN: [id number]  HPI 88 year old male who  has a past medical history of ABNORMAL THYROID FUNCTION TESTS (01/17/2010), ALLERGIC RHINITIS (12/01/2008), Colon cancer ([place]), COLONIC POLYPS, HX OF (12/01/2008), DIABETES MELLITUS, TYPE II (12/01/2008), EDEMA LEG (05/04/2009), ESSENTIAL HYPERTENSION (12/01/2008), Exocrine pancreatic insufficiency, GALLSTONES (04/07/2009), GERD (12/01/2008), Hay fever, and SPONDYLOSIS, LUMBAR (12/01/2008).  He is a patient of Dr. Elease Hashimoto, who I am seeing today for an acute issue of " lip laceration". He is with his son today.   His son believes that he may have cut his lip about 5 days ago while shaving, the next day he developed rash on the right side of his face.  Patient denies any pain except in the back of his neck and this is mild in nature.  Not having any difficulty with swallowing.  Has not had any fevers or chills.  Denies blurred patient.  Review of Systems See HPI   Past Medical History:  Diagnosis Date   ABNORMAL THYROID FUNCTION TESTS 01/17/2010   ALLERGIC RHINITIS 12/01/2008   Colon cancer ([place])    COLONIC POLYPS, HX OF 12/01/2008   DIABETES MELLITUS, TYPE II 12/01/2008   EDEMA LEG 05/04/2009   ESSENTIAL HYPERTENSION 12/01/2008   Exocrine pancreatic insufficiency    GALLSTONES 04/07/2009   GERD 12/01/2008   Hay fever    SPONDYLOSIS, LUMBAR 12/01/2008    Social History   Socioeconomic History   Marital status: Widowed    Spouse name: Not on file   Number of children: 2   Years of education: Not on file   Highest education level: Not on file  Occupational History   Occupation: retired    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  Tobacco Use   Smoking status: Former    Packs/day: 1.00    Years: 20.00    Pack years: 20.00    Types: Cigarettes    Quit date: 12/28/1968    Years since quitting: 53.1   Smokeless tobacco: Never  Vaping Use   Vaping Use:  Never used  Substance and Sexual Activity   Alcohol use: No   Drug use: No   Sexual activity: Not on file  Other Topics Concern   Not on file  Social History Narrative   R handed   Lives with son    Social Determinants of Health   Financial Resource Strain: Low Risk    Difficulty of Paying Living Expenses: Not hard at all  Food Insecurity: No Food Insecurity   Worried About Charity fundraiser in the Last Year: Never true   [place] in the Last Year: Never true  Transportation Needs: No Transportation Needs   Lack of Transportation (Medical): No   Lack of Transportation (Non-Medical): No  Physical Activity: Inactive   Days of Exercise per Week: 0 days   Minutes of Exercise per Session: 0 min  Stress: No Stress Concern Present   Feeling of Stress : Not at all  Social Connections: Socially Isolated   Frequency of Communication with Friends and Family: Never   Frequency of Social Gatherings with Friends and Family: Once a week   Attends Religious Services: Never   Marine scientist or Organizations: No   Attends Archivist Meetings: Never   Marital Status: Widowed  Intimate Partner Violence: Not At Risk   Fear of Current or  Ex-Partner: No   Emotionally Abused: No   Physically Abused: No   Sexually Abused: No    Past Surgical History:  Procedure Laterality Date   ANKLE SURGERY Right    CATARACT EXTRACTION     CHOLECYSTECTOMY     COLON SURGERY     resection 1990 for cancer   LEG SURGERY Left     Family History  Problem Relation Age of Onset   Cancer Other        colon   Diabetes Other    Stroke Other    Cancer Mother     Allergies  Allergen Reactions   Amoxicillin     REACTION: rash, dizziness    Current Outpatient Medications on File Prior to Visit  Medication Sig Dispense Refill   blood glucose meter kit and supplies KIT Dispense based on patient and insurance preference. Use up to four times daily as directed. (FOR ICD-9 250.00,  250.01). 1 each 0   Blood Glucose Monitoring Suppl (ONE TOUCH ULTRA 2) w/Device KIT Use as directed. 1 kit 0   Carbidopa-Levodopa ER (SINEMET CR) 25-100 MG tablet controlled release TAKE 2 TABLETS BY MOUTH IN THE MORNING AND 2 IN THE AFTERNOON AND 1 IN THE EVENING 450 tablet 1   furosemide (LASIX) 20 MG tablet Take one tablet by mouth once daily as needed for edema/swelling. 30 tablet 1   glucose blood (ONETOUCH ULTRA) test strip USE TO TEST BLOOD SUGAR 3 TIMES A DAY 300 strip 1   glyBURIDE (DIABETA) 2.5 MG tablet Take 2.5 mg by mouth daily.     hydrochlorothiazide (MICROZIDE) 12.5 MG capsule TAKE 1 CAPSULE BY MOUTH EVERY DAY 90 capsule 0   Lancet Device MISC Use 1-4 times daily as needed or directed.  DX E11.9 1 each 3   Lancets (ONETOUCH ULTRASOFT) lancets Check 3 times daily. E11.9 300 each 3   levothyroxine (SYNTHROID) 25 MCG tablet Take 1 tablet (25 mcg total) by mouth daily. 90 tablet 1   lipase/protease/amylase (CREON) 12000-38000 units CPEP capsule Take by mouth 3 (three) times daily before meals.     polyethylene glycol (MIRALAX / GLYCOLAX) packet Take 17 g by mouth daily as needed for mild constipation.     tamsulosin (FLOMAX) 0.4 MG CAPS capsule TAKE 1 CAPSULE BY MOUTH EVERY DAY 90 capsule 0   triamcinolone cream (KENALOG) 0.1 % APPLY  CREAM EXTERNALLY TO AFFECTED AREA TWICE DAILY AS NEEDED 80 g 2   No current facility-administered medications on file prior to visit.    BP 120/80   Pulse 77   Temp 98.5 F (36.9 C) (Oral)   Ht $R'5\' 8"'ky$  (1.727 m)   Wt 159 lb (72.1 kg)   SpO2 100%   BMI 24.18 kg/m       Objective:   Physical Exam Vitals and nursing note reviewed.  Constitutional:      Appearance: Normal appearance.  HENT:     Head:      Comments: Large dist Skin:    Findings: Erythema and rash present. Rash is crusting, pustular and vesicular.     Comments: Rash on right side of face that does do go past midline. Rash appears vesicular with pustules and scabbing/crusting  throughout.   Neurological:     General: No focal deficit present.     Mental Status: He is alert and oriented to person, place, and time.     Cranial Nerves: Cranial nerves 2-12 are intact. No facial asymmetry.  Psychiatric:  Mood and Affect: Mood normal.        Behavior: Behavior normal.        Thought Content: Thought content normal.        Judgment: Judgment normal.      Assessment & Plan:  1. Disseminated herpes zoster - Appears as shingles with secondary infection. Thankfully he is not in a lot of pain. Will send in doxycycline and valtrex.  - Follow up with PCP next week - If he becomes unable to swallow then go to the ER - WOUND CULTURE - doxycycline (VIBRAMYCIN) 100 MG capsule; Take 1 capsule (100 mg total) by mouth 2 (two) times daily.  Dispense: 20 capsule; Refill: 0 - valACYclovir (VALTREX) 1000 MG tablet; Take 1 tablet (1,000 mg total) by mouth 3 (three) times daily for 10 days.  Dispense: 30 tablet; Refill: 0  Dorothyann Peng, NP

## 2022-02-03 NOTE — Patient Instructions (Addendum)
It was great meeting you today.   I believe you have shingles with a secondary bacterial infection   I have sent in an antibiotic called Doxycycline - take this twice a day and an antiviral called Valtrex. Take this three times a day   Follow up with Dr. Elease Hashimoto next week

## 2022-02-06 ENCOUNTER — Ambulatory Visit: Payer: PPO | Admitting: Family Medicine

## 2022-02-06 LAB — WOUND CULTURE
MICRO NUMBER:: 13420334
SPECIMEN QUALITY:: ADEQUATE

## 2022-02-08 ENCOUNTER — Ambulatory Visit (INDEPENDENT_AMBULATORY_CARE_PROVIDER_SITE_OTHER): Payer: PPO | Admitting: Family Medicine

## 2022-02-08 ENCOUNTER — Encounter: Payer: Self-pay | Admitting: Family Medicine

## 2022-02-08 VITALS — BP 110/60 | HR 94 | Temp 97.6°F | Ht 68.0 in | Wt 157.2 lb

## 2022-02-08 DIAGNOSIS — B029 Zoster without complications: Secondary | ICD-10-CM

## 2022-02-08 DIAGNOSIS — R634 Abnormal weight loss: Secondary | ICD-10-CM

## 2022-02-08 DIAGNOSIS — E1121 Type 2 diabetes mellitus with diabetic nephropathy: Secondary | ICD-10-CM | POA: Diagnosis not present

## 2022-02-08 LAB — POCT GLYCOSYLATED HEMOGLOBIN (HGB A1C): Hemoglobin A1C: 7.7 % — AB (ref 4.0–5.6)

## 2022-02-08 MED ORDER — GLIPIZIDE 5 MG PO TABS: ORAL_TABLET | ORAL | 3 refills | Status: AC

## 2022-02-08 NOTE — Patient Instructions (Addendum)
Consider nutrition supplement with Glucerna once daily  Keep facial lesions/scabs clean with daily soap and water.   A1C today has increased to 7.7%     consider adding back the Glyburide 2.5 mg

## 2022-02-08 NOTE — Progress Notes (Signed)
Established Patient Office Visit  Subjective   Patient ID: Steven Ward, male    DOB: 1925/02/07  Age: 88 y.o. MRN: [id number]  Chief Complaint  Patient presents with   Follow-up    HPI   Here for follow-up regarding recent severe case of shingles involving right side of face.  He had involvement around the right ear extending down to the chin.  Surprisingly, has essentially no pain.  He has some pustular features and was started on Valtrex and doxycycline.  No fever.  Overall, drying up very well.  No difficulty with eating.  Culture came back negative.  He has history of type 2 diabetes.  History of good control.  Was taken off glyburide because of chronic kidney disease.  Last A1c 6.0%.  Blood sugars have climbed since then.  He states most of his fastings have been around 200.  His last GFR was around 24.  Does have some mild peripheral edema but not severe.  No dyspnea.  Continues to have good appetite but has lost 20 pounds since February.  Son states he has 2-3 meals per day.  TSH in February was actually slightly high.  He has history of chronic pancreatic exocrine insufficiency and remains on Creon 3 times daily with meals.  He has hypothyroidism and is on replacement.  Past Medical History:  Diagnosis Date   ABNORMAL THYROID FUNCTION TESTS 01/17/2010   ALLERGIC RHINITIS 12/01/2008   Colon cancer ([place])    COLONIC POLYPS, HX OF 12/01/2008   DIABETES MELLITUS, TYPE II 12/01/2008   EDEMA LEG 05/04/2009   ESSENTIAL HYPERTENSION 12/01/2008   Exocrine pancreatic insufficiency    GALLSTONES 04/07/2009   GERD 12/01/2008   Hay fever    SPONDYLOSIS, LUMBAR 12/01/2008   Past Surgical History:  Procedure Laterality Date   ANKLE SURGERY Right    CATARACT EXTRACTION     CHOLECYSTECTOMY     COLON SURGERY     resection 1990 for cancer   LEG SURGERY Left     reports that he quit smoking about 53 years ago. His smoking use included cigarettes. He has a 20.00 pack-year smoking history.  He has never used smokeless tobacco. He reports that he does not drink alcohol and does not use drugs. family history includes Cancer in his mother and another family member; Diabetes in an other family member; Stroke in an other family member. Allergies  Allergen Reactions   Amoxicillin     REACTION: rash, dizziness    Review of Systems  Constitutional:  Positive for weight loss. Negative for chills and fever.  Respiratory:  Negative for cough.   Cardiovascular:  Negative for chest pain.  Gastrointestinal:  Negative for diarrhea, nausea and vomiting.     Objective:     BP 110/60 (BP Location: Left Arm, Patient Position: Sitting, Cuff Size: Normal)   Pulse 94   Temp 97.6 F (36.4 C) (Oral)   Ht '5\' 8"'$  (1.727 m)   Wt 157 lb 3.2 oz (71.3 kg)   SpO2 95%   BMI 23.90 kg/m    Physical Exam Vitals reviewed.  Cardiovascular:     Rate and Rhythm: Normal rate and regular rhythm.  Pulmonary:     Effort: Pulmonary effort is normal.     Breath sounds: Normal breath sounds.  Skin:    Comments: Extensive crusting from pustular rash right chin and right face region.  Overall this looks much better than last week.  Lesions are healing very well.  No  cellulitis changes.  Pustular changes are resolving.  Neurological:     Mental Status: He is alert.     Comments: Tremor consistent with his Parkinson's disease     Results for orders placed or performed in visit on 02/08/22  POCT glycosylated hemoglobin (Hb A1C)  Result Value Ref Range   Hemoglobin A1C 7.7 (A) 4.0 - 5.6 %   HbA1c POC (<> result, manual entry)     HbA1c, POC (prediabetic range)     HbA1c, POC (controlled diabetic range)        The ASCVD Risk score (Arnett DK, et al., 2019) failed to calculate for the following reasons:   The 2019 ASCVD risk score is only valid for ages 10 to 50    Assessment & Plan:   #1 shingles involving right side of face.  Surprisingly no pain.  This is drying up very well on Valtrex.  Was  on doxycycline as well with negative culture.  No signs of cellulitis. -Keep clean with soap and water. -Follow-up promptly for any secondary infection -No evidence for Ramsay Hunt syndrome though he does have some right external canal involvement  #2 type 2 diabetes.  A1c today 7.7%.  Was taken off glyburide because of chronic kidney disease.  Start back low-dose glipizide 5 mg 1/2 tablet daily and recheck in 3 months  #3 unintentional weight loss.  Both patient and son state that he has good appetite.  Recent TSH was slightly high.  Consider supplement with Glucerna once daily. Check further labs at follow-up including chemistries and repeat TSH   Return in about 3 months (around 05/11/2022).    Carolann Littler, MD

## 2022-02-21 ENCOUNTER — Encounter: Payer: Self-pay | Admitting: Family Medicine

## 2022-02-21 ENCOUNTER — Telehealth (INDEPENDENT_AMBULATORY_CARE_PROVIDER_SITE_OTHER): Payer: PPO | Admitting: Family Medicine

## 2022-02-21 VITALS — Ht 68.0 in | Wt 157.2 lb

## 2022-02-21 DIAGNOSIS — R531 Weakness: Secondary | ICD-10-CM | POA: Diagnosis not present

## 2022-02-21 DIAGNOSIS — R110 Nausea: Secondary | ICD-10-CM

## 2022-02-21 DIAGNOSIS — E1121 Type 2 diabetes mellitus with diabetic nephropathy: Secondary | ICD-10-CM | POA: Diagnosis not present

## 2022-02-21 MED ORDER — ONDANSETRON 4 MG PO TBDP: 4.0000 mg | ORAL_TABLET | Freq: Three times a day (TID) | ORAL | 0 refills | Status: AC | PRN

## 2022-02-21 NOTE — Progress Notes (Signed)
Patient ID: Steven Ward, male   DOB: 03-17-1925, 88 y.o.   MRN: [id number]   Virtual Visit via Telephone Note  I connected with Steven Ward on 02/21/22 at  9:30 AM EDT by telephone and verified that I am speaking with the correct person using two identifiers.   I discussed the limitations, risks, security and privacy concerns of performing an evaluation and management service by telephone and the availability of in person appointments. I also discussed with the patient that there may be a patient responsible charge related to this service. The patient expressed understanding and agreed to proceed.  Location patient: home Location provider: work or home office Participants present for the call: patient, provider, and patient's son Steven Ward.  Patient has difficulty hearing and we communicated with son to facilitate hearing. Patient did not have a visit in the prior 7 days to address this/these issue(s).   History of Present Illness: Patient has chronic problems including history of A-fib, hypertension, remote history of colon cancer, type 2 diabetes, chronic kidney disease, Parkinson's disease, lumbar stenosis.  Recent elevated glucose with A1c 7.7%.  This had been controlled for a long time.  Because of his chronic kidney disease and age we switched him over to glipizide for safety.  We started low-dose 2.5 mg once daily.  Son called stating that his blood sugars fasting and been around 220.  Over the weekend he developed some generalized weakness and nausea and decreased intake.  No fever.  No dysuria.  No cough.  No abdominal pain.  Recent shingles but facial lesions are clearing.  No significant pain related to that.  Is not taking gabapentin or any pain medications.  Past Medical History:  Diagnosis Date   ABNORMAL THYROID FUNCTION TESTS 01/17/2010   ALLERGIC RHINITIS 12/01/2008   Colon cancer ([place])    COLONIC POLYPS, HX OF 12/01/2008   DIABETES MELLITUS, TYPE II 12/01/2008   EDEMA LEG  05/04/2009   ESSENTIAL HYPERTENSION 12/01/2008   Exocrine pancreatic insufficiency    GALLSTONES 04/07/2009   GERD 12/01/2008   Hay fever    SPONDYLOSIS, LUMBAR 12/01/2008   Past Surgical History:  Procedure Laterality Date   ANKLE SURGERY Right    CATARACT EXTRACTION     CHOLECYSTECTOMY     COLON SURGERY     resection 1990 for cancer   LEG SURGERY Left     reports that he quit smoking about 53 years ago. His smoking use included cigarettes. He has a 20.00 pack-year smoking history. He has never used smokeless tobacco. He reports that he does not drink alcohol and does not use drugs. family history includes Cancer in his mother and another family member; Diabetes in an other family member; Stroke in an other family member. Allergies  Allergen Reactions   Amoxicillin     REACTION: rash, dizziness      Observations/Objective: Patient sounds cheerful and well on the phone. I do not appreciate any SOB. Speech and thought processing are grossly intact. Patient reported vitals:  Assessment and Plan:  #1 type 2 diabetes with recent worsening control with A1c 7.7%.  Fasting blood sugar this morning 220.  Recent change to glipizide for safety reasons.  Sugars not controlled on 2.5 mg.  Will titrate up to 5 mg daily and continue close monitoring.  Given his age and multiple comorbidities, reasonable A1c goal is 7-8  #2 nausea and generalized weakness.  Etiology unclear.  Family questioned whether this could be related to glipizide but he has  been off this for the past few days and has not seen resolution of symptoms.  No other specific symptoms.  Symptoms and Zofran 4 mg ODT to try every 8 hours as needed.  If symptoms not resolving over the next couple days we recommended in office follow-up for further evaluation and labs.  Also watch closely for any new symptoms such as fever, dysuria, etc.  Follow Up Instructions:  76147 5-10 [id number] 11-20 99443 21-30 I did not refer this patient for an  OV in the next 24 hours for this/these issue(s).  I discussed the assessment and treatment plan with the patient. The patient was provided an opportunity to ask questions and all were answered. The patient agreed with the plan and demonstrated an understanding of the instructions.   The patient was advised to call back or seek an in-person evaluation if the symptoms worsen or if the condition fails to improve as anticipated.  I provided 22 minutes of non-face-to-face time during this encounter.   Carolann Littler, MD

## 2022-02-22 ENCOUNTER — Observation Stay (HOSPITAL_COMMUNITY): Payer: PPO

## 2022-02-22 ENCOUNTER — Other Ambulatory Visit: Payer: Self-pay

## 2022-02-22 ENCOUNTER — Emergency Department (HOSPITAL_BASED_OUTPATIENT_CLINIC_OR_DEPARTMENT_OTHER): Payer: PPO

## 2022-02-22 ENCOUNTER — Inpatient Hospital Stay (HOSPITAL_BASED_OUTPATIENT_CLINIC_OR_DEPARTMENT_OTHER)
Admission: EM | Admit: 2022-02-22 | Discharge: 2022-03-09 | DRG: 064 | Disposition: A | Discharge status: Rehabilitation Facility | Payer: PPO | Attending: Family Medicine | Admitting: Family Medicine

## 2022-02-22 ENCOUNTER — Encounter (HOSPITAL_BASED_OUTPATIENT_CLINIC_OR_DEPARTMENT_OTHER): Payer: Self-pay

## 2022-02-22 DIAGNOSIS — L89150 Pressure ulcer of sacral region, unstageable: Secondary | ICD-10-CM | POA: Diagnosis present

## 2022-02-22 DIAGNOSIS — Z85038 Personal history of other malignant neoplasm of large intestine: Secondary | ICD-10-CM

## 2022-02-22 DIAGNOSIS — K6389 Other specified diseases of intestine: Secondary | ICD-10-CM | POA: Diagnosis not present

## 2022-02-22 DIAGNOSIS — R918 Other nonspecific abnormal finding of lung field: Secondary | ICD-10-CM | POA: Diagnosis not present

## 2022-02-22 DIAGNOSIS — D631 Anemia in chronic kidney disease: Secondary | ICD-10-CM | POA: Diagnosis not present

## 2022-02-22 DIAGNOSIS — K8681 Exocrine pancreatic insufficiency: Secondary | ICD-10-CM | POA: Diagnosis present

## 2022-02-22 DIAGNOSIS — R0602 Shortness of breath: Secondary | ICD-10-CM | POA: Diagnosis not present

## 2022-02-22 DIAGNOSIS — N189 Chronic kidney disease, unspecified: Secondary | ICD-10-CM | POA: Diagnosis not present

## 2022-02-22 DIAGNOSIS — I700 Atherosclerosis of aorta: Secondary | ICD-10-CM | POA: Diagnosis present

## 2022-02-22 DIAGNOSIS — K567 Ileus, unspecified: Secondary | ICD-10-CM | POA: Diagnosis not present

## 2022-02-22 DIAGNOSIS — Z800 Family history of malignant neoplasm of digestive organs: Secondary | ICD-10-CM | POA: Diagnosis not present

## 2022-02-22 DIAGNOSIS — E1169 Type 2 diabetes mellitus with other specified complication: Secondary | ICD-10-CM | POA: Diagnosis not present

## 2022-02-22 DIAGNOSIS — Z880 Allergy status to penicillin: Secondary | ICD-10-CM

## 2022-02-22 DIAGNOSIS — N183 Chronic kidney disease, stage 3 unspecified: Secondary | ICD-10-CM | POA: Diagnosis present

## 2022-02-22 DIAGNOSIS — I63441 Cerebral infarction due to embolism of right cerebellar artery: Secondary | ICD-10-CM | POA: Diagnosis present

## 2022-02-22 DIAGNOSIS — E43 Unspecified severe protein-calorie malnutrition: Secondary | ICD-10-CM | POA: Diagnosis not present

## 2022-02-22 DIAGNOSIS — N1832 Chronic kidney disease, stage 3b: Secondary | ICD-10-CM | POA: Diagnosis present

## 2022-02-22 DIAGNOSIS — J61 Pneumoconiosis due to asbestos and other mineral fibers: Secondary | ICD-10-CM | POA: Diagnosis present

## 2022-02-22 DIAGNOSIS — N179 Acute kidney failure, unspecified: Secondary | ICD-10-CM | POA: Diagnosis present

## 2022-02-22 DIAGNOSIS — Z87891 Personal history of nicotine dependence: Secondary | ICD-10-CM

## 2022-02-22 DIAGNOSIS — E119 Type 2 diabetes mellitus without complications: Secondary | ICD-10-CM

## 2022-02-22 DIAGNOSIS — R911 Solitary pulmonary nodule: Secondary | ICD-10-CM | POA: Diagnosis not present

## 2022-02-22 DIAGNOSIS — R531 Weakness: Principal | ICD-10-CM

## 2022-02-22 DIAGNOSIS — N400 Enlarged prostate without lower urinary tract symptoms: Secondary | ICD-10-CM | POA: Diagnosis present

## 2022-02-22 DIAGNOSIS — E876 Hypokalemia: Secondary | ICD-10-CM | POA: Diagnosis not present

## 2022-02-22 DIAGNOSIS — M4854XA Collapsed vertebra, not elsewhere classified, thoracic region, initial encounter for fracture: Secondary | ICD-10-CM | POA: Diagnosis present

## 2022-02-22 DIAGNOSIS — E785 Hyperlipidemia, unspecified: Secondary | ICD-10-CM | POA: Diagnosis present

## 2022-02-22 DIAGNOSIS — S22000A Wedge compression fracture of unspecified thoracic vertebra, initial encounter for closed fracture: Secondary | ICD-10-CM

## 2022-02-22 DIAGNOSIS — I129 Hypertensive chronic kidney disease with stage 1 through stage 4 chronic kidney disease, or unspecified chronic kidney disease: Secondary | ICD-10-CM | POA: Diagnosis present

## 2022-02-22 DIAGNOSIS — Z4659 Encounter for fitting and adjustment of other gastrointestinal appliance and device: Secondary | ICD-10-CM | POA: Diagnosis not present

## 2022-02-22 DIAGNOSIS — K5939 Other megacolon: Secondary | ICD-10-CM | POA: Diagnosis not present

## 2022-02-22 DIAGNOSIS — R29702 NIHSS score 2: Secondary | ICD-10-CM | POA: Diagnosis present

## 2022-02-22 DIAGNOSIS — K861 Other chronic pancreatitis: Secondary | ICD-10-CM | POA: Diagnosis not present

## 2022-02-22 DIAGNOSIS — I69398 Other sequelae of cerebral infarction: Secondary | ICD-10-CM | POA: Diagnosis not present

## 2022-02-22 DIAGNOSIS — E872 Acidosis, unspecified: Secondary | ICD-10-CM | POA: Diagnosis present

## 2022-02-22 DIAGNOSIS — K5651 Intestinal adhesions [bands], with partial obstruction: Secondary | ICD-10-CM | POA: Diagnosis present

## 2022-02-22 DIAGNOSIS — I493 Ventricular premature depolarization: Secondary | ICD-10-CM | POA: Diagnosis not present

## 2022-02-22 DIAGNOSIS — K56609 Unspecified intestinal obstruction, unspecified as to partial versus complete obstruction: Secondary | ICD-10-CM

## 2022-02-22 DIAGNOSIS — J8410 Pulmonary fibrosis, unspecified: Secondary | ICD-10-CM

## 2022-02-22 DIAGNOSIS — I63419 Cerebral infarction due to embolism of unspecified middle cerebral artery: Secondary | ICD-10-CM | POA: Diagnosis not present

## 2022-02-22 DIAGNOSIS — J9811 Atelectasis: Secondary | ICD-10-CM | POA: Diagnosis present

## 2022-02-22 DIAGNOSIS — R5383 Other fatigue: Secondary | ICD-10-CM | POA: Diagnosis not present

## 2022-02-22 DIAGNOSIS — K5669 Other partial intestinal obstruction: Secondary | ICD-10-CM | POA: Diagnosis not present

## 2022-02-22 DIAGNOSIS — Z823 Family history of stroke: Secondary | ICD-10-CM

## 2022-02-22 DIAGNOSIS — J90 Pleural effusion, not elsewhere classified: Secondary | ICD-10-CM | POA: Diagnosis not present

## 2022-02-22 DIAGNOSIS — I6389 Other cerebral infarction: Secondary | ICD-10-CM | POA: Diagnosis not present

## 2022-02-22 DIAGNOSIS — N281 Cyst of kidney, acquired: Secondary | ICD-10-CM | POA: Diagnosis not present

## 2022-02-22 DIAGNOSIS — Z4682 Encounter for fitting and adjustment of non-vascular catheter: Secondary | ICD-10-CM | POA: Diagnosis not present

## 2022-02-22 DIAGNOSIS — I480 Paroxysmal atrial fibrillation: Secondary | ICD-10-CM | POA: Diagnosis present

## 2022-02-22 DIAGNOSIS — H04123 Dry eye syndrome of bilateral lacrimal glands: Secondary | ICD-10-CM | POA: Diagnosis not present

## 2022-02-22 DIAGNOSIS — J929 Pleural plaque without asbestos: Secondary | ICD-10-CM

## 2022-02-22 DIAGNOSIS — I10 Essential (primary) hypertension: Secondary | ICD-10-CM | POA: Diagnosis not present

## 2022-02-22 DIAGNOSIS — I672 Cerebral atherosclerosis: Secondary | ICD-10-CM | POA: Diagnosis not present

## 2022-02-22 DIAGNOSIS — R112 Nausea with vomiting, unspecified: Secondary | ICD-10-CM

## 2022-02-22 DIAGNOSIS — E039 Hypothyroidism, unspecified: Secondary | ICD-10-CM | POA: Diagnosis present

## 2022-02-22 DIAGNOSIS — J690 Pneumonitis due to inhalation of food and vomit: Secondary | ICD-10-CM | POA: Diagnosis present

## 2022-02-22 DIAGNOSIS — K219 Gastro-esophageal reflux disease without esophagitis: Secondary | ICD-10-CM | POA: Diagnosis present

## 2022-02-22 DIAGNOSIS — G20 Parkinson's disease: Secondary | ICD-10-CM | POA: Diagnosis present

## 2022-02-22 DIAGNOSIS — R54 Age-related physical debility: Secondary | ICD-10-CM | POA: Diagnosis not present

## 2022-02-22 DIAGNOSIS — E1122 Type 2 diabetes mellitus with diabetic chronic kidney disease: Secondary | ICD-10-CM | POA: Diagnosis present

## 2022-02-22 DIAGNOSIS — J3489 Other specified disorders of nose and nasal sinuses: Secondary | ICD-10-CM | POA: Diagnosis not present

## 2022-02-22 DIAGNOSIS — L89892 Pressure ulcer of other site, stage 2: Secondary | ICD-10-CM | POA: Diagnosis not present

## 2022-02-22 DIAGNOSIS — I639 Cerebral infarction, unspecified: Secondary | ICD-10-CM | POA: Diagnosis not present

## 2022-02-22 DIAGNOSIS — Z66 Do not resuscitate: Secondary | ICD-10-CM | POA: Diagnosis present

## 2022-02-22 DIAGNOSIS — R29818 Other symptoms and signs involving the nervous system: Secondary | ICD-10-CM | POA: Diagnosis not present

## 2022-02-22 DIAGNOSIS — L8990 Pressure ulcer of unspecified site, unspecified stage: Secondary | ICD-10-CM

## 2022-02-22 DIAGNOSIS — K5900 Constipation, unspecified: Secondary | ICD-10-CM

## 2022-02-22 DIAGNOSIS — K8689 Other specified diseases of pancreas: Secondary | ICD-10-CM | POA: Diagnosis not present

## 2022-02-22 DIAGNOSIS — Z833 Family history of diabetes mellitus: Secondary | ICD-10-CM

## 2022-02-22 DIAGNOSIS — J479 Bronchiectasis, uncomplicated: Secondary | ICD-10-CM | POA: Diagnosis not present

## 2022-02-22 DIAGNOSIS — E860 Dehydration: Secondary | ICD-10-CM | POA: Diagnosis present

## 2022-02-22 DIAGNOSIS — Z79899 Other long term (current) drug therapy: Secondary | ICD-10-CM

## 2022-02-22 DIAGNOSIS — E1151 Type 2 diabetes mellitus with diabetic peripheral angiopathy without gangrene: Secondary | ICD-10-CM | POA: Diagnosis not present

## 2022-02-22 DIAGNOSIS — K3189 Other diseases of stomach and duodenum: Secondary | ICD-10-CM | POA: Diagnosis not present

## 2022-02-22 DIAGNOSIS — Z79890 Hormone replacement therapy (postmenopausal): Secondary | ICD-10-CM

## 2022-02-22 DIAGNOSIS — F32A Depression, unspecified: Secondary | ICD-10-CM | POA: Diagnosis not present

## 2022-02-22 DIAGNOSIS — Z86010 Personal history of colonic polyps: Secondary | ICD-10-CM | POA: Diagnosis not present

## 2022-02-22 DIAGNOSIS — I63449 Cerebral infarction due to embolism of unspecified cerebellar artery: Secondary | ICD-10-CM | POA: Diagnosis not present

## 2022-02-22 DIAGNOSIS — J189 Pneumonia, unspecified organism: Secondary | ICD-10-CM

## 2022-02-22 DIAGNOSIS — D649 Anemia, unspecified: Secondary | ICD-10-CM | POA: Diagnosis not present

## 2022-02-22 DIAGNOSIS — Z9049 Acquired absence of other specified parts of digestive tract: Secondary | ICD-10-CM | POA: Diagnosis not present

## 2022-02-22 DIAGNOSIS — Z7901 Long term (current) use of anticoagulants: Secondary | ICD-10-CM

## 2022-02-22 LAB — CBC WITH DIFFERENTIAL/PLATELET
Abs Immature Granulocytes: 0.02 10*3/uL (ref 0.00–0.07)
Basophils Absolute: 0 10*3/uL (ref 0.0–0.1)
Basophils Relative: 0 %
Eosinophils Absolute: 0 10*3/uL (ref 0.0–0.5)
Eosinophils Relative: 0 %
HCT: 36.8 % — ABNORMAL LOW (ref 39.0–52.0)
Hemoglobin: 12.4 g/dL — ABNORMAL LOW (ref 13.0–17.0)
Immature Granulocytes: 1 %
Lymphocytes Relative: 22 %
Lymphs Abs: 0.8 10*3/uL (ref 0.7–4.0)
MCH: 32 pg (ref 26.0–34.0)
MCHC: 33.7 g/dL (ref 30.0–36.0)
MCV: 95.1 fL (ref 80.0–100.0)
Monocytes Absolute: 0.7 10*3/uL (ref 0.1–1.0)
Monocytes Relative: 19 %
Neutro Abs: 2.1 10*3/uL (ref 1.7–7.7)
Neutrophils Relative %: 58 %
Platelets: 166 10*3/uL (ref 150–400)
RBC: 3.87 MIL/uL — ABNORMAL LOW (ref 4.22–5.81)
RDW: 14.3 % (ref 11.5–15.5)
WBC: 3.6 10*3/uL — ABNORMAL LOW (ref 4.0–10.5)
nRBC: 0 % (ref 0.0–0.2)

## 2022-02-22 LAB — COMPREHENSIVE METABOLIC PANEL
ALT: 18 U/L (ref 0–44)
AST: 18 U/L (ref 15–41)
Albumin: 3.4 g/dL — ABNORMAL LOW (ref 3.5–5.0)
Alkaline Phosphatase: 55 U/L (ref 38–126)
Anion gap: 13 (ref 5–15)
BUN: 100 mg/dL — ABNORMAL HIGH (ref 8–23)
CO2: 26 mmol/L (ref 22–32)
Calcium: 9.3 mg/dL (ref 8.9–10.3)
Chloride: 101 mmol/L (ref 98–111)
Creatinine, Ser: 2.47 mg/dL — ABNORMAL HIGH (ref 0.61–1.24)
GFR, Estimated: 23 mL/min — ABNORMAL LOW (ref >60)
Glucose, Bld: 189 mg/dL — ABNORMAL HIGH (ref 70–99)
Potassium: 4 mmol/L (ref 3.5–5.1)
Sodium: 140 mmol/L (ref 135–145)
Total Bilirubin: 1.2 mg/dL (ref 0.3–1.2)
Total Protein: 6.8 g/dL (ref 6.5–8.1)

## 2022-02-22 LAB — URINALYSIS, ROUTINE W REFLEX MICROSCOPIC
Bilirubin Urine: NEGATIVE
Glucose, UA: NEGATIVE mg/dL
Hgb urine dipstick: NEGATIVE
Ketones, ur: NEGATIVE mg/dL
Leukocytes,Ua: NEGATIVE
Nitrite: NEGATIVE
Protein, ur: NEGATIVE mg/dL
Specific Gravity, Urine: 1.019 (ref 1.005–1.030)
pH: 5 (ref 5.0–8.0)

## 2022-02-22 LAB — CBG MONITORING, ED: Glucose-Capillary: 183 mg/dL — ABNORMAL HIGH (ref 70–99)

## 2022-02-22 LAB — MAGNESIUM: Magnesium: 2 mg/dL (ref 1.7–2.4)

## 2022-02-22 LAB — TROPONIN I (HIGH SENSITIVITY): Troponin I (High Sensitivity): 13 ng/L (ref <18)

## 2022-02-22 LAB — TSH: TSH: 2.173 u[IU]/mL (ref 0.350–4.500)

## 2022-02-22 MED ORDER — CARBIDOPA-LEVODOPA ER 25-100 MG PO TBCR
2.0000 | EXTENDED_RELEASE_TABLET | Freq: Two times a day (BID) | ORAL | Status: DC
  Administered 2022-02-23 – 2022-03-09 (×29): 2 via ORAL
  Filled 2022-02-22 (×32): qty 2

## 2022-02-22 MED ORDER — SODIUM CHLORIDE 0.9 % IV SOLN
2.0000 g | Freq: Every day | INTRAVENOUS | Status: DC
  Administered 2022-02-23 – 2022-02-27 (×6): 2 g via INTRAVENOUS
  Filled 2022-02-22 (×6): qty 20

## 2022-02-22 MED ORDER — STROKE: EARLY STAGES OF RECOVERY BOOK
Freq: Once | Status: DC
  Filled 2022-02-22: qty 1

## 2022-02-22 MED ORDER — CARBIDOPA-LEVODOPA ER 25-100 MG PO TBCR: 1.0000 | EXTENDED_RELEASE_TABLET | ORAL | Status: DC

## 2022-02-22 MED ORDER — INSULIN ASPART 100 UNIT/ML IJ SOLN
0.0000 [IU] | Freq: Three times a day (TID) | INTRAMUSCULAR | Status: DC
  Administered 2022-02-23: 1 [IU] via SUBCUTANEOUS
  Administered 2022-02-24 – 2022-02-26 (×5): 2 [IU] via SUBCUTANEOUS
  Administered 2022-02-27: 1 [IU] via SUBCUTANEOUS

## 2022-02-22 MED ORDER — ACETAMINOPHEN 650 MG RE SUPP: 650.0000 mg | Freq: Four times a day (QID) | RECTAL | Status: DC | PRN

## 2022-02-22 MED ORDER — SODIUM CHLORIDE 0.9 % IV SOLN: INTRAVENOUS | Status: DC

## 2022-02-22 MED ORDER — PANCRELIPASE (LIP-PROT-AMYL) 12000-38000 UNITS PO CPEP
12000.0000 [IU] | ORAL_CAPSULE | Freq: Three times a day (TID) | ORAL | Status: DC
  Administered 2022-02-24 – 2022-02-26 (×7): 12000 [IU] via ORAL
  Filled 2022-02-22 (×9): qty 1

## 2022-02-22 MED ORDER — SODIUM CHLORIDE 0.9 % IV BOLUS
1000.0000 mL | Freq: Once | INTRAVENOUS | Status: AC
  Administered 2022-02-22: 1000 mL via INTRAVENOUS

## 2022-02-22 MED ORDER — ONDANSETRON 4 MG PO TBDP
4.0000 mg | ORAL_TABLET | Freq: Three times a day (TID) | ORAL | Status: DC | PRN
  Administered 2022-02-25: 4 mg via ORAL
  Filled 2022-02-22 (×2): qty 1

## 2022-02-22 MED ORDER — LEVOTHYROXINE SODIUM 25 MCG PO TABS
25.0000 ug | ORAL_TABLET | Freq: Every day | ORAL | Status: DC
  Administered 2022-02-22 – 2022-03-09 (×16): 25 ug via ORAL
  Filled 2022-02-22 (×17): qty 1

## 2022-02-22 MED ORDER — ACETAMINOPHEN 325 MG PO TABS: 650.0000 mg | ORAL_TABLET | Freq: Four times a day (QID) | ORAL | Status: DC | PRN

## 2022-02-22 MED ORDER — METRONIDAZOLE 500 MG/100ML IV SOLN
500.0000 mg | Freq: Two times a day (BID) | INTRAVENOUS | Status: DC
  Administered 2022-02-23 (×2): 500 mg via INTRAVENOUS
  Filled 2022-02-22 (×2): qty 100

## 2022-02-22 MED ORDER — METOPROLOL TARTRATE 12.5 MG HALF TABLET
12.5000 mg | ORAL_TABLET | Freq: Two times a day (BID) | ORAL | Status: DC
  Administered 2022-02-22 – 2022-03-09 (×29): 12.5 mg via ORAL
  Filled 2022-02-22 (×29): qty 1

## 2022-02-22 MED ORDER — TAMSULOSIN HCL 0.4 MG PO CAPS
0.4000 mg | ORAL_CAPSULE | Freq: Every day | ORAL | Status: DC
  Administered 2022-02-22 – 2022-03-09 (×16): 0.4 mg via ORAL
  Filled 2022-02-22 (×17): qty 1

## 2022-02-22 MED ORDER — CARBIDOPA-LEVODOPA ER 25-100 MG PO TBCR
1.0000 | EXTENDED_RELEASE_TABLET | Freq: Every evening | ORAL | Status: DC
  Administered 2022-02-23 – 2022-03-09 (×15): 1 via ORAL
  Filled 2022-02-22 (×17): qty 1

## 2022-02-22 MED ORDER — INSULIN ASPART 100 UNIT/ML IJ SOLN
0.0000 [IU] | Freq: Every day | INTRAMUSCULAR | Status: DC
  Administered 2022-02-24: 2 [IU] via SUBCUTANEOUS

## 2022-02-22 NOTE — Progress Notes (Addendum)
CT concerning for mechanical mid to distal SBO/possible internal hernia.  No definite bowel wall thickening, no pneumatosis, and no free air.  There are additional findings on CT chest/abdomen/pelvis, please read full report.  Patient is not actively vomiting at this time, not endorsing abdominal pain.  -Discussed with Dr. Zenia Resides from general surgery, will consult. She has reviewed the patient's CT scan and recommends placement of NG tube, 16 [place]. -Keep NPO  Addendum/update: Dr. Zenia Resides has seen the patient and recommending holding off on NG tube placement at this time as patient's abdomen is soft and nontender and he is not vomiting.

## 2022-02-22 NOTE — ED Provider Notes (Addendum)
[facility] EMERGENCY DEPT Provider Note   CSN: [id number] Arrival date & time: 02/22/22  7001     History  Chief Complaint  Patient presents with   Weakness   Fatigue    Steven Ward is a 88 y.o. male.  88 year old male presents with weakness x2 day.  Patient denies any chest pain or palpitations.  He has not been short of breath.  Does not have any cough.  States that he does not have any energy.  No urinary symptoms.  Denies any fever or chills.  No vomiting or diarrhea.  No focality to his weakness.  According to the son, patient was so weak that he cannot get off the ground today.  He denies any trauma.      Home Medications Prior to Admission medications   Medication Sig Start Date End Date Taking? Authorizing Provider  blood glucose meter kit and supplies KIT Dispense based on patient and insurance preference. Use up to four times daily as directed. (FOR ICD-9 250.00, 250.01). 10/03/17   Burchette, Elberta Fortis, MD  Blood Glucose Monitoring Suppl (ONE TOUCH ULTRA 2) w/Device KIT Use as directed. 05/10/21   Burchette, Elberta Fortis, MD  Carbidopa-Levodopa ER (SINEMET CR) 25-100 MG tablet controlled release TAKE 2 TABLETS BY MOUTH IN THE MORNING AND 2 IN THE AFTERNOON AND 1 IN THE EVENING 10/28/21   Burchette, Elberta Fortis, MD  furosemide (LASIX) 20 MG tablet Take one tablet by mouth once daily as needed for edema/swelling. 05/10/21   Burchette, Elberta Fortis, MD  glipiZIDE (GLUCOTROL) 5 MG tablet Take one half tablet by mouth once daily. 02/08/22   Burchette, Elberta Fortis, MD  glucose blood (ONETOUCH ULTRA) test strip USE TO TEST BLOOD SUGAR 3 TIMES A DAY 01/25/22   Burchette, Elberta Fortis, MD  hydrochlorothiazide (MICROZIDE) 12.5 MG capsule TAKE 1 CAPSULE BY MOUTH EVERY DAY 01/02/22   Burchette, Elberta Fortis, MD  Lancet Device MISC Use 1-4 times daily as needed or directed.  DX E11.9 11/01/20   Burchette, Elberta Fortis, MD  Lancets [facility] ULTRASOFT) lancets Check 3 times daily. E11.9 11/30/20   Burchette,  Elberta Fortis, MD  levothyroxine (SYNTHROID) 25 MCG tablet Take 1 tablet (25 mcg total) by mouth daily. 11/10/21   Burchette, Elberta Fortis, MD  lipase/protease/amylase (CREON) 12000-38000 units CPEP capsule Take by mouth 3 (three) times daily before meals.    [provider]  ondansetron (ZOFRAN-ODT) 4 MG disintegrating tablet Take 1 tablet (4 mg total) by mouth every 8 (eight) hours as needed for nausea or vomiting. 02/21/22   Burchette, Elberta Fortis, MD  polyethylene glycol (MIRALAX / Ethelene Hal) packet Take 17 g by mouth daily as needed for mild constipation.    [provider]  tamsulosin (FLOMAX) 0.4 MG CAPS capsule TAKE 1 CAPSULE BY MOUTH EVERY DAY 01/26/22   Burchette, Elberta Fortis, MD  triamcinolone cream (KENALOG) 0.1 % APPLY  CREAM EXTERNALLY TO AFFECTED AREA TWICE DAILY AS NEEDED 11/28/19   Burchette, Elberta Fortis, MD      Allergies    Amoxicillin    Review of Systems   Review of Systems  All other systems reviewed and are negative.  Physical Exam Updated Vital Signs BP 106/68   Pulse 74   Temp 98 F (36.7 C) (Oral)   Resp 16   Ht 1.727 m (5\' 8" )   Wt 68.5 kg   SpO2 97%   BMI 22.96 kg/m  Physical Exam Vitals and nursing note reviewed.  Constitutional:  General: He is not in acute distress.    Appearance: Normal appearance. He is well-developed. He is not toxic-appearing.  HENT:     Head: Normocephalic and atraumatic.  Eyes:     General: Lids are normal.     Conjunctiva/sclera: Conjunctivae normal.     Pupils: Pupils are equal, round, and reactive to light.  Neck:     Thyroid: No thyroid mass.     Trachea: No tracheal deviation.  Cardiovascular:     Rate and Rhythm: Normal rate and regular rhythm.     Heart sounds: Normal heart sounds. No murmur heard.   No gallop.  Pulmonary:     Effort: Pulmonary effort is normal. No respiratory distress.     Breath sounds: Normal breath sounds. No stridor. No decreased breath sounds, wheezing, rhonchi or rales.  Abdominal:      General: There is no distension.     Palpations: Abdomen is soft.     Tenderness: There is no abdominal tenderness. There is no rebound.  Musculoskeletal:        General: No tenderness. Normal range of motion.     Cervical back: Normal range of motion and neck supple.  Skin:    General: Skin is warm and dry.     Findings: No abrasion or rash.  Neurological:     General: No focal deficit present.     Mental Status: He is alert and oriented to person, place, and time. Mental status is at baseline.     GCS: GCS eye subscore is 4. GCS verbal subscore is 5. GCS motor subscore is 6.     Cranial Nerves: No cranial nerve deficit.     Sensory: No sensory deficit.     Motor: Motor function is intact.     Comments: Symptoms 5 of 5 in upper as well as lower extremities.  No facial symmetry.  Psychiatric:        Attention and Perception: Attention normal.        Speech: Speech normal.        Behavior: Behavior normal.    ED Results / Procedures / Treatments   Labs (all labs ordered are listed, but only abnormal results are displayed) Labs Reviewed  CBC WITH DIFFERENTIAL/PLATELET  URINALYSIS, ROUTINE W REFLEX MICROSCOPIC  COMPREHENSIVE METABOLIC PANEL  CBG MONITORING, ED    EKG EKG Interpretation  Date/Time:  Wednesday February 22 2022 08:29:31 EDT Ventricular Rate:  80 PR Interval:    QRS Duration: 97 QT Interval:  395 QTC Calculation: 456 R Axis:   -9 Text Interpretation: Atrial fibrillation Multiple ventricular premature complexes Posterior infarct, old New from prior Confirmed by Lacretia Leigh ([id number]) on 02/22/2022 8:39:31 AM  Radiology No results found.  Procedures Procedures    Medications Ordered in ED Medications  0.9 %  sodium chloride infusion (has no administration in time range)    ED Course/ Medical Decision Making/ A&P                           Medical Decision Making Amount and/or Complexity of Data Reviewed Labs: ordered. Radiology: ordered. ECG/medicine  tests: ordered.  Risk Prescription drug management.  Patient is EKG per my interpretation shows atrial fibrillation.  This is new compared to prior although patient does have a history of A-fib but had an ablation 21 years ago. Patient here complaining of diffuse weakness.  Concern for possible infectious etiology.  He is afebrile here.  Urinalysis is  negative.  Chest x-ray does not any acute infiltrate per my interpretation.  Head CT per my interpretation shows no evidence of acute abnormality.  He was given IV fluids here and does feel better but remains weak.  Discussed with Dr. Tamala Julian from cardiology.  Patient will need echocardiogram as well as anticoagulation.  Patient likely need to have MRI done to make sure that patient has not had a CVA.  He will require admission.  Will consult hospitalist   CHA2DS2-VASc Score =   5  This indicates a  % annual risk of stroke. The patient's score is based upon:               Final Clinical Impression(s) / ED Diagnoses Final diagnoses:  None    Rx / DC Orders ED Discharge Orders     None         Lacretia Leigh, MD 02/22/22 1426    Lacretia Leigh, MD 02/22/22 [phone]

## 2022-02-22 NOTE — H&P (Addendum)
History and Physical    Steven Ward RDE:[id number] DOB: June 12, 1925 DOA: 02/22/2022  PCP: [name], MD  Patient coming from: [facility]  Chief Complaint: Generalized weakness  HPI: [name] is a 88 y.o. male with medical history significant of paroxysmal A-fib not on anticoagulation, hypertension, hypothyroidism, Parkinson's disease, lumbar spondylosis, type 2 diabetes, hyperlipidemia, PVD, CKD stage IIIb, remote history of colon cancer, BPH, pancreatic insufficiency, gallstones, GERD.  Patient was seen by PCP yesterday for complaints of generalized weakness, nausea, and poor oral intake.  Recent shingles but facial lesions are clearing and no significant pain.  His fasting blood glucose was 220 (A1c 7.7 on 02/08/2022) and dose of glipizide was increased from 2.5 mg to 5 mg daily.  It was unclear whether patient's nausea and generalized weakness related to glipizide as family told his PCP that he has been off of this for the past few days with no resolution of symptoms.  He was prescribed Zofran.  Patient presented to the ED today with complaints of generalized weakness x2 days.  Vital signs stable.  Labs showing WBC 3.6, hemoglobin 12.4 (stable), platelet count 166k.  Sodium 140, potassium 4.0, chloride 101, bicarb 26, BUN 100, creatinine 2.4, glucose 189.  LFTs normal.  High-sensitivity troponin negative.  UA without signs of infection.  Chest x-ray not suggestive of pneumonia.  CT head negative for acute finding.  EKG showing rate controlled A-fib, PVCs.  ED physician discussed the case with Dr. Tamala Julian from cardiology who recommended getting an echocardiogram, stroke work-up, and starting Eliquis due to high CHA2DS2-VASc score.  Patient was given 1 L normal saline bolus.  Patient reports 3 to 4-day history of generalized weakness.  States he vomited about 5 days ago and since then has continued to feel nauseous and is not able to eat food or drink enough fluids.  Reports constipation  with last bowel movement 5 days ago, not passing flatus.  Reports abdominal distention but denies abdominal pain.  Reports chills.  Denies fevers, cough, shortness of breath, or chest pain.  Denies any urinary symptoms.  Patient reports history of A-fib status post ablation 20 years ago.  Son is concerned that patient is too weak and was found on the floor yesterday.  Review of Systems:  Review of Systems  All other systems reviewed and are negative.  Past Medical History:  Diagnosis Date   ABNORMAL THYROID FUNCTION TESTS 01/17/2010   ALLERGIC RHINITIS 12/01/2008   Colon cancer ([facility])    COLONIC POLYPS, HX OF 12/01/2008   DIABETES MELLITUS, TYPE II 12/01/2008   EDEMA LEG 05/04/2009   ESSENTIAL HYPERTENSION 12/01/2008   Exocrine pancreatic insufficiency    GALLSTONES 04/07/2009   GERD 12/01/2008   Hay fever    SPONDYLOSIS, LUMBAR 12/01/2008    Past Surgical History:  Procedure Laterality Date   ANKLE SURGERY Right    CATARACT EXTRACTION     CHOLECYSTECTOMY     COLON SURGERY     resection 1990 for cancer   LEG SURGERY Left      reports that he quit smoking about 53 years ago. His smoking use included cigarettes. He has a 20.00 pack-year smoking history. He has never used smokeless tobacco. He reports that he does not drink alcohol and does not use drugs.  Allergies  Allergen Reactions   Amoxicillin     REACTION: rash, dizziness    Family History  Problem Relation Age of Onset   Cancer Other  colon   Diabetes Other    Stroke Other    Cancer Mother     Prior to Admission medications   Medication Sig Start Date End Date Taking? Authorizing Provider  blood glucose meter kit and supplies KIT Dispense based on patient and insurance preference. Use up to four times daily as directed. (FOR ICD-9 250.00, 250.01). 10/03/17   Burchette, Alinda Sierras, MD  Blood Glucose Monitoring Suppl (ONE TOUCH ULTRA 2) w/Device KIT Use as directed. 05/10/21   Burchette, Alinda Sierras, MD  Carbidopa-Levodopa  ER (SINEMET CR) 25-100 MG tablet controlled release TAKE 2 TABLETS BY MOUTH IN THE MORNING AND 2 IN THE AFTERNOON AND 1 IN THE EVENING 10/28/21   Burchette, Alinda Sierras, MD  furosemide (LASIX) 20 MG tablet Take one tablet by mouth once daily as needed for edema/swelling. 05/10/21   Burchette, Alinda Sierras, MD  glipiZIDE (GLUCOTROL) 5 MG tablet Take one half tablet by mouth once daily. 02/08/22   Burchette, Alinda Sierras, MD  glucose blood (ONETOUCH ULTRA) test strip USE TO TEST BLOOD SUGAR 3 TIMES A DAY 01/25/22   Burchette, Alinda Sierras, MD  hydrochlorothiazide (MICROZIDE) 12.5 MG capsule TAKE 1 CAPSULE BY MOUTH EVERY DAY 01/02/22   Burchette, Alinda Sierras, MD  Lancet Device MISC Use 1-4 times daily as needed or directed.  DX E11.9 11/01/20   Burchette, Alinda Sierras, MD  Lancets [facility] ULTRASOFT) lancets Check 3 times daily. E11.9 11/30/20   Burchette, Alinda Sierras, MD  levothyroxine (SYNTHROID) 25 MCG tablet Take 1 tablet (25 mcg total) by mouth daily. 11/10/21   Burchette, Alinda Sierras, MD  lipase/protease/amylase (CREON) 12000-38000 units CPEP capsule Take by mouth 3 (three) times daily before meals.    [provider]  ondansetron (ZOFRAN-ODT) 4 MG disintegrating tablet Take 1 tablet (4 mg total) by mouth every 8 (eight) hours as needed for nausea or vomiting. 02/21/22   Burchette, Alinda Sierras, MD  polyethylene glycol (MIRALAX / Floria Raveling) packet Take 17 g by mouth daily as needed for mild constipation.    [provider]  tamsulosin (FLOMAX) 0.4 MG CAPS capsule TAKE 1 CAPSULE BY MOUTH EVERY DAY 01/26/22   Burchette, Alinda Sierras, MD  triamcinolone cream (KENALOG) 0.1 % APPLY  CREAM EXTERNALLY TO AFFECTED AREA TWICE DAILY AS NEEDED 11/28/19   [name], MD    Physical Exam: Vitals:   02/22/22 1445 02/22/22 1722 02/22/22 1745 02/22/22 1840  BP: 105/68  106/68 99/65  Pulse: 72  84 84  Resp: $Remo'16  15 16  'BjdJk$ Temp:  98.7 F (37.1 C)  98.2 F (36.8 C)  TempSrc:  Oral  Oral  SpO2: 98%  98% 97%  Weight:      Height:         Physical Exam Vitals reviewed.  Constitutional:      General: He is not in acute distress. HENT:     Head: Normocephalic and atraumatic.  Eyes:     Extraocular Movements: Extraocular movements intact.     Conjunctiva/sclera: Conjunctivae normal.  Cardiovascular:     Rate and Rhythm: Normal rate. Rhythm irregular.     Pulses: Normal pulses.  Pulmonary:     Effort: Pulmonary effort is normal. No respiratory distress.     Breath sounds: No wheezing.  Abdominal:     General: There is distension.     Palpations: Abdomen is soft.     Tenderness: There is no abdominal tenderness. There is no guarding.     Comments: Bowel sounds present  Musculoskeletal:  General: No swelling or tenderness.     Cervical back: Normal range of motion.  Skin:    General: Skin is warm and dry.  Neurological:     General: No focal deficit present.     Mental Status: He is alert and oriented to person, place, and time.     Labs on Admission: I have personally reviewed following labs and imaging studies  CBC: Recent Labs  Lab 02/22/22 0847  WBC 3.6*  NEUTROABS 2.1  HGB 12.4*  HCT 36.8*  MCV 95.1  PLT 269   Basic Metabolic Panel: Recent Labs  Lab 02/22/22 0847  NA 140  K 4.0  CL 101  CO2 26  GLUCOSE 189*  BUN 100*  CREATININE 2.47*  CALCIUM 9.3   GFR: Estimated Creatinine Clearance: 17.3 mL/min (A) (by C-G formula based on SCr of 2.47 mg/dL (H)). Liver Function Tests: Recent Labs  Lab 02/22/22 0847  AST 18  ALT 18  ALKPHOS 55  BILITOT 1.2  PROT 6.8  ALBUMIN 3.4*   No results for input(s): LIPASE, AMYLASE in the last 168 hours. No results for input(s): AMMONIA in the last 168 hours. Coagulation Profile: No results for input(s): INR, PROTIME in the last 168 hours. Cardiac Enzymes: No results for input(s): CKTOTAL, CKMB, CKMBINDEX, TROPONINI in the last 168 hours. BNP (last 3 results) No results for input(s): PROBNP in the last 8760 hours. HbA1C: No results for  input(s): HGBA1C in the last 72 hours. CBG: Recent Labs  Lab 02/22/22 0845  GLUCAP 183*   Lipid Profile: No results for input(s): CHOL, HDL, LDLCALC, TRIG, CHOLHDL, LDLDIRECT in the last 72 hours. Thyroid Function Tests: No results for input(s): TSH, T4TOTAL, FREET4, T3FREE, THYROIDAB in the last 72 hours. Anemia Panel: No results for input(s): VITAMINB12, FOLATE, FERRITIN, TIBC, IRON, RETICCTPCT in the last 72 hours. Urine analysis:    Component Value Date/Time   COLORURINE YELLOW 02/22/2022 1215   APPEARANCEUR CLEAR 02/22/2022 1215   LABSPEC 1.019 02/22/2022 1215   PHURINE 5.0 02/22/2022 1215   GLUCOSEU NEGATIVE 02/22/2022 1215   HGBUR NEGATIVE 02/22/2022 1215   BILIRUBINUR NEGATIVE 02/22/2022 1215   BILIRUBINUR neg 11/05/2013 1708   KETONESUR NEGATIVE 02/22/2022 1215   PROTEINUR NEGATIVE 02/22/2022 1215   UROBILINOGEN 0.2 11/05/2013 1708   NITRITE NEGATIVE 02/22/2022 1215   LEUKOCYTESUR NEGATIVE 02/22/2022 1215    Radiological Exams on Admission: I have personally reviewed images CT Head Wo Contrast  Result Date: 02/22/2022 CLINICAL DATA:  Neuro deficit, acute, stroke suspected EXAM: CT HEAD WITHOUT CONTRAST TECHNIQUE: Contiguous axial images were obtained from the base of the skull through the vertex without intravenous contrast. RADIATION DOSE REDUCTION: This exam was performed according to the departmental dose-optimization program which includes automated exposure control, adjustment of the mA and/or kV according to patient size and/or use of iterative reconstruction technique. COMPARISON:  None Available. FINDINGS: Brain: No evidence of acute large vascular territory infarction, hemorrhage, hydrocephalus, extra-axial collection or mass lesion/mass effect. Patchy white matter hypodensities, nonspecific but compatible with chronic microvascular disease. Cerebral atrophy. Vascular: No hyperdense vessel identified. Calcific intracranial atherosclerosis. Skull: No acute fracture.  Sinuses/Orbits: Minimal paranasal sinus mucosal thickening. No acute orbital findings. Other: Trace right mastoid effusion. IMPRESSION: No evidence of acute intracranial abnormality. Electronically Signed   By: Margaretha Sheffield M.D.   On: 02/22/2022 08:57   DG Chest Port 1 View  Result Date: 02/22/2022 CLINICAL DATA:  Shortness of breath.  Weakness and fatigue. EXAM: PORTABLE CHEST 1 VIEW COMPARISON:  06/27/2011  FINDINGS: Suspected calcified granuloma in the left upper lobe, also present back on 2012. Indistinct 4.3 by 1.6 cm left upper lobe bandlike density, possibly at least partially from superimposition of vascular shadows and the first rib end, but an underlying pulmonary nodule cannot be readily excluded. Atherosclerotic calcification of the aortic arch. Upper normal heart size given the AP technique. Linear subsegmental atelectasis or scarring at the lung bases. Mitral valve calcification. Right lower lateral rib deformities including the ninth rib, compatible with old healed fractures. Subtle interstitial accentuation in the lungs is nonspecific. IMPRESSION: 1. Indistinct bandlike density at the left lung apex, possibly from scarring or atelectasis or confluence of vascular and osseous shadows, but an underlying pulmonary nodule cannot be excluded and accordingly CT of the chest is recommended. 2. Mitral valve calcification. Aortic Atherosclerosis (ICD10-I70.0). 3. Old granulomatous disease. 4. Mild bibasilar scarring. 5. Interval but healed right lower lateral rib fractures. 6. Faint interstitial accentuation in the lungs, nonspecific. Electronically Signed   By: Van Clines M.D.   On: 02/22/2022 09:41    Assessment and Plan  Atrial fibrillation Patient with history of A-fib status post ablation 20 years ago.  Presenting with complaints of generalized weakness and found to be in A-fib, frequent PVCs on telemetry.  Rate currently in the 80s.  CHA2DS2-VASc score 5. ED physician discussed the  case with Dr. Tamala Julian from cardiology who recommended getting an echocardiogram, stroke work-up, and starting Eliquis due to high CHA2DS2-VASc score. Discussed CHA2DS2-VASc score and risk of stroke with the patient and his son.  Discussed benefit versus risk of anticoagulation.  Patient's son is concerned about risk of bleeding with anticoagulation given patient's advanced age and high fall risk.  He prefers not starting anticoagulation. -Cardiac monitoring -Echocardiogram -Check TSH and Mag -Discussed with Dr. Rudi Rummage, recommending starting metoprolol 12.5 mg twice daily.  Cardiology team will consult in the morning.  Generalized weakness A-fib likely contributing.  CT head negative for acute finding.  No focal neurodeficit on exam but cardiology recommending stroke work-up given A-fib and high CHA2DS2-VASc score not on anticoagulation. A1c 7.7 on 02/08/2022. -Brain MRI -Echocardiogram -Lipid panel -PT/OT/SLP eval  Nausea/vomiting Patient is not reporting abdominal pain but his abdomen appears distended.  Last bowel movement was 5 days ago, not passing flatus. ?Constipation versus possible bowel obstruction.  LFTs normal. -CT abdomen pelvis -Zofran as needed  Addendum 02/22/2022 at 11:08 PM: CT concerning for mechanical mid to distal SBO/possible internal hernia.  No definite bowel wall thickening, no pneumatosis, and no free air.  There are additional findings on CT chest/abdomen/pelvis, please read full report. Patient is not actively vomiting at this time, not endorsing abdominal pain. -Dr. Zenia Resides had initially recommended NG tube but after evaluating the patient she feels that we can hold off on NG tube placement at this time as patient's abdomen is soft and nontender and he is not vomiting.   -Continue bowel rest, keep n.p.o.  Addendum 02/22/2022 at 11:42 PM: Dr. Zenia Resides reviewed the patient's scan again, recommending NG tube placement now. Also, CT concerning for possible aspiration pneumonia.   Started on ceftriaxone and Flagyl.  AKI on CKD stage IIIb Likely prerenal from dehydration given poor oral intake for several days.  Creatinine 2.4, was 2.2 on 11/09/2021 and previously 1.6-1.9. -Continue IV fluid hydration -Monitor renal function -Avoid nephrotoxic agents/contrast -Hold home Lasix and hydrochlorothiazide.  Abnormal chest x-ray finding Chest x-ray showing indistinct bandlike density at the left lung apex, possibly from scarring or atelectasis or confluence of  vascular and osseous shadows but an underlying pulmonary nodule cannot be excluded. -CT chest ordered for further evaluation  Hypertension Stable. -Started on metoprolol given A-fib -Hold home Lasix and hydrochlorothiazide.  Type 2 diabetes A1c 7.7 on 02/08/2022. -Hold oral hypoglycemic agents at this time. -Sensitive sliding scale insulin ACHS  Parkinson's disease -Continue Sinemet  Hypothyroidism -Continue Synthroid -Check TSH  BPH -Continue Flomax  Chronic pancreatic insufficiency -Continue Creon  DVT prophylaxis: SCDs Code Status: DNR/DNI.  Discussed with the patient and his son. Family Communication: Patient's son [name] at bedside. Consults called: Cardiology Level of care: Telemetry bed Admission status: It is my clinical opinion that referral for OBSERVATION is reasonable and necessary in this patient based on the above information provided. The aforementioned taken together are felt to place the patient at high risk for further clinical deterioration. However, it is anticipated that the patient may be medically stable for discharge from the hospital within 24 to 48 hours.   Shela Leff MD [facility]  If 7PM-7AM, please contact night-coverage [email or web address]  02/22/2022, 7:24 PM

## 2022-02-22 NOTE — ED Notes (Signed)
Attempted to obtain urine sample from pt, pt attempted to go but was unable to urinate.

## 2022-02-22 NOTE — ED Triage Notes (Addendum)
Pt was recently treated for shingles and son states since then he has had some generalized weakness, fatigue, and poor appetite. Pt also reports he fell this morning on the way to the restroom.

## 2022-02-22 NOTE — Consult Note (Signed)
[place] Schorr Scaffidi Apr 24, 1925  [id number].    Requesting MD: Dr. Marlowe Sax Chief Complaint/Reason for Consult: small bowel obstruction  HPI:  Mr. Kana is a 88 yo male with a history of a-fib, DM, HLD, and CKD who presented to the ED today with generalized weakness and nausea. He had an extensive workup in the ED, which showed rate-controlled a-fib and PVCs. The patient was admitted to [facility] for cardiology workup. He was noted to have abdominal distension and had a CT scan, which showed small bowel dilation concerning for a small bowel obstruction. General surgery was consulted. At the time of my exam, the patient denies any current or recent abdominal pain. He says he has not vomited for 5 days and currently does not feel nauseated. He is not sure when his last bowel movement was. He says he takes Miralax at home for constipation.  He has previously had a colon resection, for colon cancer according to chart review (patient denied having any prior abdominal surgeries). He also had a lap chole in 2010.  ROS: Review of Systems  Constitutional:  Negative for chills and fever.  Respiratory:  Negative for shortness of breath.   Gastrointestinal:  Positive for constipation and nausea. Negative for abdominal pain and vomiting.  Neurological:  Positive for weakness.   Family History  Problem Relation Age of Onset   Cancer Other        colon   Diabetes Other    Stroke Other    Cancer Mother     Past Medical History:  Diagnosis Date   ABNORMAL THYROID FUNCTION TESTS 01/17/2010   ALLERGIC RHINITIS 12/01/2008   Colon cancer ([place])    COLONIC POLYPS, HX OF 12/01/2008   DIABETES MELLITUS, TYPE II 12/01/2008   EDEMA LEG 05/04/2009   ESSENTIAL HYPERTENSION 12/01/2008   Exocrine pancreatic insufficiency    GALLSTONES 04/07/2009   GERD 12/01/2008   Hay fever    SPONDYLOSIS, LUMBAR 12/01/2008    Past Surgical History:  Procedure Laterality Date   ANKLE SURGERY Right    CATARACT EXTRACTION      CHOLECYSTECTOMY     COLON SURGERY     resection 1990 for cancer   LEG SURGERY Left     Social History:  reports that he quit smoking about 53 years ago. His smoking use included cigarettes. He has a 20.00 pack-year smoking history. He has never used smokeless tobacco. He reports that he does not drink alcohol and does not use drugs.  Allergies:  Allergies  Allergen Reactions   Amoxicillin     REACTION: rash, dizziness    Medications Prior to Admission  Medication Sig Dispense Refill   Carbidopa-Levodopa ER (SINEMET CR) 25-100 MG tablet controlled release TAKE 2 TABLETS BY MOUTH IN THE MORNING AND 2 IN THE AFTERNOON AND 1 IN THE EVENING (Patient taking differently: 1-2 tablets See admin instructions. TAKE 2 TABLETS BY MOUTH IN THE MORNING AND 2 IN THE AFTERNOON AND 1 IN THE EVENING) 450 tablet 1   furosemide (LASIX) 20 MG tablet Take one tablet by mouth once daily as needed for edema/swelling. (Patient taking differently: Take 20 mg by mouth daily.) 30 tablet 1   glipiZIDE (GLUCOTROL) 5 MG tablet Take one half tablet by mouth once daily. (Patient taking differently: Take 5 mg by mouth daily before breakfast.) 60 tablet 3   hydrochlorothiazide (MICROZIDE) 12.5 MG capsule TAKE 1 CAPSULE BY MOUTH EVERY DAY (Patient taking differently: Take 12.5 mg by mouth daily.) 90 capsule 0  levothyroxine (SYNTHROID) 25 MCG tablet Take 1 tablet (25 mcg total) by mouth daily. 90 tablet 1   lipase/protease/amylase (CREON) 12000-38000 units CPEP capsule Take 12,000 Units by mouth 3 (three) times daily before meals.     ondansetron (ZOFRAN-ODT) 4 MG disintegrating tablet Take 1 tablet (4 mg total) by mouth every 8 (eight) hours as needed for nausea or vomiting. 20 tablet 0   polyethylene glycol (MIRALAX / GLYCOLAX) packet Take 17 g by mouth daily as needed for moderate constipation.     tamsulosin (FLOMAX) 0.4 MG CAPS capsule TAKE 1 CAPSULE BY MOUTH EVERY DAY (Patient taking differently: 0.4 mg daily.) 90  capsule 0   triamcinolone cream (KENALOG) 0.1 % APPLY  CREAM EXTERNALLY TO AFFECTED AREA TWICE DAILY AS NEEDED (Patient taking differently: 1 application. daily as needed (itching).) 80 g 2   blood glucose meter kit and supplies KIT Dispense based on patient and insurance preference. Use up to four times daily as directed. (FOR ICD-9 250.00, 250.01). 1 each 0   Blood Glucose Monitoring Suppl (ONE TOUCH ULTRA 2) w/Device KIT Use as directed. 1 kit 0   glucose blood (ONETOUCH ULTRA) test strip USE TO TEST BLOOD SUGAR 3 TIMES A DAY 300 strip 1   Lancet Device MISC Use 1-4 times daily as needed or directed.  DX E11.9 1 each 3   Lancets (ONETOUCH ULTRASOFT) lancets Check 3 times daily. E11.9 300 each 3     Physical Exam: Blood pressure 117/70, pulse 87, temperature 98.3 F (36.8 C), temperature source Oral, resp. rate 18, height $RemoveBe'5\' 8"'lPIDiyYel$  (1.727 m), weight 65.3 kg, SpO2 97 %. General: resting comfortably, appears stated age, no apparent distress Neurological: alert, mildly disoriented, no focal deficits, cranial nerves grossly in tact HEENT: normocephalic, atraumatic, oropharynx clear, no scleral icterus CV: irregular rhythm, extremities warm and well-perfused Respiratory: normal work of breathing on room air, symmetric chest wall expansion Abdomen: Distended but very soft and nontender to deep palpation. Well-healed midline surgical scar. No masses or organomegaly. Extremities: warm and well-perfused, no deformities, moving all extremities spontaneously Skin: warm and dry, no jaundice, no rashes or lesions   Results for orders placed or performed during the hospital encounter of 02/22/22 (from the past 48 hour(s))  POC CBG, ED     Status: Abnormal   Collection Time: 02/22/22  8:45 AM  Result Value Ref Range   Glucose-Capillary 183 (H) 70 - 99 mg/dL    Comment: Glucose reference range applies only to samples taken after fasting for at least 8 hours.  CBC with Differential/Platelet     Status:  Abnormal   Collection Time: 02/22/22  8:47 AM  Result Value Ref Range   WBC 3.6 (L) 4.0 - 10.5 K/uL   RBC 3.87 (L) 4.22 - 5.81 MIL/uL   Hemoglobin 12.4 (L) 13.0 - 17.0 g/dL   HCT 36.8 (L) 39.0 - 52.0 %   MCV 95.1 80.0 - 100.0 fL   MCH 32.0 26.0 - 34.0 pg   MCHC 33.7 30.0 - 36.0 g/dL   RDW 14.3 11.5 - 15.5 %   Platelets 166 150 - 400 K/uL   nRBC 0.0 0.0 - 0.2 %   Neutrophils Relative % 58 %   Neutro Abs 2.1 1.7 - 7.7 K/uL   Lymphocytes Relative 22 %   Lymphs Abs 0.8 0.7 - 4.0 K/uL   Monocytes Relative 19 %   Monocytes Absolute 0.7 0.1 - 1.0 K/uL   Eosinophils Relative 0 %   Eosinophils Absolute 0.0 0.0 -  0.5 K/uL   Basophils Relative 0 %   Basophils Absolute 0.0 0.0 - 0.1 K/uL   Immature Granulocytes 1 %   Abs Immature Granulocytes 0.02 0.00 - 0.07 K/uL    Comment: Performed at [facility], [address], [place], [place] [address]  Comprehensive metabolic panel     Status: Abnormal   Collection Time: 02/22/22  8:47 AM  Result Value Ref Range   Sodium 140 135 - 145 mmol/L   Potassium 4.0 3.5 - 5.1 mmol/L   Chloride 101 98 - 111 mmol/L   CO2 26 22 - 32 mmol/L   Glucose, Bld 189 (H) 70 - 99 mg/dL    Comment: Glucose reference range applies only to samples taken after fasting for at least 8 hours.   BUN 100 (H) 8 - 23 mg/dL   Creatinine, Ser 2.47 (H) 0.61 - 1.24 mg/dL   Calcium 9.3 8.9 - 10.3 mg/dL   Total Protein 6.8 6.5 - 8.1 g/dL   Albumin 3.4 (L) 3.5 - 5.0 g/dL   AST 18 15 - 41 U/L   ALT 18 0 - 44 U/L   Alkaline Phosphatase 55 38 - 126 U/L   Total Bilirubin 1.2 0.3 - 1.2 mg/dL   GFR, Estimated 23 (L) >60 mL/min    Comment: (NOTE) Calculated using the CKD-EPI Creatinine Equation (2021)    Anion gap 13 5 - 15    Comment: Performed at [facility], [place], [place] [address]  Troponin I (High Sensitivity)     Status: None   Collection Time: 02/22/22  8:47 AM  Result Value Ref Range   Troponin I (High  Sensitivity) 13 <18 ng/L    Comment: (NOTE) Elevated high sensitivity troponin I (hsTnI) values and significant  changes across serial measurements may suggest ACS but many other  chronic and acute conditions are known to elevate hsTnI results.  Refer to the "Links" section for chest pain algorithms and additional  guidance. Performed at [facility], [address], [place], [place] [address]   Urinalysis, Routine w reflex microscopic     Status: None   Collection Time: 02/22/22 12:15 PM  Result Value Ref Range   Color, Urine YELLOW YELLOW   APPearance CLEAR CLEAR   Specific Gravity, Urine 1.019 1.005 - 1.030   pH 5.0 5.0 - 8.0   Glucose, UA NEGATIVE NEGATIVE mg/dL   Hgb urine dipstick NEGATIVE NEGATIVE   Bilirubin Urine NEGATIVE NEGATIVE   Ketones, ur NEGATIVE NEGATIVE mg/dL   Protein, ur NEGATIVE NEGATIVE mg/dL   Nitrite NEGATIVE NEGATIVE   Leukocytes,Ua NEGATIVE NEGATIVE    Comment: Performed at [facility], [place], [place] [address]  TSH     Status: None   Collection Time: 02/22/22  8:32 PM  Result Value Ref Range   TSH 2.173 0.350 - 4.500 uIU/mL    Comment: Performed by a 3rd Generation assay with a functional sensitivity of <=0.01 uIU/mL. Performed at [facility], [place] [address] [place], [place] [address]   Magnesium     Status: None   Collection Time: 02/22/22  8:32 PM  Result Value Ref Range   Magnesium 2.0 1.7 - 2.4 mg/dL    Comment: Performed at [facility], [place] [address] [place], [place] [address]   CT Head Wo Contrast  Result Date: 02/22/2022 CLINICAL DATA:  Neuro deficit, acute, stroke suspected EXAM: CT HEAD WITHOUT CONTRAST TECHNIQUE: Contiguous axial images were obtained  from the base of the skull through the vertex without intravenous contrast. RADIATION DOSE REDUCTION: This exam was performed according to the departmental dose-optimization program which includes automated exposure  control, adjustment of the mA and/or kV according to patient size and/or use of iterative reconstruction technique. COMPARISON:  None Available. FINDINGS: Brain: No evidence of acute large vascular territory infarction, hemorrhage, hydrocephalus, extra-axial collection or mass lesion/mass effect. Patchy white matter hypodensities, nonspecific but compatible with chronic microvascular disease. Cerebral atrophy. Vascular: No hyperdense vessel identified. Calcific intracranial atherosclerosis. Skull: No acute fracture. Sinuses/Orbits: Minimal paranasal sinus mucosal thickening. No acute orbital findings. Other: Trace right mastoid effusion. IMPRESSION: No evidence of acute intracranial abnormality. Electronically Signed   By: Margaretha Sheffield M.D.   On: 02/22/2022 08:57   MR BRAIN WO CONTRAST  Result Date: 02/22/2022 CLINICAL DATA:  Acute neurologic deficit EXAM: MRI HEAD WITHOUT CONTRAST TECHNIQUE: Multiplanar, multiecho pulse sequences of the brain and surrounding structures were obtained without intravenous contrast. COMPARISON:  None Available. FINDINGS: Brain: Punctate acute infarct within the right cerebellar hemisphere. There is mildly hyperintense signal on diffusion-weighted imaging in subcortical right frontal lobe without a clear ADC correlate. No acute or chronic hemorrhage. There is multifocal hyperintense T2-weighted signal within the white matter. Generalized cerebral volume loss. The midline structures are normal. Vascular: Major flow voids are preserved. Skull and upper cervical spine: Normal calvarium and skull base. Visualized upper cervical spine and soft tissues are normal. Sinuses/Orbits:Right mastoid effusion. Paranasal sinuses are clear. Normal orbits. IMPRESSION: 1. Punctate acute infarct within the right cerebellar hemisphere. No hemorrhage or mass effect. 2. Mildly hyperintense signal on diffusion-weighted imaging in the subcortical right frontal lobe without a clear ADC correlate. This  may indicate a subacute infarct. 3. Chronic small vessel ischemic disease and cerebral volume loss. Electronically Signed   By: Ulyses Jarred M.D.   On: 02/22/2022 21:46   DG Chest Port 1 View  Result Date: 02/22/2022 CLINICAL DATA:  Shortness of breath.  Weakness and fatigue. EXAM: PORTABLE CHEST 1 VIEW COMPARISON:  06/27/2011 FINDINGS: Suspected calcified granuloma in the left upper lobe, also present back on 2012. Indistinct 4.3 by 1.6 cm left upper lobe bandlike density, possibly at least partially from superimposition of vascular shadows and the first rib end, but an underlying pulmonary nodule cannot be readily excluded. Atherosclerotic calcification of the aortic arch. Upper normal heart size given the AP technique. Linear subsegmental atelectasis or scarring at the lung bases. Mitral valve calcification. Right lower lateral rib deformities including the ninth rib, compatible with old healed fractures. Subtle interstitial accentuation in the lungs is nonspecific. IMPRESSION: 1. Indistinct bandlike density at the left lung apex, possibly from scarring or atelectasis or confluence of vascular and osseous shadows, but an underlying pulmonary nodule cannot be excluded and accordingly CT of the chest is recommended. 2. Mitral valve calcification. Aortic Atherosclerosis (ICD10-I70.0). 3. Old granulomatous disease. 4. Mild bibasilar scarring. 5. Interval but healed right lower lateral rib fractures. 6. Faint interstitial accentuation in the lungs, nonspecific. Electronically Signed   By: Van Clines M.D.   On: 02/22/2022 09:41   CT CHEST ABDOMEN PELVIS WO CONTRAST  Result Date: 02/22/2022 CLINICAL DATA:  Inpatient. Abdominal distension. Poor p.o. intake. Generalized weakness. Or abnormal chest radiograph with possible pulmonary nodule. Remote history of resected left colon cancer in 1993 per prior report. * Tracking Code: BO * EXAM: CT CHEST, ABDOMEN AND PELVIS WITHOUT CONTRAST TECHNIQUE: Multidetector  CT imaging of the chest, abdomen and pelvis was performed following the  standard protocol without IV contrast. RADIATION DOSE REDUCTION: This exam was performed according to the departmental dose-optimization program which includes automated exposure control, adjustment of the mA and/or kV according to patient size and/or use of iterative reconstruction technique. COMPARISON:  Chest radiograph from earlier today. 06/30/2009 CT abdomen/pelvis. FINDINGS: CT CHEST FINDINGS Cardiovascular: Normal heart size. No significant pericardial effusion/thickening. Three-vessel coronary atherosclerosis. Atherosclerotic nonaneurysmal thoracic aorta. Normal caliber pulmonary arteries. Mediastinum/Nodes: No discrete thyroid nodules. Unremarkable esophagus. No pathologically enlarged axillary, mediastinal or hilar lymph nodes, noting limited sensitivity for the detection of hilar adenopathy on this noncontrast study. Lungs/Pleura: No pneumothorax. Scattered calcified bilateral pleural plaques anteriorly and posteriorly. Trace bilateral pleural effusions, right greater than left. Ovoid 3.5 x 2.4 cm subpleural focus of consolidation in the dependent basilar right lower lobe (series 5/image 103), new from 2010 CT. Patchy confluent subpleural reticulation and ground-glass opacity with associated mild traction bronchiolectasis at both lung bases, mildly increased from prior CT. Mild-to-moderate patchy tree-in-bud opacities in the dependent basilar right upper lobe. No additional significant pulmonary nodules. Musculoskeletal: No aggressive appearing focal osseous lesions. Healed anterolateral right seventh through tenth rib fractures. Moderate T5 vertebral compression fracture of indeterminate chronicity and chronic severe T12 vertebral compression fracture. Mild thoracic spondylosis. CT ABDOMEN PELVIS FINDINGS Hepatobiliary: Normal liver with no liver mass. Cholecystectomy. Bile ducts are within normal post cholecystectomy limits with  CBD diameter 7 mm. Pancreas: Progressive coarse calcifications throughout the atrophic pancreas with irregular dilated main pancreatic duct up to 7 mm diameter, compatible with chronic pancreatitis. No discrete pancreatic mass. No peripancreatic fluid collections. Spleen: Normal size. No mass. Adrenals/Urinary Tract: Normal adrenals. No renal stones. No hydronephrosis. Simple 1.2 cm posterior upper left renal cyst for which no follow-up is recommended. No additional contour deforming renal masses. Normal bladder. Stomach/Bowel: Mildly distended stomach with air-fluid level. No gastric wall thickening. Diffuse dilatation of the proximal to mid small bowel with air-fluid levels, measuring up to 4.8 cm diameter. Distal small bowel is collapsed. Apparent focal small bowel caliber transition in the right abdomen (series 6/image 38). There is associated fat stranding and ill-defined fluid throughout the small bowel mesentery. There is some mesenteric twisting at the site of the caliber transition. No definite bowel wall thickening or pneumatosis. Normal appendix. Postsurgical change from partial distal colectomy with intact appearing colorectal anastomosis. No large bowel wall thickening, diverticulosis or significant pericolonic fat stranding. Vascular/Lymphatic: Atherosclerotic nonaneurysmal abdominal aorta. No pathologically enlarged lymph nodes in the abdomen or pelvis. Reproductive: Normal size prostate. No pneumoperitoneum. Small volume ascites. No focal fluid collection. Other: No pneumoperitoneum, ascites or focal fluid collection. Surgical clips noted throughout the midline ventral abdominal wall. Musculoskeletal: No aggressive appearing focal osseous lesions. Marked lumbar spondylosis. IMPRESSION: 1. Mechanical mid to distal small-bowel obstruction with apparent focal small bowel caliber transition in the right abdomen, with associated fat stranding and ill-defined fluid throughout the small bowel mesentery and  with some mesenteric twisting, raising the possibility of internal hernia. No definite bowel wall thickening. No pneumatosis. No free air. 2. Small volume ascites. 3. Bilateral calcified pleural plaques and trace bilateral pleural effusions, compatible with asbestos related pleural disease. Basilar predominant pulmonary fibrosis compatible with asbestosis. 4. Ovoid 3.5 cm subpleural focus of consolidation in the dependent basilar right lower lobe, new from 2010 CT, indeterminate for rounded atelectasis versus pulmonary neoplasm. Suggest attention on follow-up chest CT in 3 months. 5. Mild-to-moderate patchy tree-in-bud opacities in the dependent basilar right upper lobe, compatible with nonspecific infectious or inflammatory bronchiolitis, with the  differential including aspiration. 6. Chronic pancreatitis. 7. Three-vessel coronary atherosclerosis. 8. Moderate T5 vertebral compression fracture of indeterminate chronicity and chronic severe T12 vertebral compression fracture. 9. Aortic Atherosclerosis (ICD10-I70.0). These results will be called to the ordering clinician or representative by the Radiologist Assistant, and communication documented in the PACS or [employer]. Electronically Signed   By: Ilona Sorrel M.D.   On: 02/22/2022 21:39      Assessment/Plan This is a 88 yo male with a history of a-fib presenting with weakness. CT scan shows small bowel dilation, concerning for an SBO, as well as a possible internal hernia. He is nontoxic in appearance and has no abdominal pain or tenderness on exam, thus I believe an internal hernia is unlikely and do not recommend urgent operative exploration. This is likely an adhesive SBO. - NPO, place NG tube to low intermittent suction - Will allow decompression overnight and plan for SBO protocol in am - Serial abdominal exams - Surgery will follow  Level of MDM: Moderate  Michaelle Birks, MD [facility] General, Hepatobiliary and Pancreatic  Surgery 02/22/22 11:22 PM

## 2022-02-22 NOTE — ED Notes (Signed)
Attempted to obtain urine specimen from pt, pt states he still does not feel that he is able to urinate at this time.

## 2022-02-22 NOTE — Progress Notes (Signed)
Plan of Care Note for accepted transfer   Patient: Steven Ward MRN: [id number]   DOA: 02/22/2022  Facility requesting transfer: [facility] Requesting Provider: Dr. Zenia Resides Reason for transfer: weakness, ? New/recurrent afib. Facility course: 88 year old with history T2DM, HLD, PVD, PD, PAF, CKD stage 3, remote hx of colon cancer, BPH who presented to ED with complaints of weakness x 2 days. EKG shows him back in atrial fibrillation which may be new? Hx of ablation 20 years ago. He spoke to Dr. Tamala Julian from cardiology who recommended get echo, stroke w/u and start eliquis due to high CHA2Ds2VASC score.    Saw his PCP yesterday for nausea and ? If due to new px of glipizide. Also had shingles on his face recently. On no pain meds and lesions clearing per PCP.    Vitals: stable,  Pertinent labs: creatinine: 2.47 (1.9-2.2) CT head: no acute finding   Plan of care: The patient is accepted for admission to Telemetry unit, at [facility]  Needs MRI, echo, stroke w/u and discussion regarding eliquis now that back in atrial fib.   Author: Orma Flaming, MD 02/22/2022  Check [email or web address] for on-call coverage.  Nursing staff, Please call [place] number on Amion as soon as patient's arrival, so appropriate admitting provider can evaluate the pt.

## 2022-02-22 NOTE — Progress Notes (Signed)
Received report from [place], [place].   Pt alert and oriented x4. Pt resting in bed without distress; room air; VSS. Pt on the monitor; A fib. Wound care consult placed; pt has skin breakdown on right side of inner gluteal fold and redness. Pt aware of POC and verbalized understanding. Call bell within reach. Rise and fall of chest noted. Pt denies any further questions or concerns at this time.   Spoke with Dr. Zenia Resides; NG orders cancelled. Pt aware and verbalized understanding to notify staff if nauseous/vomiting.

## 2022-02-23 ENCOUNTER — Inpatient Hospital Stay (HOSPITAL_COMMUNITY): Payer: PPO

## 2022-02-23 ENCOUNTER — Observation Stay (HOSPITAL_COMMUNITY): Payer: PPO

## 2022-02-23 DIAGNOSIS — E1169 Type 2 diabetes mellitus with other specified complication: Secondary | ICD-10-CM | POA: Diagnosis not present

## 2022-02-23 DIAGNOSIS — I700 Atherosclerosis of aorta: Secondary | ICD-10-CM | POA: Diagnosis present

## 2022-02-23 DIAGNOSIS — J9811 Atelectasis: Secondary | ICD-10-CM | POA: Diagnosis present

## 2022-02-23 DIAGNOSIS — Z4659 Encounter for fitting and adjustment of other gastrointestinal appliance and device: Secondary | ICD-10-CM | POA: Diagnosis not present

## 2022-02-23 DIAGNOSIS — Z4682 Encounter for fitting and adjustment of non-vascular catheter: Secondary | ICD-10-CM | POA: Diagnosis not present

## 2022-02-23 DIAGNOSIS — I63449 Cerebral infarction due to embolism of unspecified cerebellar artery: Secondary | ICD-10-CM | POA: Diagnosis not present

## 2022-02-23 DIAGNOSIS — K5939 Other megacolon: Secondary | ICD-10-CM | POA: Diagnosis not present

## 2022-02-23 DIAGNOSIS — E872 Acidosis, unspecified: Secondary | ICD-10-CM | POA: Diagnosis present

## 2022-02-23 DIAGNOSIS — R918 Other nonspecific abnormal finding of lung field: Secondary | ICD-10-CM | POA: Diagnosis not present

## 2022-02-23 DIAGNOSIS — I480 Paroxysmal atrial fibrillation: Secondary | ICD-10-CM | POA: Diagnosis present

## 2022-02-23 DIAGNOSIS — K56609 Unspecified intestinal obstruction, unspecified as to partial versus complete obstruction: Secondary | ICD-10-CM

## 2022-02-23 DIAGNOSIS — K6389 Other specified diseases of intestine: Secondary | ICD-10-CM | POA: Diagnosis not present

## 2022-02-23 DIAGNOSIS — E876 Hypokalemia: Secondary | ICD-10-CM | POA: Diagnosis not present

## 2022-02-23 DIAGNOSIS — J8410 Pulmonary fibrosis, unspecified: Secondary | ICD-10-CM | POA: Diagnosis not present

## 2022-02-23 DIAGNOSIS — K567 Ileus, unspecified: Secondary | ICD-10-CM | POA: Diagnosis not present

## 2022-02-23 DIAGNOSIS — I63441 Cerebral infarction due to embolism of right cerebellar artery: Secondary | ICD-10-CM | POA: Diagnosis present

## 2022-02-23 DIAGNOSIS — Z66 Do not resuscitate: Secondary | ICD-10-CM | POA: Diagnosis present

## 2022-02-23 DIAGNOSIS — R531 Weakness: Secondary | ICD-10-CM | POA: Diagnosis not present

## 2022-02-23 DIAGNOSIS — K219 Gastro-esophageal reflux disease without esophagitis: Secondary | ICD-10-CM | POA: Diagnosis present

## 2022-02-23 DIAGNOSIS — N1832 Chronic kidney disease, stage 3b: Secondary | ICD-10-CM | POA: Diagnosis present

## 2022-02-23 DIAGNOSIS — K5669 Other partial intestinal obstruction: Secondary | ICD-10-CM | POA: Diagnosis not present

## 2022-02-23 DIAGNOSIS — J61 Pneumoconiosis due to asbestos and other mineral fibers: Secondary | ICD-10-CM | POA: Diagnosis present

## 2022-02-23 DIAGNOSIS — H04123 Dry eye syndrome of bilateral lacrimal glands: Secondary | ICD-10-CM | POA: Diagnosis not present

## 2022-02-23 DIAGNOSIS — N179 Acute kidney failure, unspecified: Secondary | ICD-10-CM | POA: Diagnosis present

## 2022-02-23 DIAGNOSIS — J3489 Other specified disorders of nose and nasal sinuses: Secondary | ICD-10-CM | POA: Diagnosis not present

## 2022-02-23 DIAGNOSIS — Z9049 Acquired absence of other specified parts of digestive tract: Secondary | ICD-10-CM | POA: Diagnosis not present

## 2022-02-23 DIAGNOSIS — I639 Cerebral infarction, unspecified: Secondary | ICD-10-CM | POA: Diagnosis not present

## 2022-02-23 DIAGNOSIS — G20 Parkinson's disease: Secondary | ICD-10-CM | POA: Diagnosis present

## 2022-02-23 DIAGNOSIS — N183 Chronic kidney disease, stage 3 unspecified: Secondary | ICD-10-CM

## 2022-02-23 DIAGNOSIS — E1122 Type 2 diabetes mellitus with diabetic chronic kidney disease: Secondary | ICD-10-CM

## 2022-02-23 DIAGNOSIS — J90 Pleural effusion, not elsewhere classified: Secondary | ICD-10-CM | POA: Diagnosis not present

## 2022-02-23 DIAGNOSIS — J690 Pneumonitis due to inhalation of food and vomit: Secondary | ICD-10-CM | POA: Diagnosis present

## 2022-02-23 DIAGNOSIS — K8681 Exocrine pancreatic insufficiency: Secondary | ICD-10-CM | POA: Diagnosis present

## 2022-02-23 DIAGNOSIS — N189 Chronic kidney disease, unspecified: Secondary | ICD-10-CM | POA: Diagnosis not present

## 2022-02-23 DIAGNOSIS — E860 Dehydration: Secondary | ICD-10-CM | POA: Diagnosis present

## 2022-02-23 DIAGNOSIS — D649 Anemia, unspecified: Secondary | ICD-10-CM | POA: Diagnosis not present

## 2022-02-23 DIAGNOSIS — I10 Essential (primary) hypertension: Secondary | ICD-10-CM

## 2022-02-23 DIAGNOSIS — K5651 Intestinal adhesions [bands], with partial obstruction: Secondary | ICD-10-CM | POA: Diagnosis present

## 2022-02-23 DIAGNOSIS — M4854XA Collapsed vertebra, not elsewhere classified, thoracic region, initial encounter for fracture: Secondary | ICD-10-CM | POA: Diagnosis present

## 2022-02-23 DIAGNOSIS — N400 Enlarged prostate without lower urinary tract symptoms: Secondary | ICD-10-CM | POA: Diagnosis present

## 2022-02-23 DIAGNOSIS — I129 Hypertensive chronic kidney disease with stage 1 through stage 4 chronic kidney disease, or unspecified chronic kidney disease: Secondary | ICD-10-CM | POA: Diagnosis present

## 2022-02-23 DIAGNOSIS — I6389 Other cerebral infarction: Secondary | ICD-10-CM

## 2022-02-23 DIAGNOSIS — L89150 Pressure ulcer of sacral region, unstageable: Secondary | ICD-10-CM | POA: Diagnosis present

## 2022-02-23 DIAGNOSIS — I63419 Cerebral infarction due to embolism of unspecified middle cerebral artery: Secondary | ICD-10-CM | POA: Diagnosis not present

## 2022-02-23 DIAGNOSIS — E039 Hypothyroidism, unspecified: Secondary | ICD-10-CM | POA: Diagnosis not present

## 2022-02-23 DIAGNOSIS — R911 Solitary pulmonary nodule: Secondary | ICD-10-CM | POA: Diagnosis not present

## 2022-02-23 LAB — LIPID PANEL
Cholesterol: 76 mg/dL (ref 0–200)
HDL: 31 mg/dL — ABNORMAL LOW (ref >40)
LDL Cholesterol: 34 mg/dL (ref 0–99)
Total CHOL/HDL Ratio: 2.5 RATIO
Triglycerides: 54 mg/dL (ref <150)
VLDL: 11 mg/dL (ref 0–40)

## 2022-02-23 LAB — BASIC METABOLIC PANEL
Anion gap: 9 (ref 5–15)
BUN: 93 mg/dL — ABNORMAL HIGH (ref 8–23)
CO2: 25 mmol/L (ref 22–32)
Calcium: 8.2 mg/dL — ABNORMAL LOW (ref 8.9–10.3)
Chloride: 107 mmol/L (ref 98–111)
Creatinine, Ser: 2.5 mg/dL — ABNORMAL HIGH (ref 0.61–1.24)
GFR, Estimated: 23 mL/min — ABNORMAL LOW (ref >60)
Glucose, Bld: 141 mg/dL — ABNORMAL HIGH (ref 70–99)
Potassium: 3.8 mmol/L (ref 3.5–5.1)
Sodium: 141 mmol/L (ref 135–145)

## 2022-02-23 LAB — CBC
HCT: 35.1 % — ABNORMAL LOW (ref 39.0–52.0)
Hemoglobin: 11.7 g/dL — ABNORMAL LOW (ref 13.0–17.0)
MCH: 32.4 pg (ref 26.0–34.0)
MCHC: 33.3 g/dL (ref 30.0–36.0)
MCV: 97.2 fL (ref 80.0–100.0)
Platelets: 159 10*3/uL (ref 150–400)
RBC: 3.61 MIL/uL — ABNORMAL LOW (ref 4.22–5.81)
RDW: 14.2 % (ref 11.5–15.5)
WBC: 4.6 10*3/uL (ref 4.0–10.5)
nRBC: 0 % (ref 0.0–0.2)

## 2022-02-23 LAB — ECHOCARDIOGRAM COMPLETE
Area-P 1/2: 2.47 cm2
Calc EF: 63.3 %
Height: 68 in
MV VTI: 2.41 cm2
S' Lateral: 3 cm
Single Plane A2C EF: 62.4 %
Single Plane A4C EF: 63.7 %
Weight: 2303.37 oz

## 2022-02-23 LAB — GLUCOSE, CAPILLARY
Glucose-Capillary: 105 mg/dL — ABNORMAL HIGH (ref 70–99)
Glucose-Capillary: 111 mg/dL — ABNORMAL HIGH (ref 70–99)
Glucose-Capillary: 121 mg/dL — ABNORMAL HIGH (ref 70–99)
Glucose-Capillary: 132 mg/dL — ABNORMAL HIGH (ref 70–99)
Glucose-Capillary: 136 mg/dL — ABNORMAL HIGH (ref 70–99)

## 2022-02-23 MED ORDER — ASPIRIN 81 MG PO CHEW
81.0000 mg | CHEWABLE_TABLET | Freq: Every day | ORAL | Status: DC
  Administered 2022-02-24 – 2022-03-09 (×14): 81 mg via ORAL
  Filled 2022-02-23 (×15): qty 1

## 2022-02-23 MED ORDER — ASPIRIN 300 MG RE SUPP
300.0000 mg | Freq: Once | RECTAL | Status: DC
  Filled 2022-02-23: qty 1

## 2022-02-23 MED ORDER — ASPIRIN 325 MG PO TABS
325.0000 mg | ORAL_TABLET | Freq: Once | ORAL | Status: AC
  Administered 2022-02-23: 325 mg via ORAL
  Filled 2022-02-23: qty 1

## 2022-02-23 MED ORDER — DIATRIZOATE MEGLUMINE & SODIUM 66-10 % PO SOLN
90.0000 mL | Freq: Once | ORAL | Status: AC
  Administered 2022-02-23: 90 mL via NASOGASTRIC
  Filled 2022-02-23: qty 90

## 2022-02-23 NOTE — Progress Notes (Signed)
SLP Cancellation Note  Patient Details Name: Steven Ward MRN: [id number] DOB: 01/19/1925   Cancelled treatment:       Reason Eval/Treat Not Completed: Patient at procedure or test/unavailable (Pt with RN at this time receiving care. SLP will follow up later as schedule allows.)  Naleigha Raimondi I. [name], [place], [place] Office number [phone] Pager [place] 02/23/2022, 9:36 AM

## 2022-02-23 NOTE — Progress Notes (Signed)
MD was informed that all oral med was held as pt is currently on NPO with no exception order. Contrast was given via tube and will repeat Xray at 07:30pm.

## 2022-02-23 NOTE — TOC Progression Note (Signed)
Transition of Care [facility]) - Progression Note    Patient Details  Name: Steven Ward MRN: [id number] Date of Birth: 15-Oct-1924  Transition of Care [facility]) CM/SW Contact  Zenon Mayo, RN Phone Number: 02/23/2022, 12:14 PM  Clinical Narrative:    from home with son, SBO, has NG tube in , await PT/OT eval. Has bed sore near rectum  per son.  Has walker, cane and recliner at home. Has aide with [employer], had shingle a week in a half ago per son.  TOC will continue to follow for dc needs.         Expected Discharge Plan and Services                                                 Social Determinants of Health (SDOH) Interventions    Readmission Risk Interventions     No data to display

## 2022-02-23 NOTE — Plan of Care (Signed)
  Problem: Coping: Goal: Ability to adjust to condition or change in health will improve Outcome: Progressing   Problem: Fluid Volume: Goal: Ability to maintain a balanced intake and output will improve Outcome: Progressing   Problem: Metabolic: Goal: Ability to maintain appropriate glucose levels will improve Outcome: Progressing   

## 2022-02-23 NOTE — Evaluation (Signed)
Occupational Therapy Evaluation Patient Details Name: Steven Ward MRN: [id number] DOB: [birth year] Today's Date: 02/23/2022   History of Present Illness The pt is a 87 yo male presenting 6/7 with weakness, fatigue, poor appetite, and fall while walking to the bathroom on day of admission. Pt found to be in afib with frequent PVCs (new after ablation 20 years ago). CT revealed mid/distal SBO, NG tube placed 6/7. MRI revealed R cerebellar infarct. PMH includes: DM II, HLD, PVD, PD, PAF, CKD III, colon cancer, and BPH.   Clinical Impression   Patient admitted for the diagnosis above.  PTA he lives with his son, who is able to assist as needed.  Deficits impacting independence are listed below.  Currently he is needing Mod A for basic transfers, and up to Max A for ADL completion from a sit/stand level.  OT to continue efforts in the acute setting, and AIR has been recommended for post acute rehab prior to returning home.          Recommendations for follow up therapy are one component of a multi-disciplinary discharge planning process, led by the attending physician.  Recommendations may be updated based on patient status, additional functional criteria and insurance authorization.   Follow Up Recommendations  Acute inpatient rehab (3hours/day)    Assistance Recommended at Discharge Frequent or constant Supervision/Assistance  Patient can return home with the following A lot of help with bathing/dressing/bathroom;Assistance with cooking/housework;Help with stairs or ramp for entrance;Assist for transportation;Direct supervision/assist for financial management;Direct supervision/assist for medications management;A lot of help with walking and/or transfers    Functional Status Assessment  Patient has had a recent decline in their functional status and demonstrates the ability to make significant improvements in function in a reasonable and predictable amount of time.  Equipment Recommendations   None recommended by OT    Recommendations for Other Services       Precautions / Restrictions Precautions Precautions: Fall Precaution Comments: pt reports 3-4 falls in last 6 months Restrictions Weight Bearing Restrictions: No      Mobility Bed Mobility   Bed Mobility: Sit to Supine       Sit to supine: Mod assist        Transfers Overall transfer level: Needs assistance Equipment used: Rolling walker (2 wheels) Transfers: Sit to/from Stand Sit to Stand: Mod assist                  Balance Overall balance assessment: Needs assistance Sitting-balance support: Bilateral upper extremity supported, Feet supported Sitting balance-Leahy Scale: Poor   Postural control: Posterior lean Standing balance support: Reliant on assistive device for balance Standing balance-Leahy Scale: Poor                             ADL either performed or assessed with clinical judgement   ADL Overall ADL's : Needs assistance/impaired Eating/Feeding: NPO   Grooming: Wash/dry hands;Set up;Sitting   Upper Body Bathing: Minimal assistance;Sitting   Lower Body Bathing: Maximal assistance;Sitting/lateral leans   Upper Body Dressing : Moderate assistance;Sitting   Lower Body Dressing: Maximal assistance;Sitting/lateral leans   Toilet Transfer: Maximal assistance;BSC/3in1;Stand-pivot                   Vision Patient Visual Report: No change from baseline       Perception Perception Perception: Not tested   Praxis Praxis Praxis: Not tested    Pertinent Vitals/Pain Pain Assessment Pain Assessment: No/denies pain  Hand Dominance Right   Extremity/Trunk Assessment Upper Extremity Assessment Upper Extremity Assessment: Generalized weakness   Lower Extremity Assessment Lower Extremity Assessment: Defer to PT evaluation RLE Deficits / Details: grossly 3/5, increased time to achieve test position, losing balance in sitting with movement of LE RLE  Sensation:  (pt unable to clearly answer) RLE Coordination: decreased fine motor;decreased gross motor LLE Deficits / Details: grossly 4-/5, losing balance with LE movements. LLE Coordination: decreased fine motor;decreased gross motor   Cervical / Trunk Assessment Cervical / Trunk Assessment: Kyphotic   Communication Communication Communication: Expressive difficulties   Cognition Arousal/Alertness: Awake/alert Behavior During Therapy: Flat affect Overall Cognitive Status: No family/caregiver present to determine baseline cognitive functioning                                       General Comments  Watch BP    Exercises     Shoulder Instructions      Home Living Family/patient expects to be discharged to:: Private residence Living Arrangements: Children Available Help at Discharge: Family;Available 24 hours/day Type of Home: House Home Access: Stairs to enter [employer] of Steps: 3   Home Layout: Two level Alternate Level Stairs-Number of Steps: 13 Alternate Level Stairs-Rails: Right;Left Bathroom Shower/Tub: Occupational psychologist: Standard Bathroom Accessibility: Yes How Accessible: Accessible via walker Home Equipment: Rollator (4 wheels);Cane - quad          Prior Functioning/Environment Prior Level of Function : Needs assist;History of Falls (last six months)       Physical Assist : Mobility (physical);ADLs (physical) Mobility (physical): Gait;Stairs ADLs (physical): Bathing;IADLs Mobility Comments: Per PT eval: pt states he ambulates with cane, has had 3-4 falls in last 6 months. states his son assists with stairs and is close for walking. no family present to confirm ADLs Comments: pt states assist with IADLs and bathing. Son assist his medications and community mobility.        OT Problem List: Decreased strength;Decreased activity tolerance;Impaired balance (sitting and/or standing);Decreased safety awareness       OT Treatment/Interventions: Self-care/ADL training;Therapeutic activities;Patient/family education;Balance training;DME and/or AE instruction    OT Goals(Current goals can be found in the care plan section) Acute Rehab OT Goals OT Goal Formulation: Patient unable to participate in goal setting Time For Goal Achievement: 03/09/22 Potential to Achieve Goals: Fair ADL Goals Pt Will Perform Grooming: standing;with min guard assist Pt Will Perform Lower Body Dressing: with min assist;sit to/from stand Pt Will Transfer to Toilet: with min guard assist;ambulating;regular height toilet  OT Frequency: Min 2X/week    Co-evaluation              AM-PAC OT "6 Clicks" Daily Activity     Outcome Measure Help from another person eating meals?: Total Help from another person taking care of personal grooming?: A Little Help from another person toileting, which includes using toliet, bedpan, or urinal?: A Lot Help from another person bathing (including washing, rinsing, drying)?: A Lot Help from another person to put on and taking off regular upper body clothing?: A Lot Help from another person to put on and taking off regular lower body clothing?: A Lot 6 Click Score: 12   End of Session Equipment Utilized During Treatment: Gait belt;Rolling walker (2 wheels) Nurse Communication: Mobility status  Activity Tolerance: Patient tolerated treatment well Patient left: in chair;with call bell/phone within reach;with chair alarm set;with nursing/sitter  in room  OT Visit Diagnosis: Unsteadiness on feet (R26.81);Muscle weakness (generalized) (M62.81);Ataxia, unspecified (R27.0)                Time: 7981-0254 OT Time Calculation (min): 18 min Charges:  OT General Charges $OT Visit: 1 Visit OT Evaluation $OT Eval Moderate Complexity: 1 Mod  02/23/2022  Steven Ward, Steven Ward  Acute Rehabilitation Services  Office:  [phone]   Metta Clines 02/23/2022, 2:31 PM

## 2022-02-23 NOTE — Progress Notes (Signed)
RN was informed by MD via chat that pt can have his medication taken orally with sip of water and ice chips can be offered.

## 2022-02-23 NOTE — Evaluation (Signed)
Physical Therapy Evaluation Patient Details Name: Steven Ward MRN: [id number] DOB: 1924-10-27 Today's Date: 02/23/2022  History of Present Illness  The pt is a 88 yo male presenting 6/7 with weakness, fatigue, poor appetite, and fall while walking to the bathroom on day of admission. Pt found to be in afib with frequent PVCs (new after ablation 20 years ago). CT revealed mid/distal SBO, NG tube placed 6/7. MRI revealed R cerebellar infarct. PMH includes: DM II, HLD, PVD, PD, PAF, CKD III, colon cancer, and BPH.   Clinical Impression  Pt in bed upon arrival of PT, agreeable to evaluation at this time. Prior to admission the pt was mobilizing with use of cane in the home, lives with his son who assists as needed with stairs and IADLs. The pt now presents with limitations in functional mobility, strength, power, coordination, dynamic stability, and activity tolerance due to above dx, and will continue to benefit from skilled PT to address these deficits. The pt required modA to complete all bed mobility, and up to modA to maintain static sitting due to L lateral lean and posterior lean, needing mod verbal cues to correct and at times min-mod physical assist. The pt was able to complete sit-stand transfer to pivot to recliner, presents with strong posterior lean needing min-modA to correct. Mobility further limited by soft BP (see below) but pt denies sx throughout. Given deficits and prior level of mobility, will benefit from acute inpatient rehab at d/c to facilitate maximal functional recovery and independence.   VITALS:  - supine in bed- BP: 105/66 (78);  - sitting in recliner after transfer - BP: 83/49 (59); - sitting in recliner after 2 min - BP: 93/60 (69); - standing - BP: 91/26 (51); - sitting in recliner - 93/58 (68) *pt denies sx with all movements*      Recommendations for follow up therapy are one component of a multi-disciplinary discharge planning process, led by the attending  physician.  Recommendations may be updated based on patient status, additional functional criteria and insurance authorization.  Follow Up Recommendations Acute inpatient rehab (3hours/day)    Assistance Recommended at Discharge Frequent or constant Supervision/Assistance  Patient can return home with the following  Two people to help with walking and/or transfers;A lot of help with bathing/dressing/bathroom;Assistance with cooking/housework;Direct supervision/assist for medications management;Direct supervision/assist for financial management;Assist for transportation;Help with stairs or ramp for entrance    Equipment Recommendations  (defer to post acute)  Recommendations for Other Services  Rehab consult    Functional Status Assessment Patient has had a recent decline in their functional status and demonstrates the ability to make significant improvements in function in a reasonable and predictable amount of time.     Precautions / Restrictions Precautions Precautions: Fall Precaution Comments: pt reports 3-4 falls in last 6 months Restrictions Weight Bearing Restrictions: No      Mobility  Bed Mobility Overal bed mobility: Needs Assistance Bed Mobility: Supine to Sit     Supine to sit: Mod assist     General bed mobility comments: modA to manage LE and to elevate trunk, pt falling to back and L with sitting    Transfers Overall transfer level: Needs assistance Equipment used: Rolling walker (2 wheels) Transfers: Sit to/from Stand, Bed to chair/wheelchair/BSC Sit to Stand: Mod assist   Step pivot transfers: Min assist       General transfer comment: modA to rise with pt leaning posteriorly and with LE slipping forwards. Pt with poor wt shift forwards  or hip flexion. ModA to boost to standing. minA once in standing to take small shuffling steps to recliner, limited by soft BP    Ambulation/Gait             Pre-gait activities: standing marches, minimal  clearance, strong posterior lean General Gait Details: limited to small lateral shuffling steps, minimal clearance even with verbal cues. no buckling, minA to maintain upright and direct.   Modified Rankin (Stroke Patients Only) Modified Rankin (Stroke Patients Only) Pre-Morbid Rankin Score: Moderately severe disability Modified Rankin: Moderately severe disability     Balance Overall balance assessment: Needs assistance Sitting-balance support: Bilateral upper extremity supported, Feet supported Sitting balance-Leahy Scale: Poor Sitting balance - Comments: falling to L and posteriorly with movement of LE or without external support Postural control: Left lateral lean, Posterior lean Standing balance support: Bilateral upper extremity supported, During functional activity Standing balance-Leahy Scale: Poor Standing balance comment: dependent on BUE support and assist, up to modA due to posterior lean                             Pertinent Vitals/Pain Pain Assessment Pain Assessment: No/denies pain    Home Living Family/patient expects to be discharged to:: Private residence Living Arrangements: Children Available Help at Discharge: Family;Available 24 hours/day Type of Home: House Home Access: Stairs to enter   Entrance Stairs-Number of Steps: 3 Alternate Level Stairs-Number of Steps: 13 Home Layout: Two level Home Equipment: Rollator (4 wheels);Cane - quad      Prior Function Prior Level of Function : Needs assist;History of Falls (last six months)       Physical Assist : Mobility (physical);ADLs (physical) Mobility (physical): Gait;Stairs ADLs (physical): Bathing;IADLs Mobility Comments: pt states he ambulates with cane, has had 3-4 falls in last 6 months. states his son assists with stairs and is close for walking. no family present to confirm ADLs Comments: pt states assist with IADLs and bathing. no other assist needed. no family present to confirm      Hand Dominance   Dominant Hand: Right    Extremity/Trunk Assessment   Upper Extremity Assessment Upper Extremity Assessment: Generalized weakness    Lower Extremity Assessment Lower Extremity Assessment: Generalized weakness;RLE deficits/detail;LLE deficits/detail RLE Deficits / Details: grossly 3/5, increased time to achieve test position, losing balance in sitting with movement of LE RLE Sensation:  (pt unable to clearly answer) RLE Coordination: decreased fine motor;decreased gross motor LLE Deficits / Details: grossly 4-/5, losing balance with LE movements. LLE Coordination: decreased fine motor;decreased gross motor    Cervical / Trunk Assessment Cervical / Trunk Assessment: Kyphotic  Communication   Communication: Expressive difficulties (mumbled and garbled at times)  Cognition Arousal/Alertness: Lethargic (initially, improved alertness with moblity) Behavior During Therapy: Flat affect Overall Cognitive Status: No family/caregiver present to determine baseline cognitive functioning Area of Impairment: Following commands, Safety/judgement, Awareness, Problem solving                       Following Commands: Follows one step commands inconsistently, Follows one step commands with increased time Safety/Judgement: Decreased awareness of safety, Decreased awareness of deficits Awareness: Intellectual Problem Solving: Slow processing, Decreased initiation, Difficulty sequencing, Requires verbal cues General Comments: pt needing cues and increased time to complete all movements. poor awareness of postural deficits, needing multimodal cues to correct        General Comments General comments (skin integrity, edema, etc.): BP dropping with change  in position        Assessment/Plan    PT Assessment Patient needs continued PT services  PT Problem List Decreased strength;Decreased range of motion;Decreased activity tolerance;Decreased balance;Decreased  mobility;Decreased coordination;Decreased knowledge of use of DME;Decreased cognition;Decreased safety awareness;Decreased knowledge of precautions       PT Treatment Interventions DME instruction;Gait training;Stair training;Functional mobility training;Therapeutic activities;Therapeutic exercise;Balance training;Patient/family education    PT Goals (Current goals can be found in the Care Plan section)  Acute Rehab PT Goals Patient Stated Goal: none stated PT Goal Formulation: Patient unable to participate in goal setting Time For Goal Achievement: 03/09/22 Potential to Achieve Goals: Good    Frequency Min 4X/week        AM-PAC PT "6 Clicks" Mobility  Outcome Measure Help needed turning from your back to your side while in a flat bed without using bedrails?: A Lot Help needed moving from lying on your back to sitting on the side of a flat bed without using bedrails?: A Lot Help needed moving to and from a bed to a chair (including a wheelchair)?: A Lot Help needed standing up from a chair using your arms (e.g., wheelchair or bedside chair)?: A Lot Help needed to walk in hospital room?: Total (unable to walk 20 ft) Help needed climbing 3-5 steps with a railing? : Total 6 Click Score: 10    End of Session Equipment Utilized During Treatment: Gait belt Activity Tolerance: Treatment limited secondary to medical complications (Comment) (soft BP) Patient left: in chair;with call bell/phone within reach;with chair alarm set Nurse Communication: Mobility status PT Visit Diagnosis: Other abnormalities of gait and mobility (R26.89);Muscle weakness (generalized) (M62.81);History of falling (Z91.81)    Time: 5035-4656 PT Time Calculation (min) (ACUTE ONLY): 40 min   Charges:   PT Evaluation $PT Eval Moderate Complexity: 1 Mod PT Treatments $Therapeutic Exercise: 8-22 mins        West Carbo, PT, DPT   Acute Rehabilitation Department  Sandra Cockayne 02/23/2022, 1:41 PM

## 2022-02-23 NOTE — Progress Notes (Signed)
  Echocardiogram 2D Echocardiogram has been performed.  Steven Ward 02/23/2022, 11:02 AM

## 2022-02-23 NOTE — Progress Notes (Signed)
NG tube placed per order. NG tube placement confirmed by chest x-ray. Surgeon, Dr. Zenia Resides  paged; confirmed low intermittent suction can begin .   16 Fr NG tube placed to 30" mark from distal end.

## 2022-02-23 NOTE — Progress Notes (Signed)
Mobility Specialist Progress Note:   02/23/22 1508  Mobility  Activity Transferred from chair to bed  Level of Assistance Moderate assist, patient does 50-74%  Assistive Device  (HHA)  Distance Ambulated (ft) 2 ft  Activity Response Tolerated well  $Mobility charge 1 Mobility   Pt received needing to get back to bed. No complaints of pain. ModA to stand and step to bed. Left in bed with call bell in reach and all needs met.   [facility] Moria Brophy Mobility Specialist

## 2022-02-23 NOTE — Care Management Obs Status (Signed)
[place] NOTIFICATION   Patient Details  Name: Steven Ward MRN: [id number] Date of Birth: Sep 25, 1924   Medicare Observation Status Notification Given:  Yes    Verdell Carmine, RN 02/23/2022, 11:16 AM

## 2022-02-23 NOTE — Consult Note (Signed)
Cardiology Consultation:   Patient ID: MCCARTNEY CHUBA MRN: [id number]; DOB: 08-Oct-1924  Admit date: 02/22/2022 Date of Consult: 02/23/2022  PCP:  [name], MD   [facility] Providers Cardiologist:  None        Patient Profile:   MAZIAH KEELING is a 88 y.o. male with a hx of paroxysmal A-fib not on anticoagulation, hypertension, hypothyroidism, Parkinson's disease, lumbar spondylosis, type 2 diabetes, hyperlipidemia, PVD, CKD stage IIIb, remote history of colon cancer, BPH, pancreatic insufficiency, gallstones, GERD who is being seen 02/23/2022 for the evaluation of afib, pvcs at the request of Dr Marlowe Sax.  History of Present Illness:   ohn P Mcneel is a 88 y.o. male with a hx of paroxysmal A-fib not on anticoagulation, hypertension, hypothyroidism, Parkinson's disease, lumbar spondylosis, type 2 diabetes, hyperlipidemia, PVD, CKD stage IIIb, remote history of colon cancer, BPH, pancreatic insufficiency, gallstones, GERD who is being seen 02/23/2022 for the evaluation of afib, pvcs.  Per reports Patient was seen by PCP yesterday for complaints of generalized weakness, nausea, and poor oral intake. He then presented to the ER with c/o generalized weakness work up showed: EKG rate controlled afib and PVCs, ER physician spoke to Dr Tamala Julian who recommended starting anticoagulation, and getting an ECHO as well as admit to hospitalist service.  Patient is being admitted for n/v- small bowel obstruction, has NG tube in place and cards consulted for Afib and PVC mx. Per discussion with hospitalist and patient, due to risk of falls- he is not a candidate for anticoagulation.  EKG: afib with PAC/pvc Repeat EKG shows afib with ventricular bigeminy  MRI brain shows: subacute infarct- right frontal lobe, punctate right cerebellar infarct which are acute Ct abd/pelvis shows small bowel obstruction  Past Medical History:  Diagnosis Date   ABNORMAL THYROID FUNCTION TESTS 01/17/2010    ALLERGIC RHINITIS 12/01/2008   Colon cancer ([place])    COLONIC POLYPS, HX OF 12/01/2008   DIABETES MELLITUS, TYPE II 12/01/2008   EDEMA LEG 05/04/2009   ESSENTIAL HYPERTENSION 12/01/2008   Exocrine pancreatic insufficiency    GALLSTONES 04/07/2009   GERD 12/01/2008   Hay fever    SPONDYLOSIS, LUMBAR 12/01/2008    Past Surgical History:  Procedure Laterality Date   ANKLE SURGERY Right    CATARACT EXTRACTION     CHOLECYSTECTOMY     COLON SURGERY     resection 1990 for cancer   LEG SURGERY Left      Home Medications:  Prior to Admission medications   Medication Sig Start Date End Date Taking? Authorizing Provider  Carbidopa-Levodopa ER (SINEMET CR) 25-100 MG tablet controlled release TAKE 2 TABLETS BY MOUTH IN THE MORNING AND 2 IN THE AFTERNOON AND 1 IN THE EVENING Patient taking differently: 1-2 tablets See admin instructions. TAKE 2 TABLETS BY MOUTH IN THE MORNING AND 2 IN THE AFTERNOON AND 1 IN THE EVENING 10/28/21  Yes Burchette, Alinda Sierras, MD  furosemide (LASIX) 20 MG tablet Take one tablet by mouth once daily as needed for edema/swelling. Patient taking differently: Take 20 mg by mouth daily. 05/10/21  Yes Burchette, Alinda Sierras, MD  glipiZIDE (GLUCOTROL) 5 MG tablet Take one half tablet by mouth once daily. Patient taking differently: Take 5 mg by mouth daily before breakfast. 02/08/22  Yes Burchette, Alinda Sierras, MD  hydrochlorothiazide (MICROZIDE) 12.5 MG capsule TAKE 1 CAPSULE BY MOUTH EVERY DAY Patient taking differently: Take 12.5 mg by mouth daily. 01/02/22  Yes Burchette, Alinda Sierras, MD  levothyroxine (SYNTHROID) 25 MCG tablet  Take 1 tablet (25 mcg total) by mouth daily. 11/10/21  Yes Burchette, Alinda Sierras, MD  lipase/protease/amylase (CREON) 12000-38000 units CPEP capsule Take 12,000 Units by mouth 3 (three) times daily before meals.   Yes [provider]  ondansetron (ZOFRAN-ODT) 4 MG disintegrating tablet Take 1 tablet (4 mg total) by mouth every 8 (eight) hours as needed for nausea or  vomiting. 02/21/22  Yes Burchette, Alinda Sierras, MD  polyethylene glycol (MIRALAX / GLYCOLAX) packet Take 17 g by mouth daily as needed for moderate constipation.   Yes [provider]  tamsulosin (FLOMAX) 0.4 MG CAPS capsule TAKE 1 CAPSULE BY MOUTH EVERY DAY Patient taking differently: 0.4 mg daily. 01/26/22  Yes Burchette, Alinda Sierras, MD  triamcinolone cream (KENALOG) 0.1 % APPLY  CREAM EXTERNALLY TO AFFECTED AREA TWICE DAILY AS NEEDED Patient taking differently: 1 application. daily as needed (itching). 11/28/19  Yes Burchette, Alinda Sierras, MD  blood glucose meter kit and supplies KIT Dispense based on patient and insurance preference. Use up to four times daily as directed. (FOR ICD-9 250.00, 250.01). 10/03/17   Burchette, Alinda Sierras, MD  Blood Glucose Monitoring Suppl (ONE TOUCH ULTRA 2) w/Device KIT Use as directed. 05/10/21   Burchette, Alinda Sierras, MD  glucose blood (ONETOUCH ULTRA) test strip USE TO TEST BLOOD SUGAR 3 TIMES A DAY 01/25/22   Burchette, Alinda Sierras, MD  Lancet Device MISC Use 1-4 times daily as needed or directed.  DX E11.9 11/01/20   Burchette, Alinda Sierras, MD  Lancets [facility] ULTRASOFT) lancets Check 3 times daily. E11.9 11/30/20   Burchette, Alinda Sierras, MD    Inpatient Medications: Scheduled Meds:   stroke: early stages of recovery book   Does not apply Once   Carbidopa-Levodopa ER  1 tablet Oral QPM   Carbidopa-Levodopa ER  2 tablet Oral BID   insulin aspart  0-5 Units Subcutaneous QHS   insulin aspart  0-9 Units Subcutaneous TID WC   levothyroxine  25 mcg Oral Daily   lipase/protease/amylase  12,000 Units Oral TID AC   metoprolol tartrate  12.5 mg Oral BID   tamsulosin  0.4 mg Oral Daily   Continuous Infusions:  sodium chloride Stopped (02/22/22 1532)   cefTRIAXone (ROCEPHIN)  IV 2 g (02/23/22 0250)   metronidazole 500 mg (02/23/22 0328)   PRN Meds: acetaminophen **OR** acetaminophen, ondansetron  Allergies:    Allergies  Allergen Reactions   Amoxicillin     REACTION: rash,  dizziness    Social History:   Social History   Socioeconomic History   Marital status: Widowed    Spouse name: Not on file   Number of children: 2   Years of education: Not on file   Highest education level: Not on file  Occupational History   Occupation: retired    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  Tobacco Use   Smoking status: Former    Packs/day: 1.00    Years: 20.00    Total pack years: 20.00    Types: Cigarettes    Quit date: 12/28/1968    Years since quitting: 53.1   Smokeless tobacco: Never  Vaping Use   Vaping Use: Never used  Substance and Sexual Activity   Alcohol use: No   Drug use: No   Sexual activity: Not on file  Other Topics Concern   Not on file  Social History Narrative   R handed   Lives with son    Social Determinants of Health   Financial  Resource Strain: Low Risk  (05/25/2021)   Overall Financial Resource Strain (CARDIA)    Difficulty of Paying Living Expenses: Not hard at all  Food Insecurity: No Food Insecurity (05/25/2021)   Hunger Vital Sign    Worried About Running Out of Food in the Last Year: Never true    Ran Out of Food in the Last Year: Never true  Transportation Needs: No Transportation Needs (05/25/2021)   PRAPARE - Hydrologist (Medical): No    Lack of Transportation (Non-Medical): No  Physical Activity: Inactive (05/25/2021)   Exercise Vital Sign    Days of Exercise per Week: 0 days    Minutes of Exercise per Session: 0 min  Stress: No Stress Concern Present (05/25/2021)   Emma    Feeling of Stress : Not at all  Social Connections: Socially Isolated (05/25/2021)   Social Connection and Isolation Panel [NHANES]    Frequency of Communication with Friends and Family: Never    Frequency of Social Gatherings with Friends and Family: Once a week    Attends Religious Services: Never    Marine scientist or  Organizations: No    Attends Archivist Meetings: Never    Marital Status: Widowed  Intimate Partner Violence: Not At Risk (05/25/2021)   Humiliation, Afraid, Rape, and Kick questionnaire    Fear of Current or Ex-Partner: No    Emotionally Abused: No    Physically Abused: No    Sexually Abused: No    Family History:    Family History  Problem Relation Age of Onset   Cancer Other        colon   Diabetes Other    Stroke Other    Cancer Mother      ROS:  Please see the history of present illness.   All other ROS reviewed and negative.     Physical Exam/Data:   Vitals:   02/22/22 1840 02/22/22 2000 02/22/22 2241 02/23/22 0409  BP: 99/65 113/61 117/70 (!) 98/48  Pulse: 84 85 87 77  Resp: $Remo'16 18 18 20  'vvEVb$ Temp: 98.2 F (36.8 C) 98.2 F (36.8 C) 98.3 F (36.8 C) 98.3 F (36.8 C)  TempSrc: Oral Oral Oral Oral  SpO2: 97% 97% 97% 97%  Weight:  65.3 kg    Height:  $Remove'5\' 8"'CQRbTrA$  (1.727 m)      Intake/Output Summary (Last 24 hours) at 02/23/2022 0554 Last data filed at 02/23/2022 0428 Gross per 24 hour  Intake 1879.85 ml  Output --  Net 1879.85 ml      02/22/2022    8:00 PM 02/22/2022    8:30 AM 02/21/2022    8:38 AM  Last 3 Weights  Weight (lbs) 143 lb 15.4 oz 151 lb 0.2 oz 157 lb 3.2 oz  Weight (kg) 65.3 kg 68.5 kg 71.305 kg     Body mass index is 21.89 kg/m.  General:  Well nourished, well developed, in no acute distress HEENT: normal Neck: no JVD Vascular: No carotid bruits; Distal pulses 2+ bilaterally Cardiac:  irregularly irregular rhythm Lungs:  clear to auscultation bilaterally, no wheezing, rhonchi or rales  Abd: soft, nontender, no hepatomegaly  Ext: no edema Musculoskeletal:  No deformities, BUE and BLE strength normal and equal Skin: warm and dry  Neuro:  CNs 2-12 intact, no focal abnormalities noted Psych:  Normal affect   EKG:  The EKG was personally reviewed and demonstrates:   Telemetry:  Telemetry was personally reviewed and demonstrates:     Relevant CV Studies: ECHO 2012 Study Conclusions   - Left ventricle: The cavity size was normal. There was mild    focal basal and mild concentric hypertrophy of the septum.    Systolic function was normal. The estimated ejection    fraction was in the range of 55% to 60%. Wall motion was    normal; there were no regional wall motion abnormalities.    Doppler parameters are consistent with abnormal left    ventricular relaxation (grade 1 diastolic dysfunction).    Doppler parameters are consistent with high ventricular    filling pressure.  - Aortic valve: The left coronary cusp appears fused.  - Left atrium: The atrium was moderately dilated.   Laboratory Data:  High Sensitivity Troponin:   Recent Labs  Lab 02/22/22 0847  TROPONINIHS 13     Chemistry Recent Labs  Lab 02/22/22 0847 02/22/22 2032 02/23/22 0305  NA 140  --  141  K 4.0  --  3.8  CL 101  --  107  CO2 26  --  25  GLUCOSE 189*  --  141*  BUN 100*  --  93*  CREATININE 2.47*  --  2.50*  CALCIUM 9.3  --  8.2*  MG  --  2.0  --   GFRNONAA 23*  --  23*  ANIONGAP 13  --  9    Recent Labs  Lab 02/22/22 0847  PROT 6.8  ALBUMIN 3.4*  AST 18  ALT 18  ALKPHOS 55  BILITOT 1.2   Lipids  Recent Labs  Lab 02/23/22 0305  CHOL 76  TRIG 54  HDL 31*  LDLCALC 34  CHOLHDL 2.5    Hematology Recent Labs  Lab 02/22/22 0847 02/23/22 0305  WBC 3.6* 4.6  RBC 3.87* 3.61*  HGB 12.4* 11.7*  HCT 36.8* 35.1*  MCV 95.1 97.2  MCH 32.0 32.4  MCHC 33.7 33.3  RDW 14.3 14.2  PLT 166 159   Thyroid  Recent Labs  Lab 02/22/22 2032  TSH 2.173    BNPNo results for input(s): "BNP", "PROBNP" in the last 168 hours.  DDimer No results for input(s): "DDIMER" in the last 168 hours.   Radiology/Studies:  DG CHEST PORT 1 VIEW  Result Date: 02/23/2022 CLINICAL DATA:  Check gastric catheter placement EXAM: PORTABLE CHEST 1 VIEW COMPARISON:  Abdomen film from earlier in the same day. FINDINGS: Gastric catheter now  extends mildly into the stomach. No looping is noted within the esophagus. Cardiac shadow is within normal limits. Calcified granulomas are noted bilaterally. IMPRESSION: Gastric catheter now noted within the stomach. No looping in the esophagus is seen. Electronically Signed   By: Inez Catalina M.D.   On: 02/23/2022 03:21   DG Abd 1 View  Result Date: 02/23/2022 CLINICAL DATA:  Check gastric catheter placement EXAM: ABDOMEN - 1 VIEW COMPARISON:  Film from earlier in the same day FINDINGS: Gastric catheter is stable in appearance when compared with the prior exam it should be advanced deeper into the stomach. If significant advancement has been performed, chest x-ray is recommended to evaluate for possible looping within the proximal esophagus. IMPRESSION: Gastric catheter is stable from the prior exam. Chest x-ray may be helpful as described above. Electronically Signed   By: Inez Catalina M.D.   On: 02/23/2022 02:34   DG Abd 1 View  Result Date: 02/23/2022 CLINICAL DATA:  Check gastric catheter placement EXAM: ABDOMEN - 1 VIEW COMPARISON:  None Available. FINDINGS: Scattered large and small bowel gas is noted. Gastric catheter is noted with the tip at the expected level of the gastroesophageal junction. This should be advanced deeper into the stomach. IMPRESSION: Gastric catheter at the expected location of the gastroesophageal junction. This should be advanced deeper into the stomach. Electronically Signed   By: Inez Catalina M.D.   On: 02/23/2022 00:31   MR BRAIN WO CONTRAST  Result Date: 02/22/2022 CLINICAL DATA:  Acute neurologic deficit EXAM: MRI HEAD WITHOUT CONTRAST TECHNIQUE: Multiplanar, multiecho pulse sequences of the brain and surrounding structures were obtained without intravenous contrast. COMPARISON:  None Available. FINDINGS: Brain: Punctate acute infarct within the right cerebellar hemisphere. There is mildly hyperintense signal on diffusion-weighted imaging in subcortical right frontal  lobe without a clear ADC correlate. No acute or chronic hemorrhage. There is multifocal hyperintense T2-weighted signal within the white matter. Generalized cerebral volume loss. The midline structures are normal. Vascular: Major flow voids are preserved. Skull and upper cervical spine: Normal calvarium and skull base. Visualized upper cervical spine and soft tissues are normal. Sinuses/Orbits:Right mastoid effusion. Paranasal sinuses are clear. Normal orbits. IMPRESSION: 1. Punctate acute infarct within the right cerebellar hemisphere. No hemorrhage or mass effect. 2. Mildly hyperintense signal on diffusion-weighted imaging in the subcortical right frontal lobe without a clear ADC correlate. This may indicate a subacute infarct. 3. Chronic small vessel ischemic disease and cerebral volume loss. Electronically Signed   By: Ulyses Jarred M.D.   On: 02/22/2022 21:46   CT CHEST ABDOMEN PELVIS WO CONTRAST  Result Date: 02/22/2022 CLINICAL DATA:  Inpatient. Abdominal distension. Poor p.o. intake. Generalized weakness. Or abnormal chest radiograph with possible pulmonary nodule. Remote history of resected left colon cancer in 1993 per prior report. * Tracking Code: BO * EXAM: CT CHEST, ABDOMEN AND PELVIS WITHOUT CONTRAST TECHNIQUE: Multidetector CT imaging of the chest, abdomen and pelvis was performed following the standard protocol without IV contrast. RADIATION DOSE REDUCTION: This exam was performed according to the departmental dose-optimization program which includes automated exposure control, adjustment of the mA and/or kV according to patient size and/or use of iterative reconstruction technique. COMPARISON:  Chest radiograph from earlier today. 06/30/2009 CT abdomen/pelvis. FINDINGS: CT CHEST FINDINGS Cardiovascular: Normal heart size. No significant pericardial effusion/thickening. Three-vessel coronary atherosclerosis. Atherosclerotic nonaneurysmal thoracic aorta. Normal caliber pulmonary arteries.  Mediastinum/Nodes: No discrete thyroid nodules. Unremarkable esophagus. No pathologically enlarged axillary, mediastinal or hilar lymph nodes, noting limited sensitivity for the detection of hilar adenopathy on this noncontrast study. Lungs/Pleura: No pneumothorax. Scattered calcified bilateral pleural plaques anteriorly and posteriorly. Trace bilateral pleural effusions, right greater than left. Ovoid 3.5 x 2.4 cm subpleural focus of consolidation in the dependent basilar right lower lobe (series 5/image 103), new from 2010 CT. Patchy confluent subpleural reticulation and ground-glass opacity with associated mild traction bronchiolectasis at both lung bases, mildly increased from prior CT. Mild-to-moderate patchy tree-in-bud opacities in the dependent basilar right upper lobe. No additional significant pulmonary nodules. Musculoskeletal: No aggressive appearing focal osseous lesions. Healed anterolateral right seventh through tenth rib fractures. Moderate T5 vertebral compression fracture of indeterminate chronicity and chronic severe T12 vertebral compression fracture. Mild thoracic spondylosis. CT ABDOMEN PELVIS FINDINGS Hepatobiliary: Normal liver with no liver mass. Cholecystectomy. Bile ducts are within normal post cholecystectomy limits with CBD diameter 7 mm. Pancreas: Progressive coarse calcifications throughout the atrophic pancreas with irregular dilated main pancreatic duct up to 7 mm diameter, compatible with chronic pancreatitis. No discrete pancreatic mass. No peripancreatic fluid collections. Spleen: Normal  size. No mass. Adrenals/Urinary Tract: Normal adrenals. No renal stones. No hydronephrosis. Simple 1.2 cm posterior upper left renal cyst for which no follow-up is recommended. No additional contour deforming renal masses. Normal bladder. Stomach/Bowel: Mildly distended stomach with air-fluid level. No gastric wall thickening. Diffuse dilatation of the proximal to mid small bowel with air-fluid  levels, measuring up to 4.8 cm diameter. Distal small bowel is collapsed. Apparent focal small bowel caliber transition in the right abdomen (series 6/image 38). There is associated fat stranding and ill-defined fluid throughout the small bowel mesentery. There is some mesenteric twisting at the site of the caliber transition. No definite bowel wall thickening or pneumatosis. Normal appendix. Postsurgical change from partial distal colectomy with intact appearing colorectal anastomosis. No large bowel wall thickening, diverticulosis or significant pericolonic fat stranding. Vascular/Lymphatic: Atherosclerotic nonaneurysmal abdominal aorta. No pathologically enlarged lymph nodes in the abdomen or pelvis. Reproductive: Normal size prostate. No pneumoperitoneum. Small volume ascites. No focal fluid collection. Other: No pneumoperitoneum, ascites or focal fluid collection. Surgical clips noted throughout the midline ventral abdominal wall. Musculoskeletal: No aggressive appearing focal osseous lesions. Marked lumbar spondylosis. IMPRESSION: 1. Mechanical mid to distal small-bowel obstruction with apparent focal small bowel caliber transition in the right abdomen, with associated fat stranding and ill-defined fluid throughout the small bowel mesentery and with some mesenteric twisting, raising the possibility of internal hernia. No definite bowel wall thickening. No pneumatosis. No free air. 2. Small volume ascites. 3. Bilateral calcified pleural plaques and trace bilateral pleural effusions, compatible with asbestos related pleural disease. Basilar predominant pulmonary fibrosis compatible with asbestosis. 4. Ovoid 3.5 cm subpleural focus of consolidation in the dependent basilar right lower lobe, new from 2010 CT, indeterminate for rounded atelectasis versus pulmonary neoplasm. Suggest attention on follow-up chest CT in 3 months. 5. Mild-to-moderate patchy tree-in-bud opacities in the dependent basilar right upper  lobe, compatible with nonspecific infectious or inflammatory bronchiolitis, with the differential including aspiration. 6. Chronic pancreatitis. 7. Three-vessel coronary atherosclerosis. 8. Moderate T5 vertebral compression fracture of indeterminate chronicity and chronic severe T12 vertebral compression fracture. 9. Aortic Atherosclerosis (ICD10-I70.0). These results will be called to the ordering clinician or representative by the Radiologist Assistant, and communication documented in the PACS or [employer]. Electronically Signed   By: Ilona Sorrel M.D.   On: 02/22/2022 21:39   DG Chest Port 1 View  Result Date: 02/22/2022 CLINICAL DATA:  Shortness of breath.  Weakness and fatigue. EXAM: PORTABLE CHEST 1 VIEW COMPARISON:  06/27/2011 FINDINGS: Suspected calcified granuloma in the left upper lobe, also present back on 2012. Indistinct 4.3 by 1.6 cm left upper lobe bandlike density, possibly at least partially from superimposition of vascular shadows and the first rib end, but an underlying pulmonary nodule cannot be readily excluded. Atherosclerotic calcification of the aortic arch. Upper normal heart size given the AP technique. Linear subsegmental atelectasis or scarring at the lung bases. Mitral valve calcification. Right lower lateral rib deformities including the ninth rib, compatible with old healed fractures. Subtle interstitial accentuation in the lungs is nonspecific. IMPRESSION: 1. Indistinct bandlike density at the left lung apex, possibly from scarring or atelectasis or confluence of vascular and osseous shadows, but an underlying pulmonary nodule cannot be excluded and accordingly CT of the chest is recommended. 2. Mitral valve calcification. Aortic Atherosclerosis (ICD10-I70.0). 3. Old granulomatous disease. 4. Mild bibasilar scarring. 5. Interval but healed right lower lateral rib fractures. 6. Faint interstitial accentuation in the lungs, nonspecific. Electronically Signed   By: Van Clines  M.D.   On: 02/22/2022 09:41   CT Head Wo Contrast  Result Date: 02/22/2022 CLINICAL DATA:  Neuro deficit, acute, stroke suspected EXAM: CT HEAD WITHOUT CONTRAST TECHNIQUE: Contiguous axial images were obtained from the base of the skull through the vertex without intravenous contrast. RADIATION DOSE REDUCTION: This exam was performed according to the departmental dose-optimization program which includes automated exposure control, adjustment of the mA and/or kV according to patient size and/or use of iterative reconstruction technique. COMPARISON:  None Available. FINDINGS: Brain: No evidence of acute large vascular territory infarction, hemorrhage, hydrocephalus, extra-axial collection or mass lesion/mass effect. Patchy white matter hypodensities, nonspecific but compatible with chronic microvascular disease. Cerebral atrophy. Vascular: No hyperdense vessel identified. Calcific intracranial atherosclerosis. Skull: No acute fracture. Sinuses/Orbits: Minimal paranasal sinus mucosal thickening. No acute orbital findings. Other: Trace right mastoid effusion. IMPRESSION: No evidence of acute intracranial abnormality. Electronically Signed   By: Margaretha Sheffield M.D.   On: 02/22/2022 08:57     Assessment and Plan:   Paroxysmal Afib (rate controlled), h/o ablation 20 years ago, Acute/subacute CVA/infarct Small bowel obstruction s/p NG tube Gen weakness AKI on CKD 3b CXR showing asbestos,  Chronic comorbidities  Plan: - continue medical care for SBO and as per Gen sx recs - his afib is paroxysmal and is rate controlled. Unfortunately, he has evidence of acute/subacute CVA, however, he is frail, advanced age, recent fall  and extremely high risk of bleeding. As per discussion with hospitalist and patient family, no anticoagulation.  - would just put him on low dose metoprolol 12.54m bid - echo in am - continue to treat his medical issues - will likely sign off after ECHO   Risk  Assessment/Risk Scores:          CHA2DS2-VASc Score =   6  This indicates a  % annual risk of stroke. The patient's score is based upon:           For questions or updates, please contact [place] Please consult [email or web address] for contact info under    Signed, Renae Fickle, MD  02/23/2022 5:54 AM

## 2022-02-23 NOTE — Progress Notes (Signed)
Progress Note  Patient Name: Steven Ward Date of Encounter: 02/23/2022  [facility] Cardiologist: None   Subjective   Minimal conversation. He is comfortable  ABD xray IMPRESSION: 1. Nasogastric tube in appropriate position. 2. Persistent dilated loops of small bowel as on yesterday's CT that demonstrated small-bowel obstruction status post in this patient with history of colectomy.  MRI brain 6/7 IMPRESSION: 1. Punctate acute infarct within the right cerebellar hemisphere. No hemorrhage or mass effect. 2. Mildly hyperintense signal on diffusion-weighted imaging in the subcortical right frontal lobe without a clear ADC correlate. This may indicate a subacute infarct. 3. Chronic small vessel ischemic disease and cerebral volume loss.   Troponin negative  Inpatient Medications    Scheduled Meds:   stroke: early stages of recovery book   Does not apply Once   Carbidopa-Levodopa ER  1 tablet Oral QPM   Carbidopa-Levodopa ER  2 tablet Oral BID   diatrizoate meglumine-sodium  90 mL Per NG tube Once   insulin aspart  0-5 Units Subcutaneous QHS   insulin aspart  0-9 Units Subcutaneous TID WC   levothyroxine  25 mcg Oral Daily   lipase/protease/amylase  12,000 Units Oral TID AC   metoprolol tartrate  12.5 mg Oral BID   tamsulosin  0.4 mg Oral Daily   Continuous Infusions:  sodium chloride Stopped (02/22/22 1532)   cefTRIAXone (ROCEPHIN)  IV 2 g (02/23/22 0250)   metronidazole 500 mg (02/23/22 0328)   PRN Meds: acetaminophen **OR** acetaminophen, ondansetron   Vital Signs    Vitals:   02/22/22 1840 02/22/22 2000 02/22/22 2241 02/23/22 0409  BP: 99/65 113/61 117/70 (!) 98/48  Pulse: 84 85 87 77  Resp: '16 18 18 20  '$ Temp: 98.2 F (36.8 C) 98.2 F (36.8 C) 98.3 F (36.8 C) 98.3 F (36.8 C)  TempSrc: Oral Oral Oral Oral  SpO2: 97% 97% 97% 97%  Weight:  65.3 kg    Height:  '5\' 8"'$  (1.727 m)      Intake/Output Summary (Last 24 hours) at 02/23/2022 0837 Last  data filed at 02/23/2022 0600 Gross per 24 hour  Intake 1879.85 ml  Output 300 ml  Net 1579.85 ml      02/22/2022    8:00 PM 02/22/2022    8:30 AM 02/21/2022    8:38 AM  Last 3 Weights  Weight (lbs) 143 lb 15.4 oz 151 lb 0.2 oz 157 lb 3.2 oz  Weight (kg) 65.3 kg 68.5 kg 71.305 kg      Telemetry    Atrial fibrillation with PVCs - Personally Reviewed  ECG    NA - Personally Reviewed  Physical Exam   Vitals:   02/22/22 2241 02/23/22 0409  BP: 117/70 (!) 98/48  Pulse: 87 77  Resp: 18 20  Temp: 98.3 F (36.8 C) 98.3 F (36.8 C)  SpO2: 97% 97%    GEN: thin, frail Neck: No JVD Cardiac: irregular rate and rhythm, no murmurs, rubs, or gallops.  Respiratory: Clear to auscultation bilaterally. GI: Soft, nontender, non-distended  MS: No edema; No deformity. Neuro:  Nonfocal  Psych: Normal affect   Labs    High Sensitivity Troponin:   Recent Labs  Lab 02/22/22 0847  TROPONINIHS 13     Chemistry Recent Labs  Lab 02/22/22 0847 02/22/22 2032 02/23/22 0305  NA 140  --  141  K 4.0  --  3.8  CL 101  --  107  CO2 26  --  25  GLUCOSE 189*  --  141*  BUN 100*  --  93*  CREATININE 2.47*  --  2.50*  CALCIUM 9.3  --  8.2*  MG  --  2.0  --   PROT 6.8  --   --   ALBUMIN 3.4*  --   --   AST 18  --   --   ALT 18  --   --   ALKPHOS 55  --   --   BILITOT 1.2  --   --   GFRNONAA 23*  --  23*  ANIONGAP 13  --  9    Lipids  Recent Labs  Lab 02/23/22 0305  CHOL 76  TRIG 54  HDL 31*  LDLCALC 34  CHOLHDL 2.5    Hematology Recent Labs  Lab 02/22/22 0847 02/23/22 0305  WBC 3.6* 4.6  RBC 3.87* 3.61*  HGB 12.4* 11.7*  HCT 36.8* 35.1*  MCV 95.1 97.2  MCH 32.0 32.4  MCHC 33.7 33.3  RDW 14.3 14.2  PLT 166 159   Thyroid  Recent Labs  Lab 02/22/22 2032  TSH 2.173    BNPNo results for input(s): "BNP", "PROBNP" in the last 168 hours.  DDimer No results for input(s): "DDIMER" in the last 168 hours.   Radiology    DG CHEST PORT 1 VIEW  Result Date:  02/23/2022 CLINICAL DATA:  Check gastric catheter placement EXAM: PORTABLE CHEST 1 VIEW COMPARISON:  Abdomen film from earlier in the same day. FINDINGS: Gastric catheter now extends mildly into the stomach. No looping is noted within the esophagus. Cardiac shadow is within normal limits. Calcified granulomas are noted bilaterally. IMPRESSION: Gastric catheter now noted within the stomach. No looping in the esophagus is seen. Electronically Signed   By: Inez Catalina M.D.   On: 02/23/2022 03:21   DG Abd 1 View  Result Date: 02/23/2022 CLINICAL DATA:  Check gastric catheter placement EXAM: ABDOMEN - 1 VIEW COMPARISON:  Film from earlier in the same day FINDINGS: Gastric catheter is stable in appearance when compared with the prior exam it should be advanced deeper into the stomach. If significant advancement has been performed, chest x-ray is recommended to evaluate for possible looping within the proximal esophagus. IMPRESSION: Gastric catheter is stable from the prior exam. Chest x-ray may be helpful as described above. Electronically Signed   By: Inez Catalina M.D.   On: 02/23/2022 02:34   DG Abd 1 View  Result Date: 02/23/2022 CLINICAL DATA:  Check gastric catheter placement EXAM: ABDOMEN - 1 VIEW COMPARISON:  None Available. FINDINGS: Scattered large and small bowel gas is noted. Gastric catheter is noted with the tip at the expected level of the gastroesophageal junction. This should be advanced deeper into the stomach. IMPRESSION: Gastric catheter at the expected location of the gastroesophageal junction. This should be advanced deeper into the stomach. Electronically Signed   By: Inez Catalina M.D.   On: 02/23/2022 00:31   MR BRAIN WO CONTRAST  Result Date: 02/22/2022 CLINICAL DATA:  Acute neurologic deficit EXAM: MRI HEAD WITHOUT CONTRAST TECHNIQUE: Multiplanar, multiecho pulse sequences of the brain and surrounding structures were obtained without intravenous contrast. COMPARISON:  None Available.  FINDINGS: Brain: Punctate acute infarct within the right cerebellar hemisphere. There is mildly hyperintense signal on diffusion-weighted imaging in subcortical right frontal lobe without a clear ADC correlate. No acute or chronic hemorrhage. There is multifocal hyperintense T2-weighted signal within the white matter. Generalized cerebral volume loss. The midline structures are normal. Vascular: Major flow voids are preserved. Skull  and upper cervical spine: Normal calvarium and skull base. Visualized upper cervical spine and soft tissues are normal. Sinuses/Orbits:Right mastoid effusion. Paranasal sinuses are clear. Normal orbits. IMPRESSION: 1. Punctate acute infarct within the right cerebellar hemisphere. No hemorrhage or mass effect. 2. Mildly hyperintense signal on diffusion-weighted imaging in the subcortical right frontal lobe without a clear ADC correlate. This may indicate a subacute infarct. 3. Chronic small vessel ischemic disease and cerebral volume loss. Electronically Signed   By: Ulyses Jarred M.D.   On: 02/22/2022 21:46   CT CHEST ABDOMEN PELVIS WO CONTRAST  Result Date: 02/22/2022 CLINICAL DATA:  Inpatient. Abdominal distension. Poor p.o. intake. Generalized weakness. Or abnormal chest radiograph with possible pulmonary nodule. Remote history of resected left colon cancer in 1993 per prior report. * Tracking Code: BO * EXAM: CT CHEST, ABDOMEN AND PELVIS WITHOUT CONTRAST TECHNIQUE: Multidetector CT imaging of the chest, abdomen and pelvis was performed following the standard protocol without IV contrast. RADIATION DOSE REDUCTION: This exam was performed according to the departmental dose-optimization program which includes automated exposure control, adjustment of the mA and/or kV according to patient size and/or use of iterative reconstruction technique. COMPARISON:  Chest radiograph from earlier today. 06/30/2009 CT abdomen/pelvis. FINDINGS: CT CHEST FINDINGS Cardiovascular: Normal heart size.  No significant pericardial effusion/thickening. Three-vessel coronary atherosclerosis. Atherosclerotic nonaneurysmal thoracic aorta. Normal caliber pulmonary arteries. Mediastinum/Nodes: No discrete thyroid nodules. Unremarkable esophagus. No pathologically enlarged axillary, mediastinal or hilar lymph nodes, noting limited sensitivity for the detection of hilar adenopathy on this noncontrast study. Lungs/Pleura: No pneumothorax. Scattered calcified bilateral pleural plaques anteriorly and posteriorly. Trace bilateral pleural effusions, right greater than left. Ovoid 3.5 x 2.4 cm subpleural focus of consolidation in the dependent basilar right lower lobe (series 5/image 103), new from 2010 CT. Patchy confluent subpleural reticulation and ground-glass opacity with associated mild traction bronchiolectasis at both lung bases, mildly increased from prior CT. Mild-to-moderate patchy tree-in-bud opacities in the dependent basilar right upper lobe. No additional significant pulmonary nodules. Musculoskeletal: No aggressive appearing focal osseous lesions. Healed anterolateral right seventh through tenth rib fractures. Moderate T5 vertebral compression fracture of indeterminate chronicity and chronic severe T12 vertebral compression fracture. Mild thoracic spondylosis. CT ABDOMEN PELVIS FINDINGS Hepatobiliary: Normal liver with no liver mass. Cholecystectomy. Bile ducts are within normal post cholecystectomy limits with CBD diameter 7 mm. Pancreas: Progressive coarse calcifications throughout the atrophic pancreas with irregular dilated main pancreatic duct up to 7 mm diameter, compatible with chronic pancreatitis. No discrete pancreatic mass. No peripancreatic fluid collections. Spleen: Normal size. No mass. Adrenals/Urinary Tract: Normal adrenals. No renal stones. No hydronephrosis. Simple 1.2 cm posterior upper left renal cyst for which no follow-up is recommended. No additional contour deforming renal masses. Normal  bladder. Stomach/Bowel: Mildly distended stomach with air-fluid level. No gastric wall thickening. Diffuse dilatation of the proximal to mid small bowel with air-fluid levels, measuring up to 4.8 cm diameter. Distal small bowel is collapsed. Apparent focal small bowel caliber transition in the right abdomen (series 6/image 38). There is associated fat stranding and ill-defined fluid throughout the small bowel mesentery. There is some mesenteric twisting at the site of the caliber transition. No definite bowel wall thickening or pneumatosis. Normal appendix. Postsurgical change from partial distal colectomy with intact appearing colorectal anastomosis. No large bowel wall thickening, diverticulosis or significant pericolonic fat stranding. Vascular/Lymphatic: Atherosclerotic nonaneurysmal abdominal aorta. No pathologically enlarged lymph nodes in the abdomen or pelvis. Reproductive: Normal size prostate. No pneumoperitoneum. Small volume ascites. No focal fluid collection. Other: No  pneumoperitoneum, ascites or focal fluid collection. Surgical clips noted throughout the midline ventral abdominal wall. Musculoskeletal: No aggressive appearing focal osseous lesions. Marked lumbar spondylosis. IMPRESSION: 1. Mechanical mid to distal small-bowel obstruction with apparent focal small bowel caliber transition in the right abdomen, with associated fat stranding and ill-defined fluid throughout the small bowel mesentery and with some mesenteric twisting, raising the possibility of internal hernia. No definite bowel wall thickening. No pneumatosis. No free air. 2. Small volume ascites. 3. Bilateral calcified pleural plaques and trace bilateral pleural effusions, compatible with asbestos related pleural disease. Basilar predominant pulmonary fibrosis compatible with asbestosis. 4. Ovoid 3.5 cm subpleural focus of consolidation in the dependent basilar right lower lobe, new from 2010 CT, indeterminate for rounded atelectasis  versus pulmonary neoplasm. Suggest attention on follow-up chest CT in 3 months. 5. Mild-to-moderate patchy tree-in-bud opacities in the dependent basilar right upper lobe, compatible with nonspecific infectious or inflammatory bronchiolitis, with the differential including aspiration. 6. Chronic pancreatitis. 7. Three-vessel coronary atherosclerosis. 8. Moderate T5 vertebral compression fracture of indeterminate chronicity and chronic severe T12 vertebral compression fracture. 9. Aortic Atherosclerosis (ICD10-I70.0). These results will be called to the ordering clinician or representative by the Radiologist Assistant, and communication documented in the PACS or [employer]. Electronically Signed   By: Ilona Sorrel M.D.   On: 02/22/2022 21:39   DG Chest Port 1 View  Result Date: 02/22/2022 CLINICAL DATA:  Shortness of breath.  Weakness and fatigue. EXAM: PORTABLE CHEST 1 VIEW COMPARISON:  06/27/2011 FINDINGS: Suspected calcified granuloma in the left upper lobe, also present back on 2012. Indistinct 4.3 by 1.6 cm left upper lobe bandlike density, possibly at least partially from superimposition of vascular shadows and the first rib end, but an underlying pulmonary nodule cannot be readily excluded. Atherosclerotic calcification of the aortic arch. Upper normal heart size given the AP technique. Linear subsegmental atelectasis or scarring at the lung bases. Mitral valve calcification. Right lower lateral rib deformities including the ninth rib, compatible with old healed fractures. Subtle interstitial accentuation in the lungs is nonspecific. IMPRESSION: 1. Indistinct bandlike density at the left lung apex, possibly from scarring or atelectasis or confluence of vascular and osseous shadows, but an underlying pulmonary nodule cannot be excluded and accordingly CT of the chest is recommended. 2. Mitral valve calcification. Aortic Atherosclerosis (ICD10-I70.0). 3. Old granulomatous disease. 4. Mild bibasilar  scarring. 5. Interval but healed right lower lateral rib fractures. 6. Faint interstitial accentuation in the lungs, nonspecific. Electronically Signed   By: Van Clines M.D.   On: 02/22/2022 09:41   CT Head Wo Contrast  Result Date: 02/22/2022 CLINICAL DATA:  Neuro deficit, acute, stroke suspected EXAM: CT HEAD WITHOUT CONTRAST TECHNIQUE: Contiguous axial images were obtained from the base of the skull through the vertex without intravenous contrast. RADIATION DOSE REDUCTION: This exam was performed according to the departmental dose-optimization program which includes automated exposure control, adjustment of the mA and/or kV according to patient size and/or use of iterative reconstruction technique. COMPARISON:  None Available. FINDINGS: Brain: No evidence of acute large vascular territory infarction, hemorrhage, hydrocephalus, extra-axial collection or mass lesion/mass effect. Patchy white matter hypodensities, nonspecific but compatible with chronic microvascular disease. Cerebral atrophy. Vascular: No hyperdense vessel identified. Calcific intracranial atherosclerosis. Skull: No acute fracture. Sinuses/Orbits: Minimal paranasal sinus mucosal thickening. No acute orbital findings. Other: Trace right mastoid effusion. IMPRESSION: No evidence of acute intracranial abnormality. Electronically Signed   By: Margaretha Sheffield M.D.   On: 02/22/2022 08:57    Cardiac  Studies    07/25/2011-Stress myoview 2012- no ischemia or infarction  07/19/2011 - Left ventricle: The cavity size was normal. There was mild    focal basal and mild concentric hypertrophy of the septum.    Systolic function was normal. The estimated ejection    fraction was in the range of 55% to 60%. Wall motion was    normal; there were no regional wall motion abnormalities.    Doppler parameters are consistent with abnormal left    ventricular relaxation (grade 1 diastolic dysfunction).    Doppler parameters are consistent with  high ventricular    filling pressure.  - Aortic valve: The left coronary cusp appears fused.  - Left atrium: The atrium was moderately dilated.  Transthoracic echocardiography.  M-mode, complete 2D,  spectral Doppler, and color Doppler.  Height:  Height:  172.7cm. Height: 68in.  Weight:  Weight: 103.9kg. Weight:  228.5lb.  Body mass index:  BMI: 34.8kg/m^2.  Body surface  area:    BSA: 2.72m2.  Blood pressure:     126/77.  Patient  status:  Outpatient.  [facility]   [facility] Profile     Steven AUMILLERis a 88y.o. male with a hx of paroxysmal A-fib not on anticoagulation, hypertension, hypothyroidism, Parkinson's disease, lumbar spondylosis, type 2 diabetes, hyperlipidemia, PVD, CKD stage IIIb, remote history of colon cancer, BPH, pancreatic insufficiency, gallstones, GERD here with an SBO who is being seen 02/23/2022 for the evaluation of afib, pvcs at the request of Dr RMarlowe Sax  Assessment & Plan    #Paroxysmal Atrial Fibrillation: rate controlled. Cannot do AC 2/2 acute CVA. Therefore, no plans for DCCV. Can plan to continue metoprolol tartrate 12.5 mg BID, not much BP room to increase. If rates not controlled can start amiodarone gtt (oral transition amiodarone 200 mg BID for 7 days, amiodarone 200 mg daily); however there is risk of stroke; will be risk/benefit decision.   #PVCs: RVOT PVCs, typically benign. Will follow up his echo. No signs of ACS or CHF. K>4, Mg>2. If his Bps allow, can uptitrate his BB.  If his echo is normal, cardiology will sign off.  For questions or updates, please contact CGays MillsPlease consult [email or web address] for contact info under        Signed, BJanina Mayo MD  02/23/2022, 8:37 AM

## 2022-02-23 NOTE — Consult Note (Addendum)
Neurology Consultation  Reason for Consult: stroke on MRI  Referring Physician: Dr. Lonny Prude   CC: generalized weakness   History is obtained from:patient, son, RN and medical record   HPI: Steven Ward is a 88 y.o. male HTN, P afib not on AC, DM, hypothyroidism, Parkinson's disease, lumbar spondylosis, hyperlipidemia, PVD, CKD stage IIIb, remote history of colon cancer, BPH, pancreatic insufficiency, gallstones, GERD presents to the Ed for generalized weakness for about 1 week after seeing his PCP. He also had been vomiting with ongoing nausea not being able to eat or drink. Son found him on the floor day prior to admission. Son is at the bedside. He lives with son, uses a walker at baseline, however son says he is very independent and can do all things for himself.  MRI brain revealed punctate infarct in the right cerebellar hemisphere. Neurology consulted for stroke workup   LKW: PTA tpa given?: no, outside window Premorbid modified Rankin scale (mRS):  4-Needs assistance to walk and tend to bodily needs  ROS: Full ROS was performed and is negative except as noted in the HPI.   Past Medical History:  Diagnosis Date   ABNORMAL THYROID FUNCTION TESTS 01/17/2010   ALLERGIC RHINITIS 12/01/2008   Colon cancer ([place])    COLONIC POLYPS, HX OF 12/01/2008   DIABETES MELLITUS, TYPE II 12/01/2008   EDEMA LEG 05/04/2009   ESSENTIAL HYPERTENSION 12/01/2008   Exocrine pancreatic insufficiency    GALLSTONES 04/07/2009   GERD 12/01/2008   Hay fever    SPONDYLOSIS, LUMBAR 12/01/2008    Essential (primary) hypertension, Paroxysmal atrial fibrillation, and Type 2 diabetes mellitus with hyperglycemia    Family History  Problem Relation Age of Onset   Cancer Other        colon   Diabetes Other    Stroke Other    Cancer Mother      Social History:   reports that he quit smoking about 53 years ago. His smoking use included cigarettes. He has a 20.00 pack-year smoking history. He has never used  smokeless tobacco. He reports that he does not drink alcohol and does not use drugs.  Medications  Current Facility-Administered Medications:     stroke: early stages of recovery book, , Does not apply, Once, Shela Leff, MD   0.9 %  sodium chloride infusion, , Intravenous, Continuous, Shela Leff, MD, Last Rate: 125 mL/hr at 02/23/22 1716, New Bag at 02/23/22 1716   acetaminophen (TYLENOL) tablet 650 mg, 650 mg, Oral, Q6H PRN **OR** acetaminophen (TYLENOL) suppository 650 mg, 650 mg, Rectal, Q6H PRN, Shela Leff, MD   [START ON 02/24/2022] aspirin chewable tablet 81 mg, 81 mg, Oral, Daily, Beulah Gandy A, NP   aspirin tablet 325 mg, 325 mg, Oral, Once, Beulah Gandy A, NP   Carbidopa-Levodopa ER (SINEMET CR) 25-100 MG tablet controlled release 1 tablet, 1 tablet, Oral, QPM, Orma Flaming, MD, 1 tablet at 02/23/22 1717   Carbidopa-Levodopa ER (SINEMET CR) 25-100 MG tablet controlled release 2 tablet, 2 tablet, Oral, BID, Orma Flaming, MD, 2 tablet at 02/23/22 1337   cefTRIAXone (ROCEPHIN) 2 g in sodium chloride 0.9 % 100 mL IVPB, 2 g, Intravenous, Q2200, Shela Leff, MD, Last Rate: 200 mL/hr at 02/23/22 0250, 2 g at 02/23/22 0250   insulin aspart (novoLOG) injection 0-5 Units, 0-5 Units, Subcutaneous, QHS, Rathore, Vasundhra, MD   insulin aspart (novoLOG) injection 0-9 Units, 0-9 Units, Subcutaneous, TID WC, Shela Leff, MD, 1 Units at 02/23/22 1717   levothyroxine (SYNTHROID) tablet  25 mcg, 25 mcg, Oral, Daily, Shela Leff, MD, 25 mcg at 02/23/22 0556   lipase/protease/amylase (CREON) capsule 12,000 Units, 12,000 Units, Oral, TID AC, Shela Leff, MD   metoprolol tartrate (LOPRESSOR) tablet 12.5 mg, 12.5 mg, Oral, BID, Shela Leff, MD, 12.5 mg at 02/23/22 1338   ondansetron (ZOFRAN-ODT) disintegrating tablet 4 mg, 4 mg, Oral, Q8H PRN, Shela Leff, MD   tamsulosin (FLOMAX) capsule 0.4 mg, 0.4 mg, Oral, Daily, Shela Leff, MD,  0.4 mg at 02/23/22 1337   Exam: Current vital signs: BP 105/66 (BP Location: Left Arm)   Pulse 82   Temp 98.2 F (36.8 C) (Oral)   Resp 17   Ht '5\' 8"'$  (1.727 m)   Wt 65.3 kg   SpO2 97%   BMI 21.89 kg/m  Vital signs in last 24 hours: Temp:  [98.2 F (36.8 C)-98.3 F (36.8 C)] 98.2 F (36.8 C) (06/08 1129) Pulse Rate:  [72-87] 82 (06/08 1129) Resp:  [16-20] 17 (06/08 1129) BP: (98-117)/(48-70) 105/66 (06/08 1129) SpO2:  [97 %] 97 % (06/08 1129) Weight:  [65.3 kg] 65.3 kg (06/07 2000)  GENERAL: frail critically ill male, Awake, alert in NAD HEENT: - Normocephalic and atraumatic, dry mm edentulous  LUNGS - Clear to auscultation bilaterally with no wheezes CV - S1S2 RRR, no m/r/g, equal pulses bilaterally. ABDOMEN - Soft, nontender, nondistended with normoactive BS Ext: warm, well perfused, intact peripheral pulses, no edema  NEURO:  Mental Status: AA&Ox4.  Language: speech is clear.  Naming, repetition, fluency, and comprehension intact. Cranial Nerves: PERRL 2 mm/brisk. EOMI, visual fields full, no facial asymmetry, facial sensation intact, hearing intact, tongue/uvula/soft palate midline, normal sternocleidomastoid and trapezius muscle strength. No evidence of tongue atrophy or fibrillations Motor: 4/5 in all 4 extremities  Tone: is normal and bulk is normal Sensation- Intact to light touch bilaterally Coordination: FTN intact bilaterally, no ataxia in BLE. Gait- deferred  NIHSS 1a Level of Conscious.: 0 1b LOC Questions: 0 1c LOC Commands: 0 2 Best Gaze: 0 3 Visual: 0 4 Facial Palsy: 0 5a Motor Arm - left: 0 5b Motor Arm - Right: 0 6a Motor Leg - Left: 0 6b Motor Leg - Right: 0 7 Limb Ataxia: 0 8 Sensory: 0 9 Best Language: 0 10 Dysarthria: 0 11 Extinct. and Inatten.: 0 TOTAL: 0    Imaging I have reviewed the images obtained:  CT-head 6/7 No acute abnormality   MRI examination of the brain 6/7 1. Punctate acute infarct within the right cerebellar  hemisphere. No hemorrhage or mass effect. 2. Mildly hyperintense signal on diffusion-weighted imaging in the subcortical right frontal lobe without a clear ADC correlate. This may indicate a subacute infarct. 3. Chronic small vessel ischemic disease and cerebral volume loss.  Labs:  LDL-c 34 Echo EF 60-65%. No shunt   Assessment:  KELBY ADELL is a 88 y.o. male HTN, P afib not on AC, DM, hypothyroidism, Parkinson's disease, lumbar spondylosis, hyperlipidemia, PVD, CKD stage IIIb, remote history of colon cancer, BPH, pancreatic insufficiency, gallstones, GERD presents to the Ed for generalized weakness for about 1 week after seeing his PCP. He also had been vomiting with ongoing nausea not being able to eat or drink. Son found him on the floor day prior to admission. He lives with son, uses a walker at baseline, however son says he is very independent and can do all things for himself.  Acute right cerebellar hemisphere punctate ischemic stroke likely cardio embolic in the setting of P A fib  Recommendations: - DM management with goal A1C < 7 - Frequent neuro checks - no need for dopplers as this is posterior circulation.  - Prophylactic therapy-Anticoagulation if no contraindication.  - Risk factor modification - Telemetry monitoring - PT consult, OT consult, Speech consult - Neurology will be available as needed.   Beulah Gandy DNP, ACNPC-AG   I have seen and evaluated the patient. I have reviewed the above note and made appropriate changes. He has a small punctate stroke. This certainly could have caused nausea and vomiting in this location, but typically this is associated with vertigo which he denies. I think his general asthenia is likely related to general medical condition/poor po intake, but certainly the stroke could have contributed.   It is very likely related to afib and if no contraindication exists, I would favor starting a novel anticoagulant such as eliquis. I tried  calling son to ask about why he is not anticoagulated but did not get an answer.   Neurology will be available as needed.   Roland Rack, MD [facility] [phone]  If 7pm- 7am, please page neurology on call as listed in [place].

## 2022-02-23 NOTE — Progress Notes (Signed)
PROGRESS NOTE    Steven Ward  YHT:[id number] DOB: May 25, 1925 DOA: 02/22/2022 PCP: [name], MD   Brief Narrative: Steven Ward is a 88 y.o. male with medical history significant of paroxysmal A-fib not on anticoagulation, hypertension, hypothyroidism, Parkinson's disease, lumbar spondylosis, type 2 diabetes, hyperlipidemia, PVD, CKD stage IIIb, remote history of colon cancer, BPH, pancreatic insufficiency, gallstones, GERD. Patient presented secondary to generalized weakness and found to have evidence of atrial fibrillation, SBO and acute infarct. General surgery consulted; NPO with NG tube placed. Cardiology consulted and patient's rate managed with beta blocker. Neurology consulted for stroke.   Assessment and Plan:  Paroxysmal atrial fibrillation Rate controlled. Cardiology consulted. Transthoracic Echocardiogram ordered and significant for severely dilated left atrium with normal LVEF. Anticoagulation deferred secondary to acute infarct, per cardiology. Started on metoprolol PO -Cardiology recommendations: Metoprolol; considering amiodarone if rates are not controlled  Small bowel obstruction Noted on CT abdomen/pelvis on 6/7. General surgery consulted. NG tube placed and intermittent suction applied. NPO. -General surgery recommendations: SBO protocol  AKI on CKD stage IIIb Baseline creatinine of about 1.7-2. Creatinine of 2.47 on admission with peak of 2.5. Started on IV fluids -Continue IV fluids  Acute CVA Acute punctate infarct noted on MRI located within the right cerebellar hemisphere in addition to possible subacute infarct of right frontal lobe. LDL of 34. Hemoglobin A1C of 7.7%. Transthoracic Echocardiogram with normal LVEF of 55-60% with no evidence of atrial level shunt. -Aspirin 300 mg suppository -Neurology consult -PT/OT/SLP  Generalized weakness PT recommending CIR. Consult placed.  Primary hypertension Patient is on hydrochlorothiazide and  Lasix as an outpatient. Started on metoprolol inpatient. -Continue metoprolol  Right upper lobe opacities Concerning for possible infection. Asymptomatic at this time. Started on Ceftriaxone and Flagyl for CAP/aspiration pneumonia -Continue Ceftriaxone -Discontinue Flagyl  Diabetes mellitus, type 2 Hemoglobin A1C of 7.7%. Patient on glipizide.  Parkinson disease -Continue Sinemet CR  Hypothyroidism -Continue Synthroid  BPH -Continue tamsulosin  Chronic pancreatic insufficiency -Continue Creon  T5 and T12 vertebral compression fracture Incidental finding. Chronic.   Aortic atherosclerosis Noted on CT imaging.  Bilateral pleural plaques Pulmonary fibrosis Per CT read, consistent with asbestos related pleural disease.  3.5 cm RLL mass Newly noted. Rounded atelectasis vs pulmonary neoplasm. Recommendation for follow-up in 3 months  Pressure injury Right perineum, POA. -Wound care per nursing    DVT prophylaxis: SCDs Code Status:   Code Status: DNR Family Communication: None at bedside Disposition Plan: Discharge possibly to CIR pending management of SBO   Consultants:  General surgery Cardiology Neurology  Procedures:  None  Antimicrobials: Ceftriaxone Flagyl    Subjective: Patient reports being thirsty. No abdominal pain at this time. No nausea or vomiting at this time with NG tube in.  Objective: BP 105/66 (BP Location: Left Arm)   Pulse 82   Temp 98.2 F (36.8 C) (Oral)   Resp 17   Ht $R'5\' 8"'Tj$  (1.727 m)   Wt 65.3 kg   SpO2 97%   BMI 21.89 kg/m   Examination:  General exam: Appears calm and comfortable Respiratory system: Clear to auscultation. Respiratory effort normal. Cardiovascular system: S1 & S2 heard. No murmurs. Gastrointestinal system: Abdomen is distended, soft and nontender. Normal bowel sounds heard. Central nervous system: Alert and oriented. No focal neurological deficits. Musculoskeletal: No edema. No calf tenderness Skin:  No cyanosis. No rashes Psychiatry: Judgement and insight appear normal. Mood & affect appropriate.    Data Reviewed: I have personally reviewed following labs and  imaging studies  CBC Lab Results  Component Value Date   WBC 4.6 02/23/2022   RBC 3.61 (L) 02/23/2022   HGB 11.7 (L) 02/23/2022   HCT 35.1 (L) 02/23/2022   MCV 97.2 02/23/2022   MCH 32.4 02/23/2022   PLT 159 02/23/2022   MCHC 33.3 02/23/2022   RDW 14.2 02/23/2022   LYMPHSABS 0.8 02/22/2022   MONOABS 0.7 02/22/2022   EOSABS 0.0 02/22/2022   BASOSABS 0.0 93/71/6967     Last metabolic panel Lab Results  Component Value Date   NA 141 02/23/2022   K 3.8 02/23/2022   CL 107 02/23/2022   CO2 25 02/23/2022   BUN 93 (H) 02/23/2022   CREATININE 2.50 (H) 02/23/2022   GLUCOSE 141 (H) 02/23/2022   GFRNONAA 23 (L) 02/23/2022   GFRAA  04/26/2009    >60        The eGFR has been calculated using the MDRD equation. This calculation has not been validated in all clinical situations. eGFR's persistently <60 mL/min signify possible Chronic Kidney Disease.   CALCIUM 8.2 (L) 02/23/2022   PROT 6.8 02/22/2022   ALBUMIN 3.4 (L) 02/22/2022   BILITOT 1.2 02/22/2022   ALKPHOS 55 02/22/2022   AST 18 02/22/2022   ALT 18 02/22/2022   ANIONGAP 9 02/23/2022    GFR: Estimated Creatinine Clearance: 16.3 mL/min (A) (by C-G formula based on SCr of 2.5 mg/dL (H)).  No results found for this or any previous visit (from the past 240 hour(s)).    Radiology Studies: DG Abd Portable 1V-Small Bowel Protocol-Position Verification  Result Date: 02/23/2022 CLINICAL DATA:  Nasogastric tube placement EXAM: PORTABLE ABDOMEN - 1 VIEW COMPARISON:  Radiograph upper abdomen earlier same day FINDINGS: Interval advancement of nasogastric tube with side port just distal to the gastroesophageal junction and tip overlying the stomach in left upper quadrant. Air is again seen within dilated loops of small bowel, noting a right lower quadrant transition  point on yesterday's CT indicating mid to distal small bowel obstruction. Cholecystectomy clips. IMPRESSION: 1. Nasogastric tube in appropriate position. 2. Persistent dilated loops of small bowel as on yesterday's CT that demonstrated small-bowel obstruction status post in this patient with history of colectomy. Electronically Signed   By: Yvonne Kendall M.D.   On: 02/23/2022 08:37   DG CHEST PORT 1 VIEW  Result Date: 02/23/2022 CLINICAL DATA:  Check gastric catheter placement EXAM: PORTABLE CHEST 1 VIEW COMPARISON:  Abdomen film from earlier in the same day. FINDINGS: Gastric catheter now extends mildly into the stomach. No looping is noted within the esophagus. Cardiac shadow is within normal limits. Calcified granulomas are noted bilaterally. IMPRESSION: Gastric catheter now noted within the stomach. No looping in the esophagus is seen. Electronically Signed   By: Inez Catalina M.D.   On: 02/23/2022 03:21   DG Abd 1 View  Result Date: 02/23/2022 CLINICAL DATA:  Check gastric catheter placement EXAM: ABDOMEN - 1 VIEW COMPARISON:  Film from earlier in the same day FINDINGS: Gastric catheter is stable in appearance when compared with the prior exam it should be advanced deeper into the stomach. If significant advancement has been performed, chest x-ray is recommended to evaluate for possible looping within the proximal esophagus. IMPRESSION: Gastric catheter is stable from the prior exam. Chest x-ray may be helpful as described above. Electronically Signed   By: Inez Catalina M.D.   On: 02/23/2022 02:34   DG Abd 1 View  Result Date: 02/23/2022 CLINICAL DATA:  Check gastric catheter placement EXAM:  ABDOMEN - 1 VIEW COMPARISON:  None Available. FINDINGS: Scattered large and small bowel gas is noted. Gastric catheter is noted with the tip at the expected level of the gastroesophageal junction. This should be advanced deeper into the stomach. IMPRESSION: Gastric catheter at the expected location of the  gastroesophageal junction. This should be advanced deeper into the stomach. Electronically Signed   By: Inez Catalina M.D.   On: 02/23/2022 00:31   MR BRAIN WO CONTRAST  Result Date: 02/22/2022 CLINICAL DATA:  Acute neurologic deficit EXAM: MRI HEAD WITHOUT CONTRAST TECHNIQUE: Multiplanar, multiecho pulse sequences of the brain and surrounding structures were obtained without intravenous contrast. COMPARISON:  None Available. FINDINGS: Brain: Punctate acute infarct within the right cerebellar hemisphere. There is mildly hyperintense signal on diffusion-weighted imaging in subcortical right frontal lobe without a clear ADC correlate. No acute or chronic hemorrhage. There is multifocal hyperintense T2-weighted signal within the white matter. Generalized cerebral volume loss. The midline structures are normal. Vascular: Major flow voids are preserved. Skull and upper cervical spine: Normal calvarium and skull base. Visualized upper cervical spine and soft tissues are normal. Sinuses/Orbits:Right mastoid effusion. Paranasal sinuses are clear. Normal orbits. IMPRESSION: 1. Punctate acute infarct within the right cerebellar hemisphere. No hemorrhage or mass effect. 2. Mildly hyperintense signal on diffusion-weighted imaging in the subcortical right frontal lobe without a clear ADC correlate. This may indicate a subacute infarct. 3. Chronic small vessel ischemic disease and cerebral volume loss. Electronically Signed   By: Ulyses Jarred M.D.   On: 02/22/2022 21:46   CT CHEST ABDOMEN PELVIS WO CONTRAST  Result Date: 02/22/2022 CLINICAL DATA:  Inpatient. Abdominal distension. Poor p.o. intake. Generalized weakness. Or abnormal chest radiograph with possible pulmonary nodule. Remote history of resected left colon cancer in 1993 per prior report. * Tracking Code: BO * EXAM: CT CHEST, ABDOMEN AND PELVIS WITHOUT CONTRAST TECHNIQUE: Multidetector CT imaging of the chest, abdomen and pelvis was performed following the  standard protocol without IV contrast. RADIATION DOSE REDUCTION: This exam was performed according to the departmental dose-optimization program which includes automated exposure control, adjustment of the mA and/or kV according to patient size and/or use of iterative reconstruction technique. COMPARISON:  Chest radiograph from earlier today. 06/30/2009 CT abdomen/pelvis. FINDINGS: CT CHEST FINDINGS Cardiovascular: Normal heart size. No significant pericardial effusion/thickening. Three-vessel coronary atherosclerosis. Atherosclerotic nonaneurysmal thoracic aorta. Normal caliber pulmonary arteries. Mediastinum/Nodes: No discrete thyroid nodules. Unremarkable esophagus. No pathologically enlarged axillary, mediastinal or hilar lymph nodes, noting limited sensitivity for the detection of hilar adenopathy on this noncontrast study. Lungs/Pleura: No pneumothorax. Scattered calcified bilateral pleural plaques anteriorly and posteriorly. Trace bilateral pleural effusions, right greater than left. Ovoid 3.5 x 2.4 cm subpleural focus of consolidation in the dependent basilar right lower lobe (series 5/image 103), new from 2010 CT. Patchy confluent subpleural reticulation and ground-glass opacity with associated mild traction bronchiolectasis at both lung bases, mildly increased from prior CT. Mild-to-moderate patchy tree-in-bud opacities in the dependent basilar right upper lobe. No additional significant pulmonary nodules. Musculoskeletal: No aggressive appearing focal osseous lesions. Healed anterolateral right seventh through tenth rib fractures. Moderate T5 vertebral compression fracture of indeterminate chronicity and chronic severe T12 vertebral compression fracture. Mild thoracic spondylosis. CT ABDOMEN PELVIS FINDINGS Hepatobiliary: Normal liver with no liver mass. Cholecystectomy. Bile ducts are within normal post cholecystectomy limits with CBD diameter 7 mm. Pancreas: Progressive coarse calcifications throughout  the atrophic pancreas with irregular dilated main pancreatic duct up to 7 mm diameter, compatible with chronic pancreatitis. No discrete pancreatic  mass. No peripancreatic fluid collections. Spleen: Normal size. No mass. Adrenals/Urinary Tract: Normal adrenals. No renal stones. No hydronephrosis. Simple 1.2 cm posterior upper left renal cyst for which no follow-up is recommended. No additional contour deforming renal masses. Normal bladder. Stomach/Bowel: Mildly distended stomach with air-fluid level. No gastric wall thickening. Diffuse dilatation of the proximal to mid small bowel with air-fluid levels, measuring up to 4.8 cm diameter. Distal small bowel is collapsed. Apparent focal small bowel caliber transition in the right abdomen (series 6/image 38). There is associated fat stranding and ill-defined fluid throughout the small bowel mesentery. There is some mesenteric twisting at the site of the caliber transition. No definite bowel wall thickening or pneumatosis. Normal appendix. Postsurgical change from partial distal colectomy with intact appearing colorectal anastomosis. No large bowel wall thickening, diverticulosis or significant pericolonic fat stranding. Vascular/Lymphatic: Atherosclerotic nonaneurysmal abdominal aorta. No pathologically enlarged lymph nodes in the abdomen or pelvis. Reproductive: Normal size prostate. No pneumoperitoneum. Small volume ascites. No focal fluid collection. Other: No pneumoperitoneum, ascites or focal fluid collection. Surgical clips noted throughout the midline ventral abdominal wall. Musculoskeletal: No aggressive appearing focal osseous lesions. Marked lumbar spondylosis. IMPRESSION: 1. Mechanical mid to distal small-bowel obstruction with apparent focal small bowel caliber transition in the right abdomen, with associated fat stranding and ill-defined fluid throughout the small bowel mesentery and with some mesenteric twisting, raising the possibility of internal hernia.  No definite bowel wall thickening. No pneumatosis. No free air. 2. Small volume ascites. 3. Bilateral calcified pleural plaques and trace bilateral pleural effusions, compatible with asbestos related pleural disease. Basilar predominant pulmonary fibrosis compatible with asbestosis. 4. Ovoid 3.5 cm subpleural focus of consolidation in the dependent basilar right lower lobe, new from 2010 CT, indeterminate for rounded atelectasis versus pulmonary neoplasm. Suggest attention on follow-up chest CT in 3 months. 5. Mild-to-moderate patchy tree-in-bud opacities in the dependent basilar right upper lobe, compatible with nonspecific infectious or inflammatory bronchiolitis, with the differential including aspiration. 6. Chronic pancreatitis. 7. Three-vessel coronary atherosclerosis. 8. Moderate T5 vertebral compression fracture of indeterminate chronicity and chronic severe T12 vertebral compression fracture. 9. Aortic Atherosclerosis (ICD10-I70.0). These results will be called to the ordering clinician or representative by the Radiologist Assistant, and communication documented in the PACS or [employer]. Electronically Signed   By: Ilona Sorrel M.D.   On: 02/22/2022 21:39   DG Chest Port 1 View  Result Date: 02/22/2022 CLINICAL DATA:  Shortness of breath.  Weakness and fatigue. EXAM: PORTABLE CHEST 1 VIEW COMPARISON:  06/27/2011 FINDINGS: Suspected calcified granuloma in the left upper lobe, also present back on 2012. Indistinct 4.3 by 1.6 cm left upper lobe bandlike density, possibly at least partially from superimposition of vascular shadows and the first rib end, but an underlying pulmonary nodule cannot be readily excluded. Atherosclerotic calcification of the aortic arch. Upper normal heart size given the AP technique. Linear subsegmental atelectasis or scarring at the lung bases. Mitral valve calcification. Right lower lateral rib deformities including the ninth rib, compatible with old healed fractures.  Subtle interstitial accentuation in the lungs is nonspecific. IMPRESSION: 1. Indistinct bandlike density at the left lung apex, possibly from scarring or atelectasis or confluence of vascular and osseous shadows, but an underlying pulmonary nodule cannot be excluded and accordingly CT of the chest is recommended. 2. Mitral valve calcification. Aortic Atherosclerosis (ICD10-I70.0). 3. Old granulomatous disease. 4. Mild bibasilar scarring. 5. Interval but healed right lower lateral rib fractures. 6. Faint interstitial accentuation in the lungs, nonspecific. Electronically Signed  By: Van Clines M.D.   On: 02/22/2022 09:41   CT Head Wo Contrast  Result Date: 02/22/2022 CLINICAL DATA:  Neuro deficit, acute, stroke suspected EXAM: CT HEAD WITHOUT CONTRAST TECHNIQUE: Contiguous axial images were obtained from the base of the skull through the vertex without intravenous contrast. RADIATION DOSE REDUCTION: This exam was performed according to the departmental dose-optimization program which includes automated exposure control, adjustment of the mA and/or kV according to patient size and/or use of iterative reconstruction technique. COMPARISON:  None Available. FINDINGS: Brain: No evidence of acute large vascular territory infarction, hemorrhage, hydrocephalus, extra-axial collection or mass lesion/mass effect. Patchy white matter hypodensities, nonspecific but compatible with chronic microvascular disease. Cerebral atrophy. Vascular: No hyperdense vessel identified. Calcific intracranial atherosclerosis. Skull: No acute fracture. Sinuses/Orbits: Minimal paranasal sinus mucosal thickening. No acute orbital findings. Other: Trace right mastoid effusion. IMPRESSION: No evidence of acute intracranial abnormality. Electronically Signed   By: Margaretha Sheffield M.D.   On: 02/22/2022 08:57      LOS: 0 days    Cordelia Poche, MD [facility] 02/23/2022, 3:17 PM   If 7PM-7AM, please contact  night-coverage [email or web address]

## 2022-02-23 NOTE — Progress Notes (Signed)
Inpatient Rehab Admissions Coordinator:   Per therapy recommendations pt was screened for CIR by Shann Medal, PT, DPT.  At this time we are recommending a CIR consult and I will place an order per our protocol.  Please contact me with questions.   Shann Medal, PT, DPT Admissions Coordinator [phone] 02/23/22  2:51 PM

## 2022-02-23 NOTE — Progress Notes (Signed)
Initial Nutrition Assessment  DOCUMENTATION CODES:   Not applicable  INTERVENTION:  Diet advancement as able per MD team.   NUTRITION DIAGNOSIS:   Inadequate oral intake related to inability to eat as evidenced by NPO status.  GOAL:   Patient will meet greater than or equal to 90% of their needs  MONITOR:   Diet advancement, Labs, Weight trends, Skin, I & O's  REASON FOR ASSESSMENT:   Consult Assessment of nutrition requirement/status  ASSESSMENT:   88 y.o. male with medical history significant of paroxysmal A-fib not on anticoagulation, hypertension, hypothyroidism, Parkinson's disease, lumbar spondylosis, type 2 diabetes, hyperlipidemia, PVD, CKD stage IIIb, remote history of colon cancer, BPH, pancreatic insufficiency, gallstones, GERD presents with SBO.  Pt with NGT in place to suction. Pt currently NPO. Pt unable to state nutrition history. Awaiting diet advancement as able/appropriate per MD and team. RD to order nutritional supplements once diet advances.   NUTRITION - FOCUSED PHYSICAL EXAM: Depletion may be related to the natural aging process.   Flowsheet Row Most Recent Value  Orbital Region Moderate depletion  Upper Arm Region Moderate depletion  Thoracic and Lumbar Region No depletion  Buccal Region Moderate depletion  Temple Region Moderate depletion  Clavicle Bone Region Moderate depletion  Clavicle and Acromion Bone Region Moderate depletion  Scapular Bone Region Unable to assess  Dorsal Hand Unable to assess  Patellar Region Moderate depletion  Anterior Thigh Region Moderate depletion  Posterior Calf Region Moderate depletion  Edema (RD Assessment) Mild  Hair Reviewed  Eyes Reviewed  Mouth Reviewed  Skin Reviewed  Nails Reviewed      Labs and medications reviewed.   Diet Order:   Diet Order             Diet NPO time specified  Diet effective now                   EDUCATION NEEDS:   Not appropriate for education at this  time  Skin:  Skin Assessment: Skin Integrity Issues: Skin Integrity Issues:: Stage II Stage II: perineum  Last BM:  6/8  Height:   Ht Readings from Last 1 Encounters:  02/22/22 '5\' 8"'$  (1.727 m)    Weight:   Wt Readings from Last 1 Encounters:  02/22/22 65.3 kg   BMI:  Body mass index is 21.89 kg/m.  Estimated Nutritional Needs:   Kcal:  1700-1850  Protein:  75-85 grams  Fluid:  >/= 1.7 L/day  Corrin Parker, MS, RD, LDN RD pager number/after hours weekend pager number on Amion.

## 2022-02-23 NOTE — Progress Notes (Signed)
Pt has NGT connected to low intermittent suction, working good with 431m output. Pt was complaining of something stuck on his throat, pt initially said it was phlegm, however pt continue to feeling something was stuck so RN view his mouht with a flashlight and NGT was kink on the roof of his throat. MD was informed and PA wants to reposition the NGT by pulling and readvancing the NGT. RN tried to reposition the NGT 3x with another RN but failed. NGT was removed and a new one was applied. Repeat KUB was ordered. MD was notified. Pending KUB result to continue suction.

## 2022-02-23 NOTE — Consult Note (Signed)
[facility] Nurse Consult Note: Reason for Consult: Consult requested for inner gluteal fold.  Performed remotely after review of progress notes and wound care flow sheet.  Pt is noted to have a Stage 2 pressure injury to the inner gluteal fold, present on admission.  These can be treated independently by the bedside nurse using the skin care order set and application of a  foam dressing to protect from further injury. Please re-consult if further assistance is needed.  Thank-you,  Julien Girt MSN, [place], [place], [place], [place]

## 2022-02-23 NOTE — Progress Notes (Signed)
Subjective No acute events. Denies any abdominal pain. Denies n/v but has NG in place.  Objective: Vital signs in last 24 hours: Temp:  [98.2 F (36.8 C)-98.7 F (37.1 C)] 98.3 F (36.8 C) (06/08 0409) Pulse Rate:  [72-87] 77 (06/08 0409) Resp:  [14-20] 20 (06/08 0409) BP: (98-118)/(48-80) 98/48 (06/08 0409) SpO2:  [96 %-100 %] 97 % (06/08 0409) Weight:  [65.3 kg] 65.3 kg (06/07 2000) Last BM Date : 02/23/22 (smear)  Intake/Output from previous day: 06/07 0701 - 06/08 0700 In: 1879.9 [I.V.:704.2; IV Piggyback:1175.7] Out: 300 [Urine:300] Intake/Output this shift: No intake/output data recorded.  Gen: NAD, comfortable CV: RRR Pulm: Normal work of breathing Abd: Very soft, moderately distended, nontender throughout Ext: SCDs in place  Lab Results: CBC  Recent Labs    02/22/22 0847 02/23/22 0305  WBC 3.6* 4.6  HGB 12.4* 11.7*  HCT 36.8* 35.1*  PLT 166 159   BMET Recent Labs    02/22/22 0847 02/23/22 0305  NA 140 141  K 4.0 3.8  CL 101 107  CO2 26 25  GLUCOSE 189* 141*  BUN 100* 93*  CREATININE 2.47* 2.50*  CALCIUM 9.3 8.2*   PT/INR No results for input(s): "LABPROT", "INR" in the last 72 hours. ABG No results for input(s): "PHART", "HCO3" in the last 72 hours.  Invalid input(s): "PCO2", "PO2"  Studies/Results:  Anti-infectives: Anti-infectives (From admission, onward)    Start     Dose/Rate Route Frequency Ordered Stop   02/23/22 0000  cefTRIAXone (ROCEPHIN) 2 g in sodium chloride 0.9 % 100 mL IVPB        2 g 200 mL/hr over 30 Minutes Intravenous Daily at 10 pm 02/22/22 2346     02/23/22 0000  metroNIDAZOLE (FLAGYL) IVPB 500 mg        500 mg 100 mL/hr over 60 Minutes Intravenous 2 times daily 02/22/22 2346          Assessment/Plan: Patient Active Problem List   Diagnosis Date Noted   Paroxysmal atrial fibrillation ([place]) 02/22/2022   Generalized weakness 02/22/2022   Nausea & vomiting 02/22/2022   Acute kidney injury superimposed on  chronic kidney disease ([place]) 02/22/2022   Anemia 07/06/2021   Atrial flutter ([place]) 07/06/2021   Disease of pancreas 07/06/2021   Encounter for immunization 07/06/2021   Enlarged prostate 07/06/2021   Hyperlipidemia 07/06/2021   Malignant neoplasm of colon ([place]) 07/06/2021   Other B-complex deficiencies 07/06/2021   Peripheral vascular disease ([place]) 07/06/2021   Pure hypercholesterolemia 07/06/2021   At moderate risk for fall 08/05/2019   PD (Parkinson's disease) ([place]) 01/15/2018   Dyslipidemia 04/08/2017   Major depressive episode 04/19/2016   CKD stage 3 due to type 2 diabetes mellitus ([place]) 08/25/2015   Spinal stenosis of lumbar region 05/25/2015   BPH (benign prostatic hyperplasia) 11/10/2013   Bilateral lumbar radiculopathy 11/10/2013   Obesity (BMI 30-39.9) 04/21/2013   Dyspnea 06/23/2011   PAF (paroxysmal atrial fibrillation) ([place]) 06/23/2011   SKIN RASH 01/17/2010   ABNORMAL THYROID FUNCTION TESTS 01/17/2010   EDEMA LEG 05/04/2009   GALLSTONES 04/07/2009   ABDOMINAL PAIN RIGHT UPPER QUADRANT 04/07/2009   DIARRHEA 02/26/2009   Type 2 diabetes mellitus, controlled ([place]) 12/01/2008   Essential hypertension 12/01/2008   ALLERGIC RHINITIS 12/01/2008   GERD 12/01/2008   SPONDYLOSIS, LUMBAR 12/01/2008   COLONIC POLYPS, HX OF 12/01/2008   -Continues to have a very benign and reassuring exam  -SBO Protocol -We will follow with you   LOS: 0 days  I spent a total of 35 minutes in both face-to-face and non-face-to-face activities, excluding procedures performed, for this visit on the date of this encounter.  Nadeen Landau, [facility], [place]

## 2022-02-24 ENCOUNTER — Inpatient Hospital Stay (HOSPITAL_COMMUNITY): Payer: PPO

## 2022-02-24 DIAGNOSIS — I480 Paroxysmal atrial fibrillation: Secondary | ICD-10-CM

## 2022-02-24 DIAGNOSIS — N179 Acute kidney failure, unspecified: Secondary | ICD-10-CM | POA: Diagnosis not present

## 2022-02-24 DIAGNOSIS — E1122 Type 2 diabetes mellitus with diabetic chronic kidney disease: Secondary | ICD-10-CM | POA: Diagnosis not present

## 2022-02-24 DIAGNOSIS — R531 Weakness: Secondary | ICD-10-CM | POA: Diagnosis not present

## 2022-02-24 LAB — GLUCOSE, CAPILLARY
Glucose-Capillary: 102 mg/dL — ABNORMAL HIGH (ref 70–99)
Glucose-Capillary: 104 mg/dL — ABNORMAL HIGH (ref 70–99)
Glucose-Capillary: 186 mg/dL — ABNORMAL HIGH (ref 70–99)
Glucose-Capillary: 241 mg/dL — ABNORMAL HIGH (ref 70–99)

## 2022-02-24 LAB — BASIC METABOLIC PANEL
Anion gap: 15 (ref 5–15)
BUN: 102 mg/dL — ABNORMAL HIGH (ref 8–23)
CO2: 20 mmol/L — ABNORMAL LOW (ref 22–32)
Calcium: 8.1 mg/dL — ABNORMAL LOW (ref 8.9–10.3)
Chloride: 110 mmol/L (ref 98–111)
Creatinine, Ser: 2.79 mg/dL — ABNORMAL HIGH (ref 0.61–1.24)
GFR, Estimated: 20 mL/min — ABNORMAL LOW (ref >60)
Glucose, Bld: 124 mg/dL — ABNORMAL HIGH (ref 70–99)
Potassium: 3.2 mmol/L — ABNORMAL LOW (ref 3.5–5.1)
Sodium: 145 mmol/L (ref 135–145)

## 2022-02-24 LAB — HEMOGLOBIN A1C
Hgb A1c MFr Bld: 7.9 % — ABNORMAL HIGH (ref 4.8–5.6)
Mean Plasma Glucose: 180.03 mg/dL

## 2022-02-24 MED ORDER — BOOST / RESOURCE BREEZE PO LIQD CUSTOM
1.0000 | Freq: Three times a day (TID) | ORAL | Status: DC
  Administered 2022-02-24 – 2022-02-25 (×4): 1 via ORAL

## 2022-02-24 NOTE — Progress Notes (Signed)
Spoke with patients family and updated them about patients condition. They did have questions about imaging studies that were performed on patient.

## 2022-02-24 NOTE — Progress Notes (Signed)
Nutrition Follow-up  DOCUMENTATION CODES:   Not applicable  INTERVENTION:  Provide Boost Breeze po TID, each supplement provides 250 kcal and 9 grams of protein.  NUTRITION DIAGNOSIS:   Inadequate oral intake related to inability to eat as evidenced by NPO status; diet advances; progressing  GOAL:   Patient will meet greater than or equal to 90% of their needs; progressing  MONITOR:   Diet advancement, Labs, Weight trends, Skin, I & O's  REASON FOR ASSESSMENT:   Consult Assessment of nutrition requirement/status  ASSESSMENT:   88 y.o. male with medical history significant of paroxysmal A-fib not on anticoagulation, hypertension, hypothyroidism, Parkinson's disease, lumbar spondylosis, type 2 diabetes, hyperlipidemia, PVD, CKD stage IIIb, remote history of colon cancer, BPH, pancreatic insufficiency, gallstones, GERD presents with SBO.  NGT clamped. Diet advanced to a clear liquid diet. RD to order nutritional supplements to aid in caloric and protein needs. Labs and medications reviewed.   Diet Order:   Diet Order             Diet clear liquid Room service appropriate? Yes; Fluid consistency: Thin  Diet effective now                   EDUCATION NEEDS:   Not appropriate for education at this time  Skin:  Skin Assessment: Reviewed RN Assessment Skin Integrity Issues:: Stage II Stage II: perineum  Last BM:  6/9  Height:   Ht Readings from Last 1 Encounters:  02/22/22 '5\' 8"'$  (1.727 m)    Weight:   Wt Readings from Last 1 Encounters:  02/24/22 68.8 kg   BMI:  Body mass index is 23.06 kg/m.  Estimated Nutritional Needs:   Kcal:  1700-1850  Protein:  75-85 grams  Fluid:  >/= 1.7 L/day  Corrin Parker, MS, RD, LDN RD pager number/after hours weekend pager number on Amion.

## 2022-02-24 NOTE — Progress Notes (Signed)
Physical Therapy Treatment Patient Details Name: Steven Ward MRN: [id number] DOB: 31-Oct-1924 Today's Date: 02/24/2022   History of Present Illness The pt is a 88 yo male presenting 6/7 with weakness, fatigue, poor appetite, and fall while walking to the bathroom on day of admission. Pt found to be in afib with frequent PVCs (new after ablation 20 years ago). CT revealed mid/distal SBO, NG tube placed 6/7. MRI revealed R cerebellar infarct. PMH includes: DM II, HLD, PVD, PD, PAF, CKD III, colon cancer, and BPH.    PT Comments    The pt was eager to participate in session with focus on progression of OOB mobility. Pt found to be soiled in bed with no awareness of situation, able to follow simple cues to complete bed mobility with modA of 1. The pt was able to maintain static standing with slight improvement in posterior lean noted initially, after 1-2 min of static stance, BP taken and was 78/39 (47) with pt reporting no sx. Pt then transitioned to sitting in recliner where repeat BP remained 772/33 (47) with pt in seated position. After continued seated rest, BP returned to 94/55 (64) and 105/57 (71). When pt asked again if he felt any sx such as dizziness, reports he had felt progressive dizziness in standing that he had not mentioned at the time. Will continue to benefit from skilled PT to progress functional strength, stability, and activity tolerance. Continue to recommend acute inpatient rehab when medically stable.   VITALS:  - supine in bed- BP: 110/63 (77);  - standing - BP: 78/39 (44); - seated after transfer to recliner - BP: 72/33 (47); - seated after 3 min - 105/57 (71); - seated at end of session - 106/84 (91)     Recommendations for follow up therapy are one component of a multi-disciplinary discharge planning process, led by the attending physician.  Recommendations may be updated based on patient status, additional functional criteria and insurance authorization.  Follow Up  Recommendations  Acute inpatient rehab (3hours/day)     Assistance Recommended at Discharge Frequent or constant Supervision/Assistance  Patient can return home with the following Two people to help with walking and/or transfers;A lot of help with bathing/dressing/bathroom;Assistance with cooking/housework;Direct supervision/assist for medications management;Direct supervision/assist for financial management;Assist for transportation;Help with stairs or ramp for entrance   Equipment Recommendations  Other (comment) (defer to post acute)    Recommendations for Other Services Rehab consult     Precautions / Restrictions Precautions Precautions: Fall Precaution Comments: pt reports 3-4 falls in last 6 months, orthostatic Restrictions Weight Bearing Restrictions: No     Mobility  Bed Mobility Overal bed mobility: Needs Assistance Bed Mobility: Supine to Sit     Supine to sit: Mod assist     General bed mobility comments: modA to manage LE and to elevate trunk, pt falling to back and L with sitting    Transfers Overall transfer level: Needs assistance Equipment used: Rolling walker (2 wheels) Transfers: Sit to/from Stand, Bed to chair/wheelchair/BSC Sit to Stand: Min assist, +2 physical assistance   Step pivot transfers: Mod assist, +2 physical assistance       General transfer comment: min A of 2 to come into standing, cues for hand placement, able to stand for cleaning 1-2 min. progressive posterior lean with fatigue. mod A of 2 to step pivot to recliner due to low BP numbers (78/39)    Ambulation/Gait               General  Gait Details: limited to small lateral shuffling steps, minimal clearance even with verbal cues. no buckling, minA to maintain upright and direct.    Modified Rankin (Stroke Patients Only) Modified Rankin (Stroke Patients Only) Pre-Morbid Rankin Score: Moderately severe disability Modified Rankin: Moderately severe disability      Balance Overall balance assessment: Needs assistance Sitting-balance support: Bilateral upper extremity supported, Feet supported Sitting balance-Leahy Scale: Fair Sitting balance - Comments: able to progress to minG without LOB in static sitting Postural control: Posterior lean Standing balance support: Reliant on assistive device for balance, Bilateral upper extremity supported, During functional activity Standing balance-Leahy Scale: Poor Standing balance comment: dependent on BUE support and assist, up to modA due to posterior lean                            Cognition Arousal/Alertness: Awake/alert Behavior During Therapy: Flat affect Overall Cognitive Status: No family/caregiver present to determine baseline cognitive functioning Area of Impairment: Following commands, Safety/judgement, Awareness, Problem solving                       Following Commands: Follows one step commands inconsistently, Follows one step commands with increased time Safety/Judgement: Decreased awareness of safety, Decreased awareness of deficits Awareness: Intellectual Problem Solving: Slow processing, Decreased initiation, Difficulty sequencing, Requires verbal cues General Comments: patient in wet linen and did not notice, patient also not reporting feeling dizzy resulting in significantly low BP in standing until asked again at end of session        Exercises      General Comments General comments (skin integrity, edema, etc.): BP 110/63 (77) in supine at start of session, to 78/39 (44) and 72/33 (47) in standing and after pivot to recliner      Pertinent Vitals/Pain Pain Assessment Pain Assessment: No/denies pain     PT Goals (current goals can now be found in the care plan section) Acute Rehab PT Goals Patient Stated Goal: none stated PT Goal Formulation: Patient unable to participate in goal setting Time For Goal Achievement: 03/09/22 Potential to Achieve Goals:  Good Progress towards PT goals: Progressing toward goals    Frequency    Min 4X/week      PT Plan Current plan remains appropriate    Co-evaluation PT/OT/SLP Co-Evaluation/Treatment: Yes Reason for Co-Treatment: Complexity of the patient's impairments (multi-system involvement);For patient/therapist safety;To address functional/ADL transfers PT goals addressed during session: Mobility/safety with mobility;Balance;Proper use of DME;Strengthening/ROM        AM-PAC PT "6 Clicks" Mobility   Outcome Measure  Help needed turning from your back to your side while in a flat bed without using bedrails?: A Lot Help needed moving from lying on your back to sitting on the side of a flat bed without using bedrails?: A Lot Help needed moving to and from a bed to a chair (including a wheelchair)?: A Lot Help needed standing up from a chair using your arms (e.g., wheelchair or bedside chair)?: A Lot Help needed to walk in hospital room?: Total (unable to walk 20 ft) Help needed climbing 3-5 steps with a railing? : Total 6 Click Score: 10    End of Session Equipment Utilized During Treatment: Gait belt Activity Tolerance: Treatment limited secondary to medical complications (Comment) (soft BP) Patient left: in chair;with call bell/phone within reach;with chair alarm set Nurse Communication: Mobility status;Other (comment) (BP) PT Visit Diagnosis: Other abnormalities of gait and mobility (R26.89);Muscle weakness (generalized) (M62.81);History  of falling (Z91.81)     Time: 9935-7017 PT Time Calculation (min) (ACUTE ONLY): 29 min  Charges:  $Therapeutic Exercise: 8-22 mins                     West Carbo, PT, DPT   Acute Rehabilitation Department   Sandra Cockayne 02/24/2022, 5:56 PM

## 2022-02-24 NOTE — Progress Notes (Addendum)
Subjective: No complaints today.  Passing flatus.  No BM currently.  Denies any abdominal pain  ROS: See above, otherwise other systems negative  Objective: Vital signs in last 24 hours: Temp:  [97.6 F (36.4 C)-98.2 F (36.8 C)] 97.6 F (36.4 C) (06/09 0458) Pulse Rate:  [69-82] 78 (06/08 2209) Resp:  [17-20] 17 (06/09 0458) BP: (89-110)/(55-68) 104/55 (06/09 0536) SpO2:  [96 %-98 %] 98 % (06/09 0458) Weight:  [68.8 kg] 68.8 kg (06/09 0458) Last BM Date : 02/24/22  Intake/Output from previous day: 06/08 0701 - 06/09 0700 In: 1615.5 [P.O.:270; I.V.:1245.5; IV Piggyback:100] Out: 1450 [Urine:150; Emesis/NG output:1300] Intake/Output this shift: No intake/output data recorded.  PE: Abd: soft, completely nontender, mild distention as abdominal wall very thin and bowels are palpable.  NGT with 1300cc yesterday, but currently with no output even after flushing.  Lab Results:  Recent Labs    02/22/22 0847 02/23/22 0305  WBC 3.6* 4.6  HGB 12.4* 11.7*  HCT 36.8* 35.1*  PLT 166 159   BMET Recent Labs    02/22/22 0847 02/23/22 0305  NA 140 141  K 4.0 3.8  CL 101 107  CO2 26 25  GLUCOSE 189* 141*  BUN 100* 93*  CREATININE 2.47* 2.50*  CALCIUM 9.3 8.2*   PT/INR No results for input(s): "LABPROT", "INR" in the last 72 hours. CMP     Component Value Date/Time   NA 141 02/23/2022 0305   K 3.8 02/23/2022 0305   CL 107 02/23/2022 0305   CO2 25 02/23/2022 0305   GLUCOSE 141 (H) 02/23/2022 0305   BUN 93 (H) 02/23/2022 0305   CREATININE 2.50 (H) 02/23/2022 0305   CALCIUM 8.2 (L) 02/23/2022 0305   PROT 6.8 02/22/2022 0847   ALBUMIN 3.4 (L) 02/22/2022 0847   AST 18 02/22/2022 0847   ALT 18 02/22/2022 0847   ALKPHOS 55 02/22/2022 0847   BILITOT 1.2 02/22/2022 0847   GFRNONAA 23 (L) 02/23/2022 0305   GFRAA  04/26/2009 0920    >60        The eGFR has been calculated using the MDRD equation. This calculation has not been validated in all  clinical situations. eGFR's persistently <60 mL/min signify possible Chronic Kidney Disease.   Lipase  No results found for: "LIPASE"     Studies/Results: DG Abd Portable 1V-Small Bowel Obstruction Protocol-initial, 8 hr delay  Result Date: 02/23/2022 CLINICAL DATA:  8 hour small-bowel follow-up film EXAM: PORTABLE ABDOMEN - 1 VIEW COMPARISON:  Film from earlier in the same day. FINDINGS: Gastric catheter is again seen in the stomach. Changes of small-bowel obstruction are again noted. Previously administered contrast again lies within the small bowel without significant colonic contrast. Continued follow-up is recommended. IMPRESSION: Persistent small bowel obstruction with contrast in the small bowel. Continued follow-up is recommended. Electronically Signed   By: Inez Catalina M.D.   On: 02/23/2022 19:58   DG Abd 1 View  Result Date: 02/23/2022 CLINICAL DATA:  Check gastric catheter placement EXAM: ABDOMEN - 1 VIEW COMPARISON:  Film from earlier in the same day. FINDINGS: Small bowel dilatation is noted which is slightly greater than that seen on the prior exam consistent with small bowel obstruction. Gastric catheter remains in satisfactory position. Contrast material was administered and lies within the stomach as well as the small bowel. No definitive colonic contrast is noted. Continued follow-up is recommended. IMPRESSION: Changes consistent with worsening small bowel obstruction. Gastric catheter is noted in place. Administered contrast  shows within the small bowel. No colonic contrast is seen at this time. Continued follow-up is recommended. Electronically Signed   By: Inez Catalina M.D.   On: 02/23/2022 19:28   ECHOCARDIOGRAM COMPLETE  Result Date: 02/23/2022    ECHOCARDIOGRAM REPORT   Patient Name:   Steven Ward Date of Exam: 02/23/2022 Medical Rec #:  [id number]        Height:       68.0 in Accession #:    [id number]       Weight:       144.0 lb Date of Birth:  Oct 21, 1924        BSA:           1.777 m Patient Age:    88 years         BP:           110/68 mmHg Patient Gender: M                HR:           88 bpm. Exam Location:  Inpatient Procedure: 2D Echo, Cardiac Doppler and Color Doppler Indications:    Stroke I63.9  History:        Patient has prior history of Echocardiogram examinations, most                 recent 07/19/2011. Risk Factors:GERD, Hypertension and Diabetes.  Sonographer:    Bernadene Person RDCS Referring Phys: [id number] [place]  1. Left ventricular ejection fraction, by estimation, is 55 to 60%. The left ventricle has normal function. The left ventricle has no regional wall motion abnormalities. There is mild concentric left ventricular hypertrophy. Left ventricular diastolic function could not be evaluated.  2. Right ventricular systolic function is normal. The right ventricular size is normal. There is normal pulmonary artery systolic pressure.  3. Left atrial size was severely dilated.  4. The mitral valve is normal in structure. Trivial mitral valve regurgitation. No evidence of mitral stenosis.  5. The aortic valve is calcified. There is mild calcification of the aortic valve. There is mild thickening of the aortic valve. Aortic valve regurgitation is not visualized. No aortic stenosis is present.  6. The inferior vena cava is normal in size with greater than 50% respiratory variability, suggesting right atrial pressure of 3 mmHg. FINDINGS  Left Ventricle: Left ventricular ejection fraction, by estimation, is 55 to 60%. The left ventricle has normal function. The left ventricle has no regional wall motion abnormalities. The left ventricular internal cavity size was normal in size. There is  mild concentric left ventricular hypertrophy. Left ventricular diastolic function could not be evaluated due to atrial fibrillation. Left ventricular diastolic function could not be evaluated. Right Ventricle: The right ventricular size is normal. No increase in  right ventricular wall thickness. Right ventricular systolic function is normal. There is normal pulmonary artery systolic pressure. The tricuspid regurgitant velocity is 2.08 m/s, and  with an assumed right atrial pressure of 3 mmHg, the estimated right ventricular systolic pressure is 21.1 mmHg. Left Atrium: Left atrial size was severely dilated. Right Atrium: Right atrial size was normal in size. Pericardium: There is no evidence of pericardial effusion. Mitral Valve: The mitral valve is normal in structure. Mild mitral annular calcification. Trivial mitral valve regurgitation. No evidence of mitral valve stenosis. MV peak gradient, 5.2 mmHg. The mean mitral valve gradient is 2.0 mmHg. Tricuspid Valve: The tricuspid valve is normal in structure. Tricuspid valve regurgitation is trivial. No  evidence of tricuspid stenosis. Aortic Valve: The aortic valve is calcified. There is mild calcification of the aortic valve. There is mild thickening of the aortic valve. Aortic valve regurgitation is not visualized. No aortic stenosis is present. Pulmonic Valve: The pulmonic valve was normal in structure. Pulmonic valve regurgitation is not visualized. No evidence of pulmonic stenosis. Aorta: The aortic root is normal in size and structure. Venous: The inferior vena cava is normal in size with greater than 50% respiratory variability, suggesting right atrial pressure of 3 mmHg. IAS/Shunts: No atrial level shunt detected by color flow Doppler.  LEFT VENTRICLE PLAX 2D LVIDd:         4.30 cm LVIDs:         3.00 cm LV PW:         1.30 cm LV IVS:        1.20 cm LVOT diam:     2.00 cm LV SV:         72 LV SV Index:   40 LVOT Area:     3.14 cm  LV Volumes (MOD) LV vol d, MOD A2C: 74.3 ml LV vol d, MOD A4C: 60.6 ml LV vol s, MOD A2C: 27.9 ml LV vol s, MOD A4C: 22.0 ml LV SV MOD A2C:     46.4 ml LV SV MOD A4C:     60.6 ml LV SV MOD BP:      44.7 ml RIGHT VENTRICLE RV S prime:     9.98 cm/s TAPSE (M-mode): 1.8 cm LEFT ATRIUM              Index        RIGHT ATRIUM           Index LA diam:        3.90 cm 2.19 cm/m   RA Area:     17.00 cm LA Vol (A2C):   78.9 ml 44.40 ml/m  RA Volume:   42.40 ml  23.86 ml/m LA Vol (A4C):   84.7 ml 47.66 ml/m LA Biplane Vol: 90.5 ml 50.92 ml/m  AORTIC VALVE LVOT Vmax:   121.00 cm/s LVOT Vmean:  84.733 cm/s LVOT VTI:    0.228 m  AORTA Ao Root diam: 3.40 cm Ao Asc diam:  3.10 cm MITRAL VALVE              TRICUSPID VALVE MV Area (PHT): 2.47 cm   TR Peak grad:   17.3 mmHg MV Area VTI:   2.41 cm   TR Vmax:        208.00 cm/s MV Peak grad:  5.2 mmHg MV Mean grad:  2.0 mmHg   SHUNTS MV Vmax:       1.14 m/s   Systemic VTI:  0.23 m MV Vmean:      53.5 cm/s  Systemic Diam: 2.00 cm Skeet Latch MD Electronically signed by Skeet Latch MD Signature Date/Time: 02/23/2022/4:19:50 PM    Final    DG Abd Portable 1V-Small Bowel Protocol-Position Verification  Result Date: 02/23/2022 CLINICAL DATA:  Nasogastric tube placement EXAM: PORTABLE ABDOMEN - 1 VIEW COMPARISON:  Radiograph upper abdomen earlier same day FINDINGS: Interval advancement of nasogastric tube with side port just distal to the gastroesophageal junction and tip overlying the stomach in left upper quadrant. Air is again seen within dilated loops of small bowel, noting a right lower quadrant transition point on yesterday's CT indicating mid to distal small bowel obstruction. Cholecystectomy clips. IMPRESSION: 1. Nasogastric tube in appropriate position. 2. Persistent dilated  loops of small bowel as on yesterday's CT that demonstrated small-bowel obstruction status post in this patient with history of colectomy. Electronically Signed   By: Yvonne Kendall M.D.   On: 02/23/2022 08:37   DG CHEST PORT 1 VIEW  Result Date: 02/23/2022 CLINICAL DATA:  Check gastric catheter placement EXAM: PORTABLE CHEST 1 VIEW COMPARISON:  Abdomen film from earlier in the same day. FINDINGS: Gastric catheter now extends mildly into the stomach. No looping is noted within the  esophagus. Cardiac shadow is within normal limits. Calcified granulomas are noted bilaterally. IMPRESSION: Gastric catheter now noted within the stomach. No looping in the esophagus is seen. Electronically Signed   By: Inez Catalina M.D.   On: 02/23/2022 03:21   DG Abd 1 View  Result Date: 02/23/2022 CLINICAL DATA:  Check gastric catheter placement EXAM: ABDOMEN - 1 VIEW COMPARISON:  Film from earlier in the same day FINDINGS: Gastric catheter is stable in appearance when compared with the prior exam it should be advanced deeper into the stomach. If significant advancement has been performed, chest x-ray is recommended to evaluate for possible looping within the proximal esophagus. IMPRESSION: Gastric catheter is stable from the prior exam. Chest x-ray may be helpful as described above. Electronically Signed   By: Inez Catalina M.D.   On: 02/23/2022 02:34   DG Abd 1 View  Result Date: 02/23/2022 CLINICAL DATA:  Check gastric catheter placement EXAM: ABDOMEN - 1 VIEW COMPARISON:  None Available. FINDINGS: Scattered large and small bowel gas is noted. Gastric catheter is noted with the tip at the expected level of the gastroesophageal junction. This should be advanced deeper into the stomach. IMPRESSION: Gastric catheter at the expected location of the gastroesophageal junction. This should be advanced deeper into the stomach. Electronically Signed   By: Inez Catalina M.D.   On: 02/23/2022 00:31   MR BRAIN WO CONTRAST  Result Date: 02/22/2022 CLINICAL DATA:  Acute neurologic deficit EXAM: MRI HEAD WITHOUT CONTRAST TECHNIQUE: Multiplanar, multiecho pulse sequences of the brain and surrounding structures were obtained without intravenous contrast. COMPARISON:  None Available. FINDINGS: Brain: Punctate acute infarct within the right cerebellar hemisphere. There is mildly hyperintense signal on diffusion-weighted imaging in subcortical right frontal lobe without a clear ADC correlate. No acute or chronic hemorrhage.  There is multifocal hyperintense T2-weighted signal within the white matter. Generalized cerebral volume loss. The midline structures are normal. Vascular: Major flow voids are preserved. Skull and upper cervical spine: Normal calvarium and skull base. Visualized upper cervical spine and soft tissues are normal. Sinuses/Orbits:Right mastoid effusion. Paranasal sinuses are clear. Normal orbits. IMPRESSION: 1. Punctate acute infarct within the right cerebellar hemisphere. No hemorrhage or mass effect. 2. Mildly hyperintense signal on diffusion-weighted imaging in the subcortical right frontal lobe without a clear ADC correlate. This may indicate a subacute infarct. 3. Chronic small vessel ischemic disease and cerebral volume loss. Electronically Signed   By: Ulyses Jarred M.D.   On: 02/22/2022 21:46   CT CHEST ABDOMEN PELVIS WO CONTRAST  Result Date: 02/22/2022 CLINICAL DATA:  Inpatient. Abdominal distension. Poor p.o. intake. Generalized weakness. Or abnormal chest radiograph with possible pulmonary nodule. Remote history of resected left colon cancer in 1993 per prior report. * Tracking Code: BO * EXAM: CT CHEST, ABDOMEN AND PELVIS WITHOUT CONTRAST TECHNIQUE: Multidetector CT imaging of the chest, abdomen and pelvis was performed following the standard protocol without IV contrast. RADIATION DOSE REDUCTION: This exam was performed according to the departmental dose-optimization program which includes automated  exposure control, adjustment of the mA and/or kV according to patient size and/or use of iterative reconstruction technique. COMPARISON:  Chest radiograph from earlier today. 06/30/2009 CT abdomen/pelvis. FINDINGS: CT CHEST FINDINGS Cardiovascular: Normal heart size. No significant pericardial effusion/thickening. Three-vessel coronary atherosclerosis. Atherosclerotic nonaneurysmal thoracic aorta. Normal caliber pulmonary arteries. Mediastinum/Nodes: No discrete thyroid nodules. Unremarkable esophagus. No  pathologically enlarged axillary, mediastinal or hilar lymph nodes, noting limited sensitivity for the detection of hilar adenopathy on this noncontrast study. Lungs/Pleura: No pneumothorax. Scattered calcified bilateral pleural plaques anteriorly and posteriorly. Trace bilateral pleural effusions, right greater than left. Ovoid 3.5 x 2.4 cm subpleural focus of consolidation in the dependent basilar right lower lobe (series 5/image 103), new from 2010 CT. Patchy confluent subpleural reticulation and ground-glass opacity with associated mild traction bronchiolectasis at both lung bases, mildly increased from prior CT. Mild-to-moderate patchy tree-in-bud opacities in the dependent basilar right upper lobe. No additional significant pulmonary nodules. Musculoskeletal: No aggressive appearing focal osseous lesions. Healed anterolateral right seventh through tenth rib fractures. Moderate T5 vertebral compression fracture of indeterminate chronicity and chronic severe T12 vertebral compression fracture. Mild thoracic spondylosis. CT ABDOMEN PELVIS FINDINGS Hepatobiliary: Normal liver with no liver mass. Cholecystectomy. Bile ducts are within normal post cholecystectomy limits with CBD diameter 7 mm. Pancreas: Progressive coarse calcifications throughout the atrophic pancreas with irregular dilated main pancreatic duct up to 7 mm diameter, compatible with chronic pancreatitis. No discrete pancreatic mass. No peripancreatic fluid collections. Spleen: Normal size. No mass. Adrenals/Urinary Tract: Normal adrenals. No renal stones. No hydronephrosis. Simple 1.2 cm posterior upper left renal cyst for which no follow-up is recommended. No additional contour deforming renal masses. Normal bladder. Stomach/Bowel: Mildly distended stomach with air-fluid level. No gastric wall thickening. Diffuse dilatation of the proximal to mid small bowel with air-fluid levels, measuring up to 4.8 cm diameter. Distal small bowel is collapsed.  Apparent focal small bowel caliber transition in the right abdomen (series 6/image 38). There is associated fat stranding and ill-defined fluid throughout the small bowel mesentery. There is some mesenteric twisting at the site of the caliber transition. No definite bowel wall thickening or pneumatosis. Normal appendix. Postsurgical change from partial distal colectomy with intact appearing colorectal anastomosis. No large bowel wall thickening, diverticulosis or significant pericolonic fat stranding. Vascular/Lymphatic: Atherosclerotic nonaneurysmal abdominal aorta. No pathologically enlarged lymph nodes in the abdomen or pelvis. Reproductive: Normal size prostate. No pneumoperitoneum. Small volume ascites. No focal fluid collection. Other: No pneumoperitoneum, ascites or focal fluid collection. Surgical clips noted throughout the midline ventral abdominal wall. Musculoskeletal: No aggressive appearing focal osseous lesions. Marked lumbar spondylosis. IMPRESSION: 1. Mechanical mid to distal small-bowel obstruction with apparent focal small bowel caliber transition in the right abdomen, with associated fat stranding and ill-defined fluid throughout the small bowel mesentery and with some mesenteric twisting, raising the possibility of internal hernia. No definite bowel wall thickening. No pneumatosis. No free air. 2. Small volume ascites. 3. Bilateral calcified pleural plaques and trace bilateral pleural effusions, compatible with asbestos related pleural disease. Basilar predominant pulmonary fibrosis compatible with asbestosis. 4. Ovoid 3.5 cm subpleural focus of consolidation in the dependent basilar right lower lobe, new from 2010 CT, indeterminate for rounded atelectasis versus pulmonary neoplasm. Suggest attention on follow-up chest CT in 3 months. 5. Mild-to-moderate patchy tree-in-bud opacities in the dependent basilar right upper lobe, compatible with nonspecific infectious or inflammatory bronchiolitis,  with the differential including aspiration. 6. Chronic pancreatitis. 7. Three-vessel coronary atherosclerosis. 8. Moderate T5 vertebral compression fracture of indeterminate chronicity and  chronic severe T12 vertebral compression fracture. 9. Aortic Atherosclerosis (ICD10-I70.0). These results will be called to the ordering clinician or representative by the Radiologist Assistant, and communication documented in the PACS or [employer]. Electronically Signed   By: Ilona Sorrel M.D.   On: 02/22/2022 21:39   DG Chest Port 1 View  Result Date: 02/22/2022 CLINICAL DATA:  Shortness of breath.  Weakness and fatigue. EXAM: PORTABLE CHEST 1 VIEW COMPARISON:  06/27/2011 FINDINGS: Suspected calcified granuloma in the left upper lobe, also present back on 2012. Indistinct 4.3 by 1.6 cm left upper lobe bandlike density, possibly at least partially from superimposition of vascular shadows and the first rib end, but an underlying pulmonary nodule cannot be readily excluded. Atherosclerotic calcification of the aortic arch. Upper normal heart size given the AP technique. Linear subsegmental atelectasis or scarring at the lung bases. Mitral valve calcification. Right lower lateral rib deformities including the ninth rib, compatible with old healed fractures. Subtle interstitial accentuation in the lungs is nonspecific. IMPRESSION: 1. Indistinct bandlike density at the left lung apex, possibly from scarring or atelectasis or confluence of vascular and osseous shadows, but an underlying pulmonary nodule cannot be excluded and accordingly CT of the chest is recommended. 2. Mitral valve calcification. Aortic Atherosclerosis (ICD10-I70.0). 3. Old granulomatous disease. 4. Mild bibasilar scarring. 5. Interval but healed right lower lateral rib fractures. 6. Faint interstitial accentuation in the lungs, nonspecific. Electronically Signed   By: Van Clines M.D.   On: 02/22/2022 09:41   CT Head Wo Contrast  Result Date:  02/22/2022 CLINICAL DATA:  Neuro deficit, acute, stroke suspected EXAM: CT HEAD WITHOUT CONTRAST TECHNIQUE: Contiguous axial images were obtained from the base of the skull through the vertex without intravenous contrast. RADIATION DOSE REDUCTION: This exam was performed according to the departmental dose-optimization program which includes automated exposure control, adjustment of the mA and/or kV according to patient size and/or use of iterative reconstruction technique. COMPARISON:  None Available. FINDINGS: Brain: No evidence of acute large vascular territory infarction, hemorrhage, hydrocephalus, extra-axial collection or mass lesion/mass effect. Patchy white matter hypodensities, nonspecific but compatible with chronic microvascular disease. Cerebral atrophy. Vascular: No hyperdense vessel identified. Calcific intracranial atherosclerosis. Skull: No acute fracture. Sinuses/Orbits: Minimal paranasal sinus mucosal thickening. No acute orbital findings. Other: Trace right mastoid effusion. IMPRESSION: No evidence of acute intracranial abnormality. Electronically Signed   By: Margaretha Sheffield M.D.   On: 02/22/2022 08:57    Anti-infectives: Anti-infectives (From admission, onward)    Start     Dose/Rate Route Frequency Ordered Stop   02/23/22 0000  cefTRIAXone (ROCEPHIN) 2 g in sodium chloride 0.9 % 100 mL IVPB        2 g 200 mL/hr over 30 Minutes Intravenous Daily at 10 pm 02/22/22 2346     02/23/22 0000  metroNIDAZOLE (FLAGYL) IVPB 500 mg  Status:  Discontinued        500 mg 100 mL/hr over 60 Minutes Intravenous 2 times daily 02/22/22 2346 02/23/22 1802        Assessment/Plan SBO -protocol shows contrast in the colon despite small bowel still being dilated.  Sometimes this can lag.  He is passing flatus with a soft abdomen, no pain, and currently with minimal NGT output.  Will clamp his NGT today and try clear liquids and see how he does.  If he develops n/v, return NGT to [facility] and make  NPO -would obviously like to avoid surgery if able given age, comorbidities, and new CVA -mobilize as able -  pulm toilet   FEN - CLD/NGT clamped/IVFs per [facility] VTE - ok for chemical prophylaxis from our standpoint ID - none  CVA DM HTN Pancreatic insufficiency A fib Parkinson's disease Hypothyroidism BPH Pulmonary fibrosis/RLL mass  I reviewed Consultant neuro notes, hospitalist notes, last 24 h vitals and pain scores, last 48 h intake and output, last 24 h labs and trends, and last 24 h imaging results.   LOS: 1 day    [name] , [facility] 02/24/2022, 8:18 AM Please see Amion for pager number during day hours 7:00am-4:30pm or 7:00am -11:30am on weekends

## 2022-02-24 NOTE — Progress Notes (Signed)
PROGRESS NOTE    KROSS SWALLOWS  IWD:[id number] DOB: 1924/11/01 DOA: 02/22/2022 PCP: Kristian Covey, MD   Brief Narrative: Steven Ward is a 88 y.o. male with medical history significant of paroxysmal A-fib not on anticoagulation, hypertension, hypothyroidism, Parkinson's disease, lumbar spondylosis, type 2 diabetes, hyperlipidemia, PVD, CKD stage IIIb, remote history of colon cancer, BPH, pancreatic insufficiency, gallstones, GERD. Patient presented secondary to generalized weakness and found to have evidence of atrial fibrillation, SBO and acute infarct. General surgery consulted; NPO with NG tube placed. Cardiology consulted and patient's rate managed with beta blocker. Neurology consulted for stroke.   Assessment and Plan:  Paroxysmal atrial fibrillation Rate controlled. Cardiology consulted. Transthoracic Echocardiogram ordered and significant for severely dilated left atrium with normal LVEF. Anticoagulation deferred secondary to acute infarct, per cardiology. Started on metoprolol PO -Cardiology recommendations: Metoprolol -Start Eliquis once okay per general surgery  Small bowel obstruction Noted on CT abdomen/pelvis on 6/7. General surgery consulted. NG tube placed and intermittent suction applied. NPO. -General surgery recommendations: SBO protocol  AKI on CKD stage IIIb Baseline creatinine of about 1.7-2. Creatinine of 2.47 on admission with peak of 2.5. Started on IV fluids -Continue IV fluids  Acute CVA Acute punctate infarct noted on MRI located within the right cerebellar hemisphere in addition to possible subacute infarct of right frontal lobe. LDL of 34. Hemoglobin A1C of 7.7%. Transthoracic Echocardiogram with normal LVEF of 55-60% with no evidence of atrial level shunt. -Neurology recommendations: Aspirin 81 mg, Eliquis -PT/OT recommending CIR -SLP pending  Generalized weakness PT recommending CIR. Consult placed.  Primary hypertension Patient is on  hydrochlorothiazide and Lasix as an outpatient. Started on metoprolol inpatient. -Continue metoprolol  Right upper lobe opacities Concerning for possible infection. Asymptomatic at this time. Started on Ceftriaxone and Flagyl for CAP/aspiration pneumonia -Continue Ceftriaxone  Diabetes mellitus, type 2 Hemoglobin A1C of 7.7%. Patient on glipizide.  Parkinson disease -Continue Sinemet CR  Hypothyroidism -Continue Synthroid  BPH -Continue tamsulosin  Chronic pancreatic insufficiency -Continue Creon  T5 and T12 vertebral compression fracture Incidental finding. Chronic.   Aortic atherosclerosis Noted on CT imaging.  Bilateral pleural plaques Pulmonary fibrosis Per CT read, consistent with asbestos related pleural disease.  3.5 cm RLL mass Newly noted. Rounded atelectasis vs pulmonary neoplasm. Recommendation for follow-up in 3 months  Pressure injury Right perineum, POA. -Wound care per nursing    DVT prophylaxis: SCDs Code Status:   Code Status: DNR Family Communication: None at bedside Disposition Plan: Discharge possibly to CIR pending management of SBO   Consultants:  General surgery Cardiology Neurology  Procedures:  None  Antimicrobials: Ceftriaxone Flagyl    Subjective: No issues this morning. Passing gas. No abdominal pain.  Objective: BP (!) 104/55   Pulse 78   Temp 97.6 F (36.4 C) (Oral)   Resp 17   Ht 5\' 8"  (1.727 m)   Wt 68.8 kg   SpO2 98%   BMI 23.06 kg/m   Examination:  General exam: Appears calm and comfortable Respiratory system: Clear to auscultation. Respiratory effort normal. Cardiovascular system: S1 & S2 heard Gastrointestinal system: Abdomen is distended, soft and non-tender. Normal bowel sounds heard. Central nervous system: Alert and oriented. No focal neurological deficits. Musculoskeletal: No edema. No calf tenderness Skin: No cyanosis. No rashes Psychiatry: Judgement and insight appear normal. Mood & affect  appropriate.    Data Reviewed: I have personally reviewed following labs and imaging studies  CBC Lab Results  Component Value Date   WBC 4.6 02/23/2022  RBC 3.61 (L) 02/23/2022   HGB 11.7 (L) 02/23/2022   HCT 35.1 (L) 02/23/2022   MCV 97.2 02/23/2022   MCH 32.4 02/23/2022   PLT 159 02/23/2022   MCHC 33.3 02/23/2022   RDW 14.2 02/23/2022   LYMPHSABS 0.8 02/22/2022   MONOABS 0.7 02/22/2022   EOSABS 0.0 02/22/2022   BASOSABS 0.0 84/13/2440     Last metabolic panel Lab Results  Component Value Date   NA 145 02/24/2022   K 3.2 (L) 02/24/2022   CL 110 02/24/2022   CO2 20 (L) 02/24/2022   BUN 102 (H) 02/24/2022   CREATININE 2.79 (H) 02/24/2022   GLUCOSE 124 (H) 02/24/2022   GFRNONAA 20 (L) 02/24/2022   GFRAA  04/26/2009    >60        The eGFR has been calculated using the MDRD equation. This calculation has not been validated in all clinical situations. eGFR's persistently <60 mL/min signify possible Chronic Kidney Disease.   CALCIUM 8.1 (L) 02/24/2022   PROT 6.8 02/22/2022   ALBUMIN 3.4 (L) 02/22/2022   BILITOT 1.2 02/22/2022   ALKPHOS 55 02/22/2022   AST 18 02/22/2022   ALT 18 02/22/2022   ANIONGAP 15 02/24/2022    GFR: Estimated Creatinine Clearance: 15.3 mL/min (A) (by C-G formula based on SCr of 2.79 mg/dL (H)).  No results found for this or any previous visit (from the past 240 hour(s)).    Radiology Studies: VAS US CAROTID  Result Date: 02/24/2022 Carotid Arterial Duplex Study Patient Name:  Steven Ward  Date of Exam:   02/24/2022 Medical Rec #: [id number]         Accession #:    [id number] Date of Birth: 06-Jun-1925         Patient Gender: M Patient Age:   17 years Exam Location:  [facility] Procedure:      VAS US CAROTID Referring Phys: Beulah Gandy --------------------------------------------------------------------------------  Indications:       CVA. Risk Factors:      Hypertension, Diabetes, past history of smoking, prior CVA. Comparison  Study:  No previous study to compare. Performing Technologist: Bobetta Lime  Examination Guidelines: A complete evaluation includes B-mode imaging, spectral Doppler, color Doppler, and power Doppler as needed of all accessible portions of each vessel. Bilateral testing is considered an integral part of a complete examination. Limited examinations for reoccurring indications may be performed as noted.  Right Carotid Findings: +----------+--------+--------+--------+------------------+--------+           PSV cm/sEDV cm/sStenosisPlaque DescriptionComments +----------+--------+--------+--------+------------------+--------+ CCA Prox  81      9                                          +----------+--------+--------+--------+------------------+--------+ CCA Distal36      12                                         +----------+--------+--------+--------+------------------+--------+ ICA Prox  40      10              heterogenous               +----------+--------+--------+--------+------------------+--------+ ICA Distal55      17                                         +----------+--------+--------+--------+------------------+--------+  ECA       83      0                                          +----------+--------+--------+--------+------------------+--------+ +----------+--------+-------+----------------+-------------------+           PSV cm/sEDV cmsDescribe        Arm Pressure (mmHG) +----------+--------+-------+----------------+-------------------+ Subclavian109            Multiphasic, WNL                    +----------+--------+-------+----------------+-------------------+ +---------+--------+--+--------+-+---------+ VertebralPSV cm/s32EDV cm/s6Antegrade +---------+--------+--+--------+-+---------+  Left Carotid Findings: +----------+--------+--------+--------+------------------+--------+           PSV cm/sEDV cm/sStenosisPlaque DescriptionComments  +----------+--------+--------+--------+------------------+--------+ CCA Prox  68      10                                         +----------+--------+--------+--------+------------------+--------+ CCA Distal64      11                                         +----------+--------+--------+--------+------------------+--------+ ICA Prox  63      12              heterogenous               +----------+--------+--------+--------+------------------+--------+ ICA Distal101     21                                         +----------+--------+--------+--------+------------------+--------+ ECA       78      0               heterogenous               +----------+--------+--------+--------+------------------+--------+ +----------+--------+--------+----------------+-------------------+           PSV cm/sEDV cm/sDescribe        Arm Pressure (mmHG) +----------+--------+--------+----------------+-------------------+ Subclavian                Multiphasic, WNL                    +----------+--------+--------+----------------+-------------------+ +---------+--------+--+--------+--+---------+ VertebralPSV cm/s46EDV cm/s11Antegrade +---------+--------+--+--------+--+---------+   Summary: Right Carotid: Velocities in the right ICA are consistent with a 1-39% stenosis. Left Carotid: Velocities in the left ICA are consistent with a 1-39% stenosis. Vertebrals:  Bilateral vertebral arteries demonstrate antegrade flow. Subclavians: Normal flow hemodynamics were seen in bilateral subclavian              arteries. *See table(s) above for measurements and observations.  Electronically signed by Antony Contras MD on 02/24/2022 at 12:18:10 PM.    Final    DG Abd 1 View  Result Date: 02/24/2022 CLINICAL DATA:  Small bowel delay EXAM: ABDOMEN - 1 VIEW COMPARISON:  02/23/2022 abdominal radiograph FINDINGS: Enteric tube terminates in the gastric fundus. Persistent moderate diffuse small bowel  dilatation, similar to slightly worsened. Oral contrast has progressed to the right and left colon. No evidence of pneumatosis or pneumoperitoneum. Vertical surgical clips again noted to the right of the lower spine. Marked lumbar spondylosis.  Cholecystectomy clips are seen in the right upper quadrant of the abdomen. IMPRESSION: 1. Enteric tube terminates in the gastric fundus. 2. Progression of oral contrast to the colon suggests improving distal small bowel obstruction, although the degree of diffuse small bowel dilatation appears slightly worsened. Electronically Signed   By: Ilona Sorrel M.D.   On: 02/24/2022 08:40   DG Abd Portable 1V-Small Bowel Obstruction Protocol-initial, 8 hr delay  Result Date: 02/23/2022 CLINICAL DATA:  8 hour small-bowel follow-up film EXAM: PORTABLE ABDOMEN - 1 VIEW COMPARISON:  Film from earlier in the same day. FINDINGS: Gastric catheter is again seen in the stomach. Changes of small-bowel obstruction are again noted. Previously administered contrast again lies within the small bowel without significant colonic contrast. Continued follow-up is recommended. IMPRESSION: Persistent small bowel obstruction with contrast in the small bowel. Continued follow-up is recommended. Electronically Signed   By: Inez Catalina M.D.   On: 02/23/2022 19:58   DG Abd 1 View  Result Date: 02/23/2022 CLINICAL DATA:  Check gastric catheter placement EXAM: ABDOMEN - 1 VIEW COMPARISON:  Film from earlier in the same day. FINDINGS: Small bowel dilatation is noted which is slightly greater than that seen on the prior exam consistent with small bowel obstruction. Gastric catheter remains in satisfactory position. Contrast material was administered and lies within the stomach as well as the small bowel. No definitive colonic contrast is noted. Continued follow-up is recommended. IMPRESSION: Changes consistent with worsening small bowel obstruction. Gastric catheter is noted in place. Administered contrast  shows within the small bowel. No colonic contrast is seen at this time. Continued follow-up is recommended. Electronically Signed   By: Inez Catalina M.D.   On: 02/23/2022 19:28   ECHOCARDIOGRAM COMPLETE  Result Date: 02/23/2022    ECHOCARDIOGRAM REPORT   Patient Name:   STEVON GOUGH Date of Exam: 02/23/2022 Medical Rec #:  [id number]        Height:       68.0 in Accession #:    [id number]       Weight:       144.0 lb Date of Birth:  [birth year]        BSA:          1.777 m Patient Age:    89 years         BP:           110/68 mmHg Patient Gender: M                HR:           88 bpm. Exam Location:  Inpatient Procedure: 2D Echo, Cardiac Doppler and Color Doppler Indications:    Stroke I63.9  History:        Patient has prior history of Echocardiogram examinations, most                 recent 07/19/2011. Risk Factors:GERD, Hypertension and Diabetes.  Sonographer:    Bernadene Person RDCS Referring Phys: [id number] [place]  1. Left ventricular ejection fraction, by estimation, is 55 to 60%. The left ventricle has normal function. The left ventricle has no regional wall motion abnormalities. There is mild concentric left ventricular hypertrophy. Left ventricular diastolic function could not be evaluated.  2. Right ventricular systolic function is normal. The right ventricular size is normal. There is normal pulmonary artery systolic pressure.  3. Left atrial size was severely dilated.  4. The mitral valve is normal in structure. Trivial mitral valve regurgitation.  No evidence of mitral stenosis.  5. The aortic valve is calcified. There is mild calcification of the aortic valve. There is mild thickening of the aortic valve. Aortic valve regurgitation is not visualized. No aortic stenosis is present.  6. The inferior vena cava is normal in size with greater than 50% respiratory variability, suggesting right atrial pressure of 3 mmHg. FINDINGS  Left Ventricle: Left ventricular ejection fraction, by  estimation, is 55 to 60%. The left ventricle has normal function. The left ventricle has no regional wall motion abnormalities. The left ventricular internal cavity size was normal in size. There is  mild concentric left ventricular hypertrophy. Left ventricular diastolic function could not be evaluated due to atrial fibrillation. Left ventricular diastolic function could not be evaluated. Right Ventricle: The right ventricular size is normal. No increase in right ventricular wall thickness. Right ventricular systolic function is normal. There is normal pulmonary artery systolic pressure. The tricuspid regurgitant velocity is 2.08 m/s, and  with an assumed right atrial pressure of 3 mmHg, the estimated right ventricular systolic pressure is 45.8 mmHg. Left Atrium: Left atrial size was severely dilated. Right Atrium: Right atrial size was normal in size. Pericardium: There is no evidence of pericardial effusion. Mitral Valve: The mitral valve is normal in structure. Mild mitral annular calcification. Trivial mitral valve regurgitation. No evidence of mitral valve stenosis. MV peak gradient, 5.2 mmHg. The mean mitral valve gradient is 2.0 mmHg. Tricuspid Valve: The tricuspid valve is normal in structure. Tricuspid valve regurgitation is trivial. No evidence of tricuspid stenosis. Aortic Valve: The aortic valve is calcified. There is mild calcification of the aortic valve. There is mild thickening of the aortic valve. Aortic valve regurgitation is not visualized. No aortic stenosis is present. Pulmonic Valve: The pulmonic valve was normal in structure. Pulmonic valve regurgitation is not visualized. No evidence of pulmonic stenosis. Aorta: The aortic root is normal in size and structure. Venous: The inferior vena cava is normal in size with greater than 50% respiratory variability, suggesting right atrial pressure of 3 mmHg. IAS/Shunts: No atrial level shunt detected by color flow Doppler.  LEFT VENTRICLE PLAX 2D  LVIDd:         4.30 cm LVIDs:         3.00 cm LV PW:         1.30 cm LV IVS:        1.20 cm LVOT diam:     2.00 cm LV SV:         72 LV SV Index:   40 LVOT Area:     3.14 cm  LV Volumes (MOD) LV vol d, MOD A2C: 74.3 ml LV vol d, MOD A4C: 60.6 ml LV vol s, MOD A2C: 27.9 ml LV vol s, MOD A4C: 22.0 ml LV SV MOD A2C:     46.4 ml LV SV MOD A4C:     60.6 ml LV SV MOD BP:      44.7 ml RIGHT VENTRICLE RV S prime:     9.98 cm/s TAPSE (M-mode): 1.8 cm LEFT ATRIUM             Index        RIGHT ATRIUM           Index LA diam:        3.90 cm 2.19 cm/m   RA Area:     17.00 cm LA Vol (A2C):   78.9 ml 44.40 ml/m  RA Volume:   42.40 ml  23.86 ml/m  LA Vol (A4C):   84.7 ml 47.66 ml/m LA Biplane Vol: 90.5 ml 50.92 ml/m  AORTIC VALVE LVOT Vmax:   121.00 cm/s LVOT Vmean:  84.733 cm/s LVOT VTI:    0.228 m  AORTA Ao Root diam: 3.40 cm Ao Asc diam:  3.10 cm MITRAL VALVE              TRICUSPID VALVE MV Area (PHT): 2.47 cm   TR Peak grad:   17.3 mmHg MV Area VTI:   2.41 cm   TR Vmax:        208.00 cm/s MV Peak grad:  5.2 mmHg MV Mean grad:  2.0 mmHg   SHUNTS MV Vmax:       1.14 m/s   Systemic VTI:  0.23 m MV Vmean:      53.5 cm/s  Systemic Diam: 2.00 cm Skeet Latch MD Electronically signed by Skeet Latch MD Signature Date/Time: 02/23/2022/4:19:50 PM    Final    DG Abd Portable 1V-Small Bowel Protocol-Position Verification  Result Date: 02/23/2022 CLINICAL DATA:  Nasogastric tube placement EXAM: PORTABLE ABDOMEN - 1 VIEW COMPARISON:  Radiograph upper abdomen earlier same day FINDINGS: Interval advancement of nasogastric tube with side port just distal to the gastroesophageal junction and tip overlying the stomach in left upper quadrant. Air is again seen within dilated loops of small bowel, noting a right lower quadrant transition point on yesterday's CT indicating mid to distal small bowel obstruction. Cholecystectomy clips. IMPRESSION: 1. Nasogastric tube in appropriate position. 2. Persistent dilated loops of small  bowel as on yesterday's CT that demonstrated small-bowel obstruction status post in this patient with history of colectomy. Electronically Signed   By: Yvonne Kendall M.D.   On: 02/23/2022 08:37   DG CHEST PORT 1 VIEW  Result Date: 02/23/2022 CLINICAL DATA:  Check gastric catheter placement EXAM: PORTABLE CHEST 1 VIEW COMPARISON:  Abdomen film from earlier in the same day. FINDINGS: Gastric catheter now extends mildly into the stomach. No looping is noted within the esophagus. Cardiac shadow is within normal limits. Calcified granulomas are noted bilaterally. IMPRESSION: Gastric catheter now noted within the stomach. No looping in the esophagus is seen. Electronically Signed   By: Inez Catalina M.D.   On: 02/23/2022 03:21   DG Abd 1 View  Result Date: 02/23/2022 CLINICAL DATA:  Check gastric catheter placement EXAM: ABDOMEN - 1 VIEW COMPARISON:  Film from earlier in the same day FINDINGS: Gastric catheter is stable in appearance when compared with the prior exam it should be advanced deeper into the stomach. If significant advancement has been performed, chest x-ray is recommended to evaluate for possible looping within the proximal esophagus. IMPRESSION: Gastric catheter is stable from the prior exam. Chest x-ray may be helpful as described above. Electronically Signed   By: Inez Catalina M.D.   On: 02/23/2022 02:34   DG Abd 1 View  Result Date: 02/23/2022 CLINICAL DATA:  Check gastric catheter placement EXAM: ABDOMEN - 1 VIEW COMPARISON:  None Available. FINDINGS: Scattered large and small bowel gas is noted. Gastric catheter is noted with the tip at the expected level of the gastroesophageal junction. This should be advanced deeper into the stomach. IMPRESSION: Gastric catheter at the expected location of the gastroesophageal junction. This should be advanced deeper into the stomach. Electronically Signed   By: Inez Catalina M.D.   On: 02/23/2022 00:31   MR BRAIN WO CONTRAST  Result Date:  02/22/2022 CLINICAL DATA:  Acute neurologic deficit EXAM: MRI HEAD WITHOUT  CONTRAST TECHNIQUE: Multiplanar, multiecho pulse sequences of the brain and surrounding structures were obtained without intravenous contrast. COMPARISON:  None Available. FINDINGS: Brain: Punctate acute infarct within the right cerebellar hemisphere. There is mildly hyperintense signal on diffusion-weighted imaging in subcortical right frontal lobe without a clear ADC correlate. No acute or chronic hemorrhage. There is multifocal hyperintense T2-weighted signal within the white matter. Generalized cerebral volume loss. The midline structures are normal. Vascular: Major flow voids are preserved. Skull and upper cervical spine: Normal calvarium and skull base. Visualized upper cervical spine and soft tissues are normal. Sinuses/Orbits:Right mastoid effusion. Paranasal sinuses are clear. Normal orbits. IMPRESSION: 1. Punctate acute infarct within the right cerebellar hemisphere. No hemorrhage or mass effect. 2. Mildly hyperintense signal on diffusion-weighted imaging in the subcortical right frontal lobe without a clear ADC correlate. This may indicate a subacute infarct. 3. Chronic small vessel ischemic disease and cerebral volume loss. Electronically Signed   By: Ulyses Jarred M.D.   On: 02/22/2022 21:46   CT CHEST ABDOMEN PELVIS WO CONTRAST  Result Date: 02/22/2022 CLINICAL DATA:  Inpatient. Abdominal distension. Poor p.o. intake. Generalized weakness. Or abnormal chest radiograph with possible pulmonary nodule. Remote history of resected left colon cancer in 1993 per prior report. * Tracking Code: BO * EXAM: CT CHEST, ABDOMEN AND PELVIS WITHOUT CONTRAST TECHNIQUE: Multidetector CT imaging of the chest, abdomen and pelvis was performed following the standard protocol without IV contrast. RADIATION DOSE REDUCTION: This exam was performed according to the departmental dose-optimization program which includes automated exposure control,  adjustment of the mA and/or kV according to patient size and/or use of iterative reconstruction technique. COMPARISON:  Chest radiograph from earlier today. 06/30/2009 CT abdomen/pelvis. FINDINGS: CT CHEST FINDINGS Cardiovascular: Normal heart size. No significant pericardial effusion/thickening. Three-vessel coronary atherosclerosis. Atherosclerotic nonaneurysmal thoracic aorta. Normal caliber pulmonary arteries. Mediastinum/Nodes: No discrete thyroid nodules. Unremarkable esophagus. No pathologically enlarged axillary, mediastinal or hilar lymph nodes, noting limited sensitivity for the detection of hilar adenopathy on this noncontrast study. Lungs/Pleura: No pneumothorax. Scattered calcified bilateral pleural plaques anteriorly and posteriorly. Trace bilateral pleural effusions, right greater than left. Ovoid 3.5 x 2.4 cm subpleural focus of consolidation in the dependent basilar right lower lobe (series 5/image 103), new from 2010 CT. Patchy confluent subpleural reticulation and ground-glass opacity with associated mild traction bronchiolectasis at both lung bases, mildly increased from prior CT. Mild-to-moderate patchy tree-in-bud opacities in the dependent basilar right upper lobe. No additional significant pulmonary nodules. Musculoskeletal: No aggressive appearing focal osseous lesions. Healed anterolateral right seventh through tenth rib fractures. Moderate T5 vertebral compression fracture of indeterminate chronicity and chronic severe T12 vertebral compression fracture. Mild thoracic spondylosis. CT ABDOMEN PELVIS FINDINGS Hepatobiliary: Normal liver with no liver mass. Cholecystectomy. Bile ducts are within normal post cholecystectomy limits with CBD diameter 7 mm. Pancreas: Progressive coarse calcifications throughout the atrophic pancreas with irregular dilated main pancreatic duct up to 7 mm diameter, compatible with chronic pancreatitis. No discrete pancreatic mass. No peripancreatic fluid  collections. Spleen: Normal size. No mass. Adrenals/Urinary Tract: Normal adrenals. No renal stones. No hydronephrosis. Simple 1.2 cm posterior upper left renal cyst for which no follow-up is recommended. No additional contour deforming renal masses. Normal bladder. Stomach/Bowel: Mildly distended stomach with air-fluid level. No gastric wall thickening. Diffuse dilatation of the proximal to mid small bowel with air-fluid levels, measuring up to 4.8 cm diameter. Distal small bowel is collapsed. Apparent focal small bowel caliber transition in the right abdomen (series 6/image 38). There is associated fat stranding and  ill-defined fluid throughout the small bowel mesentery. There is some mesenteric twisting at the site of the caliber transition. No definite bowel wall thickening or pneumatosis. Normal appendix. Postsurgical change from partial distal colectomy with intact appearing colorectal anastomosis. No large bowel wall thickening, diverticulosis or significant pericolonic fat stranding. Vascular/Lymphatic: Atherosclerotic nonaneurysmal abdominal aorta. No pathologically enlarged lymph nodes in the abdomen or pelvis. Reproductive: Normal size prostate. No pneumoperitoneum. Small volume ascites. No focal fluid collection. Other: No pneumoperitoneum, ascites or focal fluid collection. Surgical clips noted throughout the midline ventral abdominal wall. Musculoskeletal: No aggressive appearing focal osseous lesions. Marked lumbar spondylosis. IMPRESSION: 1. Mechanical mid to distal small-bowel obstruction with apparent focal small bowel caliber transition in the right abdomen, with associated fat stranding and ill-defined fluid throughout the small bowel mesentery and with some mesenteric twisting, raising the possibility of internal hernia. No definite bowel wall thickening. No pneumatosis. No free air. 2. Small volume ascites. 3. Bilateral calcified pleural plaques and trace bilateral pleural effusions, compatible  with asbestos related pleural disease. Basilar predominant pulmonary fibrosis compatible with asbestosis. 4. Ovoid 3.5 cm subpleural focus of consolidation in the dependent basilar right lower lobe, new from 2010 CT, indeterminate for rounded atelectasis versus pulmonary neoplasm. Suggest attention on follow-up chest CT in 3 months. 5. Mild-to-moderate patchy tree-in-bud opacities in the dependent basilar right upper lobe, compatible with nonspecific infectious or inflammatory bronchiolitis, with the differential including aspiration. 6. Chronic pancreatitis. 7. Three-vessel coronary atherosclerosis. 8. Moderate T5 vertebral compression fracture of indeterminate chronicity and chronic severe T12 vertebral compression fracture. 9. Aortic Atherosclerosis (ICD10-I70.0). These results will be called to the ordering clinician or representative by the Radiologist Assistant, and communication documented in the PACS or [employer]. Electronically Signed   By: Ilona Sorrel M.D.   On: 02/22/2022 21:39      LOS: 1 day    Cordelia Poche, MD [facility] 02/24/2022, 2:45 PM   If 7PM-7AM, please contact night-coverage [email or web address]

## 2022-02-24 NOTE — Progress Notes (Signed)
Inpatient Rehab Coordinator Note  Met with patient at bedside to discuss possible inpatient rehab placement. Discussed will include 3 hours a day with MD visit each day. Discussed with patient that NG tube needs to be discontinued prior to admission. Patient is interested and will follow for timing of when to open insurance. Will plan to call son to discuss and answer questions.   Darshawn Boateng, PT, GCS 02/24/22,3:37 PM

## 2022-02-24 NOTE — Plan of Care (Signed)
  Problem: Coping: Goal: Ability to adjust to condition or change in health will improve Outcome: Progressing   Problem: Fluid Volume: Goal: Ability to maintain a balanced intake and output will improve Outcome: Progressing   Problem: Health Behavior/Discharge Planning: Goal: Ability to manage health-related needs will improve Outcome: Progressing   

## 2022-02-24 NOTE — Plan of Care (Signed)
  Problem: Skin Integrity: Goal: Risk for impaired skin integrity will decrease Outcome: Progressing   Problem: Clinical Measurements: Goal: Ability to maintain clinical measurements within normal limits will improve Outcome: Progressing   Problem: Education: Goal: Knowledge of General Education information will improve Description: Including pain rating scale, medication(s)/side effects and non-pharmacologic comfort measures Outcome: Progressing

## 2022-02-24 NOTE — Plan of Care (Signed)
Echocardiogram was normal. Cardiology will sign off, thank you for the consult.

## 2022-02-24 NOTE — Progress Notes (Signed)
Occupational Therapy Treatment Patient Details Name: Steven Ward MRN: [id number] DOB: [birth year] Today's Date: 02/24/2022   History of present illness The pt is a 88 yo male presenting 6/7 with weakness, fatigue, poor appetite, and fall while walking to the bathroom on day of admission. Pt found to be in afib with frequent PVCs (new after ablation 20 years ago). CT revealed mid/distal SBO, NG tube placed 6/7. MRI revealed R cerebellar infarct. PMH includes: DM II, HLD, PVD, PD, PAF, CKD III, colon cancer, and BPH.   OT comments  Patient continues to make steady progress towards goals in skilled OT session. Patient's session encompassed co-treat with PT in hopes to progress functional mobility and completion of ADL tasks. Patient minimally flat upon OT and PT entry, unaware of soiled linen underneath him. Patient min A for upper body bathing and dressing, and mod-max A for lower body bathing in standing. Patient with significant lower blood pressure in standing, see levels below, and patient provided education with regard to telling OT and PT he was feeling poorly instead of being stoic. Of note, PT and OT had been asking patient frequently throughout session if patient felt poorly, with patient only endorsing "droopiness" with movement to EOB. OT continuing to recommend AIR placement.   BP supine: 115/57 (73) BP seated EOB: 110/63 (77) BP in standing after 2 minutes: 78/39 (44) BP seated in recliner 72/33 (47) BP with legs elevated seated: 94/55 (64) BP seated after 2 minutes: 105/57 (71) BP seated after 3 minutes: 106/84 (91)   Recommendations for follow up therapy are one component of a multi-disciplinary discharge planning process, led by the attending physician.  Recommendations may be updated based on patient status, additional functional criteria and insurance authorization.    Follow Up Recommendations  Acute inpatient rehab (3hours/day)    Assistance Recommended at Discharge  Frequent or constant Supervision/Assistance  Patient can return home with the following  A lot of help with bathing/dressing/bathroom;Assistance with cooking/housework;Help with stairs or ramp for entrance;Assist for transportation;Direct supervision/assist for financial management;Direct supervision/assist for medications management;A lot of help with walking and/or transfers   Equipment Recommendations  None recommended by OT    Recommendations for Other Services      Precautions / Restrictions Precautions Precautions: Fall Precaution Comments: pt reports 3-4 falls in last 6 months, watch BP Restrictions Weight Bearing Restrictions: No       Mobility Bed Mobility Overal bed mobility: Needs Assistance Bed Mobility: Supine to Sit     Supine to sit: Mod assist     General bed mobility comments: mod A to scoot hips fully to EOB, increased initiation in comparison to eval    Transfers Overall transfer level: Needs assistance Equipment used: Rolling walker (2 wheels) Transfers: Sit to/from Stand Sit to Stand: Min assist, +2 physical assistance, +2 safety/equipment     Step pivot transfers: +2 physical assistance, +2 safety/equipment, Mod assist     General transfer comment: min A of 2 to come into standing, cues for hand placement, mod A of 2 to step pivot to recliner due to low BP numbers     Balance Overall balance assessment: Needs assistance Sitting-balance support: Bilateral upper extremity supported, Feet supported Sitting balance-Leahy Scale: Fair     Standing balance support: Reliant on assistive device for balance, Bilateral upper extremity supported, During functional activity Standing balance-Leahy Scale: Poor Standing balance comment: dependent on BUE support and assist, up to modA due to posterior lean and low BP  ADL either performed or assessed with clinical judgement   ADL Overall ADL's : Needs  assistance/impaired Eating/Feeding: Set up;Bed level   Grooming: Wash/dry hands;Wash/dry face;Set up;Sitting                   Toilet Transfer: +2 for safety/equipment;Minimal assistance;BSC/3in1;Stand-pivot Toilet Transfer Details (indicate cue type and reason): simulated to recliner Toileting- Clothing Manipulation and Hygiene: Maximal assistance;Total assistance Toileting - Clothing Manipulation Details (indicate cue type and reason): total A in standing to complete peri-care after patient found sitting in soiled linen     Functional mobility during ADLs: Minimal assistance;+2 for physical assistance;+2 for safety/equipment;Cueing for safety;Cueing for sequencing General ADL Comments: Patient with significant low BPs in session when attempting to progress functionally with PT    Extremity/Trunk Assessment              Vision       Perception     Praxis      Cognition Arousal/Alertness: Awake/alert Behavior During Therapy: Flat affect Overall Cognitive Status: No family/caregiver present to determine baseline cognitive functioning Area of Impairment: Following commands, Safety/judgement, Awareness, Problem solving                       Following Commands: Follows one step commands inconsistently, Follows one step commands with increased time Safety/Judgement: Decreased awareness of safety, Decreased awareness of deficits Awareness: Intellectual Problem Solving: Slow processing, Decreased initiation, Difficulty sequencing, Requires verbal cues General Comments: patient in wet linen and did not notice, patient also not reporting feeling dizzy resulting in significantly low BP in standing        Exercises      Shoulder Instructions       General Comments      Pertinent Vitals/ Pain       Pain Assessment Pain Assessment: No/denies pain  Home Living                                          Prior Functioning/Environment               Frequency  Min 2X/week        Progress Toward Goals  OT Goals(current goals can now be found in the care plan section)  Progress towards OT goals: Progressing toward goals  Acute Rehab OT Goals OT Goal Formulation: With patient Time For Goal Achievement: 03/09/22 Potential to Achieve Goals: [place] Discharge plan remains appropriate    Co-evaluation                 AM-PAC OT "6 Clicks" Daily Activity     Outcome Measure   Help from another person eating meals?: A Little Help from another person taking care of personal grooming?: A Little Help from another person toileting, which includes using toliet, bedpan, or urinal?: A Lot Help from another person bathing (including washing, rinsing, drying)?: A Lot Help from another person to put on and taking off regular upper body clothing?: A Little Help from another person to put on and taking off regular lower body clothing?: A Lot 6 Click Score: 15    End of Session Equipment Utilized During Treatment: Gait belt;Rolling walker (2 wheels)  OT Visit Diagnosis: Unsteadiness on feet (R26.81);Muscle weakness (generalized) (M62.81);Ataxia, unspecified (R27.0)   Activity Tolerance Treatment limited secondary to medical complications (Comment)   Patient Left in chair;with call  bell/phone within reach;with chair alarm set   Nurse Communication Mobility status        Time: 1422-1450 OT Time Calculation (min): 28 min  Charges: OT General Charges $OT Visit: 1 Visit OT Treatments $Self Care/Home Management : 8-22 mins  Steven Ward, OTR/L Acute Rehabilitation Services [phone]    Ascencion Dike 02/24/2022, 3:45 PM

## 2022-02-24 NOTE — Progress Notes (Signed)
Carotid duplex study completed. Please see CV Proc for preliminary results.  Kohana Amble BS, RVT 02/24/2022 10:09 AM

## 2022-02-25 ENCOUNTER — Inpatient Hospital Stay (HOSPITAL_COMMUNITY): Payer: PPO

## 2022-02-25 DIAGNOSIS — E1122 Type 2 diabetes mellitus with diabetic chronic kidney disease: Secondary | ICD-10-CM | POA: Diagnosis not present

## 2022-02-25 DIAGNOSIS — R531 Weakness: Secondary | ICD-10-CM | POA: Diagnosis not present

## 2022-02-25 DIAGNOSIS — I480 Paroxysmal atrial fibrillation: Secondary | ICD-10-CM | POA: Diagnosis not present

## 2022-02-25 DIAGNOSIS — N179 Acute kidney failure, unspecified: Secondary | ICD-10-CM | POA: Diagnosis not present

## 2022-02-25 LAB — GLUCOSE, CAPILLARY
Glucose-Capillary: 164 mg/dL — ABNORMAL HIGH (ref 70–99)
Glucose-Capillary: 176 mg/dL — ABNORMAL HIGH (ref 70–99)
Glucose-Capillary: 182 mg/dL — ABNORMAL HIGH (ref 70–99)
Glucose-Capillary: 119 mg/dL — ABNORMAL HIGH (ref 70–99)

## 2022-02-25 LAB — BASIC METABOLIC PANEL
Anion gap: 13 (ref 5–15)
BUN: 78 mg/dL — ABNORMAL HIGH (ref 8–23)
CO2: 20 mmol/L — ABNORMAL LOW (ref 22–32)
Calcium: 7.8 mg/dL — ABNORMAL LOW (ref 8.9–10.3)
Chloride: 109 mmol/L (ref 98–111)
Creatinine, Ser: 2.26 mg/dL — ABNORMAL HIGH (ref 0.61–1.24)
GFR, Estimated: 26 mL/min — ABNORMAL LOW (ref >60)
Glucose, Bld: 207 mg/dL — ABNORMAL HIGH (ref 70–99)
Potassium: 2.8 mmol/L — ABNORMAL LOW (ref 3.5–5.1)
Sodium: 142 mmol/L (ref 135–145)

## 2022-02-25 MED ORDER — POTASSIUM CHLORIDE 10 MEQ/100ML IV SOLN
10.0000 meq | INTRAVENOUS | Status: AC
  Administered 2022-02-25 (×3): 10 meq via INTRAVENOUS
  Filled 2022-02-25 (×3): qty 100

## 2022-02-25 MED ORDER — POTASSIUM CHLORIDE CRYS ER 20 MEQ PO TBCR
40.0000 meq | EXTENDED_RELEASE_TABLET | Freq: Once | ORAL | Status: AC
  Administered 2022-02-25: 40 meq via ORAL
  Filled 2022-02-25: qty 2

## 2022-02-25 NOTE — Progress Notes (Signed)
Patient ID: Steven Ward, male   DOB: [birth year], 88 y.o.   MRN: [id number] [facility] Progress Note     Subjective: CC-  No complaints. Denies abdominal pain, bloating, n/v. Passing flatus. He had a tiny smear BM yesterday. Tolerating clear liquids. NG clamped yesterday morning. It was accidentally unclamped early this morning and 400cc drained. Back to clamped now.  Objective: Vital signs in last 24 hours: Temp:  [98 F (36.7 C)-98.5 F (36.9 C)] 98 F (36.7 C) (06/10 0337) Pulse Rate:  [71-80] 80 (06/09 2047) Resp:  [13-16] 16 (06/10 0337) BP: (104-106)/(52-72) 104/72 (06/10 0337) SpO2:  [97 %-99 %] 97 % (06/10 0337) Weight:  [68.9 kg] 68.9 kg (06/10 0337) Last BM Date : 02/24/22  Intake/Output from previous day: 06/09 0701 - 06/10 0700 In: 4205.6 [P.O.:1320; I.V.:2765.6; NG/GT:20; IV Piggyback:100] Out: 950 [Urine:550; Emesis/NG output:400] Intake/Output this shift: No intake/output data recorded.  PE: Gen:  Alert, NAD, pleasant Abd: soft, mild distension, nontender, +BS  Lab Results:  Recent Labs    02/23/22 0305  WBC 4.6  HGB 11.7*  HCT 35.1*  PLT 159   BMET Recent Labs    02/23/22 0305 02/24/22 1044  NA 141 145  K 3.8 3.2*  CL 107 110  CO2 25 20*  GLUCOSE 141* 124*  BUN 93* 102*  CREATININE 2.50* 2.79*  CALCIUM 8.2* 8.1*   PT/INR No results for input(s): "LABPROT", "INR" in the last 72 hours. CMP     Component Value Date/Time   NA 145 02/24/2022 1044   K 3.2 (L) 02/24/2022 1044   CL 110 02/24/2022 1044   CO2 20 (L) 02/24/2022 1044   GLUCOSE 124 (H) 02/24/2022 1044   BUN 102 (H) 02/24/2022 1044   CREATININE 2.79 (H) 02/24/2022 1044   CALCIUM 8.1 (L) 02/24/2022 1044   PROT 6.8 02/22/2022 0847   ALBUMIN 3.4 (L) 02/22/2022 0847   AST 18 02/22/2022 0847   ALT 18 02/22/2022 0847   ALKPHOS 55 02/22/2022 0847   BILITOT 1.2 02/22/2022 0847   GFRNONAA 20 (L) 02/24/2022 1044   GFRAA  04/26/2009 0920    >60        The eGFR has  been calculated using the MDRD equation. This calculation has not been validated in all clinical situations. eGFR's persistently <60 mL/min signify possible Chronic Kidney Disease.   Lipase  No results found for: "LIPASE"     Studies/Results: VAS US CAROTID  Result Date: 02/24/2022 Carotid Arterial Duplex Study Patient Name:  Steven Ward  Date of Exam:   02/24/2022 Medical Rec #: [id number]         Accession #:    [id number] Date of Birth: 1924/10/21         Patient Gender: M Patient Age:   61 years Exam Location:  [facility] Procedure:      VAS US CAROTID Referring Phys: Beulah Gandy --------------------------------------------------------------------------------  Indications:       CVA. Risk Factors:      Hypertension, Diabetes, past history of smoking, prior CVA. Comparison Study:  No previous study to compare. Performing Technologist: Bobetta Lime  Examination Guidelines: A complete evaluation includes B-mode imaging, spectral Doppler, color Doppler, and power Doppler as needed of all accessible portions of each vessel. Bilateral testing is considered an integral part of a complete examination. Limited examinations for reoccurring indications may be performed as noted.  Right Carotid Findings: +----------+--------+--------+--------+------------------+--------+           PSV cm/sEDV cm/sStenosisPlaque  DescriptionComments +----------+--------+--------+--------+------------------+--------+ CCA Prox  81      9                                          +----------+--------+--------+--------+------------------+--------+ CCA Distal36      12                                         +----------+--------+--------+--------+------------------+--------+ ICA Prox  40      10              heterogenous               +----------+--------+--------+--------+------------------+--------+ ICA Distal55      17                                          +----------+--------+--------+--------+------------------+--------+ ECA       83      0                                          +----------+--------+--------+--------+------------------+--------+ +----------+--------+-------+----------------+-------------------+           PSV cm/sEDV cmsDescribe        Arm Pressure (mmHG) +----------+--------+-------+----------------+-------------------+ Subclavian109            Multiphasic, WNL                    +----------+--------+-------+----------------+-------------------+ +---------+--------+--+--------+-+---------+ VertebralPSV cm/s32EDV cm/s6Antegrade +---------+--------+--+--------+-+---------+  Left Carotid Findings: +----------+--------+--------+--------+------------------+--------+           PSV cm/sEDV cm/sStenosisPlaque DescriptionComments +----------+--------+--------+--------+------------------+--------+ CCA Prox  68      10                                         +----------+--------+--------+--------+------------------+--------+ CCA Distal64      11                                         +----------+--------+--------+--------+------------------+--------+ ICA Prox  63      12              heterogenous               +----------+--------+--------+--------+------------------+--------+ ICA Distal101     21                                         +----------+--------+--------+--------+------------------+--------+ ECA       78      0               heterogenous               +----------+--------+--------+--------+------------------+--------+ +----------+--------+--------+----------------+-------------------+           PSV cm/sEDV cm/sDescribe        Arm Pressure (mmHG) +----------+--------+--------+----------------+-------------------+ Subclavian  Multiphasic, WNL                    +----------+--------+--------+----------------+-------------------+  +---------+--------+--+--------+--+---------+ VertebralPSV cm/s46EDV cm/s11Antegrade +---------+--------+--+--------+--+---------+   Summary: Right Carotid: Velocities in the right ICA are consistent with a 1-39% stenosis. Left Carotid: Velocities in the left ICA are consistent with a 1-39% stenosis. Vertebrals:  Bilateral vertebral arteries demonstrate antegrade flow. Subclavians: Normal flow hemodynamics were seen in bilateral subclavian              arteries. *See table(s) above for measurements and observations.  Electronically signed by Antony Contras MD on 02/24/2022 at 12:18:10 PM.    Final    DG Abd 1 View  Result Date: 02/24/2022 CLINICAL DATA:  Small bowel delay EXAM: ABDOMEN - 1 VIEW COMPARISON:  02/23/2022 abdominal radiograph FINDINGS: Enteric tube terminates in the gastric fundus. Persistent moderate diffuse small bowel dilatation, similar to slightly worsened. Oral contrast has progressed to the right and left colon. No evidence of pneumatosis or pneumoperitoneum. Vertical surgical clips again noted to the right of the lower spine. Marked lumbar spondylosis. Cholecystectomy clips are seen in the right upper quadrant of the abdomen. IMPRESSION: 1. Enteric tube terminates in the gastric fundus. 2. Progression of oral contrast to the colon suggests improving distal small bowel obstruction, although the degree of diffuse small bowel dilatation appears slightly worsened. Electronically Signed   By: Ilona Sorrel M.D.   On: 02/24/2022 08:40   DG Abd Portable 1V-Small Bowel Obstruction Protocol-initial, 8 hr delay  Result Date: 02/23/2022 CLINICAL DATA:  8 hour small-bowel follow-up film EXAM: PORTABLE ABDOMEN - 1 VIEW COMPARISON:  Film from earlier in the same day. FINDINGS: Gastric catheter is again seen in the stomach. Changes of small-bowel obstruction are again noted. Previously administered contrast again lies within the small bowel without significant colonic contrast. Continued follow-up is  recommended. IMPRESSION: Persistent small bowel obstruction with contrast in the small bowel. Continued follow-up is recommended. Electronically Signed   By: Inez Catalina M.D.   On: 02/23/2022 19:58   DG Abd 1 View  Result Date: 02/23/2022 CLINICAL DATA:  Check gastric catheter placement EXAM: ABDOMEN - 1 VIEW COMPARISON:  Film from earlier in the same day. FINDINGS: Small bowel dilatation is noted which is slightly greater than that seen on the prior exam consistent with small bowel obstruction. Gastric catheter remains in satisfactory position. Contrast material was administered and lies within the stomach as well as the small bowel. No definitive colonic contrast is noted. Continued follow-up is recommended. IMPRESSION: Changes consistent with worsening small bowel obstruction. Gastric catheter is noted in place. Administered contrast shows within the small bowel. No colonic contrast is seen at this time. Continued follow-up is recommended. Electronically Signed   By: Inez Catalina M.D.   On: 02/23/2022 19:28   ECHOCARDIOGRAM COMPLETE  Result Date: 02/23/2022    ECHOCARDIOGRAM REPORT   Patient Name:   Steven Ward Date of Exam: 02/23/2022 Medical Rec #:  [id number]        Height:       68.0 in Accession #:    [id number]       Weight:       144.0 lb Date of Birth:  07/30/1925        BSA:          1.777 m Patient Age:    89 years         BP:           110/68 mmHg  Patient Gender: M                HR:           88 bpm. Exam Location:  Inpatient Procedure: 2D Echo, Cardiac Doppler and Color Doppler Indications:    Stroke I63.9  History:        Patient has prior history of Echocardiogram examinations, most                 recent 07/19/2011. Risk Factors:GERD, Hypertension and Diabetes.  Sonographer:    Bernadene Person RDCS Referring Phys: [id number] [place]  1. Left ventricular ejection fraction, by estimation, is 55 to 60%. The left ventricle has normal function. The left ventricle has no  regional wall motion abnormalities. There is mild concentric left ventricular hypertrophy. Left ventricular diastolic function could not be evaluated.  2. Right ventricular systolic function is normal. The right ventricular size is normal. There is normal pulmonary artery systolic pressure.  3. Left atrial size was severely dilated.  4. The mitral valve is normal in structure. Trivial mitral valve regurgitation. No evidence of mitral stenosis.  5. The aortic valve is calcified. There is mild calcification of the aortic valve. There is mild thickening of the aortic valve. Aortic valve regurgitation is not visualized. No aortic stenosis is present.  6. The inferior vena cava is normal in size with greater than 50% respiratory variability, suggesting right atrial pressure of 3 mmHg. FINDINGS  Left Ventricle: Left ventricular ejection fraction, by estimation, is 55 to 60%. The left ventricle has normal function. The left ventricle has no regional wall motion abnormalities. The left ventricular internal cavity size was normal in size. There is  mild concentric left ventricular hypertrophy. Left ventricular diastolic function could not be evaluated due to atrial fibrillation. Left ventricular diastolic function could not be evaluated. Right Ventricle: The right ventricular size is normal. No increase in right ventricular wall thickness. Right ventricular systolic function is normal. There is normal pulmonary artery systolic pressure. The tricuspid regurgitant velocity is 2.08 m/s, and  with an assumed right atrial pressure of 3 mmHg, the estimated right ventricular systolic pressure is 67.3 mmHg. Left Atrium: Left atrial size was severely dilated. Right Atrium: Right atrial size was normal in size. Pericardium: There is no evidence of pericardial effusion. Mitral Valve: The mitral valve is normal in structure. Mild mitral annular calcification. Trivial mitral valve regurgitation. No evidence of mitral valve stenosis. MV  peak gradient, 5.2 mmHg. The mean mitral valve gradient is 2.0 mmHg. Tricuspid Valve: The tricuspid valve is normal in structure. Tricuspid valve regurgitation is trivial. No evidence of tricuspid stenosis. Aortic Valve: The aortic valve is calcified. There is mild calcification of the aortic valve. There is mild thickening of the aortic valve. Aortic valve regurgitation is not visualized. No aortic stenosis is present. Pulmonic Valve: The pulmonic valve was normal in structure. Pulmonic valve regurgitation is not visualized. No evidence of pulmonic stenosis. Aorta: The aortic root is normal in size and structure. Venous: The inferior vena cava is normal in size with greater than 50% respiratory variability, suggesting right atrial pressure of 3 mmHg. IAS/Shunts: No atrial level shunt detected by color flow Doppler.  LEFT VENTRICLE PLAX 2D LVIDd:         4.30 cm LVIDs:         3.00 cm LV PW:         1.30 cm LV IVS:        1.20 cm LVOT  diam:     2.00 cm LV SV:         72 LV SV Index:   40 LVOT Area:     3.14 cm  LV Volumes (MOD) LV vol d, MOD A2C: 74.3 ml LV vol d, MOD A4C: 60.6 ml LV vol s, MOD A2C: 27.9 ml LV vol s, MOD A4C: 22.0 ml LV SV MOD A2C:     46.4 ml LV SV MOD A4C:     60.6 ml LV SV MOD BP:      44.7 ml RIGHT VENTRICLE RV S prime:     9.98 cm/s TAPSE (M-mode): 1.8 cm LEFT ATRIUM             Index        RIGHT ATRIUM           Index LA diam:        3.90 cm 2.19 cm/m   RA Area:     17.00 cm LA Vol (A2C):   78.9 ml 44.40 ml/m  RA Volume:   42.40 ml  23.86 ml/m LA Vol (A4C):   84.7 ml 47.66 ml/m LA Biplane Vol: 90.5 ml 50.92 ml/m  AORTIC VALVE LVOT Vmax:   121.00 cm/s LVOT Vmean:  84.733 cm/s LVOT VTI:    0.228 m  AORTA Ao Root diam: 3.40 cm Ao Asc diam:  3.10 cm MITRAL VALVE              TRICUSPID VALVE MV Area (PHT): 2.47 cm   TR Peak grad:   17.3 mmHg MV Area VTI:   2.41 cm   TR Vmax:        208.00 cm/s MV Peak grad:  5.2 mmHg MV Mean grad:  2.0 mmHg   SHUNTS MV Vmax:       1.14 m/s   Systemic VTI:   0.23 m MV Vmean:      53.5 cm/s  Systemic Diam: 2.00 cm Skeet Latch MD Electronically signed by Skeet Latch MD Signature Date/Time: 02/23/2022/4:19:50 PM    Final     Anti-infectives: Anti-infectives (From admission, onward)    Start     Dose/Rate Route Frequency Ordered Stop   02/23/22 0000  cefTRIAXone (ROCEPHIN) 2 g in sodium chloride 0.9 % 100 mL IVPB        2 g 200 mL/hr over 30 Minutes Intravenous Daily at 10 pm 02/22/22 2346     02/23/22 0000  metroNIDAZOLE (FLAGYL) IVPB 500 mg  Status:  Discontinued        500 mg 100 mL/hr over 60 Minutes Intravenous 2 times daily 02/22/22 2346 02/23/22 1802        Assessment/Plan SBO -protocol showed contrast in the colon despite small bowel still being dilated.   -Patient was tolerating NG clamping trial and clear liquids. When NG was hooked back to LIWS 400cc drained -Keep clamped today and continue clear liquids. Repeat abdominal film this morning. Mobilize.    FEN - CLD/NGT clamped/IVFs per TRH VTE - ok for chemical prophylaxis from our standpoint ID - none   CVA DM HTN Pancreatic insufficiency A fib Parkinson's disease Hypothyroidism BPH Pulmonary fibrosis/RLL mass   I reviewed hospitalist notes, last 24 h vitals and pain scores, last 48 h intake and output, last 24 h labs and trends, and last 24 h imaging results.     LOS: 2 days    [facility] 02/25/2022, 8:55 AM Please see Amion for pager number during day  hours 7:00am-4:30pm

## 2022-02-25 NOTE — Progress Notes (Signed)
PROGRESS NOTE    Steven Ward  XVQ:[id number] DOB: 01/16/1925 DOA: 02/22/2022 PCP: Steven Post, MD   Brief Narrative: Steven Ward is a 88 y.o. male with medical history significant of paroxysmal A-fib not on anticoagulation, hypertension, hypothyroidism, Parkinson's disease, lumbar spondylosis, type 2 diabetes, hyperlipidemia, PVD, CKD stage IIIb, remote history of colon cancer, BPH, pancreatic insufficiency, gallstones, GERD. Patient presented secondary to generalized weakness and found to have evidence of atrial fibrillation, SBO and acute infarct. General surgery consulted; NPO with NG tube placed. Cardiology consulted and patient's rate managed with beta blocker. Neurology consulted for stroke.   Assessment and Plan:  Paroxysmal atrial fibrillation Rate controlled. Cardiology consulted. Transthoracic Echocardiogram ordered and significant for severely dilated left atrium with normal LVEF. Anticoagulation deferred secondary to acute infarct, per cardiology. Started on metoprolol PO -Cardiology recommendations: Metoprolol -Start Eliquis once okay per general surgery  Small bowel obstruction Noted on CT abdomen/pelvis on 6/7. General surgery consulted. NG tube placed and intermittent suction applied. NPO. -General surgery recommendations: SBO protocol  AKI on CKD stage IIIb Baseline creatinine of about 1.7-2. Creatinine of 2.47 on admission with peak of 2.5. Started on IV fluids -Continue IV fluids  Acute CVA Acute punctate infarct noted on MRI located within the right cerebellar hemisphere in addition to possible subacute infarct of right frontal lobe. LDL of 34. Hemoglobin A1C of 7.7%. Transthoracic Echocardiogram with normal LVEF of 55-60% with no evidence of atrial level shunt. -Neurology recommendations: Aspirin 81 mg, Eliquis. Outpatient follow-up. Signed off. -PT/OT recommending CIR -SLP pending  Generalized weakness PT recommending CIR. Consult  placed.  Primary hypertension Patient is on hydrochlorothiazide and Lasix as an outpatient. Started on metoprolol inpatient. -Continue metoprolol  Right upper lobe opacities Concerning for possible infection. Asymptomatic at this time. Started on Ceftriaxone and Flagyl for CAP/aspiration pneumonia -Continue Ceftriaxone  Diabetes mellitus, type 2 Hemoglobin A1C of 7.7%. Patient on glipizide.  Parkinson disease -Continue Sinemet CR  Hypothyroidism -Continue Synthroid  BPH -Continue tamsulosin  Chronic pancreatic insufficiency -Continue Creon  T5 and T12 vertebral compression fracture Incidental finding. Chronic.   Aortic atherosclerosis Noted on CT imaging.  Bilateral pleural plaques Pulmonary fibrosis Per CT read, consistent with asbestos related pleural disease. Follow-up with pulmonology outpatient.  3.5 cm RLL mass Newly noted. Rounded atelectasis vs pulmonary neoplasm. Recommendation for follow-up in 3 months  Pressure injury Right perineum, POA. -Wound care per nursing    DVT prophylaxis: SCDs Code Status:   Code Status: DNR Family Communication: None at bedside Disposition Plan: Discharge possibly to CIR pending management of SBO   Consultants:  General surgery Cardiology Neurology  Procedures:  None  Antimicrobials: Ceftriaxone Flagyl    Subjective: No concerns this morning. Passing some gas. Having BM smears but no true bowel movement.  Objective: BP 104/72 (BP Location: Left Arm)   Pulse 80   Temp 98 F (36.7 C) (Oral)   Resp 16   Ht $R'5\' 8"'WA$  (1.727 m)   Wt 68.9 kg   SpO2 97%   BMI 23.10 kg/m   Examination:  General exam: Appears calm and comfortable Respiratory system: Clear to auscultation. Respiratory effort normal. Cardiovascular system: S1 & S2 heard, RRR. No murmurs, rubs, gallops or clicks. Gastrointestinal system: Abdomen is distended, soft and non-tender. Normal bowel sounds heard. Central nervous system:  Alert. Musculoskeletal: No edema. No calf tenderness Skin: No cyanosis. No rashes    Data Reviewed: I have personally reviewed following labs and imaging studies  CBC Lab Results  Component Value Date   WBC 4.6 02/23/2022   RBC 3.61 (L) 02/23/2022   HGB 11.7 (L) 02/23/2022   HCT 35.1 (L) 02/23/2022   MCV 97.2 02/23/2022   MCH 32.4 02/23/2022   PLT 159 02/23/2022   MCHC 33.3 02/23/2022   RDW 14.2 02/23/2022   LYMPHSABS 0.8 02/22/2022   MONOABS 0.7 02/22/2022   EOSABS 0.0 02/22/2022   BASOSABS 0.0 70/78/6754     Last metabolic panel Lab Results  Component Value Date   NA 145 02/24/2022   K 3.2 (L) 02/24/2022   CL 110 02/24/2022   CO2 20 (L) 02/24/2022   BUN 102 (H) 02/24/2022   CREATININE 2.79 (H) 02/24/2022   GLUCOSE 124 (H) 02/24/2022   GFRNONAA 20 (L) 02/24/2022   GFRAA  04/26/2009    >60        The eGFR has been calculated using the MDRD equation. This calculation has not been validated in all clinical situations. eGFR's persistently <60 mL/min signify possible Chronic Kidney Disease.   CALCIUM 8.1 (L) 02/24/2022   PROT 6.8 02/22/2022   ALBUMIN 3.4 (L) 02/22/2022   BILITOT 1.2 02/22/2022   ALKPHOS 55 02/22/2022   AST 18 02/22/2022   ALT 18 02/22/2022   ANIONGAP 15 02/24/2022    GFR: Estimated Creatinine Clearance: 15.3 mL/min (A) (by C-G formula based on SCr of 2.79 mg/dL (H)).  No results found for this or any previous visit (from the past 240 hour(s)).    Radiology Studies: DG Abd Portable 1V  Result Date: 02/25/2022 CLINICAL DATA:  Abdominal distention EXAM: PORTABLE ABDOMEN - 1 VIEW COMPARISON:  Previous studies including the examination of 02/24/2022 FINDINGS: There is abnormal dilation of small-bowel loops measuring up to 6.1 cm in maximum diameter. Tip NG tube is seen in the stomach. Colon is not distended. Overall, no significant interval changes are noted. Surgical clips are seen in the right upper quadrant. IMPRESSION: Findings suggest  partial small bowel obstruction with no significant interval change. Electronically Signed   By: Elmer Picker M.D.   On: 02/25/2022 11:08   VAS US CAROTID  Result Date: 02/24/2022 Carotid Arterial Duplex Study Patient Name:  Steven Ward  Date of Exam:   02/24/2022 Medical Rec #: [id number]         Accession #:    [id number] Date of Birth: [birth year]         Patient Gender: M Patient Age:   69 years Exam Location:  [facility] Procedure:      VAS US CAROTID Referring Phys: Beulah Gandy --------------------------------------------------------------------------------  Indications:       CVA. Risk Factors:      Hypertension, Diabetes, past history of smoking, prior CVA. Comparison Study:  No previous study to compare. Performing Technologist: Bobetta Lime  Examination Guidelines: A complete evaluation includes B-mode imaging, spectral Doppler, color Doppler, and power Doppler as needed of all accessible portions of each vessel. Bilateral testing is considered an integral part of a complete examination. Limited examinations for reoccurring indications may be performed as noted.  Right Carotid Findings: +----------+--------+--------+--------+------------------+--------+           PSV cm/sEDV cm/sStenosisPlaque DescriptionComments +----------+--------+--------+--------+------------------+--------+ CCA Prox  81      9                                          +----------+--------+--------+--------+------------------+--------+ CCA Distal36  12                                         +----------+--------+--------+--------+------------------+--------+ ICA Prox  40      10              heterogenous               +----------+--------+--------+--------+------------------+--------+ ICA Distal55      17                                         +----------+--------+--------+--------+------------------+--------+ ECA       83      0                                           +----------+--------+--------+--------+------------------+--------+ +----------+--------+-------+----------------+-------------------+           PSV cm/sEDV cmsDescribe        Arm Pressure (mmHG) +----------+--------+-------+----------------+-------------------+ Subclavian109            Multiphasic, WNL                    +----------+--------+-------+----------------+-------------------+ +---------+--------+--+--------+-+---------+ VertebralPSV cm/s32EDV cm/s6Antegrade +---------+--------+--+--------+-+---------+  Left Carotid Findings: +----------+--------+--------+--------+------------------+--------+           PSV cm/sEDV cm/sStenosisPlaque DescriptionComments +----------+--------+--------+--------+------------------+--------+ CCA Prox  68      10                                         +----------+--------+--------+--------+------------------+--------+ CCA Distal64      11                                         +----------+--------+--------+--------+------------------+--------+ ICA Prox  63      12              heterogenous               +----------+--------+--------+--------+------------------+--------+ ICA Distal101     21                                         +----------+--------+--------+--------+------------------+--------+ ECA       78      0               heterogenous               +----------+--------+--------+--------+------------------+--------+ +----------+--------+--------+----------------+-------------------+           PSV cm/sEDV cm/sDescribe        Arm Pressure (mmHG) +----------+--------+--------+----------------+-------------------+ Subclavian                Multiphasic, WNL                    +----------+--------+--------+----------------+-------------------+ +---------+--------+--+--------+--+---------+ VertebralPSV cm/s46EDV cm/s11Antegrade +---------+--------+--+--------+--+---------+   Summary: Right  Carotid: Velocities in the right ICA are consistent with a 1-39% stenosis. Left Carotid: Velocities in the left ICA are consistent with a 1-39% stenosis.  Vertebrals:  Bilateral vertebral arteries demonstrate antegrade flow. Subclavians: Normal flow hemodynamics were seen in bilateral subclavian              arteries. *See table(s) above for measurements and observations.  Electronically signed by Antony Contras MD on 02/24/2022 at 12:18:10 PM.    Final    DG Abd 1 View  Result Date: 02/24/2022 CLINICAL DATA:  Small bowel delay EXAM: ABDOMEN - 1 VIEW COMPARISON:  02/23/2022 abdominal radiograph FINDINGS: Enteric tube terminates in the gastric fundus. Persistent moderate diffuse small bowel dilatation, similar to slightly worsened. Oral contrast has progressed to the right and left colon. No evidence of pneumatosis or pneumoperitoneum. Vertical surgical clips again noted to the right of the lower spine. Marked lumbar spondylosis. Cholecystectomy clips are seen in the right upper quadrant of the abdomen. IMPRESSION: 1. Enteric tube terminates in the gastric fundus. 2. Progression of oral contrast to the colon suggests improving distal small bowel obstruction, although the degree of diffuse small bowel dilatation appears slightly worsened. Electronically Signed   By: Ilona Sorrel M.D.   On: 02/24/2022 08:40   DG Abd Portable 1V-Small Bowel Obstruction Protocol-initial, 8 hr delay  Result Date: 02/23/2022 CLINICAL DATA:  8 hour small-bowel follow-up film EXAM: PORTABLE ABDOMEN - 1 VIEW COMPARISON:  Film from earlier in the same day. FINDINGS: Gastric catheter is again seen in the stomach. Changes of small-bowel obstruction are again noted. Previously administered contrast again lies within the small bowel without significant colonic contrast. Continued follow-up is recommended. IMPRESSION: Persistent small bowel obstruction with contrast in the small bowel. Continued follow-up is recommended. Electronically Signed    By: Inez Catalina M.D.   On: 02/23/2022 19:58   DG Abd 1 View  Result Date: 02/23/2022 CLINICAL DATA:  Check gastric catheter placement EXAM: ABDOMEN - 1 VIEW COMPARISON:  Film from earlier in the same day. FINDINGS: Small bowel dilatation is noted which is slightly greater than that seen on the prior exam consistent with small bowel obstruction. Gastric catheter remains in satisfactory position. Contrast material was administered and lies within the stomach as well as the small bowel. No definitive colonic contrast is noted. Continued follow-up is recommended. IMPRESSION: Changes consistent with worsening small bowel obstruction. Gastric catheter is noted in place. Administered contrast shows within the small bowel. No colonic contrast is seen at this time. Continued follow-up is recommended. Electronically Signed   By: Inez Catalina M.D.   On: 02/23/2022 19:28      LOS: 2 days    Cordelia Poche, MD [facility] 02/25/2022, 4:37 PM   If 7PM-7AM, please contact night-coverage [email or web address]

## 2022-02-26 ENCOUNTER — Inpatient Hospital Stay (HOSPITAL_COMMUNITY): Payer: PPO

## 2022-02-26 DIAGNOSIS — R531 Weakness: Secondary | ICD-10-CM | POA: Diagnosis not present

## 2022-02-26 DIAGNOSIS — E1122 Type 2 diabetes mellitus with diabetic chronic kidney disease: Secondary | ICD-10-CM | POA: Diagnosis not present

## 2022-02-26 DIAGNOSIS — I480 Paroxysmal atrial fibrillation: Secondary | ICD-10-CM | POA: Diagnosis not present

## 2022-02-26 DIAGNOSIS — N179 Acute kidney failure, unspecified: Secondary | ICD-10-CM | POA: Diagnosis not present

## 2022-02-26 DIAGNOSIS — L8990 Pressure ulcer of unspecified site, unspecified stage: Secondary | ICD-10-CM

## 2022-02-26 LAB — GLUCOSE, CAPILLARY
Glucose-Capillary: 165 mg/dL — ABNORMAL HIGH (ref 70–99)
Glucose-Capillary: 115 mg/dL — ABNORMAL HIGH (ref 70–99)
Glucose-Capillary: 112 mg/dL — ABNORMAL HIGH (ref 70–99)
Glucose-Capillary: 135 mg/dL — ABNORMAL HIGH (ref 70–99)

## 2022-02-26 LAB — POTASSIUM: Potassium: 4.2 mmol/L (ref 3.5–5.1)

## 2022-02-26 LAB — MAGNESIUM: Magnesium: 2 mg/dL (ref 1.7–2.4)

## 2022-02-26 MED ORDER — SODIUM CHLORIDE 0.45 % IV SOLN: INTRAVENOUS | Status: AC

## 2022-02-26 MED ORDER — METOCLOPRAMIDE HCL 5 MG/ML IJ SOLN
5.0000 mg | Freq: Three times a day (TID) | INTRAMUSCULAR | Status: AC
  Administered 2022-02-26 – 2022-03-01 (×8): 5 mg via INTRAVENOUS
  Filled 2022-02-26 (×9): qty 2

## 2022-02-26 NOTE — Progress Notes (Signed)
Patient ID: Steven Ward, male   DOB: 1924-09-25, 88 y.o.   MRN: [id number] [facility] Surgery Progress Note     Subjective: CC-  Feeling more bloated and nauseated, no emesis. Continues to pass some flatus and had 1 good BM yesterday.  Objective: Vital signs in last 24 hours: Temp:  [98 F (36.7 C)] 98 F (36.7 C) (06/11 0448) Pulse Rate:  [75-81] 75 (06/11 0448) Resp:  [16] 16 (06/11 0448) BP: (94-119)/(56-68) 119/68 (06/11 0448) SpO2:  [98 %-100 %] 100 % (06/11 0448) Weight:  [75.4 kg] 75.4 kg (06/11 0448) Last BM Date : 02/24/22  Intake/Output from previous day: 06/10 0701 - 06/11 0700 In: 3088.6 [P.O.:118; I.V.:2570.6; IV Piggyback:400] Out: 1100 [Urine:1100] Intake/Output this shift: No intake/output data recorded.  PE: Gen:  Alert, NAD, pleasant Abd: soft, moderate distension, nontender, few BS heard  Lab Results:  No results for input(s): "WBC", "HGB", "HCT", "PLT" in the last 72 hours. BMET Recent Labs    02/24/22 1044 02/25/22 1648  NA 145 142  K 3.2* 2.8*  CL 110 109  CO2 20* 20*  GLUCOSE 124* 207*  BUN 102* 78*  CREATININE 2.79* 2.26*  CALCIUM 8.1* 7.8*   PT/INR No results for input(s): "LABPROT", "INR" in the last 72 hours. CMP     Component Value Date/Time   NA 142 02/25/2022 1648   K 2.8 (L) 02/25/2022 1648   CL 109 02/25/2022 1648   CO2 20 (L) 02/25/2022 1648   GLUCOSE 207 (H) 02/25/2022 1648   BUN 78 (H) 02/25/2022 1648   CREATININE 2.26 (H) 02/25/2022 1648   CALCIUM 7.8 (L) 02/25/2022 1648   PROT 6.8 02/22/2022 0847   ALBUMIN 3.4 (L) 02/22/2022 0847   AST 18 02/22/2022 0847   ALT 18 02/22/2022 0847   ALKPHOS 55 02/22/2022 0847   BILITOT 1.2 02/22/2022 0847   GFRNONAA 26 (L) 02/25/2022 1648   GFRAA  04/26/2009 0920    >60        The eGFR has been calculated using the MDRD equation. This calculation has not been validated in all clinical situations. eGFR's persistently <60 mL/min signify possible Chronic Kidney  Disease.   Lipase  No results found for: "LIPASE"     Studies/Results: DG Abd Portable 1V  Result Date: 02/25/2022 CLINICAL DATA:  Abdominal distention EXAM: PORTABLE ABDOMEN - 1 VIEW COMPARISON:  Previous studies including the examination of 02/24/2022 FINDINGS: There is abnormal dilation of small-bowel loops measuring up to 6.1 cm in maximum diameter. Tip NG tube is seen in the stomach. Colon is not distended. Overall, no significant interval changes are noted. Surgical clips are seen in the right upper quadrant. IMPRESSION: Findings suggest partial small bowel obstruction with no significant interval change. Electronically Signed   By: Elmer Picker M.D.   On: 02/25/2022 11:08   VAS US CAROTID  Result Date: 02/24/2022 Carotid Arterial Duplex Study Patient Name:  Steven Ward  Date of Exam:   02/24/2022 Medical Rec #: [id number]         Accession #:    [id number] Date of Birth: Nov 21, 1924         Patient Gender: M Patient Age:   57 years Exam Location:  [facility] Procedure:      VAS US CAROTID Referring Phys: Beulah Gandy --------------------------------------------------------------------------------  Indications:       CVA. Risk Factors:      Hypertension, Diabetes, past history of smoking, prior CVA. Comparison Study:  No previous study to  compare. Performing Technologist: Bobetta Lime  Examination Guidelines: A complete evaluation includes B-mode imaging, spectral Doppler, color Doppler, and power Doppler as needed of all accessible portions of each vessel. Bilateral testing is considered an integral part of a complete examination. Limited examinations for reoccurring indications may be performed as noted.  Right Carotid Findings: +----------+--------+--------+--------+------------------+--------+           PSV cm/sEDV cm/sStenosisPlaque DescriptionComments +----------+--------+--------+--------+------------------+--------+ CCA Prox  81      9                                           +----------+--------+--------+--------+------------------+--------+ CCA Distal36      12                                         +----------+--------+--------+--------+------------------+--------+ ICA Prox  40      10              heterogenous               +----------+--------+--------+--------+------------------+--------+ ICA Distal55      17                                         +----------+--------+--------+--------+------------------+--------+ ECA       83      0                                          +----------+--------+--------+--------+------------------+--------+ +----------+--------+-------+----------------+-------------------+           PSV cm/sEDV cmsDescribe        Arm Pressure (mmHG) +----------+--------+-------+----------------+-------------------+ [id number]            Multiphasic, WNL                    +----------+--------+-------+----------------+-------------------+ +---------+--------+--+--------+-+---------+ VertebralPSV cm/s32EDV cm/s6Antegrade +---------+--------+--+--------+-+---------+  Left Carotid Findings: +----------+--------+--------+--------+------------------+--------+           PSV cm/sEDV cm/sStenosisPlaque DescriptionComments +----------+--------+--------+--------+------------------+--------+ CCA Prox  68      10                                         +----------+--------+--------+--------+------------------+--------+ CCA Distal64      11                                         +----------+--------+--------+--------+------------------+--------+ ICA Prox  63      12              heterogenous               +----------+--------+--------+--------+------------------+--------+ ICA Distal101     21                                         +----------+--------+--------+--------+------------------+--------+ ECA       78  0               heterogenous                +----------+--------+--------+--------+------------------+--------+ +----------+--------+--------+----------------+-------------------+           PSV cm/sEDV cm/sDescribe        Arm Pressure (mmHG) +----------+--------+--------+----------------+-------------------+ Subclavian                Multiphasic, WNL                    +----------+--------+--------+----------------+-------------------+ +---------+--------+--+--------+--+---------+ VertebralPSV cm/s46EDV cm/s11Antegrade +---------+--------+--+--------+--+---------+   Summary: Right Carotid: Velocities in the right ICA are consistent with a 1-39% stenosis. Left Carotid: Velocities in the left ICA are consistent with a 1-39% stenosis. Vertebrals:  Bilateral vertebral arteries demonstrate antegrade flow. Subclavians: Normal flow hemodynamics were seen in bilateral subclavian              arteries. *See table(s) above for measurements and observations.  Electronically signed by Antony Contras MD on 02/24/2022 at 12:18:10 PM.    Final     Anti-infectives: Anti-infectives (From admission, onward)    Start     Dose/Rate Route Frequency Ordered Stop   02/23/22 0000  cefTRIAXone (ROCEPHIN) 2 g in sodium chloride 0.9 % 100 mL IVPB        2 g 200 mL/hr over 30 Minutes Intravenous Daily at 10 pm 02/22/22 2346     02/23/22 0000  metroNIDAZOLE (FLAGYL) IVPB 500 mg  Status:  Discontinued        500 mg 100 mL/hr over 60 Minutes Intravenous 2 times daily 02/22/22 2346 02/23/22 1802        Assessment/Plan Partial SBO -protocol showed contrast in the colon despite small bowel still being significantly dilated -Having bowel function but is also more distended and nauseated today. Return NG to LIWS. Repeat xray in the AM. Patient is unsure if he would want to undergo surgery if this does not resolve, he states he will think about it.   FEN - NG/NPO, IVF VTE - hold eliquis, ok for heparin gtt or chemical prophylaxis from our standpoint ID -  rocephin 6/8>>   CVA DM HTN Pancreatic insufficiency A fib on eliquis  Parkinson's disease Hypothyroidism BPH Pulmonary fibrosis/RLL mass   I reviewed hospitalist notes, last 24 h vitals and pain scores, last 48 h intake and output, last 24 h labs and trends, and last 24 h imaging results.     LOS: 3 days    [facility] 02/26/2022, 8:50 AM Please see Amion for pager number during day hours 7:00am-4:30pm

## 2022-02-26 NOTE — Progress Notes (Addendum)
PROGRESS NOTE    Steven Ward  UKG:[id number] DOB: 1924/10/22 DOA: 02/22/2022 PCP: [name], MD   Brief Narrative: Steven Ward is a 88 y.o. male with medical history significant of paroxysmal A-fib not on anticoagulation, hypertension, hypothyroidism, Parkinson's disease, lumbar spondylosis, type 2 diabetes, hyperlipidemia, PVD, CKD stage IIIb, remote history of colon cancer, BPH, pancreatic insufficiency, gallstones, GERD. Patient presented secondary to generalized weakness and found to have evidence of atrial fibrillation, SBO and acute infarct. General surgery consulted; NPO with NG tube placed. Cardiology consulted and patient's rate managed with beta blocker. Neurology consulted for stroke.   Assessment and Plan:  Paroxysmal atrial fibrillation Rate controlled. Cardiology consulted. Transthoracic Echocardiogram ordered and significant for severely dilated left atrium with normal LVEF. Anticoagulation deferred secondary to acute infarct, per cardiology. Started on metoprolol PO -Cardiology recommendations: Metoprolol -Start Eliquis once okay per general surgery  Small bowel obstruction Noted on CT abdomen/pelvis on 6/7. General surgery consulted. NG tube placed and intermittent suction applied. NPO. -General surgery recommendations: SBO protocol, NG tube unclamped and suction reapplied  Hypokalemia Unsure of etiology. Supplementation given overnight -Recheck potassium this afternoon. -Check magnesium  AKI on CKD stage IIIb Baseline creatinine of about 1.7-2. Creatinine of 2.47 on admission with peak of 2.5. Started on IV fluids -Continue IV fluids; switch to 1/2 NS -BMP in AM  Acute CVA Acute punctate infarct noted on MRI located within the right cerebellar hemisphere in addition to possible subacute infarct of right frontal lobe. LDL of 34. Hemoglobin A1C of 7.7%. Transthoracic Echocardiogram with normal LVEF of 55-60% with no evidence of atrial level  shunt. -Neurology recommendations: Aspirin 81 mg, Eliquis. Outpatient follow-up. Signed off. -PT/OT recommending CIR -SLP pending (complicated by SBO)  Generalized weakness PT recommending CIR. Consult placed.  Primary hypertension Patient is on hydrochlorothiazide and Lasix as an outpatient. Started on metoprolol inpatient. -Continue metoprolol  Right upper lobe opacities Concerning for possible infection. Asymptomatic at this time. Started on Ceftriaxone and Flagyl for CAP/aspiration pneumonia -Continue Ceftriaxone  Diabetes mellitus, type 2 Hemoglobin A1C of 7.7%. Patient on glipizide.  Parkinson disease -Continue Sinemet CR  Hypothyroidism -Continue Synthroid  BPH -Continue tamsulosin  Chronic pancreatic insufficiency -Continue Creon  T5 and T12 vertebral compression fracture Incidental finding. Chronic.   Aortic atherosclerosis Noted on CT imaging.  Bilateral pleural plaques Pulmonary fibrosis Per CT read, consistent with asbestos related pleural disease. Follow-up with pulmonology outpatient.  3.5 cm RLL mass Newly noted. Rounded atelectasis vs pulmonary neoplasm. Recommendation for follow-up in 3 months  Pressure injury Right perineum, POA. -Wound care per nursing    DVT prophylaxis: SCDs Code Status:   Code Status: DNR Family Communication: None at bedside Disposition Plan: Discharge possibly to CIR pending management of SBO   Consultants:  General surgery Cardiology Neurology  Procedures:  None  Antimicrobials: Ceftriaxone Flagyl    Subjective: Continues to pass gas. Having small bowel movements. No abdominal pain. No emesis.  Objective: BP 115/62   Pulse 66   Temp 98 F (36.7 C) (Oral)   Resp 16   Ht _0  (1.727 m)   Wt 75.4 kg   SpO2 100%   BMI 25.27 kg/m   Examination:  General exam: Appears calm and comfortable Respiratory system: Clear to auscultation. Respiratory effort normal. Cardiovascular system: S1 & S2 heard,  RRR. No murmurs. Gastrointestinal system: Abdomen is distended, soft and non-tender. Normal bowel sounds heard. Central nervous system: Alert and oriented. No focal neurological deficits. Musculoskeletal: No edema. No  calf tenderness Skin: No cyanosis. No rashes Psychiatry: Judgement and insight appear normal. Mood & affect appropriate.    Data Reviewed: I have personally reviewed following labs and imaging studies  CBC Lab Results  Component Value Date   WBC 4.6 02/23/2022   RBC 3.61 (L) 02/23/2022   HGB 11.7 (L) 02/23/2022   HCT 35.1 (L) 02/23/2022   MCV 97.2 02/23/2022   MCH 32.4 02/23/2022   PLT 159 02/23/2022   MCHC 33.3 02/23/2022   RDW 14.2 02/23/2022   LYMPHSABS 0.8 02/22/2022   MONOABS 0.7 02/22/2022   EOSABS 0.0 02/22/2022   BASOSABS 0.0 77/41/2878     Last metabolic panel Lab Results  Component Value Date   NA 142 02/25/2022   K 2.8 (L) 02/25/2022   CL 109 02/25/2022   CO2 20 (L) 02/25/2022   BUN 78 (H) 02/25/2022   CREATININE 2.26 (H) 02/25/2022   GLUCOSE 207 (H) 02/25/2022   GFRNONAA 26 (L) 02/25/2022   GFRAA  04/26/2009    >60        The eGFR has been calculated using the MDRD equation. This calculation has not been validated in all clinical situations. eGFR's persistently <60 mL/min signify possible Chronic Kidney Disease.   CALCIUM 7.8 (L) 02/25/2022   PROT 6.8 02/22/2022   ALBUMIN 3.4 (L) 02/22/2022   BILITOT 1.2 02/22/2022   ALKPHOS 55 02/22/2022   AST 18 02/22/2022   ALT 18 02/22/2022   ANIONGAP 13 02/25/2022    GFR: Estimated Creatinine Clearance: 18.9 mL/min (A) (by C-G formula based on SCr of 2.26 mg/dL (H)).  No results found for this or any previous visit (from the past 240 hour(s)).    Radiology Studies: DG Abd Portable 1V  Result Date: 02/25/2022 CLINICAL DATA:  Abdominal distention EXAM: PORTABLE ABDOMEN - 1 VIEW COMPARISON:  Previous studies including the examination of 02/24/2022 FINDINGS: There is abnormal dilation of  small-bowel loops measuring up to 6.1 cm in maximum diameter. Tip NG tube is seen in the stomach. Colon is not distended. Overall, no significant interval changes are noted. Surgical clips are seen in the right upper quadrant. IMPRESSION: Findings suggest partial small bowel obstruction with no significant interval change. Electronically Signed   By: Elmer Picker M.D.   On: 02/25/2022 11:08      LOS: 3 days    Cordelia Poche, MD [facility] 02/26/2022, 1:05 PM   If 7PM-7AM, please contact night-coverage [email or web address]

## 2022-02-27 ENCOUNTER — Inpatient Hospital Stay (HOSPITAL_COMMUNITY): Payer: PPO

## 2022-02-27 DIAGNOSIS — E1122 Type 2 diabetes mellitus with diabetic chronic kidney disease: Secondary | ICD-10-CM | POA: Diagnosis not present

## 2022-02-27 DIAGNOSIS — I480 Paroxysmal atrial fibrillation: Secondary | ICD-10-CM | POA: Diagnosis not present

## 2022-02-27 DIAGNOSIS — R531 Weakness: Secondary | ICD-10-CM | POA: Diagnosis not present

## 2022-02-27 DIAGNOSIS — N179 Acute kidney failure, unspecified: Secondary | ICD-10-CM | POA: Diagnosis not present

## 2022-02-27 LAB — CBC
HCT: 32.8 % — ABNORMAL LOW (ref 39.0–52.0)
Hemoglobin: 11 g/dL — ABNORMAL LOW (ref 13.0–17.0)
MCH: 32.8 pg (ref 26.0–34.0)
MCHC: 33.5 g/dL (ref 30.0–36.0)
MCV: 97.9 fL (ref 80.0–100.0)
Platelets: 131 10*3/uL — ABNORMAL LOW (ref 150–400)
RBC: 3.35 MIL/uL — ABNORMAL LOW (ref 4.22–5.81)
RDW: 14.6 % (ref 11.5–15.5)
WBC: 10.6 10*3/uL — ABNORMAL HIGH (ref 4.0–10.5)
nRBC: 0 % (ref 0.0–0.2)

## 2022-02-27 LAB — BASIC METABOLIC PANEL
Anion gap: 7 (ref 5–15)
BUN: 56 mg/dL — ABNORMAL HIGH (ref 8–23)
CO2: 18 mmol/L — ABNORMAL LOW (ref 22–32)
Calcium: 7.6 mg/dL — ABNORMAL LOW (ref 8.9–10.3)
Chloride: 115 mmol/L — ABNORMAL HIGH (ref 98–111)
Creatinine, Ser: 1.99 mg/dL — ABNORMAL HIGH (ref 0.61–1.24)
GFR, Estimated: 30 mL/min — ABNORMAL LOW (ref >60)
Glucose, Bld: 130 mg/dL — ABNORMAL HIGH (ref 70–99)
Potassium: 3.4 mmol/L — ABNORMAL LOW (ref 3.5–5.1)
Sodium: 140 mmol/L (ref 135–145)

## 2022-02-27 LAB — GLUCOSE, CAPILLARY
Glucose-Capillary: 123 mg/dL — ABNORMAL HIGH (ref 70–99)
Glucose-Capillary: 94 mg/dL (ref 70–99)
Glucose-Capillary: 116 mg/dL — ABNORMAL HIGH (ref 70–99)
Glucose-Capillary: 122 mg/dL — ABNORMAL HIGH (ref 70–99)

## 2022-02-27 LAB — MAGNESIUM: Magnesium: 1.8 mg/dL (ref 1.7–2.4)

## 2022-02-27 LAB — PREALBUMIN: Prealbumin: 5.2 mg/dL — ABNORMAL LOW (ref 18–38)

## 2022-02-27 MED ORDER — POTASSIUM CHLORIDE 10 MEQ/100ML IV SOLN
10.0000 meq | INTRAVENOUS | Status: AC
  Administered 2022-02-27 (×4): 10 meq via INTRAVENOUS
  Filled 2022-02-27 (×4): qty 100

## 2022-02-27 MED ORDER — MEDIHONEY WOUND/BURN DRESSING EX PSTE
1.0000 "application " | PASTE | Freq: Every day | CUTANEOUS | Status: DC
  Administered 2022-02-28 – 2022-03-09 (×11): 1 via TOPICAL
  Filled 2022-02-27: qty 44

## 2022-02-27 MED ORDER — MAGNESIUM SULFATE 2 GM/50ML IV SOLN
2.0000 g | Freq: Once | INTRAVENOUS | Status: AC
Start: 2022-02-27 — End: 2022-02-27
  Administered 2022-02-27: 2 g via INTRAVENOUS
  Filled 2022-02-27: qty 50

## 2022-02-27 MED ORDER — DIATRIZOATE MEGLUMINE & SODIUM 66-10 % PO SOLN
90.0000 mL | Freq: Once | ORAL | Status: AC
  Administered 2022-02-27: 90 mL via NASOGASTRIC
  Filled 2022-02-27: qty 90

## 2022-02-27 NOTE — Progress Notes (Signed)
Inpatient Rehab Admissions Coordinator:   Met with patient at bedside and discussed with surgery PA.  NG still to intermittent suction.  Still hoping for bowel obstruction to improve.  Will continue to follow for timing of insurance auth initiation.   Shann Medal, PT, DPT Admissions Coordinator [phone] 02/27/22  1:00 PM

## 2022-02-27 NOTE — Evaluation (Signed)
Speech Language Pathology Evaluation Patient Details Name: RAYLEN TANGONAN MRN: [id number] DOB: 05-Sep-1925 Today's Date: 02/27/2022 Time: 8250-5397 SLP Time Calculation (min) (ACUTE ONLY): 12 min  Problem List:  Patient Active Problem List   Diagnosis Date Noted   Pressure injury of skin 02/26/2022   Paroxysmal atrial fibrillation ([place]) 02/22/2022   Generalized weakness 02/22/2022   Nausea & vomiting 02/22/2022   Acute kidney injury superimposed on chronic kidney disease ([place]) 02/22/2022   Anemia 07/06/2021   Atrial flutter ([place]) 07/06/2021   Disease of pancreas 07/06/2021   Encounter for immunization 07/06/2021   Enlarged prostate 07/06/2021   Hyperlipidemia 07/06/2021   Malignant neoplasm of colon ([place]) 07/06/2021   Other B-complex deficiencies 07/06/2021   Peripheral vascular disease ([place]) 07/06/2021   Pure hypercholesterolemia 07/06/2021   At moderate risk for fall 08/05/2019   PD (Parkinson's disease) ([place]) 01/15/2018   Dyslipidemia 04/08/2017   Major depressive episode 04/19/2016   CKD stage 3 due to type 2 diabetes mellitus ([place]) 08/25/2015   Spinal stenosis of lumbar region 05/25/2015   BPH (benign prostatic hyperplasia) 11/10/2013   Bilateral lumbar radiculopathy 11/10/2013   Obesity (BMI 30-39.9) 04/21/2013   Dyspnea 06/23/2011   PAF (paroxysmal atrial fibrillation) ([place]) 06/23/2011   SKIN RASH 01/17/2010   ABNORMAL THYROID FUNCTION TESTS 01/17/2010   EDEMA LEG 05/04/2009   GALLSTONES 04/07/2009   ABDOMINAL PAIN RIGHT UPPER QUADRANT 04/07/2009   DIARRHEA 02/26/2009   Type 2 diabetes mellitus, controlled ([place]) 12/01/2008   Essential hypertension 12/01/2008   ALLERGIC RHINITIS 12/01/2008   GERD 12/01/2008   SPONDYLOSIS, LUMBAR 12/01/2008   COLONIC POLYPS, HX OF 12/01/2008   Past Medical History:  Past Medical History:  Diagnosis Date   ABNORMAL THYROID FUNCTION TESTS 01/17/2010   ALLERGIC RHINITIS 12/01/2008   Colon cancer ([place])    COLONIC POLYPS, HX OF  12/01/2008   DIABETES MELLITUS, TYPE II 12/01/2008   EDEMA LEG 05/04/2009   ESSENTIAL HYPERTENSION 12/01/2008   Exocrine pancreatic insufficiency    GALLSTONES 04/07/2009   GERD 12/01/2008   Hay fever    SPONDYLOSIS, LUMBAR 12/01/2008   Past Surgical History:  Past Surgical History:  Procedure Laterality Date   ANKLE SURGERY Right    CATARACT EXTRACTION     CHOLECYSTECTOMY     COLON SURGERY     resection 1990 for cancer   LEG SURGERY Left    HPI:  88 yo male presenting 6/7 with weakness, fatigue, poor appetite, and fall while walking to the bathroom on day of admission. Pt found to be in afib with frequent PVCs (new after ablation 20 years ago). CT revealed mid/distal SBO, NG tube placed 6/7. MRI revealed R cerebellar infarct, possible subacute right frontal lobe CVA. PMH includes: DM II, HLD, PVD, PD, PAF, CKD III, colon cancer, and BPH.   Assessment / Plan / Recommendation Clinical Impression  Pt demonstrates mild cognitive dysfunction and mild-moderate dysarthria. Overall, basic cognition is functional for this environment. He was able to state and demonstrate simple problem solving and safety awareness via verbal means (not performance). Difficulty naming adequate number of items per category. He is oriented to self, time, needing choice for place and not able to state reason for admission other than retaining urine (?). Simple reasoning was functional. Pt is aware of his dysarthric speech noting different than baseline. From cognitive standpoint he is functional for this environment and would benefit from assessment in next level of care. ST will follow pt for dysarthria.    SLP Assessment  SLP Recommendation/Assessment: Patient needs continued Speech Lanaguage Pathology Services SLP Visit Diagnosis: Dysarthria and anarthria (R47.1)    Recommendations for follow up therapy are one component of a multi-disciplinary discharge planning process, led by the attending physician.   Recommendations may be updated based on patient status, additional functional criteria and insurance authorization.    Follow Up Recommendations   (TBD)    Assistance Recommended at Discharge  Intermittent Supervision/Assistance  Functional Status Assessment Patient has had a recent decline in their functional status and demonstrates the ability to make significant improvements in function in a reasonable and predictable amount of time.  Frequency and Duration min 1 x/week  2 weeks      SLP Evaluation Cognition  Overall Cognitive Status: No family/caregiver present to determine baseline cognitive functioning Arousal/Alertness: Awake/alert Orientation Level: Oriented to person;Oriented to place;Oriented to time (needed cues for building, not oriented to stroke or SBO but retaining urine) Year: 2023 Month: June Day of Week: Correct Attention: Sustained Sustained Attention: Appears intact Memory:  (TBA) Awareness:  (intact for dysarthria) Problem Solving: Appears intact (basic) Safety/Judgment: Appears intact (verbal info)       Comprehension  Auditory Comprehension Overall Auditory Comprehension: Appears within functional limits for tasks assessed Visual Recognition/Discrimination Discrimination: Not tested Reading Comprehension Reading Status: Not tested    Expression Expression Primary Mode of Expression: Verbal Verbal Expression Overall Verbal Expression: Appears within functional limits for tasks assessed Initiation: No impairment Level of Generative/Spontaneous Verbalization: Conversation Repetition:  (NT) Naming:  (decreased divergent naming) Pragmatics: No impairment Written Expression Dominant Hand: Right Written Expression: Not tested   Oral / Motor  Oral Motor/Sensory Function Overall Oral Motor/Sensory Function: Mild impairment Facial ROM: Within Functional Limits Facial Symmetry: Within Functional Limits Facial Strength: Within Functional Limits Lingual  ROM: Reduced left Lingual Symmetry: Abnormal symmetry left Lingual Strength: Reduced Mandible: Within Functional Limits Motor Speech Overall Motor Speech: Impaired Respiration: Within functional limits Phonation: Low vocal intensity;Hoarse Resonance: Within functional limits Articulation: Impaired Level of Impairment: Phrase Intelligibility: Intelligibility reduced Word: 75-100% accurate Phrase: 50-74% accurate Sentence: 50-74% accurate Conversation: 50-74% accurate Motor Planning: Impaired Level of Impairment: Phrase Motor Speech Errors: Aware;Consistent            Houston Siren 02/27/2022, 11:35 AM

## 2022-02-27 NOTE — Consult Note (Signed)
WOC Nurse Consult Note: Reason for Consult: Unstageable pressure injury to coccyx. Wound was not properly identified on admission and WOC consult was performed on 02/23/22 by my associate remotely based on the reported area of skin loss. Wound type:pressure plus shear force skin injury Pressure Injury POA: Yes Measurement: 1.8cm x 1.5cm with nonviable wound bed and I am not able to determine wound depth. Wound bed:As noted above, yellow slough Drainage (amount, consistency, odor) small amount serous to light yellow exudate on old dressing Periwound: intact, mild erythema Dressing procedure/placement/frequency: I will implement a POC to autolytically debride the nonviable tissue using MediHoney once daily. Turning and repositioning is in place and I have provided additional guidance for Nursing to minimize time in the supine position. Heel are to be floated for PI prevention and Prevalon offloading heel boots are provided. A pressure redistribution chair cushion is provided for OOB use in the chair, both here and post discharge.  [facility] nursing team will not follow, but will remain available to this patient, the nursing and medical teams.  Please re-consult if needed.  Thank you for inviting [place] to participate in this patient's Plan of Care.  Maudie Flakes, MSN, RN, CNS, [place], Serita Grammes, [employer], [employer] phone:  [phone]

## 2022-02-27 NOTE — Progress Notes (Signed)
PROGRESS NOTE    Steven Ward  GGE:[id number] DOB: [birth year] DOA: 02/22/2022 PCP: [name], MD   Brief Narrative: Steven Ward is a 88 y.o. male with medical history significant of paroxysmal A-fib not on anticoagulation, hypertension, hypothyroidism, Parkinson's disease, lumbar spondylosis, type 2 diabetes, hyperlipidemia, PVD, CKD stage IIIb, remote history of colon cancer, BPH, pancreatic insufficiency, gallstones, GERD. Patient presented secondary to generalized weakness and found to have evidence of atrial fibrillation, SBO and acute infarct. General surgery consulted; NPO with NG tube placed. Cardiology consulted and patient's rate managed with beta blocker. Neurology consulted for stroke.   Assessment and Plan:  Paroxysmal atrial fibrillation Rate controlled. Cardiology consulted. Transthoracic Echocardiogram ordered and significant for severely dilated left atrium with normal LVEF. Anticoagulation deferred secondary to acute infarct, per cardiology. Started on metoprolol PO -Cardiology recommendations: Metoprolol -Start Eliquis once okay per general surgery  Small bowel obstruction Noted on CT abdomen/pelvis on 6/7. General surgery consulted. NG tube placed and intermittent suction applied. NPO. -General surgery recommendations: SBO protocol, NG tube unclamped and suction reapplied -If not able to advance diet, may need to consider TPN on 6/13; discussed with general surgery service  Hypokalemia Improved with supplementation and now low -Potassium supplementation  Hypomagnesemia -Magnesium supplementation. -Recheck potassium this afternoon. -Check magnesium  AKI on CKD stage IIIb Baseline creatinine of about 1.7-2. Creatinine of 2.47 on admission with peak of 2.5. Started on IV fluids. Back to baseline. -Continue IV fluids -BMP in AM  Acute CVA Acute punctate infarct noted on MRI located within the right cerebellar hemisphere in addition to possible  subacute infarct of right frontal lobe. LDL of 34. Hemoglobin A1C of 7.7%. Transthoracic Echocardiogram with normal LVEF of 55-60% with no evidence of atrial level shunt. -Neurology recommendations: Aspirin 81 mg, Eliquis. Outpatient follow-up. Signed off. -PT/OT recommending CIR -SLP pending (complicated by SBO)  Generalized weakness PT recommending CIR. Consult placed.  Primary hypertension Patient is on hydrochlorothiazide and Lasix as an outpatient. Started on metoprolol inpatient. -Continue metoprolol  Right upper lobe opacities Concerning for possible infection. Asymptomatic at this time. Started on Ceftriaxone and Flagyl for CAP/aspiration pneumonia -Continue Ceftriaxone  Diabetes mellitus, type 2 Hemoglobin A1C of 7.7%. Patient on glipizide.  Parkinson disease -Continue Sinemet CR  Hypothyroidism -Continue Synthroid  BPH -Continue tamsulosin  Chronic pancreatic insufficiency -Continue Creon  T5 and T12 vertebral compression fracture Incidental finding. Chronic.   Aortic atherosclerosis Noted on CT imaging.  Bilateral pleural plaques Pulmonary fibrosis Per CT read, consistent with asbestos related pleural disease. Follow-up with pulmonology outpatient.  3.5 cm RLL mass Newly noted. Rounded atelectasis vs pulmonary neoplasm. Recommendation for follow-up in 3 months  Pressure injury Right perineum, POA. -Wound care per nursing    DVT prophylaxis: SCDs Code Status:   Code Status: DNR Family Communication: None at bedside Disposition Plan: Discharge possibly to CIR pending management of SBO   Consultants:  General surgery Cardiology Neurology  Procedures:  None  Antimicrobials: Ceftriaxone Flagyl    Subjective: Patient reports no nausea or vomiting. He is passing gas. Small BMs only  Objective: BP (!) 117/50 (BP Location: Left Arm)   Pulse 81   Temp 97.9 F (36.6 C) (Oral)   Resp 16   Ht $R'5\' 8"'MK$  (1.727 m)   Wt 75.4 kg   SpO2 98%   BMI  25.27 kg/m   Examination:  General exam: Appears calm and comfortable Respiratory system: Clear to auscultation. Respiratory effort normal. Cardiovascular system: S1 & S2 heard, RRR.  No murmur Gastrointestinal system: Abdomen is distended, soft and non-tender. Normal bowel sounds heard. Central nervous system: Alert and oriented. No focal neurological deficits. Musculoskeletal: No edema. No calf tenderness   Data Reviewed: I have personally reviewed following labs and imaging studies  CBC Lab Results  Component Value Date   WBC 10.6 (H) 02/27/2022   RBC 3.35 (L) 02/27/2022   HGB 11.0 (L) 02/27/2022   HCT 32.8 (L) 02/27/2022   MCV 97.9 02/27/2022   MCH 32.8 02/27/2022   PLT 131 (L) 02/27/2022   MCHC 33.5 02/27/2022   RDW 14.6 02/27/2022   LYMPHSABS 0.8 02/22/2022   MONOABS 0.7 02/22/2022   EOSABS 0.0 02/22/2022   BASOSABS 0.0 20/25/4270     Last metabolic panel Lab Results  Component Value Date   NA 140 02/27/2022   K 3.4 (L) 02/27/2022   CL 115 (H) 02/27/2022   CO2 18 (L) 02/27/2022   BUN 56 (H) 02/27/2022   CREATININE 1.99 (H) 02/27/2022   GLUCOSE 130 (H) 02/27/2022   GFRNONAA 30 (L) 02/27/2022   GFRAA  04/26/2009    >60        The eGFR has been calculated using the MDRD equation. This calculation has not been validated in all clinical situations. eGFR's persistently <60 mL/min signify possible Chronic Kidney Disease.   CALCIUM 7.6 (L) 02/27/2022   PROT 6.8 02/22/2022   ALBUMIN 3.4 (L) 02/22/2022   BILITOT 1.2 02/22/2022   ALKPHOS 55 02/22/2022   AST 18 02/22/2022   ALT 18 02/22/2022   ANIONGAP 7 02/27/2022    GFR: Estimated Creatinine Clearance: 21.5 mL/min (A) (by C-G formula based on SCr of 1.99 mg/dL (H)).  No results found for this or any previous visit (from the past 240 hour(s)).    Radiology Studies: DG Abd Portable 1V  Result Date: 02/27/2022 CLINICAL DATA:  Follow-up small bowel obstruction. EXAM: PORTABLE ABDOMEN - 1 VIEW  COMPARISON:  02/26/2022 FINDINGS: Nasogastric tube is partially visualized in the stomach although the distal tip is not seen. Mild small bowel dilatation shows no significant change. Small amount of oral contrast material as well as gas is seen throughout the colon. These findings are consistent with an ileus, without significant change. IMPRESSION: Ileus pattern, without significant change. Nasogastric tube partially visualized in stomach. Electronically Signed   By: Marlaine Hind M.D.   On: 02/27/2022 08:07   DG Abd Portable 1V  Result Date: 02/26/2022 CLINICAL DATA:  NG tube placement EXAM: PORTABLE ABDOMEN - 1 VIEW COMPARISON:  Radiograph 02/25/2022 FINDINGS: NG tube with tip in the gastric fundus. Side port below the GE junction. Gas-filled loops of colon and small bowel noted. RIGHT basilar atelectasis. IMPRESSION: NG tube in gastric fundus Electronically Signed   By: Suzy Bouchard M.D.   On: 02/26/2022 16:17      LOS: 4 days    Cordelia Poche, MD [facility] 02/27/2022, 2:03 PM   If 7PM-7AM, please contact night-coverage [email or web address]

## 2022-02-27 NOTE — Progress Notes (Addendum)
Patient ID: Steven Ward, male   DOB: 27-Jun-1925, 88 y.o.   MRN: [id number] [facility] Surgery Progress Note     Subjective: No complaints this morning.  No nausea.  Has had at least 1 BM each day for the last 4 days.  Minimal NGT output  Objective: Vital signs in last 24 hours: Temp:  [97.5 F (36.4 C)-98 F (36.7 C)] 97.5 F (36.4 C) (06/12 0407) Pulse Rate:  [62-95] 62 (06/12 0407) Resp:  [16] 16 (06/12 0407) BP: (101-115)/(47-62) 110/47 (06/12 0407) SpO2:  [98 %-100 %] 98 % (06/12 0407) Weight:  [75.4 kg] 75.4 kg (06/12 0407) Last BM Date : 02/24/22  Intake/Output from previous day: 06/11 0701 - 06/12 0700 In: 2251.9 [I.V.:2151.9; IV Piggyback:100] Out: 1000 [Urine:700; Emesis/NG output:300] Intake/Output this shift: No intake/output data recorded.  PE: Gen:  Alert, NAD, pleasant Abd: soft, not distended, nontender, few BS heard, NGT with some red output, doesn't look overtly bloody, looks more like he had a red icee or cranberry juice, but he denies  Lab Results:  Recent Labs    02/27/22 0210  WBC 10.6*  HGB 11.0*  HCT 32.8*  PLT 131*   BMET Recent Labs    02/25/22 1648 02/26/22 1425 02/27/22 0210  NA 142  --  140  K 2.8* 4.2 3.4*  CL 109  --  115*  CO2 20*  --  18*  GLUCOSE 207*  --  130*  BUN 78*  --  56*  CREATININE 2.26*  --  1.99*  CALCIUM 7.8*  --  7.6*   PT/INR No results for input(s): "LABPROT", "INR" in the last 72 hours. CMP     Component Value Date/Time   NA 140 02/27/2022 0210   K 3.4 (L) 02/27/2022 0210   CL 115 (H) 02/27/2022 0210   CO2 18 (L) 02/27/2022 0210   GLUCOSE 130 (H) 02/27/2022 0210   BUN 56 (H) 02/27/2022 0210   CREATININE 1.99 (H) 02/27/2022 0210   CALCIUM 7.6 (L) 02/27/2022 0210   PROT 6.8 02/22/2022 0847   ALBUMIN 3.4 (L) 02/22/2022 0847   AST 18 02/22/2022 0847   ALT 18 02/22/2022 0847   ALKPHOS 55 02/22/2022 0847   BILITOT 1.2 02/22/2022 0847   GFRNONAA 30 (L) 02/27/2022 0210   GFRAA  04/26/2009  0920    >60        The eGFR has been calculated using the MDRD equation. This calculation has not been validated in all clinical situations. eGFR's persistently <60 mL/min signify possible Chronic Kidney Disease.   Lipase  No results found for: "LIPASE"     Studies/Results: DG Abd Portable 1V  Result Date: 02/26/2022 CLINICAL DATA:  NG tube placement EXAM: PORTABLE ABDOMEN - 1 VIEW COMPARISON:  Radiograph 02/25/2022 FINDINGS: NG tube with tip in the gastric fundus. Side port below the GE junction. Gas-filled loops of colon and small bowel noted. RIGHT basilar atelectasis. IMPRESSION: NG tube in gastric fundus Electronically Signed   By: Suzy Bouchard M.D.   On: 02/26/2022 16:17   DG Abd Portable 1V  Result Date: 02/25/2022 CLINICAL DATA:  Abdominal distention EXAM: PORTABLE ABDOMEN - 1 VIEW COMPARISON:  Previous studies including the examination of 02/24/2022 FINDINGS: There is abnormal dilation of small-bowel loops measuring up to 6.1 cm in maximum diameter. Tip NG tube is seen in the stomach. Colon is not distended. Overall, no significant interval changes are noted. Surgical clips are seen in the right upper quadrant. IMPRESSION: Findings suggest partial small  bowel obstruction with no significant interval change. Electronically Signed   By: Elmer Picker M.D.   On: 02/25/2022 11:08    Anti-infectives: Anti-infectives (From admission, onward)    Start     Dose/Rate Route Frequency Ordered Stop   02/23/22 0000  cefTRIAXone (ROCEPHIN) 2 g in sodium chloride 0.9 % 100 mL IVPB        2 g 200 mL/hr over 30 Minutes Intravenous Daily at 10 pm 02/22/22 2346     02/23/22 0000  metroNIDAZOLE (FLAGYL) IVPB 500 mg  Status:  Discontinued        500 mg 100 mL/hr over 60 Minutes Intravenous 2 times daily 02/22/22 2346 02/23/22 1802        Assessment/Plan Partial SBO -protocol showed contrast in the colon despite small bowel still being significantly dilated -Having bowel  function and less distended today -x-ray stable with contrast in colon but still with dilated SB loops. -low dose reglan added yesterday -would like to give more time in the setting of age, comorbidities, and new CVA this admit.  Surgery would not be low risk for him. -could retry protocol, but not sure how much help that will be given he has contrast clearly in his colon currently, but could help with bowel edema and improving small bowel. -prealbumin is 5.  May want to consider picc and TNA for nutritional support while unable to take in orals.  D/w primary service -replace K due to low and can affect gut motility   FEN - NG/NPO, IVF VTE - hold eliquis, ok for heparin gtt or chemical prophylaxis from our standpoint ID - rocephin 6/8>>   New CVA this admit DM HTN Pancreatic insufficiency A fib on eliquis  Parkinson's disease Hypothyroidism BPH Pulmonary fibrosis/RLL mass   I reviewed hospitalist notes, last 24 h vitals and pain scores, last 48 h intake and output, last 24 h labs and trends, and last 24 h imaging results.     LOS: 4 days    [name], [facility] 02/27/2022, 8:08 AM Please see Amion for pager number during day hours 7:00am-4:30pm

## 2022-02-27 NOTE — Care Management Important Message (Signed)
Important Message  Patient Details  Name: ALDRIDGE KRZYZANOWSKI MRN: [id number] Date of Birth: 12/01/1924   Medicare Important Message Given:  Yes     [name] 02/27/2022, 7:56 AM

## 2022-02-28 ENCOUNTER — Inpatient Hospital Stay (HOSPITAL_COMMUNITY): Payer: PPO

## 2022-02-28 ENCOUNTER — Inpatient Hospital Stay: Payer: Self-pay

## 2022-02-28 DIAGNOSIS — R531 Weakness: Secondary | ICD-10-CM | POA: Diagnosis not present

## 2022-02-28 DIAGNOSIS — E1122 Type 2 diabetes mellitus with diabetic chronic kidney disease: Secondary | ICD-10-CM | POA: Diagnosis not present

## 2022-02-28 DIAGNOSIS — I480 Paroxysmal atrial fibrillation: Secondary | ICD-10-CM | POA: Diagnosis not present

## 2022-02-28 DIAGNOSIS — N179 Acute kidney failure, unspecified: Secondary | ICD-10-CM | POA: Diagnosis not present

## 2022-02-28 LAB — BASIC METABOLIC PANEL
Anion gap: 12 (ref 5–15)
BUN: 51 mg/dL — ABNORMAL HIGH (ref 8–23)
CO2: 18 mmol/L — ABNORMAL LOW (ref 22–32)
Calcium: 8 mg/dL — ABNORMAL LOW (ref 8.9–10.3)
Chloride: 110 mmol/L (ref 98–111)
Creatinine, Ser: 1.96 mg/dL — ABNORMAL HIGH (ref 0.61–1.24)
GFR, Estimated: 31 mL/min — ABNORMAL LOW (ref >60)
Glucose, Bld: 102 mg/dL — ABNORMAL HIGH (ref 70–99)
Potassium: 3.6 mmol/L (ref 3.5–5.1)
Sodium: 140 mmol/L (ref 135–145)

## 2022-02-28 LAB — GLUCOSE, CAPILLARY
Glucose-Capillary: 84 mg/dL (ref 70–99)
Glucose-Capillary: 92 mg/dL (ref 70–99)
Glucose-Capillary: 101 mg/dL — ABNORMAL HIGH (ref 70–99)
Glucose-Capillary: 148 mg/dL — ABNORMAL HIGH (ref 70–99)

## 2022-02-28 LAB — MAGNESIUM: Magnesium: 2.1 mg/dL (ref 1.7–2.4)

## 2022-02-28 MED ORDER — SODIUM CHLORIDE 0.45 % IV SOLN: INTRAVENOUS | Status: AC

## 2022-02-28 MED ORDER — INSULIN ASPART 100 UNIT/ML IJ SOLN
0.0000 [IU] | INTRAMUSCULAR | Status: DC
  Administered 2022-02-28: 1 [IU] via SUBCUTANEOUS
  Administered 2022-03-01 (×3): 2 [IU] via SUBCUTANEOUS
  Administered 2022-03-01: 3 [IU] via SUBCUTANEOUS
  Administered 2022-03-01 (×2): 2 [IU] via SUBCUTANEOUS
  Administered 2022-03-02 (×2): 3 [IU] via SUBCUTANEOUS

## 2022-02-28 MED ORDER — LIP MEDEX EX OINT: TOPICAL_OINTMENT | CUTANEOUS | Status: DC | PRN

## 2022-02-28 MED ORDER — POTASSIUM CHLORIDE 10 MEQ/100ML IV SOLN
10.0000 meq | INTRAVENOUS | Status: AC
  Administered 2022-02-28 (×4): 10 meq via INTRAVENOUS
  Filled 2022-02-28 (×4): qty 100

## 2022-02-28 MED ORDER — CHLORHEXIDINE GLUCONATE CLOTH 2 % EX PADS
6.0000 | MEDICATED_PAD | Freq: Every day | CUTANEOUS | Status: DC
  Administered 2022-02-28 – 2022-03-08 (×9): 6 via TOPICAL

## 2022-02-28 MED ORDER — SODIUM CHLORIDE 0.9% FLUSH: 10.0000 mL | INTRAVENOUS | Status: DC | PRN

## 2022-02-28 MED ORDER — SODIUM CHLORIDE 0.9% FLUSH
10.0000 mL | Freq: Two times a day (BID) | INTRAVENOUS | Status: DC
  Administered 2022-02-28 – 2022-03-05 (×12): 10 mL
  Administered 2022-03-06 (×2): 20 mL
  Administered 2022-03-07 – 2022-03-08 (×3): 10 mL

## 2022-02-28 MED ORDER — TRAVASOL 10 % IV SOLN
INTRAVENOUS | Status: AC
  Filled 2022-02-28: qty 378

## 2022-02-28 NOTE — Progress Notes (Signed)
Physical Therapy Treatment Patient Details Name: Steven Ward MRN: [id number] DOB: 10/27/1924 Today's Date: 02/28/2022   History of Present Illness The pt is a 88 yo male presenting 6/7 with weakness, fatigue, poor appetite, and fall while walking to the bathroom on day of admission. Pt found to be in afib with frequent PVCs (new after ablation 20 years ago). CT revealed mid/distal SBO, NG tube placed 6/7. MRI revealed R cerebellar infarct. PMH includes: DM II, HLD, PVD, PD, PAF, CKD III, colon cancer, and BPH.    PT Comments    Pt continues to be on NG tube to suction. PICC line placed and TPN to start this afternoon.  Pt very flat and expresses concern about his intestines not working. Agreeable to work with therapy, requests to go to [facility] first. Pt requires modA for bed mobility and transfer from bed to [facility] and BSC to chair. Pt too fatigued to progress mobility. D/c plan remains appropriate. PT will continue to follow acutely.   Recommendations for follow up therapy are one component of a multi-disciplinary discharge planning process, led by the attending physician.  Recommendations may be updated based on patient status, additional functional criteria and insurance authorization.  Follow Up Recommendations  Acute inpatient rehab (3hours/day)     Assistance Recommended at Discharge Frequent or constant Supervision/Assistance  Patient can return home with the following Two people to help with walking and/or transfers;A lot of help with bathing/dressing/bathroom;Assistance with cooking/housework;Direct supervision/assist for medications management;Direct supervision/assist for financial management;Assist for transportation;Help with stairs or ramp for entrance   Equipment Recommendations   (defer to post acute)    Recommendations for Other Services Rehab consult     Precautions / Restrictions Precautions Precautions: Fall Precaution Comments: NG tube Restrictions Weight Bearing  Restrictions: No     Mobility  Bed Mobility Overal bed mobility: Needs Assistance Bed Mobility: Supine to Sit     Supine to sit: Mod assist     General bed mobility comments: modA to manage LE and to elevate trunk,and to scoot hips forward in bed    Transfers Overall transfer level: Needs assistance Equipment used: Rolling walker (2 wheels) Transfers: Sit to/from Stand, Bed to chair/wheelchair/BSC Sit to Stand: Mod assist   Step pivot transfers: Mod assist       General transfer comment: modA for sit<>stand and step pivot to [facility] and then from [facility] to recliner    Ambulation/Gait               General Gait Details: unable to progress due to NG tube to suction and increased fatigue after BSC      Modified Rankin (Stroke Patients Only) Modified Rankin (Stroke Patients Only) Pre-Morbid Rankin Score: Moderately severe disability Modified Rankin: Moderately severe disability     Balance Overall balance assessment: Needs assistance Sitting-balance support: Bilateral upper extremity supported, Feet supported Sitting balance-Leahy Scale: Fair   Postural control: Left lateral lean, Posterior lean Standing balance support: Bilateral upper extremity supported, During functional activity Standing balance-Leahy Scale: Poor Standing balance comment: dependent on BUE support and assist, up to modA due to posterior lean                            Cognition Arousal/Alertness: Awake/alert Behavior During Therapy: Flat affect Overall Cognitive Status: No family/caregiver present to determine baseline cognitive functioning Area of Impairment: Following commands, Safety/judgement, Awareness, Problem solving  Following Commands: Follows multi-step commands with increased time, Follows one step commands with increased time Safety/Judgement: Decreased awareness of safety, Decreased awareness of deficits Awareness: Emergent Problem  Solving: Slow processing, Decreased initiation, Difficulty sequencing, Requires verbal cues General Comments: pt continues to have poor awareness of his position in space, however is able to follow commands with increased time           General Comments General comments (skin integrity, edema, etc.): VSS on RA,      Pertinent Vitals/Pain Pain Assessment Pain Assessment: No/denies pain     PT Goals (current goals can now be found in the care plan section) Acute Rehab PT Goals Patient Stated Goal: none stated PT Goal Formulation: Patient unable to participate in goal setting Time For Goal Achievement: 03/09/22 Potential to Achieve Goals: Good Progress towards PT goals: Progressing toward goals    Frequency    Min 4X/week      PT Plan Current plan remains appropriate       AM-PAC PT "6 Clicks" Mobility   Outcome Measure  Help needed turning from your back to your side while in a flat bed without using bedrails?: A Lot Help needed moving from lying on your back to sitting on the side of a flat bed without using bedrails?: A Lot Help needed moving to and from a bed to a chair (including a wheelchair)?: A Lot Help needed standing up from a chair using your arms (e.g., wheelchair or bedside chair)?: A Lot Help needed to walk in hospital room?: Total (unable to walk 20 ft) Help needed climbing 3-5 steps with a railing? : Total 6 Click Score: 10    End of Session Equipment Utilized During Treatment: Gait belt Activity Tolerance: Patient tolerated treatment well Patient left: in chair;with call bell/phone within reach;with chair alarm set Nurse Communication: Mobility status PT Visit Diagnosis: Other abnormalities of gait and mobility (R26.89);Muscle weakness (generalized) (M62.81);History of falling (Z91.81)     Time: 1445-1511 PT Time Calculation (min) (ACUTE ONLY): 26 min  Charges:  $Therapeutic Activity: 23-37 mins                     Steven Ward B. Migdalia Dk PT,  DPT Acute Rehabilitation Services Please use secure chat or  Call Office [phone]    [place] 02/28/2022, 5:17 PM

## 2022-02-28 NOTE — Progress Notes (Signed)
Patient ID: Steven Ward, male   DOB: 06/07/1925, 88 y.o.   MRN: [id number] [facility] Surgery Progress Note     Subjective: No complaints this morning.  Has had several bowel movements.  Mild nausea with NGT clamped yesterday to administer contrast.  Denies abdominal pain  Objective: Vital signs in last 24 hours: Temp:  [97.2 F (36.2 C)-98 F (36.7 C)] 98 F (36.7 C) (06/13 0438) Pulse Rate:  [71-81] 71 (06/13 0438) Resp:  [16-19] 19 (06/13 0438) BP: (113-119)/(50-61) 113/56 (06/13 0438) SpO2:  [98 %-100 %] 99 % (06/13 0438) Last BM Date : 02/28/22  Intake/Output from previous day: 06/12 0701 - 06/13 0700 In: 271.5 [IV Piggyback:271.5] Out: 700 [Urine:400; Emesis/NG output:300] Intake/Output this shift: No intake/output data recorded.  PE: Gen:  Alert, NAD, pleasant Abd: soft, maybe slightly more distended but still super soft, nontender, few BS heard, NGT with bilious output.  300cc documented, but RN states there was at least a liter from yesterday to this morning.  Lab Results:  Recent Labs    02/27/22 0210  WBC 10.6*  HGB 11.0*  HCT 32.8*  PLT 131*   BMET Recent Labs    02/27/22 0210 02/28/22 0250  NA 140 140  K 3.4* 3.6  CL 115* 110  CO2 18* 18*  GLUCOSE 130* 102*  BUN 56* 51*  CREATININE 1.99* 1.96*  CALCIUM 7.6* 8.0*   PT/INR No results for input(s): "LABPROT", "INR" in the last 72 hours. CMP     Component Value Date/Time   NA 140 02/28/2022 0250   K 3.6 02/28/2022 0250   CL 110 02/28/2022 0250   CO2 18 (L) 02/28/2022 0250   GLUCOSE 102 (H) 02/28/2022 0250   BUN 51 (H) 02/28/2022 0250   CREATININE 1.96 (H) 02/28/2022 0250   CALCIUM 8.0 (L) 02/28/2022 0250   PROT 6.8 02/22/2022 0847   ALBUMIN 3.4 (L) 02/22/2022 0847   AST 18 02/22/2022 0847   ALT 18 02/22/2022 0847   ALKPHOS 55 02/22/2022 0847   BILITOT 1.2 02/22/2022 0847   GFRNONAA 31 (L) 02/28/2022 0250   GFRAA  04/26/2009 0920    >60        The eGFR has been  calculated using the MDRD equation. This calculation has not been validated in all clinical situations. eGFR's persistently <60 mL/min signify possible Chronic Kidney Disease.   Lipase  No results found for: "LIPASE"     Studies/Results: DG Abd Portable 1V  Result Date: 02/28/2022 CLINICAL DATA:  Follow-up small bowel obstruction EXAM: PORTABLE ABDOMEN - 1 VIEW COMPARISON:  02/27/2022.  02/26/2022.  02/25/2022 FINDINGS: Previously seen contrast present throughout the colon. Few dilated loops of small bowel main evident, but the trend remains favorable. Maximal dimension is 5 cm in the left central abdomen. Nasogastric tube tip in the stomach. IMPRESSION: Previously administered contrast present throughout the colon, making slow progression. Continued improvement of the small bowel gas pattern, but with a few persistent gas-filled dilated loops. Electronically Signed   By: Nelson Chimes M.D.   On: 02/28/2022 08:10   DG Abd Portable 1V  Result Date: 02/27/2022 CLINICAL DATA:  Follow-up small bowel obstruction. EXAM: PORTABLE ABDOMEN - 1 VIEW COMPARISON:  02/26/2022 FINDINGS: Nasogastric tube is partially visualized in the stomach although the distal tip is not seen. Mild small bowel dilatation shows no significant change. Small amount of oral contrast material as well as gas is seen throughout the colon. These findings are consistent with an ileus, without significant change.  IMPRESSION: Ileus pattern, without significant change. Nasogastric tube partially visualized in stomach. Electronically Signed   By: Marlaine Hind M.D.   On: 02/27/2022 08:07   DG Abd Portable 1V  Result Date: 02/26/2022 CLINICAL DATA:  NG tube placement EXAM: PORTABLE ABDOMEN - 1 VIEW COMPARISON:  Radiograph 02/25/2022 FINDINGS: NG tube with tip in the gastric fundus. Side port below the GE junction. Gas-filled loops of colon and small bowel noted. RIGHT basilar atelectasis. IMPRESSION: NG tube in gastric fundus  Electronically Signed   By: Suzy Bouchard M.D.   On: 02/26/2022 16:17    Anti-infectives: Anti-infectives (From admission, onward)    Start     Dose/Rate Route Frequency Ordered Stop   02/23/22 0000  cefTRIAXone (ROCEPHIN) 2 g in sodium chloride 0.9 % 100 mL IVPB        2 g 200 mL/hr over 30 Minutes Intravenous Daily at 10 pm 02/22/22 2346     02/23/22 0000  metroNIDAZOLE (FLAGYL) IVPB 500 mg  Status:  Discontinued        500 mg 100 mL/hr over 60 Minutes Intravenous 2 times daily 02/22/22 2346 02/23/22 1802        Assessment/Plan Partial SBO -repeat protocol with contrast throughout the colon, but still with some dilated loops of small bowel and NGT output.  Query a component of bowel motility vs partial obstructive picture.  He is clearly not completely obstructed -still having bowel function -low dose reglan added  -would like to give more time in the setting of age, comorbidities, and new CVA this admit.  Surgery would not be low risk for him. -likely plan for picc and TNA today.  Reached out to primary given his CKD.  They are going to discuss with renal to get an ok for a picc line or at least other recommendations.  At this point, it is unclear when he will be able to take oral nutrition so TNA is appropriate, especially with a prealbumin of 5.    FEN - NGT/NPO, IVF VTE - hold eliquis, ok for heparin gtt or chemical prophylaxis from our standpoint ID - rocephin 6/8>> for CAP   New CVA this admit DM HTN Pancreatic insufficiency A fib on eliquis  Parkinson's disease Hypothyroidism BPH Pulmonary fibrosis/RLL mass   I reviewed hospitalist notes, last 24 h vitals and pain scores, last 48 h intake and output, last 24 h labs and trends, and last 24 h imaging results.     LOS: 5 days    [name], [facility] 02/28/2022, 8:54 AM Please see Amion for pager number during day hours 7:00am-4:30pm

## 2022-02-28 NOTE — Progress Notes (Signed)
PROGRESS NOTE    Steven Ward  JJO:[id number] DOB: 1925/08/22 DOA: 02/22/2022 PCP: [name], MD   Brief Narrative: Steven Ward is a 88 y.o. male with medical history significant of paroxysmal A-fib not on anticoagulation, hypertension, hypothyroidism, Parkinson's disease, lumbar spondylosis, type 2 diabetes, hyperlipidemia, PVD, CKD stage IIIb, remote history of colon cancer, BPH, pancreatic insufficiency, gallstones, GERD. Patient presented secondary to generalized weakness and found to have evidence of atrial fibrillation, SBO and acute infarct. General surgery consulted; NPO with NG tube placed. Cardiology consulted and patient's rate managed with beta blocker. Neurology consulted for stroke. SBO persistent and likely partial. PICC ordered for TPN starting 6/13.   Assessment and Plan:  Paroxysmal atrial fibrillation Rate controlled. Cardiology consulted. Transthoracic Echocardiogram ordered and significant for severely dilated left atrium with normal LVEF. Anticoagulation deferred secondary to acute infarct, per cardiology. Started on metoprolol PO -Cardiology recommendations: Metoprolol 12.5 mg BID -Start Eliquis once okay per general surgery  Small bowel obstruction Noted on CT abdomen/pelvis on 6/7. General surgery consulted. Continue nausea when NG tube is clamped. -General surgery recommendations: Reglan, PICC for TPN (discussed with nephrology)  Hypokalemia Potassium of 3.6. Goal potassium of >4. -Potassium supplementation -Daily BMP  Hypomagnesemia -Magnesium supplementation. Magnesium improved. Goal magnesium of >2 -Daily magnesium  AKI on CKD stage IIIb Baseline creatinine of about 1.7-2. Creatinine of 2.47 on admission with peak of 2.5. Started on IV fluids. Back to baseline and has stabilized. -Continue IV fluids while NPO  Metabolic acidosis In setting of AKI on CKD. Mild. Stabilized CO2. -Follow CO2 on BMP for now; if worsens, can switch to  bicarbonate containing IV fluids  Acute CVA Acute punctate infarct noted on MRI located within the right cerebellar hemisphere in addition to possible subacute infarct of right frontal lobe. LDL of 34. Hemoglobin A1C of 7.7%. Transthoracic Echocardiogram with normal LVEF of 55-60% with no evidence of atrial level shunt. -Neurology recommendations: Aspirin 81 mg, Eliquis. Outpatient follow-up. Signed off. -PT/OT recommending CIR -SLP recommendations to be determind (complicated by SBO)  Generalized weakness PT recommending CIR. Consult placed.  Primary hypertension Patient is on hydrochlorothiazide and Lasix as an outpatient. Started on metoprolol inpatient. -Continue metoprolol  Right upper lobe opacities Concerning for possible infection. Asymptomatic at this time. Started on Ceftriaxone and Flagyl for CAP/aspiration pneumonia. Patient treated for 6 days of Ceftriaxone. Asymptomatic. -Discontinue Ceftriaxone IV  Diabetes mellitus, type 2 Hemoglobin A1C of 7.7%. Patient on glipizide.  Parkinson disease -Continue Sinemet CR  Hypothyroidism -Continue Synthroid  BPH -Continue tamsulosin  Chronic pancreatic insufficiency -Continue Creon  T5 and T12 vertebral compression fracture Incidental finding. Chronic.   Aortic atherosclerosis Noted on CT imaging.  Bilateral pleural plaques Pulmonary fibrosis Per CT read, consistent with asbestos related pleural disease. Follow-up with pulmonology outpatient.  3.5 cm RLL mass Newly noted. Rounded atelectasis vs pulmonary neoplasm. Recommendation for follow-up in 3 months  Pressure injury Right perineum, POA. -Wound care per nursing    DVT prophylaxis: SCDs Code Status:   Code Status: DNR Family Communication: None at bedside Disposition Plan: Discharge possibly to CIR pending management of SBO   Consultants:  General surgery Cardiology Neurology  Procedures:  None  Antimicrobials: Ceftriaxone Flagyl     Subjective: Patient is hoping for some chap stick. No nausea or vomiting. No abdominal pain. Having bowel movements and passing gas.  Objective: BP 128/69 (BP Location: Left Arm)   Pulse 72   Temp (!) 97.4 F (36.3 C) (Oral)   Resp  16   Ht _0  (1.727 m)   Wt 75.4 kg   SpO2 98%   BMI 25.27 kg/m   Examination:  General exam: Appears calm and comfortable Respiratory system: Clear to auscultation. Respiratory effort normal. Cardiovascular system: S1 & S2 heard, RRR. No murmurs, rubs, gallops or clicks. Gastrointestinal system: Abdomen is distended, soft and non-tender. Normal bowel sounds heard. Central nervous system: Alert and oriented. No focal neurological deficits. Musculoskeletal: No calf tenderness Skin: No cyanosis. No rashes Psychiatry: Judgement and insight appear normal. Mood & affect appropriate.    Data Reviewed: I have personally reviewed following labs and imaging studies  CBC Lab Results  Component Value Date   WBC 10.6 (H) 02/27/2022   RBC 3.35 (L) 02/27/2022   HGB 11.0 (L) 02/27/2022   HCT 32.8 (L) 02/27/2022   MCV 97.9 02/27/2022   MCH 32.8 02/27/2022   PLT 131 (L) 02/27/2022   MCHC 33.5 02/27/2022   RDW 14.6 02/27/2022   LYMPHSABS 0.8 02/22/2022   MONOABS 0.7 02/22/2022   EOSABS 0.0 02/22/2022   BASOSABS 0.0 63/89/3734     Last metabolic panel Lab Results  Component Value Date   NA 140 02/28/2022   K 3.6 02/28/2022   CL 110 02/28/2022   CO2 18 (L) 02/28/2022   BUN 51 (H) 02/28/2022   CREATININE 1.96 (H) 02/28/2022   GLUCOSE 102 (H) 02/28/2022   GFRNONAA 31 (L) 02/28/2022   GFRAA  04/26/2009    >60        The eGFR has been calculated using the MDRD equation. This calculation has not been validated in all clinical situations. eGFR's persistently <60 mL/min signify possible Chronic Kidney Disease.   CALCIUM 8.0 (L) 02/28/2022   PROT 6.8 02/22/2022   ALBUMIN 3.4 (L) 02/22/2022   BILITOT 1.2 02/22/2022   ALKPHOS 55 02/22/2022    AST 18 02/22/2022   ALT 18 02/22/2022   ANIONGAP 12 02/28/2022    GFR: Estimated Creatinine Clearance: 21.8 mL/min (A) (by C-G formula based on SCr of 1.96 mg/dL (H)).  No results found for this or any previous visit (from the past 240 hour(s)).    Radiology Studies: [place] EKG SITE RITE  Result Date: 02/28/2022 If Site Rite image not attached, placement could not be confirmed due to current cardiac rhythm.  DG Abd Portable 1V  Result Date: 02/28/2022 CLINICAL DATA:  Follow-up small bowel obstruction EXAM: PORTABLE ABDOMEN - 1 VIEW COMPARISON:  02/27/2022.  02/26/2022.  02/25/2022 FINDINGS: Previously seen contrast present throughout the colon. Few dilated loops of small bowel main evident, but the trend remains favorable. Maximal dimension is 5 cm in the left central abdomen. Nasogastric tube tip in the stomach. IMPRESSION: Previously administered contrast present throughout the colon, making slow progression. Continued improvement of the small bowel gas pattern, but with a few persistent gas-filled dilated loops. Electronically Signed   By: Nelson Chimes M.D.   On: 02/28/2022 08:10   DG Abd Portable 1V  Result Date: 02/27/2022 CLINICAL DATA:  Follow-up small bowel obstruction. EXAM: PORTABLE ABDOMEN - 1 VIEW COMPARISON:  02/26/2022 FINDINGS: Nasogastric tube is partially visualized in the stomach although the distal tip is not seen. Mild small bowel dilatation shows no significant change. Small amount of oral contrast material as well as gas is seen throughout the colon. These findings are consistent with an ileus, without significant change. IMPRESSION: Ileus pattern, without significant change. Nasogastric tube partially visualized in stomach. Electronically Signed   By: Myles Rosenthal.D.  On: 02/27/2022 08:07   DG Abd Portable 1V  Result Date: 02/26/2022 CLINICAL DATA:  NG tube placement EXAM: PORTABLE ABDOMEN - 1 VIEW COMPARISON:  Radiograph 02/25/2022 FINDINGS: NG tube with tip in the  gastric fundus. Side port below the GE junction. Gas-filled loops of colon and small bowel noted. RIGHT basilar atelectasis. IMPRESSION: NG tube in gastric fundus Electronically Signed   By: Suzy Bouchard M.D.   On: 02/26/2022 16:17      LOS: 5 days    Cordelia Poche, MD [facility] 02/28/2022, 11:35 AM   If 7PM-7AM, please contact night-coverage [email or web address]

## 2022-02-28 NOTE — TOC Progression Note (Signed)
Transition of Care [facility]) - Progression Note    Patient Details  Name: Steven Ward MRN: [id number] Date of Birth: October 19, 1924  Transition of [facility]) CM/SW Contact  Zenon Mayo, RN Phone Number: 02/28/2022, 4:52 PM  Clinical Narrative:    SBO, NG tube to suction, for picc today to start TPN. IV ABX.  TOC will continue to follow for dc needs.         Expected Discharge Plan and Services                                                 Social Determinants of Health (SDOH) Interventions    Readmission Risk Interventions     No data to display

## 2022-02-28 NOTE — Progress Notes (Signed)
Nutrition Follow-up  DOCUMENTATION CODES:   Not applicable  INTERVENTION:   TPN per Pharmacy.   NUTRITION DIAGNOSIS:   Inadequate oral intake related to inability to eat as evidenced by NPO status; ongoing  GOAL:   Patient will meet greater than or equal to 90% of their needs; to be met with TPN  MONITOR:   Diet advancement, Labs, Weight trends, Skin, I & O's  REASON FOR ASSESSMENT:   Consult Assessment of nutrition requirement/status  ASSESSMENT:   88 y.o. male with medical history significant of paroxysmal A-fib not on anticoagulation, hypertension, hypothyroidism, Parkinson's disease, lumbar spondylosis, type 2 diabetes, hyperlipidemia, PVD, CKD stage IIIb, remote history of colon cancer, BPH, pancreatic insufficiency, gallstones, GERD presents with SBO.  Pt unable to tolerate clear liquids. Pt with continued nausea when NG tube clamped. NGT continues in place to suction. NGT output: 300 ml x 24 hours. Pt no nutrition since admission and unable to tolerate POs. Pt with partial SBO. Plans for TPN initiation today. PICC placed today. Per Pharmacy, plans to start TPN at 35 ml/hr today. Goal TPN to be at rate of 70 ml/hr to provide 1700 kcal and 75 grams of protein.   Labs and medications reviewed.   Diet Order:   Diet Order             Diet NPO time specified Except for: Ice Chips, Other (See Comments)  Diet effective now                   EDUCATION NEEDS:   Not appropriate for education at this time  Skin:  Skin Assessment: Skin Integrity Issues: Skin Integrity Issues:: Unstageable Stage II: perineum Unstageable: coccyx  Last BM:  6/13  Height:   Ht Readings from Last 1 Encounters:  02/22/22 $RemoveB'5\' 8"'[id number]$  (1.727 m)    Weight:   Wt Readings from Last 1 Encounters:  02/27/22 75.4 kg   BMI:  Body mass index is 25.27 kg/m.  Estimated Nutritional Needs:   Kcal:  1700-1850  Protein:  75-85 grams  Fluid:  >/= 1.7 L/day  Corrin Parker, MS, RD,  LDN RD pager number/after hours weekend pager number on Amion.

## 2022-02-28 NOTE — Progress Notes (Signed)
Peripherally Inserted Central Catheter Placement  The IV Nurse has discussed with the patient and/or persons authorized to consent for the patient, the purpose of this procedure and the potential benefits and risks involved with this procedure.  The benefits include less needle sticks, lab draws from the catheter, and the patient may be discharged home with the catheter. Risks include, but not limited to, infection, bleeding, blood clot (thrombus formation), and puncture of an artery; nerve damage and irregular heartbeat and possibility to perform a PICC exchange if needed/ordered by physician.  Alternatives to this procedure were also discussed.  Bard Power PICC patient education guide, fact sheet on infection prevention and patient information card has been provided to patient /or left at bedside.    PICC Placement Documentation  PICC Double Lumen 38/38/18 Right Basilic 40 cm 0 cm (Active)  Indication for Insertion or Continuance of Line Administration of hyperosmolar/irritating solutions (i.e. TPN, Vancomycin, etc.) 02/28/22 1400  Exposed Catheter (cm) 0 cm 02/28/22 1400  Site Assessment Clean, Dry, Intact 02/28/22 1400  Lumen #1 Status Flushed;Saline locked;Blood return noted 02/28/22 1400  Lumen #2 Status Flushed;Saline locked;Blood return noted 02/28/22 1400  Dressing Type Transparent;Securing device 02/28/22 1400  Dressing Status Antimicrobial disc in place 02/28/22 1400  Safety Lock Not Applicable 40/37/54 3606  Line Care Connections checked and tightened 02/28/22 1400  Dressing Intervention New dressing 02/28/22 1400  Dressing Change Due 03/07/22 02/28/22 1400       Holley Bouche Renee 02/28/2022, 2:20 PM

## 2022-02-28 NOTE — Progress Notes (Signed)
Mobility Specialist Progress Note:   02/28/22 1723  Mobility  Activity Transferred from chair to bed  Level of Assistance Minimal assist, patient does 75% or more  Assistive Device  (HHA)  Distance Ambulated (ft) 2 ft  Activity Response Tolerated well  $Mobility charge 1 Mobility   Pt received in chair asking to go back to bed. No complaints of pain. MinA to stand and step to bed. Left in bed with call bell in reach and all needs met.   [facility] Alexei Doswell Mobility Specialist

## 2022-02-28 NOTE — Progress Notes (Signed)
PHARMACY - TOTAL PARENTERAL NUTRITION CONSULT NOTE   Indication: Small bowel obstruction  Patient Measurements: Height: '5\' 8"'$  (172.7 cm) Weight: 75.4 kg (166 lb 3.6 oz) IBW/kg (Calculated) : 68.4 TPN AdjBW (KG): 65.3 Body mass index is 25.27 kg/m.   Assessment: 88 y.o. male with medical history significant of paroxysmal A-fib not on anticoagulation, hypertension, hypothyroidism, Parkinson's disease, lumbar spondylosis, type 2 diabetes, hyperlipidemia, PVD, CKD stage IIIb, remote history of colon cancer, BPH, pancreatic insufficiency, gallstones, GERD presents with partial SBO. Managing conservatively for now. Pharmacy consulted to initiate TPN.  Glucose / Insulin: Glucose well controlled, no SSI yesterday Electrolytes: WNL Renal: Scr 1.96 (CrCl 22 ml/min) Hepatic: WNL on 6/7 Intake / Output; MIVF: UOP 0.2 ml/kg/hr. 300 mls of NGT output  GI Imaging: 6/12 Ileus pattern without significant change GI Surgeries / Procedures:   Central access:  TPN start date:   Nutritional Goals: Goal TPN rate is 70 mL/hr (provides 75 g of protein and 1700 kcals per day)  RD Assessment: Estimated Needs Total Energy Estimated Needs: 1700-1850 Total Protein Estimated Needs: 75-85 grams Total Fluid Estimated Needs: >/= 1.7 L/day  Current Nutrition:  NPO  Plan:  Start TPN at 35 mL/hr at 1800 Electrolytes in TPN: Na 51mq/L, K 254m/L, Ca 26m73mL, Mg 3mE20m, and Phos 7mmo38m. Cl:Ac 1:1 Add standard MVI and trace elements to TPN Initiate Sensitive q4h SSI and adjust as needed  Reduce MIVF to 35 mL/hr at 1800 Monitor TPN labs on Mon/Thurs, and tomorrow  CathyAlanda SlimrmD, FCCM [facility] LLCical Pharmacist Please see AMION for all Pharmacists' Contact Phone Numbers 02/28/2022, 10:15 AM

## 2022-03-01 DIAGNOSIS — J189 Pneumonia, unspecified organism: Secondary | ICD-10-CM

## 2022-03-01 DIAGNOSIS — R918 Other nonspecific abnormal finding of lung field: Secondary | ICD-10-CM

## 2022-03-01 DIAGNOSIS — E039 Hypothyroidism, unspecified: Secondary | ICD-10-CM

## 2022-03-01 DIAGNOSIS — E872 Acidosis, unspecified: Secondary | ICD-10-CM

## 2022-03-01 DIAGNOSIS — S22000A Wedge compression fracture of unspecified thoracic vertebra, initial encounter for closed fracture: Secondary | ICD-10-CM

## 2022-03-01 DIAGNOSIS — I480 Paroxysmal atrial fibrillation: Secondary | ICD-10-CM | POA: Diagnosis not present

## 2022-03-01 DIAGNOSIS — J929 Pleural plaque without asbestos: Secondary | ICD-10-CM

## 2022-03-01 DIAGNOSIS — N1832 Chronic kidney disease, stage 3b: Secondary | ICD-10-CM

## 2022-03-01 DIAGNOSIS — J8410 Pulmonary fibrosis, unspecified: Secondary | ICD-10-CM

## 2022-03-01 DIAGNOSIS — K56609 Unspecified intestinal obstruction, unspecified as to partial versus complete obstruction: Secondary | ICD-10-CM

## 2022-03-01 DIAGNOSIS — I639 Cerebral infarction, unspecified: Secondary | ICD-10-CM

## 2022-03-01 DIAGNOSIS — E876 Hypokalemia: Secondary | ICD-10-CM

## 2022-03-01 LAB — COMPREHENSIVE METABOLIC PANEL
ALT: 5 U/L (ref 0–44)
AST: 16 U/L (ref 15–41)
Albumin: 1.8 g/dL — ABNORMAL LOW (ref 3.5–5.0)
Alkaline Phosphatase: 45 U/L (ref 38–126)
Anion gap: 7 (ref 5–15)
BUN: 45 mg/dL — ABNORMAL HIGH (ref 8–23)
CO2: 19 mmol/L — ABNORMAL LOW (ref 22–32)
Calcium: 7.7 mg/dL — ABNORMAL LOW (ref 8.9–10.3)
Chloride: 111 mmol/L (ref 98–111)
Creatinine, Ser: 1.87 mg/dL — ABNORMAL HIGH (ref 0.61–1.24)
GFR, Estimated: 33 mL/min — ABNORMAL LOW (ref >60)
Glucose, Bld: 185 mg/dL — ABNORMAL HIGH (ref 70–99)
Potassium: 3.5 mmol/L (ref 3.5–5.1)
Sodium: 137 mmol/L (ref 135–145)
Total Bilirubin: 0.8 mg/dL (ref 0.3–1.2)
Total Protein: 4.6 g/dL — ABNORMAL LOW (ref 6.5–8.1)

## 2022-03-01 LAB — PHOSPHORUS: Phosphorus: 2.5 mg/dL (ref 2.5–4.6)

## 2022-03-01 LAB — CBC
HCT: 29.9 % — ABNORMAL LOW (ref 39.0–52.0)
Hemoglobin: 10.2 g/dL — ABNORMAL LOW (ref 13.0–17.0)
MCH: 33.3 pg (ref 26.0–34.0)
MCHC: 34.1 g/dL (ref 30.0–36.0)
MCV: 97.7 fL (ref 80.0–100.0)
Platelets: 109 10*3/uL — ABNORMAL LOW (ref 150–400)
RBC: 3.06 MIL/uL — ABNORMAL LOW (ref 4.22–5.81)
RDW: 14.6 % (ref 11.5–15.5)
WBC: 10.4 10*3/uL (ref 4.0–10.5)
nRBC: 0 % (ref 0.0–0.2)

## 2022-03-01 LAB — TRIGLYCERIDES: Triglycerides: 85 mg/dL (ref <150)

## 2022-03-01 LAB — GLUCOSE, CAPILLARY
Glucose-Capillary: 167 mg/dL — ABNORMAL HIGH (ref 70–99)
Glucose-Capillary: 178 mg/dL — ABNORMAL HIGH (ref 70–99)
Glucose-Capillary: 179 mg/dL — ABNORMAL HIGH (ref 70–99)
Glucose-Capillary: 193 mg/dL — ABNORMAL HIGH (ref 70–99)
Glucose-Capillary: 194 mg/dL — ABNORMAL HIGH (ref 70–99)
Glucose-Capillary: 203 mg/dL — ABNORMAL HIGH (ref 70–99)
Glucose-Capillary: 207 mg/dL — ABNORMAL HIGH (ref 70–99)

## 2022-03-01 LAB — MAGNESIUM: Magnesium: 1.9 mg/dL (ref 1.7–2.4)

## 2022-03-01 MED ORDER — POTASSIUM CHLORIDE 10 MEQ/100ML IV SOLN
10.0000 meq | INTRAVENOUS | Status: AC
  Administered 2022-03-01 (×6): 10 meq via INTRAVENOUS
  Filled 2022-03-01 (×6): qty 100

## 2022-03-01 MED ORDER — TRAVASOL 10 % IV SOLN
INTRAVENOUS | Status: AC
  Filled 2022-03-01: qty 756

## 2022-03-01 MED ORDER — MAGNESIUM SULFATE 2 GM/50ML IV SOLN
2.0000 g | Freq: Once | INTRAVENOUS | Status: AC
  Administered 2022-03-01: 2 g via INTRAVENOUS
  Filled 2022-03-01: qty 50

## 2022-03-01 MED ORDER — ENOXAPARIN SODIUM 30 MG/0.3ML IJ SOSY
30.0000 mg | PREFILLED_SYRINGE | INTRAMUSCULAR | Status: DC
  Administered 2022-03-01 – 2022-03-02 (×2): 30 mg via SUBCUTANEOUS
  Filled 2022-03-01 (×2): qty 0.3

## 2022-03-01 NOTE — Assessment & Plan Note (Signed)
synthroid °

## 2022-03-01 NOTE — Assessment & Plan Note (Signed)
Follow with pulm outpatient

## 2022-03-01 NOTE — Hospital Course (Addendum)
Steven Ward is [name] 88 y.o. male with medical history significant of paroxysmal [place]-fib not on anticoagulation, hypertension, hypothyroidism, Parkinson's disease, lumbar spondylosis, type 2 diabetes, hyperlipidemia, PVD, CKD stage IIIb, remote history of colon cancer, BPH, pancreatic insufficiency, gallstones, GERD. Patient presented secondary to generalized weakness and found to have evidence of atrial fibrillation, SBO and acute infarct. General surgery consulted; NPO with NG tube placed. Cardiology consulted and patient's rate managed with beta blocker. Neurology consulted for stroke. SBO persistent and likely partial. PICC ordered for TPN starting 6/13.  Now tolerating regular diet and general surgery has signed off.  Currently awaiting CIR.  See below for additional details

## 2022-03-01 NOTE — Assessment & Plan Note (Signed)
sinemet

## 2022-03-01 NOTE — Assessment & Plan Note (Signed)
CIR recommended by therapy

## 2022-03-01 NOTE — Assessment & Plan Note (Addendum)
KUB 6/13 with contrast throughout colon Improvement in small bowel gas pattern, few persistent gas filled dilated loops NGT removed, regular diet, ADAT per surgery  Continue PICC/TNA for now -> d/c TPN today Appreciate assistance

## 2022-03-01 NOTE — Assessment & Plan Note (Addendum)
flomax

## 2022-03-01 NOTE — Assessment & Plan Note (Signed)
On thiazide and loop outpatient - hold Continue metoprolol (started inpatient)

## 2022-03-01 NOTE — Assessment & Plan Note (Addendum)
Rate controlled. Cardiology consulted. Transthoracic Echocardiogram ordered and significant for severely dilated left atrium with normal LVEF. Anticoagulation deferred secondary to acute infarct, per cardiology. Started on metoprolol PO -Cardiology recommendations: Metoprolol 12.5 mg BID -eliquis

## 2022-03-01 NOTE — Assessment & Plan Note (Addendum)
SSI A1c 7.9 Insulin per pharmacy

## 2022-03-01 NOTE — Progress Notes (Signed)
Occupational Therapy Treatment Patient Details Name: Steven Ward MRN: [id number] DOB: Apr 04, 1925 Today's Date: 03/01/2022   History of present illness The pt is a 88 yo male presenting 6/7 with weakness, fatigue, poor appetite, and fall while walking to the bathroom on day of admission. Pt found to be in afib with frequent PVCs (new after ablation 20 years ago). CT revealed mid/distal SBO, NG tube placed 6/7. MRI revealed R cerebellar infarct. PMH includes: DM II, HLD, PVD, PD, PAF, CKD III, colon cancer, and BPH.   OT comments  Patient agreeable to EOB sitting and light grooming task, deferred out of bed to the recliner.  Patient stating that it was a little to early, and he wanted to wait until this afternoon.  Patient able to perform light HEP to upper body seated with verbal cues and demonstration. Patient continues to be motivated to improve, with AIR recommended fr post acute rehab prior to returning home.  OT to check back in the afternoon as schedule allows for out of bed.  Continue efforts in the acute setting.      Recommendations for follow up therapy are one component of a multi-disciplinary discharge planning process, led by the attending physician.  Recommendations may be updated based on patient status, additional functional criteria and insurance authorization.    Follow Up Recommendations  Acute inpatient rehab (3hours/day)    Assistance Recommended at Discharge Frequent or constant Supervision/Assistance  Patient can return home with the following  A lot of help with bathing/dressing/bathroom;Assistance with cooking/housework;Help with stairs or ramp for entrance;Assist for transportation;Direct supervision/assist for financial management;Direct supervision/assist for medications management;A lot of help with walking and/or transfers   Equipment Recommendations  BSC/3in1    Recommendations for Other Services      Precautions / Restrictions Precautions Precautions:  Fall Precaution Comments: NG tube Restrictions Weight Bearing Restrictions: No       Mobility Bed Mobility Overal bed mobility: Needs Assistance Bed Mobility: Supine to Sit, Sit to Supine     Supine to sit: Mod assist Sit to supine: Mod assist        Transfers                   General transfer comment: declines OOB at this time     Balance Overall balance assessment: Needs assistance Sitting-balance support: Bilateral upper extremity supported, Feet unsupported Sitting balance-Leahy Scale: Fair                                     ADL either performed or assessed with clinical judgement   ADL       Grooming: Wash/dry hands;Wash/dry face;Set up;Sitting               Lower Body Dressing: Maximal assistance;Sitting/lateral leans                        Cognition Arousal/Alertness: Awake/alert Behavior During Therapy: Flat affect Overall Cognitive Status: No family/caregiver present to determine baseline cognitive functioning                               Problem Solving: Requires verbal cues                             Pertinent Vitals/ Pain  Pain Assessment Pain Assessment: No/denies pain                                                          Frequency  Min 2X/week        Progress Toward Goals  OT Goals(current goals can now be found in the care plan section)  Progress towards OT goals: Progressing toward goals  Acute Rehab OT Goals OT Goal Formulation: With patient Time For Goal Achievement: 03/09/22 Potential to Achieve Goals: [place] Discharge plan remains appropriate    Co-evaluation                 AM-PAC OT "6 Clicks" Daily Activity     Outcome Measure   Help from another person eating meals?: Total Help from another person taking care of personal grooming?: A Little Help from another person toileting, which includes using toliet,  bedpan, or urinal?: A Lot Help from another person bathing (including washing, rinsing, drying)?: A Lot Help from another person to put on and taking off regular upper body clothing?: A Little Help from another person to put on and taking off regular lower body clothing?: A Lot 6 Click Score: 13    End of Session    OT Visit Diagnosis: Unsteadiness on feet (R26.81);Muscle weakness (generalized) (M62.81);Ataxia, unspecified (R27.0)   Activity Tolerance Patient limited by fatigue   Patient Left in bed;with call bell/phone within reach   Nurse Communication          Time: 0821-0840 OT Time Calculation (min): 19 min  Charges: OT General Charges $OT Visit: 1 Visit OT Treatments $Self Care/Home Management : 8-22 mins  03/01/2022  RP, OTR/L  Acute Rehabilitation Services  Office:  [phone]   Metta Clines 03/01/2022, 8:52 AM

## 2022-03-01 NOTE — Assessment & Plan Note (Addendum)
Acute punctate infarct noted on MRI located within the right cerebellar hemisphere in addition to possible subacute infarct of right frontal lobe. LDL of 34. Hemoglobin A1C of 7.7%. Transthoracic Echocardiogram with normal LVEF of 55-60% with no evidence of atrial level shunt. -Neurology recommendations: Aspirin 81 mg, Eliquis. Outpatient follow-up. Signed off. -PT/OT recommending CIR, pending at this time -SLP following

## 2022-03-01 NOTE — Progress Notes (Signed)
PT Cancellation Note  Patient Details Name: Steven Ward MRN: [id number] DOB: [birth year]   Cancelled Treatment:    Reason Eval/Treat Not Completed: Patient declined, no reason specified, pt declining all OOB/EOB mobility stating "its too late in the day", pt requesting bedpan, pt assisted with rolling and bedpan placed. Will check back as schedule allows to continue with PT POC.    Betsey Holiday Alton Bouknight 03/01/2022, 2:59 PM

## 2022-03-01 NOTE — Assessment & Plan Note (Signed)
Mild, follow °

## 2022-03-01 NOTE — Assessment & Plan Note (Signed)
pulm f/u outpatient

## 2022-03-01 NOTE — Progress Notes (Signed)
Patient ID: Steven Ward, male   DOB: 08-Jan-1925, 88 y.o.   MRN: [id number] [facility] Surgery Progress Note     Subjective: Continues to have BMs.  NGT output was documented at 450 yesterday.  [place] with not much of anything currently.  Denies pain.  Apparently unable to mobilize at baseline much.  Objective: Vital signs in last 24 hours: Temp:  [97.4 F (36.3 C)-98.1 F (36.7 C)] 98 F (36.7 C) (06/14 0400) Pulse Rate:  [60-105] 65 (06/14 0500) Resp:  [12-19] 17 (06/14 0500) BP: (116-137)/(52-72) 124/52 (06/14 0400) SpO2:  [96 %-99 %] 97 % (06/14 0500) Weight:  [75.4 kg] 75.4 kg (06/14 0456) Last BM Date : 03/01/22  Intake/Output from previous day: 06/13 0701 - 06/14 0700 In: 943.1 [P.O.:30; I.V.:913.1] Out: 1100 [Urine:650; Emesis/NG output:450] Intake/Output this shift: No intake/output data recorded.  PE: Gen:  Alert, NAD, pleasant Abd: soft, ND, +BS, NGT with minimal output currently, NT  Lab Results:  Recent Labs    02/27/22 0210 03/01/22 0420  WBC 10.6* 10.4  HGB 11.0* 10.2*  HCT 32.8* 29.9*  PLT 131* 109*   BMET Recent Labs    02/28/22 0250 03/01/22 0420  NA 140 137  K 3.6 3.5  CL 110 111  CO2 18* 19*  GLUCOSE 102* 185*  BUN 51* 45*  CREATININE 1.96* 1.87*  CALCIUM 8.0* 7.7*   PT/INR No results for input(s): "LABPROT", "INR" in the last 72 hours. CMP     Component Value Date/Time   NA 137 03/01/2022 0420   K 3.5 03/01/2022 0420   CL 111 03/01/2022 0420   CO2 19 (L) 03/01/2022 0420   GLUCOSE 185 (H) 03/01/2022 0420   BUN 45 (H) 03/01/2022 0420   CREATININE 1.87 (H) 03/01/2022 0420   CALCIUM 7.7 (L) 03/01/2022 0420   PROT 4.6 (L) 03/01/2022 0420   ALBUMIN 1.8 (L) 03/01/2022 0420   AST 16 03/01/2022 0420   ALT 5 03/01/2022 0420   ALKPHOS 45 03/01/2022 0420   BILITOT 0.8 03/01/2022 0420   GFRNONAA 33 (L) 03/01/2022 0420   GFRAA  04/26/2009 0920    >60        The eGFR has been calculated using the MDRD equation. This  calculation has not been validated in all clinical situations. eGFR's persistently <60 mL/min signify possible Chronic Kidney Disease.   Lipase  No results found for: "LIPASE"     Studies/Results: DG Chest Port 1 View  Result Date: 02/28/2022 CLINICAL DATA:  PICC placement EXAM: PORTABLE CHEST 1 VIEW COMPARISON:  02/23/2022 FINDINGS: Mild cardiomegaly. Right upper extremity PICC, tip positioned near the superior cavoatrial junction. Esophagogastric tube with tip and side port below the diaphragm. Trace bilateral pleural effusions. Calcified bilateral pulmonary nodules and pleural plaques. IMPRESSION: 1. Right upper extremity PICC, tip positioned near the superior cavoatrial junction. 2. Trace bilateral pleural effusions. 3. Esophagogastric tube with tip and side port below the diaphragm. Electronically Signed   By: Delanna Ahmadi M.D.   On: 02/28/2022 14:46   [place] EKG SITE RITE  Result Date: 02/28/2022 If Site Rite image not attached, placement could not be confirmed due to current cardiac rhythm.  DG Abd Portable 1V  Result Date: 02/28/2022 CLINICAL DATA:  Follow-up small bowel obstruction EXAM: PORTABLE ABDOMEN - 1 VIEW COMPARISON:  02/27/2022.  02/26/2022.  02/25/2022 FINDINGS: Previously seen contrast present throughout the colon. Few dilated loops of small bowel main evident, but the trend remains favorable. Maximal dimension is 5 cm in the left  central abdomen. Nasogastric tube tip in the stomach. IMPRESSION: Previously administered contrast present throughout the colon, making slow progression. Continued improvement of the small bowel gas pattern, but with a few persistent gas-filled dilated loops. Electronically Signed   By: Nelson Chimes M.D.   On: 02/28/2022 08:10    Anti-infectives: Anti-infectives (From admission, onward)    Start     Dose/Rate Route Frequency Ordered Stop   02/23/22 0000  cefTRIAXone (ROCEPHIN) 2 g in sodium chloride 0.9 % 100 mL IVPB  Status:  Discontinued         2 g 200 mL/hr over 30 Minutes Intravenous Daily at 10 pm 02/22/22 2346 02/28/22 1142   02/23/22 0000  metroNIDAZOLE (FLAGYL) IVPB 500 mg  Status:  Discontinued        500 mg 100 mL/hr over 60 Minutes Intravenous 2 times daily 02/22/22 2346 02/23/22 1802        Assessment/Plan Partial SBO -repeat protocol with contrast throughout the colon, but still with some dilated loops of small bowel and NGT output.  Query a component of bowel motility vs partial obstructive picture.  He is clearly not completely obstructed -still having bowel function -low dose reglan added  -will try to clamp NGT today and see how he does.  Suspect this situation is more of a mobility issue with his gut as opposed to obstruction type symptoms. -cont PICC/TNA while unable to eat    FEN - NGT, clamping trial/NPO, IVF/TNA VTE - hold eliquis, ok for heparin gtt or chemical prophylaxis from our standpoint ID - rocephin 6/8>> for CAP   New CVA this admit DM HTN Pancreatic insufficiency A fib on eliquis  Parkinson's disease Hypothyroidism BPH Pulmonary fibrosis/RLL mass   I reviewed hospitalist notes, last 24 h vitals and pain scores, last 48 h intake and output, last 24 h labs and trends, and last 24 h imaging results.     LOS: 6 days    [name], [facility] Surgery 03/01/2022, 8:51 AM Please see Amion for pager number during day hours 7:00am-4:30pm

## 2022-03-01 NOTE — Progress Notes (Signed)
Mobility Specialist Progress Note:   03/01/22 1157  Mobility  Activity Transferred from bed to chair  Level of Assistance Moderate assist, patient does 50-74% (+2)  Assistive Device  (HHA)  Distance Ambulated (ft) 2 ft  Activity Response Tolerated well  $Mobility charge 1 Mobility   Pt received in bed willing to participate in mobility. No complaints of pain. ModA to stand and stp to chair. Left in chair with call bell in reach and all needs met.   [facility] Janie Strothman Mobility Specialist

## 2022-03-01 NOTE — Progress Notes (Signed)
Inpatient Rehab Admissions Coordinator:   Note initiation of TPN yesterday.  NGT output down.  Will continue to follow for possible CIR pending medical progress and timing of opening insurance auth.   Shann Medal, PT, DPT Admissions Coordinator [phone] 03/01/22  10:33 AM

## 2022-03-01 NOTE — Assessment & Plan Note (Signed)
PCP and/or pulm follow up outpatient Needs repeat imaging within 3 months

## 2022-03-01 NOTE — Assessment & Plan Note (Signed)
Will follow, supplement as needed Goal >4

## 2022-03-01 NOTE — TOC Initial Note (Addendum)
Transition of Care [facility]) - Initial/Assessment Note    Patient Details  Name: Steven Ward MRN: [id number] Date of Birth: 08-01-1925  Transition of Care [facility] CM/SW Contact:    Zenon Mayo, RN Phone Number: 03/01/2022, 3:02 PM  Clinical Narrative:                 He is from home with son, he is here with SBO, has NG tube to suction, GI to clamp tube today to see if tolerate,  for picc placement to start TPN.  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  He has walker, cane and recliner at home.  TOC will continue to follow for dc needs. CIR following for IP rehab.  Expected Discharge Plan: IP Rehab Facility Barriers to Discharge: Continued Medical Work up   Patient Goals and CMS Choice Patient states their goals for this hospitalization and ongoing recovery are:: CIR      Expected Discharge Plan and Services Expected Discharge Plan: [place]   Discharge Planning Services: CM Consult   Living arrangements for the past 2 months: Single Family Home                   DME Agency: NA       HH Arranged: NA          Prior Living Arrangements/Services Living arrangements for the past 2 months: Single Family Home Lives with:: Adult Children Patient language and need for interpreter reviewed:: Yes Do you feel safe going back to the place where you live?: Yes      Need for Family Participation in Patient Care: Yes (Comment) Care giver support system in place?: Yes (comment) Current home services: DME (walker , cane and recliner) Criminal Activity/Legal Involvement Pertinent to Current Situation/Hospitalization: No - Comment as needed  Activities of Daily Living Home Assistive Devices/Equipment: Cane (specify quad or straight) ADL Screening (condition at time of admission) Patient's cognitive ability adequate to safely complete daily activities?: Yes Is the patient deaf or have difficulty hearing?: No Does the patient have difficulty seeing, even when wearing  glasses/contacts?: No Does the patient have difficulty concentrating, remembering, or making decisions?: No Patient able to express need for assistance with ADLs?: Yes Does the patient have difficulty dressing or bathing?: Yes Independently performs ADLs?: No Communication: Independent Dressing (OT): Appropriate for developmental age Grooming: Appropriate for developmental age Feeding: Independent Bathing: Appropriate for developmental age 35: Appropriate for developmental age In/Out Bed: Independent with device (comment), Needs assistance Is this a change from baseline?: Pre-admission baseline Walks in Home: Independent with device (comment) Does the patient have difficulty walking or climbing stairs?: Yes Weakness of Legs: Both Weakness of Arms/Hands: Both  Permission Sought/Granted                  Emotional Assessment Appearance:: Appears stated age Attitude/Demeanor/Rapport: Engaged Affect (typically observed): Appropriate Orientation: : Oriented to  Time, Oriented to Situation, Oriented to Place, Oriented to Self Alcohol / Substance Use: Not Applicable Psych Involvement: No (comment)  Admission diagnosis:  Paroxysmal atrial fibrillation ([place]) [I48.0] Weakness [R53.1] Patient Active Problem List   Diagnosis Date Noted   Pressure injury of skin 02/26/2022   Paroxysmal atrial fibrillation ([place]) 02/22/2022   Generalized weakness 02/22/2022   Nausea & vomiting 02/22/2022   Acute kidney injury superimposed on chronic kidney disease ([place]) 02/22/2022   Anemia 07/06/2021   Atrial flutter ([place]) 07/06/2021   Disease of pancreas 07/06/2021   Encounter for immunization 07/06/2021  Enlarged prostate 07/06/2021   Hyperlipidemia 07/06/2021   Malignant neoplasm of colon ([place]) 07/06/2021   Other B-complex deficiencies 07/06/2021   Peripheral vascular disease ([place]) 07/06/2021   Pure hypercholesterolemia 07/06/2021   At moderate risk for fall 08/05/2019   PD  (Parkinson's disease) ([place]) 01/15/2018   Dyslipidemia 04/08/2017   Major depressive episode 04/19/2016   CKD stage 3 due to type 2 diabetes mellitus ([place]) 08/25/2015   Spinal stenosis of lumbar region 05/25/2015   BPH (benign prostatic hyperplasia) 11/10/2013   Bilateral lumbar radiculopathy 11/10/2013   Obesity (BMI 30-39.9) 04/21/2013   Dyspnea 06/23/2011   PAF (paroxysmal atrial fibrillation) ([place]) 06/23/2011   SKIN RASH 01/17/2010   ABNORMAL THYROID FUNCTION TESTS 01/17/2010   EDEMA LEG 05/04/2009   GALLSTONES 04/07/2009   ABDOMINAL PAIN RIGHT UPPER QUADRANT 04/07/2009   DIARRHEA 02/26/2009   Type 2 diabetes mellitus, controlled ([place]) 12/01/2008   Essential hypertension 12/01/2008   ALLERGIC RHINITIS 12/01/2008   GERD 12/01/2008   SPONDYLOSIS, LUMBAR 12/01/2008   COLONIC POLYPS, HX OF 12/01/2008   PCP:  [name], MD Pharmacy:   [id number] [place], [place] - [address] [place] [address]Phone: [phone]: [phone]    Social Determinants of Health (SDOH) Interventions    Readmission Risk Interventions    03/01/2022    2:59 PM  Readmission Risk Prevention Plan  Transportation Screening Complete  PCP or Specialist Appt within 3-5 Days Complete  [place] or [place]  Social Work Consult for [place]seling Complete  Palliative Care Screening Not Applicable  Medication Review (Press photographer Complete

## 2022-03-01 NOTE — Assessment & Plan Note (Addendum)
T5 and T12, noted, follow Needs outpatient follow up

## 2022-03-01 NOTE — Assessment & Plan Note (Addendum)
Pressure Injury 02/25/22 Coccyx Unstageable - Full thickness tissue loss in which the base of the injury is covered by slough (yellow, tan, gray, green or brown) and/or eschar (tan, brown or black) in the wound bed. (Active)  02/25/22 2100  Location: Coccyx  Location Orientation:   Staging: Unstageable - Full thickness tissue loss in which the base of the injury is covered by slough (yellow, tan, gray, green or brown) and/or eschar (tan, brown or black) in the wound bed.  Wound Description (Comments):   Present on Admission: Yes

## 2022-03-01 NOTE — Assessment & Plan Note (Signed)
S/p treatment with ceftriaxone/flagyl for pneumonia

## 2022-03-01 NOTE — Assessment & Plan Note (Signed)
Goal >2 Follow, supplement prn

## 2022-03-01 NOTE — Progress Notes (Addendum)
PHARMACY - TOTAL PARENTERAL NUTRITION CONSULT NOTE   Indication: Small bowel obstruction  Patient Measurements: Height: '5\' 8"'$  (172.7 cm) Weight: 75.4 kg (166 lb 3.6 oz) IBW/kg (Calculated) : 68.4 TPN AdjBW (KG): 65.3 Body mass index is 25.27 kg/m.   Assessment: 88 y.o. male with medical history significant of paroxysmal A-fib not on anticoagulation, hypertension, hypothyroidism, Parkinson's disease, lumbar spondylosis, type 2 diabetes, hyperlipidemia, PVD, CKD stage IIIb, remote history of colon cancer, BPH, pancreatic insufficiency, gallstones, GERD presents with partial SBO. Managing conservatively for now. Pharmacy consulted to initiate TPN.  Glucose / Insulin: BG 84-185, 5 units given over past 24 hrs Electrolytes: K 3.5 (goal >4), Mag 1.9 (goal >2), coCa 9.5, all others WNL Renal: Scr 1.87, BUN 45 Hepatic: WNL, albumin 1.8 Intake / Output; MIVF: UOP 0.4 ml/kg/hr. NGT output: 450 mls, LBM 6/14  GI Imaging: 6/12 Ileus pattern without significant change 6/13 Previously administered contrast present throughout the colon, making slow progression. Continued improvement of the small bowel gas pattern, but with a few persistent gas-filled dilated loops. GI Surgeries / Procedures:  None to date  Central access: PICC placed 6/13 TPN start date: 6/13  Nutritional Goals: Goal TPN rate is 70 mL/hr (provides 75 g of protein and 1700 kcals per day)  RD Assessment: Estimated Needs Total Energy Estimated Needs: 1700-1850 Total Protein Estimated Needs: 75-85 grams Total Fluid Estimated Needs: >/= 1.7 L/day  Current Nutrition:  NPO  Plan:  Increase TPN to goal rate of 70 mL/hr at 1800 Electrolytes in TPN: Continue: K 9mq/L, Ca 524m/L, Mg 45m36mL, and Phos 7mm68mL. Increase: Na to 55mE68mand Cl:Ac to 1:2. Add standard MVI and trace elements to TPN Continue Sensitive q4h SSI and adjust as needed  Discontinue MIVF at 1800 Monitor TPN labs daily until stable on TPN and on  Mon/Thurs Give KCl IV 10mEq98m and Mag sulfate 2g IV x1 outside of TPN F/u patient tolerance of NGT clamping trial on 6/14  Eldo Umanzor Luisa HartmD, BCPS Clinical Pharmacist 03/01/2022 8:12 AM   Please refer to AMION for pharmacy phone number

## 2022-03-01 NOTE — Progress Notes (Signed)
PROGRESS NOTE    Steven Ward  HYQ:[id number] DOB: Sep 12, 1925 DOA: 02/22/2022 PCP: [name], MD  Chief Complaint  Patient presents with   Weakness   Fatigue    Brief Narrative:  Steven Ward is Steven Ward 88 y.o. male with medical history significant of paroxysmal Steven Ward-fib not on anticoagulation, hypertension, hypothyroidism, Parkinson's disease, lumbar spondylosis, type 2 diabetes, hyperlipidemia, PVD, CKD stage IIIb, remote history of colon cancer, BPH, pancreatic insufficiency, gallstones, GERD. Patient presented secondary to generalized weakness and found to have evidence of atrial fibrillation, SBO and acute infarct. General surgery consulted; NPO with NG tube placed. Cardiology consulted and patient's rate managed with beta blocker. Neurology consulted for stroke. SBO persistent and likely partial. PICC ordered for TPN starting 6/13.    Assessment & Plan:   Principal Problem:   Paroxysmal atrial fibrillation (HCC) Active Problems:   SBO (small bowel obstruction) (HCC)   Hypokalemia   Hypomagnesemia   Acute kidney injury superimposed on chronic kidney disease (HCC)   Metabolic acidosis   Acute CVA (cerebrovascular accident) ([place])   Generalized weakness   Essential hypertension   Pneumonia   T2DM (type 2 diabetes mellitus) ([place])   PD (Parkinson's disease) ([place])   Hypothyroidism   BPH (benign prostatic hyperplasia)   Decubitus ulcer   Compression fracture of body of thoracic vertebra (HCC)   Pleural plaque   Pulmonary fibrosis (HCC)   Right lower lobe lung mass   CKD stage 3 due to type 2 diabetes mellitus (HCC)   Nausea & vomiting   Stage 3b chronic kidney disease (CKD) (HCC)   Assessment and Plan: * Paroxysmal atrial fibrillation ([place]) Rate controlled. Cardiology consulted. Transthoracic Echocardiogram ordered and significant for severely dilated left atrium with normal LVEF. Anticoagulation deferred secondary to acute infarct, per cardiology. Started on  metoprolol PO -Cardiology recommendations: Metoprolol 12.5 mg BID -Start Eliquis once okay per general surgery (with clamping trial, hopefully can start PO soon, will hold off gtt or therapeutic lovenox for now)    SBO (small bowel obstruction) ([place]) KUB 6/13 with contrast throughout colon Improvement in small bowel gas pattern, few persistent gas filled dilated loops Surgery planning to clamp NGT today, added low dose reglan (more of motility issue?) Continue PICC/TNA for now Appreciate assistance  Hypokalemia Will follow, supplement as needed Goal >4   Hypomagnesemia Goal >2 Follow, supplement prn  Acute kidney injury superimposed on chronic kidney disease ([place]) Baseline appears to be around 2.2? Improved with IVF Peaked around 2.4ish Currently lower than baseline, trend  Metabolic acidosis Mild, follow  Acute CVA (cerebrovascular accident) ([place]) Acute punctate infarct noted on MRI located within the right cerebellar hemisphere in addition to possible subacute infarct of right frontal lobe. LDL of 34. Hemoglobin A1C of 7.7%. Transthoracic Echocardiogram with normal LVEF of 55-60% with no evidence of atrial level shunt. -Neurology recommendations: Aspirin 81 mg, Eliquis. Outpatient follow-up. Signed off. -PT/OT recommending CIR -SLP recommendations to be determind (complicated by SBO)    Generalized weakness CIR recommended by therapy  Essential hypertension On thiazide and loop outpatient - hold Continue metoprolol (started inpatient)  Pneumonia S/p treatment with ceftriaxone/flagyl for pneumonia  T2DM (type 2 diabetes mellitus) ([place]) SSI  PD (Parkinson's disease) ([place]) sinemet  Hypothyroidism synthroid  BPH (benign prostatic hyperplasia) flomax  Right lower lobe lung mass PCP and/or pulm follow up outpatient Needs repeat imaging within 3 months  Pulmonary fibrosis ([place]) pulm f/u outpatient  Pleural plaque Follow with pulm outpatient  Compression  fracture of body  of thoracic vertebra ([place]) T5 and T12, noted, follow  Decubitus ulcer Pressure Injury 02/25/22 Coccyx Unstageable - Full thickness tissue loss in which the base of the injury is covered by slough (yellow, tan, gray, green or brown) and/or eschar (tan, brown or black) in the wound bed. (Active)  02/25/22 2100  Location: Coccyx  Location Orientation:   Staging: Unstageable - Full thickness tissue loss in which the base of the injury is covered by slough (yellow, tan, gray, green or brown) and/or eschar (tan, brown or black) in the wound bed.  Wound Description (Comments):   Present on Admission: Yes          DVT prophylaxis: lovenox (hopefully can start eliquis if does well with clamping trial) Code Status: dnr Family Communication: none Disposition:   Status is: Inpatient Remains inpatient appropriate because: pending further improvement   Consultants:  Surgery Cards neuro  Procedures:  Carotid US Summary:  Right Carotid: Velocities in the right ICA are consistent with Steven Ward 1-39%  stenosis.   Left Carotid: Velocities in the left ICA are consistent with Steven Ward 1-39%  stenosis.   Vertebrals:  Bilateral vertebral arteries demonstrate antegrade flow.  Subclavians: Normal flow hemodynamics were seen in bilateral subclavian               arteries.   Echo IMPRESSIONS     1. Left ventricular ejection fraction, by estimation, is 55 to 60%. The  left ventricle has normal function. The left ventricle has no regional  wall motion abnormalities. There is mild concentric left ventricular  hypertrophy. Left ventricular diastolic  function could not be evaluated.   2. Right ventricular systolic function is normal. The right ventricular  size is normal. There is normal pulmonary artery systolic pressure.   3. Left atrial size was severely dilated.   4. The mitral valve is normal in structure. Trivial mitral valve  regurgitation. No evidence of mitral stenosis.   5.  The aortic valve is calcified. There is mild calcification of the  aortic valve. There is mild thickening of the aortic valve. Aortic valve  regurgitation is not visualized. No aortic stenosis is present.   6. The inferior vena cava is normal in size with greater than 50%  respiratory variability, suggesting right atrial pressure of 3 mmHg.   Antimicrobials:  Anti-infectives (From admission, onward)    Start     Dose/Rate Route Frequency Ordered Stop   02/23/22 0000  cefTRIAXone (ROCEPHIN) 2 g in sodium chloride 0.9 % 100 mL IVPB  Status:  Discontinued        2 g 200 mL/hr over 30 Minutes Intravenous Daily at 10 pm 02/22/22 2346 02/28/22 1142   02/23/22 0000  metroNIDAZOLE (FLAGYL) IVPB 500 mg  Status:  Discontinued        500 mg 100 mL/hr over 60 Minutes Intravenous 2 times daily 02/22/22 2346 02/23/22 1802       Subjective: No complaints  Objective: Vitals:   03/01/22 0400 03/01/22 0456 03/01/22 0500 03/01/22 1038  BP: (!) 124/52   134/61  Pulse: 67  65 69  Resp: '17  17 18  '$ Temp: 98 F (36.7 C)   97.9 F (36.6 C)  TempSrc: Oral   Oral  SpO2: 96%  97% 98%  Weight:  75.4 kg    Height:        Intake/Output Summary (Last 24 hours) at 03/01/2022 1721 Last data filed at 03/01/2022 1700 Gross per 24 hour  Intake 1842.48 ml  Output 1300  ml  Net 542.48 ml   Filed Weights   02/26/22 0448 02/27/22 0407 03/01/22 0456  Weight: 75.4 kg 75.4 kg 75.4 kg    Examination:  General exam: Appears calm and comfortable  Respiratory system: unlabored Cardiovascular system: RRR Gastrointestinal system: Abdomen is nondistended, soft and nontender Central nervous system: Alert and oriented. No focal neurological deficits. Extremities: no LEE  Data Reviewed: I have personally reviewed following labs and imaging studies  CBC: Recent Labs  Lab 02/23/22 0305 02/27/22 0210 03/01/22 0420  WBC 4.6 10.6* 10.4  HGB 11.7* 11.0* 10.2*  HCT 35.1* 32.8* 29.9*  MCV 97.2 97.9 97.7  PLT  159 131* 109*    Basic Metabolic Panel: Recent Labs  Lab 02/22/22 2032 02/23/22 0305 02/24/22 1044 02/25/22 1648 02/26/22 1425 02/27/22 0210 02/28/22 0250 03/01/22 0420  NA  --    < > 145 142  --  140 140 137  K  --    < > 3.2* 2.8* 4.2 3.4* 3.6 3.5  CL  --    < > 110 109  --  115* 110 111  CO2  --    < > 20* 20*  --  18* 18* 19*  GLUCOSE  --    < > 124* 207*  --  130* 102* 185*  BUN  --    < > 102* 78*  --  56* 51* 45*  CREATININE  --    < > 2.79* 2.26*  --  1.99* 1.96* 1.87*  CALCIUM  --    < > 8.1* 7.8*  --  7.6* 8.0* 7.7*  MG 2.0  --   --   --  2.0 1.8 2.1 1.9  PHOS  --   --   --   --   --   --   --  2.5   < > = values in this interval not displayed.    GFR: Estimated Creatinine Clearance: 22.9 mL/min (Sultan Pargas) (by C-G formula based on SCr of 1.87 mg/dL (H)).  Liver Function Tests: Recent Labs  Lab 03/01/22 0420  AST 16  ALT 5  ALKPHOS 45  BILITOT 0.8  PROT 4.6*  ALBUMIN 1.8*    CBG: Recent Labs  Lab 03/01/22 0013 03/01/22 0424 03/01/22 0858 03/01/22 1037 03/01/22 1604  GLUCAP 167* 178* 179* 194* 193*     No results found for this or any previous visit (from the past 240 hour(s)).       Radiology Studies: DG Chest Port 1 View  Result Date: 02/28/2022 CLINICAL DATA:  PICC placement EXAM: PORTABLE CHEST 1 VIEW COMPARISON:  02/23/2022 FINDINGS: Mild cardiomegaly. Right upper extremity PICC, tip positioned near the superior cavoatrial junction. Esophagogastric tube with tip and side port below the diaphragm. Trace bilateral pleural effusions. Calcified bilateral pulmonary nodules and pleural plaques. IMPRESSION: 1. Right upper extremity PICC, tip positioned near the superior cavoatrial junction. 2. Trace bilateral pleural effusions. 3. Esophagogastric tube with tip and side port below the diaphragm. Electronically Signed   By: Delanna Ahmadi M.D.   On: 02/28/2022 14:46   [place] EKG SITE RITE  Result Date: 02/28/2022 If Site Rite image not attached, placement  could not be confirmed due to current cardiac rhythm.  DG Abd Portable 1V  Result Date: 02/28/2022 CLINICAL DATA:  Follow-up small bowel obstruction EXAM: PORTABLE ABDOMEN - 1 VIEW COMPARISON:  02/27/2022.  02/26/2022.  02/25/2022 FINDINGS: Previously seen contrast present throughout the colon. Few dilated loops of small bowel main evident, but the trend remains  favorable. Maximal dimension is 5 cm in the left central abdomen. Nasogastric tube tip in the stomach. IMPRESSION: Previously administered contrast present throughout the colon, making slow progression. Continued improvement of the small bowel gas pattern, but with Meliah Appleman few persistent gas-filled dilated loops. Electronically Signed   By: Nelson Chimes M.D.   On: 02/28/2022 08:10        Scheduled Meds:   stroke: early stages of recovery book   Does not apply Once   aspirin  81 mg Oral Daily   Carbidopa-Levodopa ER  1 tablet Oral QPM   Carbidopa-Levodopa ER  2 tablet Oral BID   Chlorhexidine Gluconate Cloth  6 each Topical Daily   enoxaparin (LOVENOX) injection  30 mg Subcutaneous Q24H   insulin aspart  0-9 Units Subcutaneous Q4H   leptospermum manuka honey  1 application  Topical Daily   levothyroxine  25 mcg Oral Daily   metoprolol tartrate  12.5 mg Oral BID   sodium chloride flush  10-40 mL Intracatheter Q12H   tamsulosin  0.4 mg Oral Daily   Continuous Infusions:  sodium chloride 35 mL/hr at 03/01/22 0700   potassium chloride 10 mEq (03/01/22 1654)   TPN ADULT (ION) 35 mL/hr at 03/01/22 0700   TPN ADULT (ION)       LOS: 6 days    Time spent: over 30 min    Fayrene Helper, MD [facility]   To contact the attending provider between 7A-7P or the covering provider during after hours 7P-7A, please log into the web site [email or web address] and access using universal [place] password for that web site. If you do not have the password, please call the hospital operator.  03/01/2022, 5:21 PM

## 2022-03-01 NOTE — Assessment & Plan Note (Signed)
Baseline appears to be around 2.2? Improved with IVF Peaked around 2.4ish Currently lower than baseline, trend

## 2022-03-02 DIAGNOSIS — I480 Paroxysmal atrial fibrillation: Secondary | ICD-10-CM | POA: Diagnosis not present

## 2022-03-02 LAB — COMPREHENSIVE METABOLIC PANEL
ALT: 6 U/L (ref 0–44)
AST: 17 U/L (ref 15–41)
Albumin: 1.7 g/dL — ABNORMAL LOW (ref 3.5–5.0)
Alkaline Phosphatase: 47 U/L (ref 38–126)
Anion gap: 7 (ref 5–15)
BUN: 38 mg/dL — ABNORMAL HIGH (ref 8–23)
CO2: 21 mmol/L — ABNORMAL LOW (ref 22–32)
Calcium: 7.8 mg/dL — ABNORMAL LOW (ref 8.9–10.3)
Chloride: 109 mmol/L (ref 98–111)
Creatinine, Ser: 1.59 mg/dL — ABNORMAL HIGH (ref 0.61–1.24)
GFR, Estimated: 40 mL/min — ABNORMAL LOW (ref >60)
Glucose, Bld: 240 mg/dL — ABNORMAL HIGH (ref 70–99)
Potassium: 3.9 mmol/L (ref 3.5–5.1)
Sodium: 137 mmol/L (ref 135–145)
Total Bilirubin: 0.6 mg/dL (ref 0.3–1.2)
Total Protein: 4.4 g/dL — ABNORMAL LOW (ref 6.5–8.1)

## 2022-03-02 LAB — CBC WITH DIFFERENTIAL/PLATELET
Abs Immature Granulocytes: 0.1 10*3/uL — ABNORMAL HIGH (ref 0.00–0.07)
Basophils Absolute: 0 10*3/uL (ref 0.0–0.1)
Basophils Relative: 0 %
Eosinophils Absolute: 0.2 10*3/uL (ref 0.0–0.5)
Eosinophils Relative: 2 %
HCT: 28 % — ABNORMAL LOW (ref 39.0–52.0)
Hemoglobin: 9.6 g/dL — ABNORMAL LOW (ref 13.0–17.0)
Immature Granulocytes: 1 %
Lymphocytes Relative: 7 %
Lymphs Abs: 0.8 10*3/uL (ref 0.7–4.0)
MCH: 33 pg (ref 26.0–34.0)
MCHC: 34.3 g/dL (ref 30.0–36.0)
MCV: 96.2 fL (ref 80.0–100.0)
Monocytes Absolute: 0.9 10*3/uL (ref 0.1–1.0)
Monocytes Relative: 9 %
Neutro Abs: 8.4 10*3/uL — ABNORMAL HIGH (ref 1.7–7.7)
Neutrophils Relative %: 81 %
Platelets: 102 10*3/uL — ABNORMAL LOW (ref 150–400)
RBC: 2.91 MIL/uL — ABNORMAL LOW (ref 4.22–5.81)
RDW: 14.7 % (ref 11.5–15.5)
WBC: 10.4 10*3/uL (ref 4.0–10.5)
nRBC: 0 % (ref 0.0–0.2)

## 2022-03-02 LAB — MAGNESIUM: Magnesium: 1.9 mg/dL (ref 1.7–2.4)

## 2022-03-02 LAB — TRIGLYCERIDES: Triglycerides: 66 mg/dL (ref <150)

## 2022-03-02 LAB — GLUCOSE, CAPILLARY
Glucose-Capillary: 228 mg/dL — ABNORMAL HIGH (ref 70–99)
Glucose-Capillary: 248 mg/dL — ABNORMAL HIGH (ref 70–99)
Glucose-Capillary: 243 mg/dL — ABNORMAL HIGH (ref 70–99)
Glucose-Capillary: 211 mg/dL — ABNORMAL HIGH (ref 70–99)
Glucose-Capillary: 137 mg/dL — ABNORMAL HIGH (ref 70–99)

## 2022-03-02 LAB — PHOSPHORUS: Phosphorus: 1.8 mg/dL — ABNORMAL LOW (ref 2.5–4.6)

## 2022-03-02 MED ORDER — INSULIN ASPART 100 UNIT/ML IJ SOLN
3.0000 [IU] | INTRAMUSCULAR | Status: AC
  Administered 2022-03-02 (×3): 3 [IU] via SUBCUTANEOUS

## 2022-03-02 MED ORDER — POTASSIUM PHOSPHATES 15 MMOLE/5ML IV SOLN
20.0000 mmol | Freq: Once | INTRAVENOUS | Status: AC
  Administered 2022-03-02: 20 mmol via INTRAVENOUS
  Filled 2022-03-02: qty 6.67

## 2022-03-02 MED ORDER — APIXABAN 2.5 MG PO TABS
2.5000 mg | ORAL_TABLET | Freq: Two times a day (BID) | ORAL | Status: DC
  Administered 2022-03-02 – 2022-03-09 (×14): 2.5 mg via ORAL
  Filled 2022-03-02 (×14): qty 1

## 2022-03-02 MED ORDER — BOOST / RESOURCE BREEZE PO LIQD CUSTOM
1.0000 | Freq: Two times a day (BID) | ORAL | Status: DC
  Administered 2022-03-02 – 2022-03-03 (×2): 1 via ORAL

## 2022-03-02 MED ORDER — INSULIN DETEMIR 100 UNIT/ML ~~LOC~~ SOLN
5.0000 [IU] | Freq: Two times a day (BID) | SUBCUTANEOUS | Status: DC
Start: 2022-03-02 — End: 2022-03-03
  Administered 2022-03-02: 5 [IU] via SUBCUTANEOUS
  Filled 2022-03-02 (×3): qty 0.05

## 2022-03-02 MED ORDER — TRAVASOL 10 % IV SOLN
INTRAVENOUS | Status: AC
  Filled 2022-03-02: qty 756

## 2022-03-02 MED ORDER — INSULIN ASPART 100 UNIT/ML IJ SOLN
0.0000 [IU] | INTRAMUSCULAR | Status: DC
  Administered 2022-03-02 (×2): 5 [IU] via SUBCUTANEOUS
  Administered 2022-03-02: 2 [IU] via SUBCUTANEOUS
  Administered 2022-03-02: 5 [IU] via SUBCUTANEOUS
  Administered 2022-03-03 (×3): 3 [IU] via SUBCUTANEOUS
  Administered 2022-03-04: 5 [IU] via SUBCUTANEOUS
  Administered 2022-03-04: 3 [IU] via SUBCUTANEOUS

## 2022-03-02 MED ORDER — MAGNESIUM SULFATE 2 GM/50ML IV SOLN
2.0000 g | Freq: Once | INTRAVENOUS | Status: AC
  Administered 2022-03-02: 2 g via INTRAVENOUS
  Filled 2022-03-02: qty 50

## 2022-03-02 NOTE — Progress Notes (Signed)
ANTICOAGULATION CONSULT NOTE - Initial Consult  Pharmacy Consult for apixaban Indication: atrial fibrillation  Allergies  Allergen Reactions   Amoxicillin     REACTION: rash, dizziness    Patient Measurements: Height: 5' 8" (172.7 cm) Weight: 77.2 kg (170 lb 3.1 oz) IBW/kg (Calculated) : 68.4 Heparin Dosing Weight:   Vital Signs: Temp: 97.7 F (36.5 C) (06/15 1141) Temp Source: Oral (06/15 1141) BP: 125/53 (06/15 1141) Pulse Rate: 73 (06/15 1141)  Labs: Recent Labs    02/28/22 0250 03/01/22 0420 03/02/22 0530  HGB  --  10.2* 9.6*  HCT  --  29.9* 28.0*  PLT  --  109* 102*  CREATININE 1.96* 1.87* 1.59*    Estimated Creatinine Clearance: 26.9 mL/min (A) (by C-G formula based on SCr of 1.59 mg/dL (H)).   Medical History: Past Medical History:  Diagnosis Date   ABNORMAL THYROID FUNCTION TESTS 01/17/2010   ALLERGIC RHINITIS 12/01/2008   Colon cancer (HCC)    COLONIC POLYPS, HX OF 12/01/2008   DIABETES MELLITUS, TYPE II 12/01/2008   EDEMA LEG 05/04/2009   ESSENTIAL HYPERTENSION 12/01/2008   Exocrine pancreatic insufficiency    GALLSTONES 04/07/2009   GERD 12/01/2008   Hay fever    SPONDYLOSIS, LUMBAR 12/01/2008    Medications:  Medications Prior to Admission  Medication Sig Dispense Refill Last Dose   Carbidopa-Levodopa ER (SINEMET CR) 25-100 MG tablet controlled release TAKE 2 TABLETS BY MOUTH IN THE MORNING AND 2 IN THE AFTERNOON AND 1 IN THE EVENING (Patient taking differently: 1-2 tablets See admin instructions. TAKE 2 TABLETS BY MOUTH IN THE MORNING AND 2 IN THE AFTERNOON AND 1 IN THE EVENING) 450 tablet 1 02/20/2022   furosemide (LASIX) 20 MG tablet Take one tablet by mouth once daily as needed for edema/swelling. (Patient taking differently: Take 20 mg by mouth daily.) 30 tablet 1 02/20/2022   glipiZIDE (GLUCOTROL) 5 MG tablet Take one half tablet by mouth once daily. (Patient taking differently: Take 5 mg by mouth daily before breakfast.) 60 tablet 3 02/20/2022    hydrochlorothiazide (MICROZIDE) 12.5 MG capsule TAKE 1 CAPSULE BY MOUTH EVERY DAY (Patient taking differently: Take 12.5 mg by mouth daily.) 90 capsule 0 02/20/2022   levothyroxine (SYNTHROID) 25 MCG tablet Take 1 tablet (25 mcg total) by mouth daily. 90 tablet 1 02/20/2022   lipase/protease/amylase (CREON) 12000-38000 units CPEP capsule Take 12,000 Units by mouth 3 (three) times daily before meals.   02/20/2022   ondansetron (ZOFRAN-ODT) 4 MG disintegrating tablet Take 1 tablet (4 mg total) by mouth every 8 (eight) hours as needed for nausea or vomiting. 20 tablet 0 02/21/2022   polyethylene glycol (MIRALAX / GLYCOLAX) packet Take 17 g by mouth daily as needed for moderate constipation.   02/08/2022   tamsulosin (FLOMAX) 0.4 MG CAPS capsule TAKE 1 CAPSULE BY MOUTH EVERY DAY (Patient taking differently: 0.4 mg daily.) 90 capsule 0 02/20/2022   triamcinolone cream (KENALOG) 0.1 % APPLY  CREAM EXTERNALLY TO AFFECTED AREA TWICE DAILY AS NEEDED (Patient taking differently: 1 application. daily as needed (itching).) 80 g 2 12/28/2021   blood glucose meter kit and supplies KIT Dispense based on patient and insurance preference. Use up to four times daily as directed. (FOR ICD-9 250.00, 250.01). 1 each 0    Blood Glucose Monitoring Suppl (ONE TOUCH ULTRA 2) w/Device KIT Use as directed. 1 kit 0    glucose blood (ONETOUCH ULTRA) test strip USE TO TEST BLOOD SUGAR 3 TIMES A DAY 300 strip 1      Lancet Device MISC Use 1-4 times daily as needed or directed.  DX E11.9 1 each 3    Lancets (ONETOUCH ULTRASOFT) lancets Check 3 times daily. E11.9 300 each 3    Scheduled:    stroke: early stages of recovery book   Does not apply Once   aspirin  81 mg Oral Daily   Carbidopa-Levodopa ER  1 tablet Oral QPM   Carbidopa-Levodopa ER  2 tablet Oral BID   Chlorhexidine Gluconate Cloth  6 each Topical Daily   enoxaparin (LOVENOX) injection  30 mg Subcutaneous Q24H   feeding supplement  1 Container Oral BID BM   insulin aspart  0-15  Units Subcutaneous Q4H   insulin detemir  5 Units Subcutaneous BID   leptospermum manuka honey  1 application  Topical Daily   levothyroxine  25 mcg Oral Daily   metoprolol tartrate  12.5 mg Oral BID   sodium chloride flush  10-40 mL Intracatheter Q12H   tamsulosin  0.4 mg Oral Daily    Assessment: Plan to start apixaban for AF. Age>80, scr>1.5  Goal of Therapy:   Monitor platelets by anticoagulation protocol: Yes   Plan:  Apixaban 2.28m PO BID Rx will follow peripherally  MOnnie Boer PharmD, BCIDP, AAHIVP, CPP Infectious Disease Pharmacist 03/02/2022 4:52 PM

## 2022-03-02 NOTE — Progress Notes (Signed)
Nutrition Follow-up  DOCUMENTATION CODES:   Not applicable  INTERVENTION:  TPN per Pharmacy.  Provide Boost Breeze po BID, each supplement provides 250 kcal and 9 grams of protein.  NUTRITION DIAGNOSIS:   Inadequate oral intake related to inability to eat as evidenced by NPO status; diet advanced; progressed  GOAL:   Patient will meet greater than or equal to 90% of their needs; Met with TPN  MONITOR:   Diet advancement, Labs, Weight trends, Skin, I & O's  REASON FOR ASSESSMENT:   Consult Assessment of nutrition requirement/status  ASSESSMENT:   88 y.o. male with medical history significant of paroxysmal A-fib not on anticoagulation, hypertension, hypothyroidism, Parkinson's disease, lumbar spondylosis, type 2 diabetes, hyperlipidemia, PVD, CKD stage IIIb, remote history of colon cancer, BPH, pancreatic insufficiency, gallstones, GERD presents with SBO.  NGT clamped. Diet has been advanced to a clear liquid diet. Pt has been tolerating his liquids well thus far. Per MD, if pt continues to tolerate his PO, possible plans to d/c NGT tomorrow. RD to order nutritional supplements to aid in PO intake. Pt continues on full TPN support with goal rate at 70 ml/hr to provide 1743 kcal and 76 grams of protein. Per MD, plans to continue TPN.   Labs and medications reviewed.   Diet Order:   Diet Order             Diet clear liquid Room service appropriate? Yes; Fluid consistency: Thin  Diet effective now                   EDUCATION NEEDS:   Not appropriate for education at this time  Skin:  Skin Assessment: Reviewed RN Assessment Skin Integrity Issues:: Unstageable Stage II: perineum Unstageable: coccyx  Last BM:  6/15  Height:   Ht Readings from Last 1 Encounters:  02/22/22 $RemoveB'5\' 8"'aHcZkZTw$  (1.727 m)    Weight:   Wt Readings from Last 1 Encounters:  03/02/22 77.2 kg   BMI:  Body mass index is 25.88 kg/m.  Estimated Nutritional Needs:   Kcal:   1700-1850  Protein:  75-85 grams  Fluid:  >/= 1.7 L/day  Corrin Parker, MS, RD, LDN RD pager number/after hours weekend pager number on Amion.

## 2022-03-02 NOTE — Progress Notes (Addendum)
Patient ID: Steven Ward, male   DOB: 02-12-1925, 88 y.o.   MRN: [id number] [facility] Surgery Progress Note     Subjective: Continues to have BMs.  NGT has been clamped and he denies nausea.  Still with flatus and 3 stools yesterday  Objective: Vital signs in last 24 hours: Temp:  [97.9 F (36.6 C)-98 F (36.7 C)] 98 F (36.7 C) (06/15 0428) Pulse Rate:  [69-81] 69 (06/15 0428) Resp:  [18-20] 20 (06/15 0428) BP: (127-148)/(42-91) 127/42 (06/15 0428) SpO2:  [90 %-100 %] 95 % (06/15 0428) Weight:  [77.2 kg] 77.2 kg (06/15 0428) Last BM Date : 03/02/22  Intake/Output from previous day: 06/14 0701 - 06/15 0700 In: 1802.2 [P.O.:50; I.V.:1199.6; IV Piggyback:552.6] Out: 1535 [Urine:1160; Emesis/NG output:375] Intake/Output this shift: No intake/output data recorded.  PE: Gen:  Alert, NAD, pleasant Abd: soft, ND, +BS, NGT with no residual currently, NT  Lab Results:  Recent Labs    03/01/22 0420 03/02/22 0530  WBC 10.4 10.4  HGB 10.2* 9.6*  HCT 29.9* 28.0*  PLT 109* 102*   BMET Recent Labs    03/01/22 0420 03/02/22 0530  NA 137 137  K 3.5 3.9  CL 111 109  CO2 19* 21*  GLUCOSE 185* 240*  BUN 45* 38*  CREATININE 1.87* 1.59*  CALCIUM 7.7* 7.8*   PT/INR No results for input(s): "LABPROT", "INR" in the last 72 hours. CMP     Component Value Date/Time   NA 137 03/02/2022 0530   K 3.9 03/02/2022 0530   CL 109 03/02/2022 0530   CO2 21 (L) 03/02/2022 0530   GLUCOSE 240 (H) 03/02/2022 0530   BUN 38 (H) 03/02/2022 0530   CREATININE 1.59 (H) 03/02/2022 0530   CALCIUM 7.8 (L) 03/02/2022 0530   PROT 4.4 (L) 03/02/2022 0530   ALBUMIN 1.7 (L) 03/02/2022 0530   AST 17 03/02/2022 0530   ALT 6 03/02/2022 0530   ALKPHOS 47 03/02/2022 0530   BILITOT 0.6 03/02/2022 0530   GFRNONAA 40 (L) 03/02/2022 0530   GFRAA  04/26/2009 0920    >60        The eGFR has been calculated using the MDRD equation. This calculation has not been validated in all  clinical situations. eGFR's persistently <60 mL/min signify possible Chronic Kidney Disease.   Lipase  No results found for: "LIPASE"     Studies/Results: DG Chest Port 1 View  Result Date: 02/28/2022 CLINICAL DATA:  PICC placement EXAM: PORTABLE CHEST 1 VIEW COMPARISON:  02/23/2022 FINDINGS: Mild cardiomegaly. Right upper extremity PICC, tip positioned near the superior cavoatrial junction. Esophagogastric tube with tip and side port below the diaphragm. Trace bilateral pleural effusions. Calcified bilateral pulmonary nodules and pleural plaques. IMPRESSION: 1. Right upper extremity PICC, tip positioned near the superior cavoatrial junction. 2. Trace bilateral pleural effusions. 3. Esophagogastric tube with tip and side port below the diaphragm. Electronically Signed   By: Jearld Lesch M.D.   On: 02/28/2022 14:46   [place] EKG SITE RITE  Result Date: 02/28/2022 If Site Rite image not attached, placement could not be confirmed due to current cardiac rhythm.   Anti-infectives: Anti-infectives (From admission, onward)    Start     Dose/Rate Route Frequency Ordered Stop   02/23/22 0000  cefTRIAXone (ROCEPHIN) 2 g in sodium chloride 0.9 % 100 mL IVPB  Status:  Discontinued        2 g 200 mL/hr over 30 Minutes Intravenous Daily at 10 pm 02/22/22 2346 02/28/22 1142  02/23/22 0000  metroNIDAZOLE (FLAGYL) IVPB 500 mg  Status:  Discontinued        500 mg 100 mL/hr over 60 Minutes Intravenous 2 times daily 02/22/22 2346 02/23/22 1802        Assessment/Plan Partial SBO -repeat protocol with contrast throughout the colon, but still with some dilated loops of small bowel and NGT output.  Query a component of bowel motility vs partial obstructive picture.  He is clearly not completely obstructed -still having bowel function -low dose reglan added  -leave NGT clamped today and given a CLD tray and see how he does.  If he tolerates this well then will plan to remove NGT tomorrow -cont PICC/TNA  while unable to eat  ADDENDUM: called and spoke to Steven Ward, son, and grandson on the phone for surgical updates.    FEN - NGT, clamped/CLD, IVF/TNA VTE - hold eliquis, ok for heparin gtt or chemical prophylaxis from our standpoint ID - rocephin 6/8>> for CAP   New CVA this admit DM HTN Pancreatic insufficiency A fib on eliquis  Parkinson's disease Hypothyroidism BPH Pulmonary fibrosis/RLL mass   I reviewed hospitalist notes, last 24 h vitals and pain scores, last 48 h intake and output, last 24 h labs and trends, and last 24 h imaging results.     LOS: 7 days    [name], [facility] Surgery 03/02/2022, 7:56 AM Please see Amion for pager number during day hours 7:00am-4:30pm

## 2022-03-02 NOTE — Progress Notes (Signed)
NGT clamped since yesterday pt tolerated well no complaints tolerated clear liquid diet well thus far.

## 2022-03-02 NOTE — Progress Notes (Signed)
PROGRESS NOTE    Steven Ward  IEP:[id number] DOB: 07-26-1925 DOA: 02/22/2022 PCP: [name], MD  Chief Complaint  Patient presents with   Weakness   Fatigue    Brief Narrative:  Steven Ward is Steven Ward 88 y.o. male with medical history significant of paroxysmal Steven Ward not on anticoagulation, hypertension, hypothyroidism, Parkinson's disease, lumbar spondylosis, type 2 diabetes, hyperlipidemia, PVD, CKD stage IIIb, remote history of colon cancer, BPH, pancreatic insufficiency, gallstones, GERD. Patient presented secondary to generalized weakness and found to have evidence of atrial fibrillation, SBO and acute infarct. General surgery consulted; NPO with NG tube placed. Cardiology consulted and patient's rate managed with beta blocker. Neurology consulted for stroke. SBO persistent and likely partial. PICC ordered for TPN starting 6/13.    Assessment & Plan:   Principal Problem:   Paroxysmal atrial fibrillation (HCC) Active Problems:   SBO (small bowel obstruction) (HCC)   Hypokalemia   Hypomagnesemia   Hypophosphatemia   Acute kidney injury superimposed on chronic kidney disease (HCC)   Metabolic acidosis   Acute CVA (cerebrovascular accident) ([place])   Generalized weakness   Essential hypertension   Pneumonia   T2DM (type 2 diabetes mellitus) ([place])   PD (Parkinson's disease) ([place])   Hypothyroidism   BPH (benign prostatic hyperplasia)   Decubitus ulcer   Compression fracture of body of thoracic vertebra (HCC)   Pleural plaque   Pulmonary fibrosis (HCC)   Right lower lobe lung mass   CKD stage 3 due to type 2 diabetes mellitus (HCC)   Nausea & vomiting   Stage 3b chronic kidney disease (CKD) (HCC)   Assessment and Plan: * Paroxysmal atrial fibrillation ([place]) Rate controlled. Cardiology consulted. Transthoracic Echocardiogram ordered and significant for severely dilated left atrium with normal LVEF. Anticoagulation deferred secondary to acute infarct, per  cardiology. Started on metoprolol PO -Cardiology recommendations: Metoprolol 12.5 mg BID -resume eliquis  SBO (small bowel obstruction) ([place]) KUB 6/13 with contrast throughout colon Improvement in small bowel gas pattern, few persistent gas filled dilated loops NGT clamped, CLD trial, plan for removal tomorrow of NGT if doing well Continue PICC/TNA for now Appreciate assistance  Hypokalemia Will follow, supplement as needed Goal >4   Hypophosphatemia Replace and follow  Hypomagnesemia Goal >2 Follow, supplement prn  Acute kidney injury superimposed on chronic kidney disease ([place]) Baseline appears to be around 2.2? Improved with IVF Peaked around 2.4ish Currently lower than baseline, trend  Metabolic acidosis Mild, follow  Acute CVA (cerebrovascular accident) ([place]) Acute punctate infarct noted on MRI located within the right cerebellar hemisphere in addition to possible subacute infarct of right frontal lobe. LDL of 34. Hemoglobin A1C of 7.7%. Transthoracic Echocardiogram with normal LVEF of 55-60% with no evidence of atrial level shunt. -Neurology recommendations: Aspirin 81 mg, Eliquis. Outpatient follow-up. Signed off. -PT/OT recommending CIR -SLP recommendations to be determind (complicated by SBO)    Generalized weakness CIR recommended by therapy  Essential hypertension On thiazide and loop outpatient - hold Continue metoprolol (started inpatient)  Pneumonia S/p treatment with ceftriaxone/flagyl for pneumonia  T2DM (type 2 diabetes mellitus) ([place]) SSI A1c 7.9 Will add some basal  PD (Parkinson's disease) ([place]) sinemet  Hypothyroidism synthroid  BPH (benign prostatic hyperplasia) flomax  Right lower lobe lung mass PCP and/or pulm follow up outpatient Needs repeat imaging within 3 months  Pulmonary fibrosis ([place]) pulm f/u outpatient  Pleural plaque Follow with pulm outpatient  Compression fracture of body of thoracic vertebra (HCC) T5 and  T12, noted, follow  Decubitus  ulcer Pressure Injury 02/25/22 Coccyx Unstageable - Full thickness tissue loss in which the base of the injury is covered by slough (yellow, tan, gray, green or brown) and/or eschar (tan, brown or black) in the wound bed. (Active)  02/25/22 2100  Location: Coccyx  Location Orientation:   Staging: Unstageable - Full thickness tissue loss in which the base of the injury is covered by slough (yellow, tan, gray, green or brown) and/or eschar (tan, brown or black) in the wound bed.  Wound Description (Comments):   Present on Admission: Yes          DVT prophylaxis: eliquis Code Status: dnr Family Communication: none Disposition:   Status is: Inpatient Remains inpatient appropriate because: pending further improvement   Consultants:  Surgery Cards neuro  Procedures:  Carotid US Summary:  Right Carotid: Velocities in the right ICA are consistent with [name] 1-39%  stenosis.   Left Carotid: Velocities in the left ICA are consistent with [name] 1-39%  stenosis.   Vertebrals:  Bilateral vertebral arteries demonstrate antegrade flow.  Subclavians: Normal flow hemodynamics were seen in bilateral subclavian               arteries.   Echo IMPRESSIONS     1. Left ventricular ejection fraction, by estimation, is 55 to 60%. The  left ventricle has normal function. The left ventricle has no regional  wall motion abnormalities. There is mild concentric left ventricular  hypertrophy. Left ventricular diastolic  function could not be evaluated.   2. Right ventricular systolic function is normal. The right ventricular  size is normal. There is normal pulmonary artery systolic pressure.   3. Left atrial size was severely dilated.   4. The mitral valve is normal in structure. Trivial mitral valve  regurgitation. No evidence of mitral stenosis.   5. The aortic valve is calcified. There is mild calcification of the  aortic valve. There is mild thickening of the  aortic valve. Aortic valve  regurgitation is not visualized. No aortic stenosis is present.   6. The inferior vena cava is normal in size with greater than 50%  respiratory variability, suggesting right atrial pressure of 3 mmHg.   Antimicrobials:  Anti-infectives (From admission, onward)    Start     Dose/Rate Route Frequency Ordered Stop   02/23/22 0000  cefTRIAXone (ROCEPHIN) 2 g in sodium chloride 0.9 % 100 mL IVPB  Status:  Discontinued        2 g 200 mL/hr over 30 Minutes Intravenous Daily at 10 pm 02/22/22 2346 02/28/22 1142   02/23/22 0000  metroNIDAZOLE (FLAGYL) IVPB 500 mg  Status:  Discontinued        500 mg 100 mL/hr over 60 Minutes Intravenous 2 times daily 02/22/22 2346 02/23/22 1802       Subjective: No complaints Tolerating clears well, no nausea/pain   Objective: Vitals:   03/01/22 2117 03/02/22 0428 03/02/22 0720 03/02/22 1141  BP: (!) 147/62 (!) 127/42 (!) 131/45 (!) 125/53  Pulse: 72 69 90 73  Resp: '18 20 20 18  '$ Temp: 98 F (36.7 C) 98 F (36.7 C) 98.2 F (36.8 C) 97.7 F (36.5 C)  TempSrc: Oral Oral Oral Oral  SpO2: 100% 95% 100% 90%  Weight:  77.2 kg    Height:        Intake/Output Summary (Last 24 hours) at 03/02/2022 1646 Last data filed at 03/02/2022 1141 Gross per 24 hour  Intake 1367.55 ml  Output 1760 ml  Net -392.45 ml  Filed Weights   02/27/22 0407 03/01/22 0456 03/02/22 0428  Weight: 75.4 kg 75.4 kg 77.2 kg    Examination:  General: No acute distress. Sitting up in chair enjoying clear liquid tray. Cardiovascular: RRR Lungs: unlabored Abdomen: nontender, distended, NG in place Neurological: Alert and oriented 3. Moves all extremities 4 with equal strength. Cranial nerves II through XII grossly intact. Extremities: No clubbing or cyanosis. No edema.  Data Reviewed: I have personally reviewed following labs and imaging studies  CBC: Recent Labs  Lab 02/27/22 0210 03/01/22 0420 03/02/22 0530  WBC 10.6* 10.4 10.4   NEUTROABS  --   --  8.4*  HGB 11.0* 10.2* 9.6*  HCT 32.8* 29.9* 28.0*  MCV 97.9 97.7 96.2  PLT 131* 109* 102*    Basic Metabolic Panel: Recent Labs  Lab 02/25/22 1648 02/26/22 1425 02/27/22 0210 02/28/22 0250 03/01/22 0420 03/02/22 0530  NA 142  --  140 140 137 137  K 2.8* 4.2 3.4* 3.6 3.5 3.9  CL 109  --  115* 110 111 109  CO2 20*  --  18* 18* 19* 21*  GLUCOSE 207*  --  130* 102* 185* 240*  BUN 78*  --  56* 51* 45* 38*  CREATININE 2.26*  --  1.99* 1.96* 1.87* 1.59*  CALCIUM 7.8*  --  7.6* 8.0* 7.7* 7.8*  MG  --  2.0 1.8 2.1 1.9 1.9  PHOS  --   --   --   --  2.5 1.8*    GFR: Estimated Creatinine Clearance: 26.9 mL/min (Tory Septer) (by C-G formula based on SCr of 1.59 mg/dL (H)).  Liver Function Tests: Recent Labs  Lab 03/01/22 0420 03/02/22 0530  AST 16 17  ALT 5 6  ALKPHOS 45 47  BILITOT 0.8 0.6  PROT 4.6* 4.4*  ALBUMIN 1.8* 1.7*    CBG: Recent Labs  Lab 03/01/22 2347 03/02/22 0428 03/02/22 0735 03/02/22 1106 03/02/22 1535  GLUCAP 203* 228* 248* 243* 211*     No results found for this or any previous visit (from the past 240 hour(s)).       Radiology Studies: No results found.      Scheduled Meds:   stroke: early stages of recovery book   Does not apply Once   aspirin  81 mg Oral Daily   Carbidopa-Levodopa ER  1 tablet Oral QPM   Carbidopa-Levodopa ER  2 tablet Oral BID   Chlorhexidine Gluconate Cloth  6 each Topical Daily   enoxaparin (LOVENOX) injection  30 mg Subcutaneous Q24H   feeding supplement  1 Container Oral BID BM   insulin aspart  0-15 Units Subcutaneous Q4H   insulin detemir  5 Units Subcutaneous BID   leptospermum manuka honey  1 application  Topical Daily   levothyroxine  25 mcg Oral Daily   metoprolol tartrate  12.5 mg Oral BID   sodium chloride flush  10-40 mL Intracatheter Q12H   tamsulosin  0.4 mg Oral Daily   Continuous Infusions:  TPN ADULT (ION) 70 mL/hr at 03/02/22 0457   TPN ADULT (ION)       LOS: 7 days     Time spent: over 30 min    Fayrene Helper, MD [facility]   To contact the attending provider between 7A-7P or the covering provider during after hours 7P-7A, please log into the web site [email or web address] and access using universal [place] password for that web site. If you do not have the password, please call the hospital operator.  03/02/2022,  4:46 PM

## 2022-03-02 NOTE — Assessment & Plan Note (Signed)
Replace and follow. ?

## 2022-03-02 NOTE — Progress Notes (Signed)
PHARMACY - TOTAL PARENTERAL NUTRITION CONSULT NOTE  Indication: Small bowel obstruction with ileus  Patient Measurements: Height: '5\' 8"'$  (172.7 cm) Weight: 77.2 kg (170 lb 3.1 oz) IBW/kg (Calculated) : 68.4 TPN AdjBW (KG): 65.3 Body mass index is 25.88 kg/m.  Assessment:  88 y.o. male with medical history significant of PAF not on AC, HTN, hypothyroidism, Parkinson's, lumbar spondylosis, T2DM, HLD, PVD, CKD3b, remote history of colon cancer, BPH, pancreatic insufficiency, gallstones, GERD presents with partial SBO. Managing conservatively for now. Pharmacy consulted to manage TPN.  Glucose / Insulin: hx DM on glipizide PTA, A1c 7.9%. CBGs elevated in the 200s post TPN advancement to goal Used 13 units SSI over the past 24 hrs Electrolytes: K 3.9 post 6 runs (goal >/= 4), Mag 1.9 post 2gm (goal >/= 2), Phos down to 1.8, others WNL Renal: SCr down 1.59, BUN 38 Hepatic: LFTs / tbili / TG WNL, albumin 1.7 Intake / Output; MIVF: UOP 0.6 ml/kg/hr, NG 330m (clamped), BMM x3 on 6/14 GI Imaging: 6/12 - Ileus pattern without significant change 6/13 - improvement of SB gas pattern, few persistent gas-filled dilated loops GI Surgeries / Procedures:  None to date  Central access: PICC placed 02/28/22 TPN start date: 02/28/22  Nutritional Goals:  RD Estimated Needs Total Energy Estimated Needs: 1700-1850 Total Protein Estimated Needs: 75-85 grams Total Fluid Estimated Needs: >/= 1.7 L/day  Current Nutrition:  TPN  Plan:  Continue TPN at goal rate of 70 mL/hr to provide 76g AA, 286g CHO and 47g ILE for a total of 1743 kCal, meeting 100% of needs Electrolytes in TPN: Na 577m/L, increase K 35103mL, Ca 5mE61m, increase Mg 5mEq66m increase Phos 12mmo26m Cl:Ac 1:2 for now Add standard MVI and trace elements to TPN Increase SSI to moderate Q4H and add 15 units regular insulin to TPN Add Novolog 3 units Q4H x 3 doses until new TPN bag starts KPhos 20mmol6mRepeat mag sulfate 2gm IV x 1 F/u  AM labs  Shanae Luo D. Kaylany Tesoriero, PMina MarbleD, BCPS, BCCCP [id number], 7:29 AM

## 2022-03-02 NOTE — Care Management Important Message (Signed)
Important Message  Patient Details  Name: Steven Ward MRN: [id number] Date of Birth: 24-Nov-1924   Medicare Important Message Given:  Yes     [name] 03/02/2022, 7:50 AM

## 2022-03-02 NOTE — Progress Notes (Signed)
Physical Therapy Treatment Patient Details Name: Steven Ward MRN: [id number] DOB: [birth year] Today's Date: 03/02/2022  Pt reports he is feeling better and is hopeful that NG tube will be removed tomorrow. Pt asks to use BSC and is modA for stand pivot to [facility] from recliner. Pt then request to go back to bed as he has been sitting up for some time. Pt able to progress ambulation 5 feet with RW and modAx2. D/c plan remains appropriate at this time as now that pt is not hooked to suction, mobility distance can be progressed. PT will continue to follow acutely.   03/02/22 1400  PT Visit Information  Last PT Received On 03/02/22  Assistance Needed +2 (for gait progression)  History of Present Illness The pt is a 88 yo male presenting 6/7 with weakness, fatigue, poor appetite, and fall while walking to the bathroom on day of admission. Pt found to be in afib with frequent PVCs (new after ablation 20 years ago). CT revealed mid/distal SBO, NG tube placed 6/7. MRI revealed R cerebellar infarct. PMH includes: DM II, HLD, PVD, PD, PAF, CKD III, colon cancer, and BPH.  Subjective Data  Patient Stated Goal none stated  Precautions  Precautions Fall  Precaution Comments NG tube  Restrictions  Weight Bearing Restrictions No  Pain Assessment  Pain Assessment No/denies pain  Cognition  Arousal/Alertness Awake/alert  Behavior During Therapy Flat affect  Overall Cognitive Status No family/caregiver present to determine baseline cognitive functioning  Area of Impairment Following commands;Safety/judgement;Awareness;Problem solving  Following Commands Follows multi-step commands with increased time  Safety/Judgement Decreased awareness of safety;Decreased awareness of deficits  Awareness Emergent  Problem Solving Slow processing;Decreased initiation;Difficulty sequencing;Requires verbal cues  General Comments pt with better positional awareness and active participation  Bed Mobility  Overal bed  mobility Needs Assistance  Bed Mobility Sit to Supine  Sit to supine Mod assist  General bed mobility comments modA to manage LE back into bed and straighten trunk  Transfers  Overall transfer level Needs assistance  Equipment used Rolling walker (2 wheels);1 person hand held assist  Transfers Sit to/from Stand;Bed to chair/wheelchair/BSC  Sit to Stand Mod assist  Bed to/from chair/wheelchair/BSC transfer type: Step pivot  Step pivot transfers Mod assist  General transfer comment modA for sit<>stand from recliner and from [place] for face to face stepping transfer from recliner to [facility]  Ambulation/Gait  Ambulation/Gait assistance Mod assist;+2 physical assistance  Gait Distance (Feet) 5 Feet  Assistive device Rolling walker (2 wheels)  Gait Pattern/deviations Decreased step length - right;Decreased step length - left;Shuffle;Leaning posteriorly;Narrow base of support  General Gait Details pt initially refused ambulation but agreeable to walk from [facility] to bed, pt able to take very small steps with RW and mod A for pushing backwards,  Modified Rankin (Stroke Patients Only)  Pre-Morbid Rankin Score 4  Modified Rankin 4  Balance  Overall balance assessment Needs assistance  Sitting-balance support Bilateral upper extremity supported;Feet supported  Sitting balance-Leahy Scale Fair  Postural control Left lateral lean;Posterior lean  Standing balance support Bilateral upper extremity supported;During functional activity  Standing balance-Leahy Scale Poor  Standing balance comment dependent on BUE support and assist, up to modA due to posterior lean  PT - End of Session  Equipment Utilized During Treatment Gait belt  Activity Tolerance Patient tolerated treatment well  Patient left in chair;with call bell/phone within reach;with chair alarm set  Nurse Communication Mobility status   PT - Assessment/Plan  PT Plan Current plan remains appropriate  PT Visit Diagnosis Other abnormalities of  gait and mobility (R26.89);Muscle weakness (generalized) (M62.81);History of falling (Z91.81)  PT Frequency (ACUTE ONLY) Min 4X/week  Recommendations for Other Services Rehab consult  Follow Up Recommendations Acute inpatient rehab (3hours/day)  Assistance recommended at discharge Frequent or constant Supervision/Assistance  Patient can return home with the following Two people to help with walking and/or transfers;A lot of help with bathing/dressing/bathroom;Assistance with cooking/housework;Direct supervision/assist for medications management;Direct supervision/assist for financial management;Assist for transportation;Help with stairs or ramp for entrance  PT equipment  (defer to post acute)  AM-PAC PT "6 Clicks" Mobility Outcome Measure (Version 2)  Help needed turning from your back to your side while in a flat bed without using bedrails? 2  Help needed moving from lying on your back to sitting on the side of a flat bed without using bedrails? 2  Help needed moving to and from a bed to a chair (including a wheelchair)? 2  Help needed standing up from a chair using your arms (e.g., wheelchair or bedside chair)? 2  Help needed to walk in hospital room? 1 (unable to walk 20 ft)  Help needed climbing 3-5 steps with a railing?  1  6 Click Score 10  Consider Recommendation of Discharge To: CIR/SNF/LTACH  Progressive Mobility  What is the highest level of mobility based on the progressive mobility assessment? Level 3 (Stands with assist) - Balance while standing  and cannot march in place  Activity Transferred from chair to bed  PT Goal Progression  Progress towards PT goals Progressing toward goals  Acute Rehab PT Goals  PT Goal Formulation Patient unable to participate in goal setting  Time For Goal Achievement 03/09/22  Potential to Achieve Goals Good  PT Time Calculation  PT Start Time (ACUTE ONLY) 1356  PT Stop Time (ACUTE ONLY) 1428  PT Time Calculation (min) (ACUTE ONLY) 32 min  PT  General Charges  $$ ACUTE PT VISIT 1 Visit  PT Treatments  $Therapeutic Activity 23-37 mins          Allyse Fregeau B. Migdalia Dk PT, DPT Acute Rehabilitation Services Please use secure chat or  Call Office [phone]    [place] 03/02/2022, 6:15 PM

## 2022-03-03 DIAGNOSIS — I480 Paroxysmal atrial fibrillation: Secondary | ICD-10-CM | POA: Diagnosis not present

## 2022-03-03 LAB — CBC WITH DIFFERENTIAL/PLATELET
Abs Immature Granulocytes: 0.1 10*3/uL — ABNORMAL HIGH (ref 0.00–0.07)
Basophils Absolute: 0 10*3/uL (ref 0.0–0.1)
Basophils Relative: 0 %
Eosinophils Absolute: 0.2 10*3/uL (ref 0.0–0.5)
Eosinophils Relative: 2 %
HCT: 28.9 % — ABNORMAL LOW (ref 39.0–52.0)
Hemoglobin: 9.8 g/dL — ABNORMAL LOW (ref 13.0–17.0)
Immature Granulocytes: 1 %
Lymphocytes Relative: 6 %
Lymphs Abs: 0.7 10*3/uL (ref 0.7–4.0)
MCH: 32.9 pg (ref 26.0–34.0)
MCHC: 33.9 g/dL (ref 30.0–36.0)
MCV: 97 fL (ref 80.0–100.0)
Monocytes Absolute: 1.1 10*3/uL — ABNORMAL HIGH (ref 0.1–1.0)
Monocytes Relative: 9 %
Neutro Abs: 9.7 10*3/uL — ABNORMAL HIGH (ref 1.7–7.7)
Neutrophils Relative %: 82 %
Platelets: 104 10*3/uL — ABNORMAL LOW (ref 150–400)
RBC: 2.98 MIL/uL — ABNORMAL LOW (ref 4.22–5.81)
RDW: 14.8 % (ref 11.5–15.5)
WBC: 11.8 10*3/uL — ABNORMAL HIGH (ref 4.0–10.5)
nRBC: 0 % (ref 0.0–0.2)

## 2022-03-03 LAB — COMPREHENSIVE METABOLIC PANEL
ALT: <5 U/L (ref 0–44)
AST: 24 U/L (ref 15–41)
Albumin: 1.8 g/dL — ABNORMAL LOW (ref 3.5–5.0)
Alkaline Phosphatase: 50 U/L (ref 38–126)
Anion gap: 6 (ref 5–15)
BUN: 41 mg/dL — ABNORMAL HIGH (ref 8–23)
CO2: 22 mmol/L (ref 22–32)
Calcium: 8 mg/dL — ABNORMAL LOW (ref 8.9–10.3)
Chloride: 109 mmol/L (ref 98–111)
Creatinine, Ser: 1.61 mg/dL — ABNORMAL HIGH (ref 0.61–1.24)
GFR, Estimated: 39 mL/min — ABNORMAL LOW (ref >60)
Glucose, Bld: 82 mg/dL (ref 70–99)
Potassium: 3.7 mmol/L (ref 3.5–5.1)
Sodium: 137 mmol/L (ref 135–145)
Total Bilirubin: 0.7 mg/dL (ref 0.3–1.2)
Total Protein: 4.8 g/dL — ABNORMAL LOW (ref 6.5–8.1)

## 2022-03-03 LAB — GLUCOSE, CAPILLARY
Glucose-Capillary: 107 mg/dL — ABNORMAL HIGH (ref 70–99)
Glucose-Capillary: 157 mg/dL — ABNORMAL HIGH (ref 70–99)
Glucose-Capillary: 163 mg/dL — ABNORMAL HIGH (ref 70–99)
Glucose-Capillary: 181 mg/dL — ABNORMAL HIGH (ref 70–99)
Glucose-Capillary: 80 mg/dL (ref 70–99)
Glucose-Capillary: 90 mg/dL (ref 70–99)

## 2022-03-03 LAB — MAGNESIUM: Magnesium: 2.2 mg/dL (ref 1.7–2.4)

## 2022-03-03 LAB — PHOSPHORUS: Phosphorus: 2.4 mg/dL — ABNORMAL LOW (ref 2.5–4.6)

## 2022-03-03 MED ORDER — POTASSIUM CHLORIDE 10 MEQ/50ML IV SOLN
10.0000 meq | INTRAVENOUS | Status: AC
  Administered 2022-03-03 (×2): 10 meq via INTRAVENOUS
  Filled 2022-03-03 (×2): qty 50

## 2022-03-03 MED ORDER — ENSURE ENLIVE PO LIQD
237.0000 mL | Freq: Two times a day (BID) | ORAL | Status: DC
  Administered 2022-03-03 – 2022-03-09 (×10): 237 mL via ORAL

## 2022-03-03 MED ORDER — K PHOS MONO-SOD PHOS DI & MONO 155-852-130 MG PO TABS: 500.0000 mg | ORAL_TABLET | Freq: Four times a day (QID) | ORAL | Status: DC

## 2022-03-03 MED ORDER — POTASSIUM PHOSPHATES 15 MMOLE/5ML IV SOLN
20.0000 mmol | Freq: Once | INTRAVENOUS | Status: AC
  Administered 2022-03-03: 20 mmol via INTRAVENOUS
  Filled 2022-03-03: qty 6.67

## 2022-03-03 MED ORDER — TRAVASOL 10 % IV SOLN
INTRAVENOUS | Status: DC
  Filled 2022-03-03: qty 756

## 2022-03-03 NOTE — Progress Notes (Signed)
Nutrition Follow-up  DOCUMENTATION CODES:   Not applicable  INTERVENTION:  TPN per Pharmacy.   Provide Ensure Enlive po BID, each supplement provides 350 kcal and 20 grams of protein.  NUTRITION DIAGNOSIS:   Inadequate oral intake related to inability to eat as evidenced by NPO status; diet advanced; progressing  GOAL:   Patient will meet greater than or equal to 90% of their needs; met with TPN  MONITOR:   Diet advancement, Labs, Weight trends, Skin, I & O's  REASON FOR ASSESSMENT:   Consult Assessment of nutrition requirement/status  ASSESSMENT:   88 y.o. male with medical history significant of paroxysmal A-fib not on anticoagulation, hypertension, hypothyroidism, Parkinson's disease, lumbar spondylosis, type 2 diabetes, hyperlipidemia, PVD, CKD stage IIIb, remote history of colon cancer, BPH, pancreatic insufficiency, gallstones, GERD presents with SBO.  NGT discontinued this morning. Diet has been advanced to a full liquid diet. Meal completion has been 15% today. Pt currently has Boost Breeze and has been consuming them. RD to order Ensure in place of Boost Breeze to aid in caloric and protein needs now that diet has been advanced. Per Pharmacy, pt continues at goal rate of 70 ml/hr to provide 1743 kcal and 76 grams of protein. Plans to half TPN rate tomorrow with possible discontinuation.   Labs and medications reviewed. Phosphorous low at 2.4. Potassium phosphate given.   Diet Order:   Diet Order             Diet full liquid Room service appropriate? Yes; Fluid consistency: Thin  Diet effective now                   EDUCATION NEEDS:   Not appropriate for education at this time  Skin:  Skin Assessment: Reviewed RN Assessment Skin Integrity Issues:: Unstageable Stage II: perineum Unstageable: coccyx  Last BM:  6/15  Height:   Ht Readings from Last 1 Encounters:  02/22/22 _0  (1.727 m)    Weight:   Wt Readings from Last 1 Encounters:   03/03/22 77.5 kg   BMI:  Body mass index is 25.98 kg/m.  Estimated Nutritional Needs:   Kcal:  1700-1850  Protein:  75-85 grams  Fluid:  >/= 1.7 L/day  Corrin Parker, MS, RD, LDN RD pager number/after hours weekend pager number on Amion.

## 2022-03-03 NOTE — Progress Notes (Signed)
Mobility Specialist Progress Note:   03/03/22 1644  Mobility  Activity Ambulated with assistance in room;Transferred to/from BSC  Level of Assistance Moderate assist, patient does 50-74%  Assistive Device Front wheel walker  Distance Ambulated (ft) 2 ft  Activity Response Tolerated well  $Mobility charge 1 Mobility   Pt received on BSC needing to get back in the chair. No complaints of pain. ModA to stand. Left in chair with call bell in reach and all needs met.   Steven Ward Mobility Specialist  

## 2022-03-03 NOTE — Progress Notes (Addendum)
Inpatient Rehab Admissions Coordinator:   Continue to follow.  Note plans for d/c NGT today and advancing diet as tolerated.  I will f/u Monday and discuss insurance auth process at that time.   1540: Left message for pt's son to update on CIR.  Will continue to follow.   Shann Medal, PT, DPT Admissions Coordinator [phone] 03/03/22  11:50 AM

## 2022-03-03 NOTE — Progress Notes (Signed)
Physical Therapy Treatment Patient Details Name: Steven Ward MRN: [id number] DOB: 1925/08/06 Today's Date: 03/03/2022   History of Present Illness The pt is a 88 yo male presenting 6/7 with weakness, fatigue, poor appetite, and fall while walking to the bathroom on day of admission. Pt found to be in afib with frequent PVCs (new after ablation 20 years ago). CT revealed mid/distal SBO, NG tube placed 6/7. MRI revealed R cerebellar infarct. PMH includes: DM II, HLD, PVD, PD, PAF, CKD III, colon cancer, and BPH.    PT Comments    Pt is making great progress towards his goals today. Pt continues to be limited in safe mobility by decreased strength, balance and endurance, however is able to progress his mobility to 40 feet with RW and mod physical A. PT continues to recommend AIR level rehab at discharge. PT will continue to follow acutely.    Recommendations for follow up therapy are one component of a multi-disciplinary discharge planning process, led by the attending physician.  Recommendations may be updated based on patient status, additional functional criteria and insurance authorization.  Follow Up Recommendations  Acute inpatient rehab (3hours/day)     Assistance Recommended at Discharge Frequent or constant Supervision/Assistance  Patient can return home with the following Two people to help with walking and/or transfers;A lot of help with bathing/dressing/bathroom;Assistance with cooking/housework;Direct supervision/assist for medications management;Direct supervision/assist for financial management;Assist for transportation;Help with stairs or ramp for entrance   Equipment Recommendations   (defer to post acute)    Recommendations for Other Services Rehab consult     Precautions / Restrictions Precautions Precautions: Fall Restrictions Weight Bearing Restrictions: No     Mobility  Bed Mobility Overal bed mobility: Needs Assistance Bed Mobility: Sit to Supine      Supine to sit: Mod assist, HOB elevated     General bed mobility comments: modA for bringing hips around to EoB and bringing trunk to upright    Transfers Overall transfer level: Needs assistance Equipment used: Rolling walker (2 wheels), 1 person hand held assist Transfers: Sit to/from Stand, Bed to chair/wheelchair/BSC Sit to Stand: Mod assist   Step pivot transfers: Mod assist       General transfer comment: modA for sit to stand from bed for ambulation and then for step transfer from recliner to [facility]    Ambulation/Gait Ambulation/Gait assistance: Mod assist, +2 physical assistance Gait Distance (Feet): 40 Feet Assistive device: Rolling walker (2 wheels) Gait Pattern/deviations: Decreased step length - right, Decreased step length - left, Shuffle, Leaning posteriorly, Narrow base of support Gait velocity: slowed Gait velocity interpretation: <1.31 ft/sec, indicative of household ambulator   General Gait Details: modA for steadying with close chair follow pt able to ambulate with very small steps, vc for proximity to RW and increased step length    Modified Rankin (Stroke Patients Only) Modified Rankin (Stroke Patients Only) Pre-Morbid Rankin Score: Moderately severe disability Modified Rankin: Moderately severe disability     Balance Overall balance assessment: Needs assistance Sitting-balance support: Bilateral upper extremity supported, Feet supported Sitting balance-Leahy Scale: Fair     Standing balance support: Bilateral upper extremity supported, During functional activity Standing balance-Leahy Scale: Poor Standing balance comment: dependent on BUE support and assist, ;ess posterior lean today                            Cognition Arousal/Alertness: Awake/alert Behavior During Therapy: Flat affect Overall Cognitive Status: No family/caregiver present to determine  baseline cognitive functioning Area of Impairment: Following commands,  Safety/judgement, Awareness, Problem solving                       Following Commands: Follows multi-step commands with increased time Safety/Judgement: Decreased awareness of safety, Decreased awareness of deficits Awareness: Emergent Problem Solving: Slow processing, Decreased initiation, Difficulty sequencing, Requires verbal cues General Comments: continues to improved awareness and command follow           General Comments General comments (skin integrity, edema, etc.): VSS on RA      Pertinent Vitals/Pain  No denies pain      PT Goals (current goals can now be found in the care plan section) Acute Rehab PT Goals Patient Stated Goal: none stated PT Goal Formulation: Patient unable to participate in goal setting Time For Goal Achievement: 03/09/22 Potential to Achieve Goals: Good Progress towards PT goals: Progressing toward goals    Frequency    Min 4X/week      PT Plan Current plan remains appropriate       AM-PAC PT "6 Clicks" Mobility   Outcome Measure  Help needed turning from your back to your side while in a flat bed without using bedrails?: A Lot Help needed moving from lying on your back to sitting on the side of a flat bed without using bedrails?: A Lot Help needed moving to and from a bed to a chair (including a wheelchair)?: A Lot Help needed standing up from a chair using your arms (e.g., wheelchair or bedside chair)?: A Lot Help needed to walk in hospital room?: A Lot Help needed climbing 3-5 steps with a railing? : Total 6 Click Score: 11    End of Session Equipment Utilized During Treatment: Gait belt Activity Tolerance: Patient tolerated treatment well Patient left: Other (comment) (on [facility] RN notified) Nurse Communication: Mobility status;Other (comment) (pt on BSC) PT Visit Diagnosis: Other abnormalities of gait and mobility (R26.89);Muscle weakness (generalized) (M62.81);History of falling (Z91.81)     Time: 6314-9702 PT  Time Calculation (min) (ACUTE ONLY): 33 min  Charges:  $Gait Training: 8-22 mins $Therapeutic Activity: 8-22 mins                     Lynanne Delgreco B. Migdalia Dk PT, DPT Acute Rehabilitation Services Please use secure chat or  Call Office [phone]    [place] 03/03/2022, 4:50 PM

## 2022-03-03 NOTE — Progress Notes (Signed)
Speech Language Pathology Treatment: Cognitive-Linquistic  Patient Details Name: Steven Ward MRN: [id number] DOB: [birth year] Today's Date: 03/03/2022 Time: 1230-     Assessment / Plan / Recommendation Clinical Impression  Speech clarity appears to be improved from time of initial assessment, with sentence/conversational-level speech intelligibility improved to >90%.  Quality of phonation is clear; intensity WNL. No cueing was needed. Goals met for speech.   Pt may benefit from f/u cognitive-linguistic assessment while in inpatient rehab.   Notes mention awaiting SLP recommendations for PO diet - we have not been consulted to evaluate swallowing.  If concerns arise, we are certainly available for assessment.  It sounds as though he is tolerating his full liquid diet well, however.      HPI HPI: 88 yo male presenting 6/7 with weakness, fatigue, poor appetite, and fall while walking to the bathroom on day of admission. Pt found to be in afib with frequent PVCs (new after ablation 20 years ago). CT revealed mid/distal SBO, NG tube placed 6/7. MRI revealed R cerebellar infarct, possible subacute right frontal lobe CVA. PMH includes: DM II, HLD, PVD, PD, PAF, CKD III, colon cancer, and BPH.      SLP Plan  Continue with current plan of care      Recommendations for follow up therapy are one component of a multi-disciplinary discharge planning process, led by the attending physician.  Recommendations may be updated based on patient status, additional functional criteria and insurance authorization.    Recommendations                   SLP Visit Diagnosis: Dysarthria and anarthria (R47.1) Plan: Continue with current plan of care          Kelis Plasse L. Tivis Ringer, MA CCC/SLP Clinical Specialist - Acute Care SLP Acute Rehabilitation Services Office number [phone]  Juan Quam Laurice  03/03/2022, 4:09 PM

## 2022-03-03 NOTE — Progress Notes (Signed)
PROGRESS NOTE    SIGMOND PATALANO  MGQ:[id number] DOB: 08-17-1925 DOA: 02/22/2022 PCP: [name], MD  Chief Complaint  Patient presents with   Weakness   Fatigue    Brief Narrative:  DAIVD FREDERICKSEN is Bond Grieshop 88 y.o. male with medical history significant of paroxysmal [place] Basaldua-fib not on anticoagulation, hypertension, hypothyroidism, Parkinson's disease, lumbar spondylosis, type 2 diabetes, hyperlipidemia, PVD, CKD stage IIIb, remote history of colon cancer, BPH, pancreatic insufficiency, gallstones, GERD. Patient presented secondary to generalized weakness and found to have evidence of atrial fibrillation, SBO and acute infarct. General surgery consulted; NPO with NG tube placed. Cardiology consulted and patient's rate managed with beta blocker. Neurology consulted for stroke. SBO persistent and likely partial. PICC ordered for TPN starting 6/13.    Assessment & Plan:   Principal Problem:   Paroxysmal atrial fibrillation (HCC) Active Problems:   SBO (small bowel obstruction) (HCC)   Hypokalemia   Hypomagnesemia   Hypophosphatemia   Acute kidney injury superimposed on chronic kidney disease (HCC)   Metabolic acidosis   Acute CVA (cerebrovascular accident) ([place])   Generalized weakness   Essential hypertension   Pneumonia   T2DM (type 2 diabetes mellitus) ([place])   PD (Parkinson's disease) ([place])   Hypothyroidism   BPH (benign prostatic hyperplasia)   Decubitus ulcer   Compression fracture of body of thoracic vertebra (HCC)   Pleural plaque   Pulmonary fibrosis (HCC)   Right lower lobe lung mass   CKD stage 3 due to type 2 diabetes mellitus (HCC)   Nausea & vomiting   Stage 3b chronic kidney disease (CKD) (HCC)   Assessment and Plan: * Paroxysmal atrial fibrillation ([place]) Rate controlled. Cardiology consulted. Transthoracic Echocardiogram ordered and significant for severely dilated left atrium with normal LVEF. Anticoagulation deferred secondary to acute infarct, per  cardiology. Started on metoprolol PO -Cardiology recommendations: Metoprolol 12.5 mg BID -eliquis  SBO (small bowel obstruction) ([place]) KUB 6/13 with contrast throughout colon Improvement in small bowel gas pattern, few persistent gas filled dilated loops NGT removed, full liquids, ADAT per surgery  Continue PICC/TNA for now -> d/c tmrw if doing well per surgery Appreciate assistance  Hypokalemia Will follow, supplement as needed Goal >4   Hypophosphatemia Replace and follow  Hypomagnesemia Goal >2 Follow, supplement prn  Acute kidney injury superimposed on chronic kidney disease ([place]) Baseline appears to be around 2.2? Improved with IVF Peaked around 2.4ish Currently lower than baseline, trend  Metabolic acidosis Mild, follow  Acute CVA (cerebrovascular accident) ([place]) Acute punctate infarct noted on MRI located within the right cerebellar hemisphere in addition to possible subacute infarct of right frontal lobe. LDL of 34. Hemoglobin A1C of 7.7%. Transthoracic Echocardiogram with normal LVEF of 55-60% with no evidence of atrial level shunt. -Neurology recommendations: Aspirin 81 mg, Eliquis. Outpatient follow-up. Signed off. -PT/OT recommending CIR -SLP following    Generalized weakness CIR recommended by therapy  Essential hypertension On thiazide and loop outpatient - hold Continue metoprolol (started inpatient)  Pneumonia S/p treatment with ceftriaxone/flagyl for pneumonia  T2DM (type 2 diabetes mellitus) ([place]) SSI A1c 7.9 Insulin per pharmacy  PD (Parkinson's disease) ([place]) sinemet  Hypothyroidism synthroid  BPH (benign prostatic hyperplasia) flomax  Right lower lobe lung mass PCP and/or pulm follow up outpatient Needs repeat imaging within 3 months  Pulmonary fibrosis ([place]) pulm f/u outpatient  Pleural plaque Follow with pulm outpatient  Compression fracture of body of thoracic vertebra (HCC) T5 and T12, noted, follow  Decubitus  ulcer Pressure Injury 02/25/22 Coccyx  Unstageable - Full thickness tissue loss in which the base of the injury is covered by slough (yellow, tan, gray, green or brown) and/or eschar (tan, brown or black) in the wound bed. (Active)  02/25/22 2100  Location: Coccyx  Location Orientation:   Staging: Unstageable - Full thickness tissue loss in which the base of the injury is covered by slough (yellow, tan, gray, green or brown) and/or eschar (tan, brown or black) in the wound bed.  Wound Description (Comments):   Present on Admission: Yes          DVT prophylaxis: eliquis Code Status: dnr Family Communication: none Disposition:   Status is: Inpatient Remains inpatient appropriate because: pending further improvement   Consultants:  Surgery Cards neuro  Procedures:  Carotid US Summary:  Right Carotid: Velocities in the right ICA are consistent with Cimone Fahey 1-39%  stenosis.   Left Carotid: Velocities in the left ICA are consistent with Brodey Bonn 1-39%  stenosis.   Vertebrals:  Bilateral vertebral arteries demonstrate antegrade flow.  Subclavians: Normal flow hemodynamics were seen in bilateral subclavian               arteries.   Echo IMPRESSIONS     1. Left ventricular ejection fraction, by estimation, is 55 to 60%. The  left ventricle has normal function. The left ventricle has no regional  wall motion abnormalities. There is mild concentric left ventricular  hypertrophy. Left ventricular diastolic  function could not be evaluated.   2. Right ventricular systolic function is normal. The right ventricular  size is normal. There is normal pulmonary artery systolic pressure.   3. Left atrial size was severely dilated.   4. The mitral valve is normal in structure. Trivial mitral valve  regurgitation. No evidence of mitral stenosis.   5. The aortic valve is calcified. There is mild calcification of the  aortic valve. There is mild thickening of the aortic valve. Aortic valve   regurgitation is not visualized. No aortic stenosis is present.   6. The inferior vena cava is normal in size with greater than 50%  respiratory variability, suggesting right atrial pressure of 3 mmHg.   Antimicrobials:  Anti-infectives (From admission, onward)    Start     Dose/Rate Route Frequency Ordered Stop   02/23/22 0000  cefTRIAXone (ROCEPHIN) 2 g in sodium chloride 0.9 % 100 mL IVPB  Status:  Discontinued        2 g 200 mL/hr over 30 Minutes Intravenous Daily at 10 pm 02/22/22 2346 02/28/22 1142   02/23/22 0000  metroNIDAZOLE (FLAGYL) IVPB 500 mg  Status:  Discontinued        500 mg 100 mL/hr over 60 Minutes Intravenous 2 times daily 02/22/22 2346 02/23/22 1802       Subjective: No complaints Asking for bedpan  Objective: Vitals:   03/03/22 0100 03/03/22 0508 03/03/22 0717 03/03/22 1129  BP:  (!) 111/44  128/60  Pulse:  62 61 61  Resp:  (!) 21  16  Temp:  (!) 97.3 F (36.3 C) 98.1 F (36.7 C)   TempSrc:  Oral Oral   SpO2:  100%  99%  Weight: 77.5 kg     Height:        Intake/Output Summary (Last 24 hours) at 03/03/2022 1702 Last data filed at 03/03/2022 1326 Gross per 24 hour  Intake 516.72 ml  Output 700 ml  Net -183.28 ml   Filed Weights   03/01/22 0456 03/02/22 0428 03/03/22 0100  Weight: 75.4 kg  77.2 kg 77.5 kg    Examination:  General: No acute distress. Cardiovascular: RRR Lungs: unlabored Abdomen: Soft, nontender, nondistended Neurological: Alert and oriented 3. Moves all extremities 4 with equal strength. Cranial nerves II through XII grossly intact. Extremities: No clubbing or cyanosis. No edema.   Data Reviewed: I have personally reviewed following labs and imaging studies  CBC: Recent Labs  Lab 02/27/22 0210 03/01/22 0420 03/02/22 0530 03/03/22 0314  WBC 10.6* 10.4 10.4 11.8*  NEUTROABS  --   --  8.4* 9.7*  HGB 11.0* 10.2* 9.6* 9.8*  HCT 32.8* 29.9* 28.0* 28.9*  MCV 97.9 97.7 96.2 97.0  PLT 131* 109* 102* 104*    Basic  Metabolic Panel: Recent Labs  Lab 02/27/22 0210 02/28/22 0250 03/01/22 0420 03/02/22 0530 03/03/22 0314  NA 140 140 137 137 137  K 3.4* 3.6 3.5 3.9 3.7  CL 115* 110 111 109 109  CO2 18* 18* 19* 21* 22  GLUCOSE 130* 102* 185* 240* 82  BUN 56* 51* 45* 38* 41*  CREATININE 1.99* 1.96* 1.87* 1.59* 1.61*  CALCIUM 7.6* 8.0* 7.7* 7.8* 8.0*  MG 1.8 2.1 1.9 1.9 2.2  PHOS  --   --  2.5 1.8* 2.4*    GFR: Estimated Creatinine Clearance: 26.6 mL/min (Norine Reddington) (by C-G formula based on SCr of 1.61 mg/dL (H)).  Liver Function Tests: Recent Labs  Lab 03/01/22 0420 03/02/22 0530 03/03/22 0314  AST '16 17 24  '$ ALT 5 6 <5  ALKPHOS 45 47 50  BILITOT 0.8 0.6 0.7  PROT 4.6* 4.4* 4.8*  ALBUMIN 1.8* 1.7* 1.8*    CBG: Recent Labs  Lab 03/02/22 1956 03/03/22 0045 03/03/22 0519 03/03/22 0747 03/03/22 1053  GLUCAP 137* 80 90 107* 157*     No results found for this or any previous visit (from the past 240 hour(s)).       Radiology Studies: No results found.      Scheduled Meds:   stroke: early stages of recovery book   Does not apply Once   apixaban  2.5 mg Oral BID   aspirin  81 mg Oral Daily   Carbidopa-Levodopa ER  1 tablet Oral QPM   Carbidopa-Levodopa ER  2 tablet Oral BID   Chlorhexidine Gluconate Cloth  6 each Topical Daily   feeding supplement  237 mL Oral BID BM   insulin aspart  0-15 Units Subcutaneous Q4H   leptospermum manuka honey  1 application  Topical Daily   levothyroxine  25 mcg Oral Daily   metoprolol tartrate  12.5 mg Oral BID   sodium chloride flush  10-40 mL Intracatheter Q12H   tamsulosin  0.4 mg Oral Daily   Continuous Infusions:  TPN ADULT (ION) 70 mL/hr at 03/03/22 0116   TPN ADULT (ION)       LOS: 8 days    Time spent: over 30 min    Fayrene Helper, MD [facility]   To contact the attending provider between 7A-7P or the covering provider during after hours 7P-7A, please log into the web site [email or web address] and access using  universal  password for that web site. If you do not have the password, please call the hospital operator.  03/03/2022, 5:02 PM

## 2022-03-03 NOTE — Plan of Care (Signed)
  Problem: Education: Goal: Ability to describe self-care measures that may prevent or decrease complications (Diabetes Survival Skills Education) will improve Outcome: Progressing   Problem: Coping: Goal: Ability to adjust to condition or change in health will improve Outcome: Progressing   Problem: Skin Integrity: Goal: Risk for impaired skin integrity will decrease Outcome: Progressing   Problem: Activity: Goal: Risk for activity intolerance will decrease Outcome: Progressing   Problem: Nutrition: Goal: Adequate nutrition will be maintained Outcome: Progressing

## 2022-03-03 NOTE — Progress Notes (Addendum)
PHARMACY - TOTAL PARENTERAL NUTRITION CONSULT NOTE  Indication: Small bowel obstruction with ileus  Patient Measurements: Height: '5\' 8"'$  (172.7 cm) Weight: 77.5 kg (170 lb 13.7 oz) IBW/kg (Calculated) : 68.4 TPN AdjBW (KG): 65.3 Body mass index is 25.98 kg/m.  Assessment:  88 y.o. male with medical history significant of PAF not on AC, HTN, hypothyroidism, Parkinson's, lumbar spondylosis, T2DM, HLD, PVD, CKD3b, remote history of colon cancer, BPH, pancreatic insufficiency, gallstones, GERD presents with partial SBO. Managing conservatively for now. Pharmacy consulted to manage TPN.  Glucose / Insulin: hx DM on glipizide PTA, A1c 7.9%. CBGs tightly controlled after the addition of insulin to TPN and Levemir initiation Used 23 units SSI and an additional 9 units Novolog, 15 units insulin in TPN, Levemir 5 units BID Electrolytes: K down to 3.7 and Phos up to 2.4 post KPhos 27mol (goal K >/= 4), Mag 2.2 post 2gm IV, others WNL Renal: SCr up 1.61, BUN 41 Hepatic: LFTs / tbili / TG WNL, albumin 1.8 Intake / Output; MIVF: UOP 0.3 ml/kg/hr, NGT clamped, LBM 6/15 GI Imaging: 6/12 - Ileus pattern without significant change 6/13 - improvement of SB gas pattern, few persistent gas-filled dilated loops GI Surgeries / Procedures:  None to date  Central access: PICC placed 02/28/22 TPN start date: 02/28/22  Nutritional Goals:  RD Estimated Needs Total Energy Estimated Needs: 1700-1850 Total Protein Estimated Needs: 75-85 grams Total Fluid Estimated Needs: >/= 1.7 L/day  Current Nutrition:  TPN CLD started 6/15 Boost Breeze BID - 1 charted given yesterday  Plan:  Continue TPN at goal rate of 70 mL/hr to provide 76g AA, 286g CHO and 47g ILE for a total of 1743 kCal, meeting 100% of needs Electrolytes in TPN: Na 771m/L, increase K further to 5078mL, Ca 5mE46m, increase Mg 5mEq8mon 6/15, increase Phos further to 15mmo89m Cl:Ac 1:2 Add standard MVI and trace elements to TPN Continue  moderate SSI Q4H and reduce regular insulin in TPN to 10 units D/C scheduled Levemir per discussion with MD Repeat KPhos 20mmol88mand KCL x 2 runs F/u PO intake/diet advancement, AM labs, NGT removal  Ceonna Frazzini D. Adriano Bischof, PMina MarbleD, BCPS, BCCCP [id number], 7:41 AM  =============================  Addendum: Advance to FLD PhaQulincy may half TPN on 6/17; possible discontinuation as well per Surgery  Zanasia Hickson D. Issiah Huffaker, PMina MarbleD, BCPS, BCCCP [id number], 9:30 AM

## 2022-03-03 NOTE — Progress Notes (Addendum)
Patient ID: Steven Ward, male   DOB: August 13, 1925, 88 y.o.   MRN: [id number] [facility] Progress Note     Subjective: Continues to have BMs and is tolerating his clear liquids well.  No nausea or vomiting.  No residual this am.  Objective: Vital signs in last 24 hours: Temp:  [97.3 F (36.3 C)-99.2 F (37.3 C)] 98.1 F (36.7 C) (06/16 0717) Pulse Rate:  [61-80] 61 (06/16 0717) Resp:  [18-21] 21 (06/16 0508) BP: (110-125)/(44-53) 111/44 (06/16 0508) SpO2:  [90 %-100 %] 100 % (06/16 0508) Weight:  [77.5 kg] 77.5 kg (06/16 0100) Last BM Date : 03/02/22  Intake/Output from previous day: 06/15 0701 - 06/16 0700 In: 634.7 [P.O.:118; I.V.:516.7] Out: 525 [Urine:525] Intake/Output this shift: No intake/output data recorded.  PE: Gen:  Alert, NAD, pleasant Abd: soft, ND, +BS, NGT with no residual currently, NT  Lab Results:  Recent Labs    03/02/22 0530 03/03/22 0314  WBC 10.4 11.8*  HGB 9.6* 9.8*  HCT 28.0* 28.9*  PLT 102* 104*   BMET Recent Labs    03/02/22 0530 03/03/22 0314  NA 137 137  K 3.9 3.7  CL 109 109  CO2 21* 22  GLUCOSE 240* 82  BUN 38* 41*  CREATININE 1.59* 1.61*  CALCIUM 7.8* 8.0*   PT/INR No results for input(s): "LABPROT", "INR" in the last 72 hours. CMP     Component Value Date/Time   NA 137 03/03/2022 0314   K 3.7 03/03/2022 0314   CL 109 03/03/2022 0314   CO2 22 03/03/2022 0314   GLUCOSE 82 03/03/2022 0314   BUN 41 (H) 03/03/2022 0314   CREATININE 1.61 (H) 03/03/2022 0314   CALCIUM 8.0 (L) 03/03/2022 0314   PROT 4.8 (L) 03/03/2022 0314   ALBUMIN 1.8 (L) 03/03/2022 0314   AST 24 03/03/2022 0314   ALT <5 03/03/2022 0314   ALKPHOS 50 03/03/2022 0314   BILITOT 0.7 03/03/2022 0314   GFRNONAA 39 (L) 03/03/2022 0314   GFRAA  04/26/2009 0920    >60        The eGFR has been calculated using the MDRD equation. This calculation has not been validated in all clinical situations. eGFR's persistently <60 mL/min  signify possible Chronic Kidney Disease.   Lipase  No results found for: "LIPASE"     Studies/Results: No results found.  Anti-infectives: Anti-infectives (From admission, onward)    Start     Dose/Rate Route Frequency Ordered Stop   02/23/22 0000  cefTRIAXone (ROCEPHIN) 2 g in sodium chloride 0.9 % 100 mL IVPB  Status:  Discontinued        2 g 200 mL/hr over 30 Minutes Intravenous Daily at 10 pm 02/22/22 2346 02/28/22 1142   02/23/22 0000  metroNIDAZOLE (FLAGYL) IVPB 500 mg  Status:  Discontinued        500 mg 100 mL/hr over 60 Minutes Intravenous 2 times daily 02/22/22 2346 02/23/22 1802        Assessment/Plan Partial SBO -DC NGT today -adv to full liquids -add protein shakes today -can half TNA today and plan to DC tomorrow if continues to tolerate his diet   FEN - FLD/Breeze/TNA VTE - eliquis ID - rocephin 6/8>> for CAP   New CVA this admit DM HTN Pancreatic insufficiency A fib on eliquis  Parkinson's disease Hypothyroidism BPH Pulmonary fibrosis/RLL mass   I reviewed hospitalist notes, last 24 h vitals and pain scores, last 48 h intake and output, last 24 h labs and  trends, and last 24 h imaging results.     LOS: 8 days    [name], [facility] 03/03/2022, 9:04 AM Please see Amion for pager number during day hours 7:00am-4:30pm

## 2022-03-04 DIAGNOSIS — I480 Paroxysmal atrial fibrillation: Secondary | ICD-10-CM | POA: Diagnosis not present

## 2022-03-04 LAB — MAGNESIUM: Magnesium: 2 mg/dL (ref 1.7–2.4)

## 2022-03-04 LAB — GLUCOSE, CAPILLARY
Glucose-Capillary: 143 mg/dL — ABNORMAL HIGH (ref 70–99)
Glucose-Capillary: 144 mg/dL — ABNORMAL HIGH (ref 70–99)
Glucose-Capillary: 150 mg/dL — ABNORMAL HIGH (ref 70–99)
Glucose-Capillary: 161 mg/dL — ABNORMAL HIGH (ref 70–99)
Glucose-Capillary: 196 mg/dL — ABNORMAL HIGH (ref 70–99)
Glucose-Capillary: 228 mg/dL — ABNORMAL HIGH (ref 70–99)

## 2022-03-04 LAB — BASIC METABOLIC PANEL
Anion gap: 10 (ref 5–15)
BUN: 41 mg/dL — ABNORMAL HIGH (ref 8–23)
CO2: 18 mmol/L — ABNORMAL LOW (ref 22–32)
Calcium: 7.8 mg/dL — ABNORMAL LOW (ref 8.9–10.3)
Chloride: 104 mmol/L (ref 98–111)
Creatinine, Ser: 1.5 mg/dL — ABNORMAL HIGH (ref 0.61–1.24)
GFR, Estimated: 43 mL/min — ABNORMAL LOW (ref >60)
Glucose, Bld: 215 mg/dL — ABNORMAL HIGH (ref 70–99)
Potassium: 4.2 mmol/L (ref 3.5–5.1)
Sodium: 132 mmol/L — ABNORMAL LOW (ref 135–145)

## 2022-03-04 LAB — PHOSPHORUS: Phosphorus: 3.7 mg/dL (ref 2.5–4.6)

## 2022-03-04 MED ORDER — INSULIN ASPART 100 UNIT/ML IJ SOLN
2.0000 [IU] | Freq: Three times a day (TID) | INTRAMUSCULAR | Status: DC
  Administered 2022-03-04 – 2022-03-09 (×14): 2 [IU] via SUBCUTANEOUS

## 2022-03-04 MED ORDER — INSULIN ASPART 100 UNIT/ML IJ SOLN
0.0000 [IU] | Freq: Three times a day (TID) | INTRAMUSCULAR | Status: DC
  Administered 2022-03-04: 2 [IU] via SUBCUTANEOUS
  Administered 2022-03-04: 3 [IU] via SUBCUTANEOUS
  Administered 2022-03-04: 2 [IU] via SUBCUTANEOUS
  Administered 2022-03-05 (×3): 3 [IU] via SUBCUTANEOUS
  Administered 2022-03-06: 2 [IU] via SUBCUTANEOUS
  Administered 2022-03-06: 3 [IU] via SUBCUTANEOUS
  Administered 2022-03-07 (×2): 2 [IU] via SUBCUTANEOUS
  Administered 2022-03-07: 3 [IU] via SUBCUTANEOUS
  Administered 2022-03-08: 2 [IU] via SUBCUTANEOUS
  Administered 2022-03-09: 3 [IU] via SUBCUTANEOUS
  Administered 2022-03-09: 2 [IU] via SUBCUTANEOUS

## 2022-03-04 MED ORDER — INSULIN ASPART 100 UNIT/ML IJ SOLN: 0.0000 [IU] | Freq: Every day | INTRAMUSCULAR | Status: DC

## 2022-03-04 NOTE — Progress Notes (Signed)
PHARMACY - TOTAL PARENTERAL NUTRITION CONSULT NOTE  Indication: Small bowel obstruction with ileus  Patient Measurements: Height: '5\' 8"'$  (172.7 cm) Weight: 79.9 kg (176 lb 2.4 oz) IBW/kg (Calculated) : 68.4 TPN AdjBW (KG): 65.3 Body mass index is 26.78 kg/m.  Assessment:  88 y.o. male with partial SBO. Managing conservatively for now. Pharmacy consulted to manage TPN.  Glucose / Insulin: hx DM on glipizide PTA, A1c 7.9%. BG 161- 228, used 17 units insulin and 10 units in TPN  Electrolytes: Na 132, K 4.2 (got 35 mEq IV), Co2 18, CoCa 10.55, phos 3.7 (got 20 mmol), others wnl Renal: SCr 1.5, BUN 41 Hepatic: LFTs / tbili / TG WNL, albumin 1.8 Intake / Output; MIVF: UOP 1.1 ml/kg/hr, NGT out, LBM 6/16 GI Imaging: 6/7 CT abd: mechanical SBO, possible internal hernia, chronic pancreatitis  6/8 KUB- worsening and persistent SBO   6/9 KUB- distal SBO   6/10 KUB- partial SBO   6/12 KUB- Ileus pattern without significant change 6/13 KUB- improvement of SB gas pattern, few persistent gas-filled dilated loops GI Surgeries / Procedures:  None to date  Central access: PICC placed 02/28/22 TPN start date: 02/28/22  Nutritional Goals:  RD Estimated Needs Total Energy Estimated Needs: 1700-1850 Total Protein Estimated Needs: 75-85 grams Total Fluid Estimated Needs: >/= 1.7 L/day  Current Nutrition:  TPN 6/15 CLD, Boost Breeze BID   6/16 FLD - eating 15-25% of meals  6/17 FLD- tolerated grits, ice cream and coffee for breakfast   Plan:  Stop TPN per team, RN instructed to decrease to half rate for 1 hour before discontinuing     Benetta Spar, PharmD, BCPS, BCCP Clinical Pharmacist  Please check AMION for all [place] phone numbers After 10:00 PM, call [place]

## 2022-03-04 NOTE — Progress Notes (Signed)
PROGRESS NOTE    Steven Ward  GUR:[id number] DOB: March 18, 1925 DOA: 02/22/2022 PCP: [name], MD  Chief Complaint  Patient presents with   Weakness   Fatigue    Brief Narrative:  Steven Ward is Steven Ward 88 y.o. male with medical history significant of paroxysmal [name]-fib not on anticoagulation, hypertension, hypothyroidism, Parkinson's disease, lumbar spondylosis, type 2 diabetes, hyperlipidemia, PVD, CKD stage IIIb, remote history of colon cancer, BPH, pancreatic insufficiency, gallstones, GERD. Patient presented secondary to generalized weakness and found to have evidence of atrial fibrillation, SBO and acute infarct. General surgery consulted; NPO with NG tube placed. Cardiology consulted and patient's rate managed with beta blocker. Neurology consulted for stroke. SBO persistent and likely partial. PICC ordered for TPN starting 6/13.    Assessment & Plan:   Principal Problem:   Paroxysmal atrial fibrillation (HCC) Active Problems:   SBO (small bowel obstruction) (HCC)   Hypokalemia   Hypomagnesemia   Hypophosphatemia   Acute kidney injury superimposed on chronic kidney disease (HCC)   Metabolic acidosis   Acute CVA (cerebrovascular accident) ([place])   Generalized weakness   Essential hypertension   Pneumonia   T2DM (type 2 diabetes mellitus) ([place])   PD (Parkinson's disease) ([place])   Hypothyroidism   BPH (benign prostatic hyperplasia)   Decubitus ulcer   Compression fracture of body of thoracic vertebra (HCC)   Pleural plaque   Pulmonary fibrosis (HCC)   Right lower lobe lung mass   CKD stage 3 due to type 2 diabetes mellitus (HCC)   Nausea & vomiting   Stage 3b chronic kidney disease (CKD) (HCC)   Assessment and Plan: * Paroxysmal atrial fibrillation ([place]) Rate controlled. Cardiology consulted. Transthoracic Echocardiogram ordered and significant for severely dilated left atrium with normal LVEF. Anticoagulation deferred secondary to acute infarct, per  cardiology. Started on metoprolol PO -Cardiology recommendations: Metoprolol 12.5 mg BID -eliquis  SBO (small bowel obstruction) ([place]) KUB 6/13 with contrast throughout colon Improvement in small bowel gas pattern, few persistent gas filled dilated loops NGT removed, regular diet, ADAT per surgery  Continue PICC/TNA for now -> d/c TPN today Appreciate assistance  Hypokalemia Will follow, supplement as needed Goal >4   Hypophosphatemia Replace and follow  Hypomagnesemia Goal >2 Follow, supplement prn  Acute kidney injury superimposed on chronic kidney disease ([place]) Baseline appears to be around 2.2? Improved with IVF Peaked around 2.4ish Currently lower than baseline, trend  Metabolic acidosis Mild, follow  Acute CVA (cerebrovascular accident) ([place]) Acute punctate infarct noted on MRI located within the right cerebellar hemisphere in addition to possible subacute infarct of right frontal lobe. LDL of 34. Hemoglobin A1C of 7.7%. Transthoracic Echocardiogram with normal LVEF of 55-60% with no evidence of atrial level shunt. -Neurology recommendations: Aspirin 81 mg, Eliquis. Outpatient follow-up. Signed off. -PT/OT recommending CIR -SLP following    Generalized weakness CIR recommended by therapy  Essential hypertension On thiazide and loop outpatient - hold Continue metoprolol (started inpatient)  Pneumonia S/p treatment with ceftriaxone/flagyl for pneumonia  T2DM (type 2 diabetes mellitus) ([place]) SSI A1c 7.9 Insulin per pharmacy  PD (Parkinson's disease) ([place]) sinemet  Hypothyroidism synthroid  BPH (benign prostatic hyperplasia) flomax  Right lower lobe lung mass PCP and/or pulm follow up outpatient Needs repeat imaging within 3 months  Pulmonary fibrosis ([place]) pulm f/u outpatient  Pleural plaque Follow with pulm outpatient  Compression fracture of body of thoracic vertebra (HCC) T5 and T12, noted, follow  Decubitus ulcer Pressure Injury  02/25/22 Coccyx Unstageable - Full thickness  tissue loss in which the base of the injury is covered by slough (yellow, tan, gray, green or brown) and/or eschar (tan, brown or black) in the wound bed. (Active)  02/25/22 2100  Location: Coccyx  Location Orientation:   Staging: Unstageable - Full thickness tissue loss in which the base of the injury is covered by slough (yellow, tan, gray, green or brown) and/or eschar (tan, brown or black) in the wound bed.  Wound Description (Comments):   Present on Admission: Yes          DVT prophylaxis: eliquis Code Status: dnr Family Communication: none Disposition:   Status is: Inpatient Remains inpatient appropriate because: pending further improvement   Consultants:  Surgery Cards neuro  Procedures:  Carotid US Summary:  Right Carotid: Velocities in the right ICA are consistent with Tacha Manni 1-39%  stenosis.   Left Carotid: Velocities in the left ICA are consistent with Marvene Strohm 1-39%  stenosis.   Vertebrals:  Bilateral vertebral arteries demonstrate antegrade flow.  Subclavians: Normal flow hemodynamics were seen in bilateral subclavian               arteries.   Echo IMPRESSIONS     1. Left ventricular ejection fraction, by estimation, is 55 to 60%. The  left ventricle has normal function. The left ventricle has no regional  wall motion abnormalities. There is mild concentric left ventricular  hypertrophy. Left ventricular diastolic  function could not be evaluated.   2. Right ventricular systolic function is normal. The right ventricular  size is normal. There is normal pulmonary artery systolic pressure.   3. Left atrial size was severely dilated.   4. The mitral valve is normal in structure. Trivial mitral valve  regurgitation. No evidence of mitral stenosis.   5. The aortic valve is calcified. There is mild calcification of the  aortic valve. There is mild thickening of the aortic valve. Aortic valve  regurgitation is not  visualized. No aortic stenosis is present.   6. The inferior vena cava is normal in size with greater than 50%  respiratory variability, suggesting right atrial pressure of 3 mmHg.   Antimicrobials:  Anti-infectives (From admission, onward)    Start     Dose/Rate Route Frequency Ordered Stop   02/23/22 0000  cefTRIAXone (ROCEPHIN) 2 g in sodium chloride 0.9 % 100 mL IVPB  Status:  Discontinued        2 g 200 mL/hr over 30 Minutes Intravenous Daily at 10 pm 02/22/22 2346 02/28/22 1142   02/23/22 0000  metroNIDAZOLE (FLAGYL) IVPB 500 mg  Status:  Discontinued        500 mg 100 mL/hr over 60 Minutes Intravenous 2 times daily 02/22/22 2346 02/23/22 1802       Subjective: No new complaints  Objective: Vitals:   03/03/22 2000 03/04/22 0328 03/04/22 0807 03/04/22 1406  BP: (!) 115/49 (!) 100/57 (!) 133/49   Pulse: 67 62 77   Resp: (!) 21 18 (!) 23   Temp: 98.4 F (36.9 C) 98.4 F (36.9 C) 98.3 F (36.8 C) 98.4 F (36.9 C)  TempSrc: Oral Oral Oral Oral  SpO2: 99% 98% 99%   Weight:  79.9 kg    Height:        Intake/Output Summary (Last 24 hours) at 03/04/2022 1600 Last data filed at 03/04/2022 1300 Gross per 24 hour  Intake 1905.48 ml  Output 1650 ml  Net 255.48 ml   Filed Weights   03/02/22 0428 03/03/22 0100 03/04/22 0328  Weight:  77.2 kg 77.5 kg 79.9 kg    Examination:  General: No acute distress. Cardiovascular: RRR Lungs: unlabored Abdomen: Soft, nontender, nondistended  Neurological: Alert and oriented 3. Moves all extremities 4 with equal strength. Cranial nerves II through XII grossly intact. Extremities: No clubbing or cyanosis. No edema.  Data Reviewed: I have personally reviewed following labs and imaging studies  CBC: Recent Labs  Lab 02/27/22 0210 03/01/22 0420 03/02/22 0530 03/03/22 0314  WBC 10.6* 10.4 10.4 11.8*  NEUTROABS  --   --  8.4* 9.7*  HGB 11.0* 10.2* 9.6* 9.8*  HCT 32.8* 29.9* 28.0* 28.9*  MCV 97.9 97.7 96.2 97.0  PLT 131* 109*  102* 104*    Basic Metabolic Panel: Recent Labs  Lab 02/28/22 0250 03/01/22 0420 03/02/22 0530 03/03/22 0314 03/04/22 0224  NA 140 137 137 137 132*  K 3.6 3.5 3.9 3.7 4.2  CL 110 111 109 109 104  CO2 18* 19* 21* 22 18*  GLUCOSE 102* 185* 240* 82 215*  BUN 51* 45* 38* 41* 41*  CREATININE 1.96* 1.87* 1.59* 1.61* 1.50*  CALCIUM 8.0* 7.7* 7.8* 8.0* 7.8*  MG 2.1 1.9 1.9 2.2 2.0  PHOS  --  2.5 1.8* 2.4* 3.7    GFR: Estimated Creatinine Clearance: 28.5 mL/min (Enijah Furr) (by C-G formula based on SCr of 1.5 mg/dL (H)).  Liver Function Tests: Recent Labs  Lab 03/01/22 0420 03/02/22 0530 03/03/22 0314  AST '16 17 24  '$ ALT 5 6 <5  ALKPHOS 45 47 50  BILITOT 0.8 0.6 0.7  PROT 4.6* 4.4* 4.8*  ALBUMIN 1.8* 1.7* 1.8*    CBG: Recent Labs  Lab 03/03/22 2000 03/04/22 0013 03/04/22 0419 03/04/22 0818 03/04/22 1135  GLUCAP 181* 161* 228* 150* 143*     No results found for this or any previous visit (from the past 240 hour(s)).       Radiology Studies: No results found.      Scheduled Meds:   stroke: early stages of recovery book   Does not apply Once   apixaban  2.5 mg Oral BID   aspirin  81 mg Oral Daily   Carbidopa-Levodopa ER  1 tablet Oral QPM   Carbidopa-Levodopa ER  2 tablet Oral BID   Chlorhexidine Gluconate Cloth  6 each Topical Daily   feeding supplement  237 mL Oral BID BM   insulin aspart  0-15 Units Subcutaneous TID WC   insulin aspart  0-5 Units Subcutaneous QHS   insulin aspart  2 Units Subcutaneous TID WC   leptospermum manuka honey  1 application  Topical Daily   levothyroxine  25 mcg Oral Daily   metoprolol tartrate  12.5 mg Oral BID   sodium chloride flush  10-40 mL Intracatheter Q12H   tamsulosin  0.4 mg Oral Daily   Continuous Infusions:     LOS: 9 days    Time spent: over 30 min    Fayrene Helper, MD [facility]   To contact the attending provider between 7A-7P or the covering provider during after hours 7P-7A, please log  into the web site [email or web address] and access using universal [place] password for that web site. If you do not have the password, please call the hospital operator.  03/04/2022, 4:00 PM

## 2022-03-04 NOTE — Evaluation (Signed)
Clinical/Bedside Swallow Evaluation Patient Details  Name: Steven Ward MRN: [id number] Date of Birth: [birth year]  Today's Date: 03/04/2022 Time: SLP Start Time (ACUTE ONLY): 0930 SLP Stop Time (ACUTE ONLY): 0945 SLP Time Calculation (min) (ACUTE ONLY): 15 min  Past Medical History:  Past Medical History:  Diagnosis Date   ABNORMAL THYROID FUNCTION TESTS 01/17/2010   ALLERGIC RHINITIS 12/01/2008   Colon cancer ([place])    COLONIC POLYPS, HX OF 12/01/2008   DIABETES MELLITUS, TYPE II 12/01/2008   EDEMA LEG 05/04/2009   ESSENTIAL HYPERTENSION 12/01/2008   Exocrine pancreatic insufficiency    GALLSTONES 04/07/2009   GERD 12/01/2008   Hay fever    SPONDYLOSIS, LUMBAR 12/01/2008   Past Surgical History:  Past Surgical History:  Procedure Laterality Date   ANKLE SURGERY Right    CATARACT EXTRACTION     CHOLECYSTECTOMY     COLON SURGERY     resection 1990 for cancer   LEG SURGERY Left    HPI:  88 yo male presenting 6/7 with weakness, fatigue, poor appetite, and fall while walking to the bathroom on day of admission. Pt found to be in afib with frequent PVCs (new after ablation 20 years ago). CT revealed mid/distal SBO, NG tube placed 6/7. MRI revealed R cerebellar infarct, possible subacute right frontal lobe CVA. PMH includes: DM II, HLD, PVD, PD, PAF, CKD III, colon cancer, and BPH.    Assessment / Plan / Recommendation  Clinical Impression  Patient is not currently presenting with clinical s/s of dysphagia as per this bedside/clinical swallow evaluation. Patient only reported some difficulty with swallowing pills but otherwise he denies any problems with his swallow function. He is edentulous and although he does have dentures here in hospital, he reported he did not need to wear them. SLP observed patient with PO intake of regular solids and thin liquids. No overt s/s aspiration or penetration observed and even without dentition, patient able to effectively masticate graham cracker without  significant difficulty or delay. SLP is recommending regular texture solids, thin liquids. (surgical MD in room just prior to SLP and she reported plan to upgrade him from full liquids to solids). No follow up recommended at this time from SLP for dysphagia. SLP Visit Diagnosis: Dysphagia, unspecified (R13.10)    Aspiration Risk  No limitations    Diet Recommendation Regular;Thin liquid   Liquid Administration via: Cup;Straw Medication Administration: Whole meds with liquid Supervision: Patient able to self feed Compensations: Slow rate;Small sips/bites Postural Changes: Remain upright for at least 30 minutes after po intake;Seated upright at 90 degrees    Other  Recommendations Oral Care Recommendations: Oral care BID    Recommendations for follow up therapy are one component of a multi-disciplinary discharge planning process, led by the attending physician.  Recommendations may be updated based on patient status, additional functional criteria and insurance authorization.  Follow up Recommendations No SLP follow up      Assistance Recommended at Discharge None  Functional Status Assessment Patient has had a recent decline in their functional status and demonstrates the ability to make significant improvements in function in a reasonable and predictable amount of time.  Frequency and Duration   N/A         Prognosis   N/A     Swallow Study   General Date of Onset: 03/03/22 HPI: 88 yo male presenting 6/7 with weakness, fatigue, poor appetite, and fall while walking to the bathroom on day of admission. Pt found to be in afib with  frequent PVCs (new after ablation 20 years ago). CT revealed mid/distal SBO, NG tube placed 6/7. MRI revealed R cerebellar infarct, possible subacute right frontal lobe CVA. PMH includes: DM II, HLD, PVD, PD, PAF, CKD III, colon cancer, and BPH. Type of Study: Bedside Swallow Evaluation Previous Swallow Assessment: none found Diet Prior to this Study:  Thin liquids;Other (Comment) (full liquids (surgery rec)) Temperature Spikes Noted: No Respiratory Status: Room air History of Recent Intubation: No Behavior/Cognition: Alert;Pleasant mood;Cooperative Oral Cavity Assessment: Within Functional Limits Oral Care Completed by SLP: No Oral Cavity - Dentition: Edentulous Vision: Functional for self-feeding Self-Feeding Abilities: Able to feed self Patient Positioning: Upright in bed Baseline Vocal Quality: Normal Volitional Cough: Strong Volitional Swallow: Able to elicit    Oral/Motor/Sensory Function Overall Oral Motor/Sensory Function: Within functional limits   Ice Chips     Thin Liquid Thin Liquid: Within functional limits Presentation: Straw;Self Fed    Nectar Thick     Honey Thick     Puree Puree: Not tested   Solid     Solid: Within functional limits Presentation: [place], MA, CCC-SLP Speech Therapy

## 2022-03-04 NOTE — Progress Notes (Addendum)
Patient ID: Steven Ward, male   DOB: 06/18/1925, 88 y.o.   MRN: [id number] [facility] Surgery Progress Note     Subjective: Continues to have BMs and is tolerating his Full liquids well.  No nausea or vomiting.    Objective: Vital signs in last 24 hours: Temp:  [98.3 F (36.8 C)-98.4 F (36.9 C)] 98.3 F (36.8 C) (06/17 0807) Pulse Rate:  [61-77] 77 (06/17 0807) Resp:  [16-23] 23 (06/17 0807) BP: (100-133)/(49-60) 133/49 (06/17 0807) SpO2:  [98 %-99 %] 99 % (06/17 0807) Weight:  [79.9 kg] 79.9 kg (06/17 0328) Last BM Date : 03/03/22  Intake/Output from previous day: 06/16 0701 - 06/17 0700 In: 320 [P.O.:320] Out: 2050 [Urine:2050] Intake/Output this shift: Total I/O In: 240 [P.O.:240] Out: -   PE: Gen:  Alert, NAD, pleasant Abd: soft, ND, +BS, NT  Lab Results:  Recent Labs    03/02/22 0530 03/03/22 0314  WBC 10.4 11.8*  HGB 9.6* 9.8*  HCT 28.0* 28.9*  PLT 102* 104*    BMET Recent Labs    03/03/22 0314 03/04/22 0224  NA 137 132*  K 3.7 4.2  CL 109 104  CO2 22 18*  GLUCOSE 82 215*  BUN 41* 41*  CREATININE 1.61* 1.50*  CALCIUM 8.0* 7.8*    PT/INR No results for input(s): "LABPROT", "INR" in the last 72 hours. CMP     Component Value Date/Time   NA 132 (L) 03/04/2022 0224   K 4.2 03/04/2022 0224   CL 104 03/04/2022 0224   CO2 18 (L) 03/04/2022 0224   GLUCOSE 215 (H) 03/04/2022 0224   BUN 41 (H) 03/04/2022 0224   CREATININE 1.50 (H) 03/04/2022 0224   CALCIUM 7.8 (L) 03/04/2022 0224   PROT 4.8 (L) 03/03/2022 0314   ALBUMIN 1.8 (L) 03/03/2022 0314   AST 24 03/03/2022 0314   ALT <5 03/03/2022 0314   ALKPHOS 50 03/03/2022 0314   BILITOT 0.7 03/03/2022 0314   GFRNONAA 43 (L) 03/04/2022 0224   GFRAA  04/26/2009 0920    >60        The eGFR has been calculated using the MDRD equation. This calculation has not been validated in all clinical situations. eGFR's persistently <60 mL/min signify possible Chronic Kidney Disease.   Lipase   No results found for: "LIPASE"     Studies/Results: No results found.  Anti-infectives: Anti-infectives (From admission, onward)    Start     Dose/Rate Route Frequency Ordered Stop   02/23/22 0000  cefTRIAXone (ROCEPHIN) 2 g in sodium chloride 0.9 % 100 mL IVPB  Status:  Discontinued        2 g 200 mL/hr over 30 Minutes Intravenous Daily at 10 pm 02/22/22 2346 02/28/22 1142   02/23/22 0000  metroNIDAZOLE (FLAGYL) IVPB 500 mg  Status:  Discontinued        500 mg 100 mL/hr over 60 Minutes Intravenous 2 times daily 02/22/22 2346 02/23/22 1802        Assessment/Plan Partial SBO-resolving -adv to reg diet -cont protein shakes today -can d/c TNA today   FEN - FLD/Breeze/TNA VTE - eliquis ID - rocephin 6/8>> for CAP   New CVA this admit DM HTN Pancreatic insufficiency A fib on eliquis  Parkinson's disease Hypothyroidism BPH Pulmonary fibrosis/RLL mass   I reviewed hospitalist notes, last 24 h vitals and pain scores, last 48 h intake and output, last 24 h labs and trends, and last 24 h imaging results.     LOS: 9 days  Rosario Adie, [facility] 03/04/2022, 9:32 AM Please see Amion for pager number during day hours 7:00am-4:30pm

## 2022-03-05 DIAGNOSIS — I480 Paroxysmal atrial fibrillation: Secondary | ICD-10-CM | POA: Diagnosis not present

## 2022-03-05 LAB — CBC WITH DIFFERENTIAL/PLATELET
Abs Immature Granulocytes: 0.05 10*3/uL (ref 0.00–0.07)
Basophils Absolute: 0 10*3/uL (ref 0.0–0.1)
Basophils Relative: 0 %
Eosinophils Absolute: 0.2 10*3/uL (ref 0.0–0.5)
Eosinophils Relative: 3 %
HCT: 27.6 % — ABNORMAL LOW (ref 39.0–52.0)
Hemoglobin: 9.4 g/dL — ABNORMAL LOW (ref 13.0–17.0)
Immature Granulocytes: 1 %
Lymphocytes Relative: 10 %
Lymphs Abs: 0.8 10*3/uL (ref 0.7–4.0)
MCH: 32.8 pg (ref 26.0–34.0)
MCHC: 34.1 g/dL (ref 30.0–36.0)
MCV: 96.2 fL (ref 80.0–100.0)
Monocytes Absolute: 0.9 10*3/uL (ref 0.1–1.0)
Monocytes Relative: 12 %
Neutro Abs: 6 10*3/uL (ref 1.7–7.7)
Neutrophils Relative %: 74 %
Platelets: 149 10*3/uL — ABNORMAL LOW (ref 150–400)
RBC: 2.87 MIL/uL — ABNORMAL LOW (ref 4.22–5.81)
RDW: 15.2 % (ref 11.5–15.5)
WBC: 8 10*3/uL (ref 4.0–10.5)
nRBC: 0 % (ref 0.0–0.2)

## 2022-03-05 LAB — COMPREHENSIVE METABOLIC PANEL
ALT: 6 U/L (ref 0–44)
AST: 19 U/L (ref 15–41)
Albumin: 1.8 g/dL — ABNORMAL LOW (ref 3.5–5.0)
Alkaline Phosphatase: 62 U/L (ref 38–126)
Anion gap: 7 (ref 5–15)
BUN: 43 mg/dL — ABNORMAL HIGH (ref 8–23)
CO2: 23 mmol/L (ref 22–32)
Calcium: 8 mg/dL — ABNORMAL LOW (ref 8.9–10.3)
Chloride: 107 mmol/L (ref 98–111)
Creatinine, Ser: 1.67 mg/dL — ABNORMAL HIGH (ref 0.61–1.24)
GFR, Estimated: 37 mL/min — ABNORMAL LOW (ref >60)
Glucose, Bld: 190 mg/dL — ABNORMAL HIGH (ref 70–99)
Potassium: 4.6 mmol/L (ref 3.5–5.1)
Sodium: 137 mmol/L (ref 135–145)
Total Bilirubin: 0.7 mg/dL (ref 0.3–1.2)
Total Protein: 5.1 g/dL — ABNORMAL LOW (ref 6.5–8.1)

## 2022-03-05 LAB — PHOSPHORUS: Phosphorus: 3.6 mg/dL (ref 2.5–4.6)

## 2022-03-05 LAB — GLUCOSE, CAPILLARY
Glucose-Capillary: 158 mg/dL — ABNORMAL HIGH (ref 70–99)
Glucose-Capillary: 188 mg/dL — ABNORMAL HIGH (ref 70–99)
Glucose-Capillary: 182 mg/dL — ABNORMAL HIGH (ref 70–99)
Glucose-Capillary: 73 mg/dL (ref 70–99)

## 2022-03-05 LAB — MAGNESIUM: Magnesium: 1.9 mg/dL (ref 1.7–2.4)

## 2022-03-05 NOTE — Progress Notes (Signed)
Occupational Therapy Treatment Patient Details Name: Steven Ward MRN: [id number] DOB: [birth year] Today's Date: 03/05/2022   History of present illness The pt is a 88 yo male presenting 6/7 with weakness, fatigue, poor appetite, and fall while walking to the bathroom on day of admission. Pt found to be in afib with frequent PVCs (new after ablation 20 years ago). CT revealed mid/distal SBO, NG tube placed 6/7. MRI revealed R cerebellar infarct. PMH includes: DM II, HLD, PVD, PD, PAF, CKD III, colon cancer, and BPH.   OT comments  Pt had been sitting in chair for several hours. Agreeable to ADL activity before getting back in bed   Recommendations for follow up therapy are one component of a multi-disciplinary discharge planning process, led by the attending physician.  Recommendations may be updated based on patient status, additional functional criteria and insurance authorization.    Follow Up Recommendations  Acute inpatient rehab (3hours/day)    Assistance Recommended at Discharge Frequent or constant Supervision/Assistance           Precautions / Restrictions Precautions Precautions: Fall       Mobility Bed Mobility   Bed Mobility: Sit to Supine       Sit to supine: Mod assist   General bed mobility comments: A with BLE    Transfers Overall transfer level: Needs assistance Equipment used: 1 person hand held assist Transfers: Sit to/from Stand, Bed to chair/wheelchair/BSC Sit to Stand: Mod assist Stand pivot transfers: Mod assist   Step pivot transfers: Mod assist     General transfer comment: chair to bed     Balance Overall balance assessment: Needs assistance Sitting-balance support: No upper extremity supported, Feet supported Sitting balance-Leahy Scale: Good     Standing balance support: During functional activity, Single extremity supported Standing balance-Leahy Scale: Poor                             ADL either performed or  assessed with clinical judgement   ADL Overall ADL's : Needs assistance/impaired     Grooming: Wash/dry hands;Wash/dry face;Set up;Sitting Grooming Details (indicate cue type and reason): in chair                 Toilet Transfer: Moderate assistance;Stand-pivot Toilet Transfer Details (indicate cue type and reason): simulated to recliner         Functional mobility during ADLs: Minimal assistance;Moderate assistance;Cueing for safety;Cueing for sequencing      Extremity/Trunk Assessment Upper Extremity Assessment Upper Extremity Assessment: Generalized weakness             Cognition Arousal/Alertness: Awake/alert Behavior During Therapy: WFL for tasks assessed/performed Overall Cognitive Status: No family/caregiver present to determine baseline cognitive functioning                         Following Commands: Follows multi-step commands with increased time       General Comments: did well with commands and directions this day                   Pertinent Vitals/ Pain       Pain Assessment Pain Assessment: No/denies pain         Frequency  Min 2X/week        Progress Toward Goals  OT Goals(current goals can now be found in the care plan section)     Acute Rehab OT Goals OT Goal Formulation: With  patient Time For Goal Achievement: 03/09/22 Potential to Achieve Goals: [place] Discharge plan remains appropriate       AM-PAC OT "6 Clicks" Daily Activity     Outcome Measure   Help from another person eating meals?: A Little Help from another person taking care of personal grooming?: A Little Help from another person toileting, which includes using toliet, bedpan, or urinal?: A Lot Help from another person bathing (including washing, rinsing, drying)?: A Lot   Help from another person to put on and taking off regular lower body clothing?: A Lot 6 Click Score: 12    End of Session Equipment Utilized During Treatment: Gait  belt  OT Visit Diagnosis: Unsteadiness on feet (R26.81);Muscle weakness (generalized) (M62.81);Ataxia, unspecified (R27.0)   Activity Tolerance Patient tolerated treatment well   Patient Left in bed;with call bell/phone within reach;with bed alarm set   Nurse Communication Mobility status        Time: 1545-1600 OT Time Calculation (min): 15 min  Charges: OT General Charges $OT Visit: 1 Visit OT Treatments $Self Care/Home Management : 8-22 mins  Kari Baars, [place] [phone] Office- [phone]     Marlia Schewe, Edwena Felty D 03/05/2022, 4:59 PM

## 2022-03-05 NOTE — Progress Notes (Signed)
Patient nearly suffered fall transferring from [facility] to bed with walker. Stabilized by RN. This difficulty is incongruous with previously charted mobility assessment including equipment used, level of assistance, and balance. Will utilized bedpan for further attempts at bowel movement until reassessed in AM.

## 2022-03-05 NOTE — Progress Notes (Signed)
PROGRESS NOTE    Steven Ward  WPY:[id number] DOB: 04-Mar-1925 DOA: 02/22/2022 PCP: [name], MD  Chief Complaint  Patient presents with   Weakness   Fatigue    Brief Narrative:  Steven Ward is Steven Ward 88 y.o. male with medical history significant of paroxysmal [place] Bogusz-fib not on anticoagulation, hypertension, hypothyroidism, Parkinson's disease, lumbar spondylosis, type 2 diabetes, hyperlipidemia, PVD, CKD stage IIIb, remote history of colon cancer, BPH, pancreatic insufficiency, gallstones, GERD. Patient presented secondary to generalized weakness and found to have evidence of atrial fibrillation, SBO and acute infarct. General surgery consulted; NPO with NG tube placed. Cardiology consulted and patient's rate managed with beta blocker. Neurology consulted for stroke. SBO persistent and likely partial. PICC ordered for TPN starting 6/13.    Assessment & Plan:   Principal Problem:   Paroxysmal atrial fibrillation (HCC) Active Problems:   SBO (small bowel obstruction) (HCC)   Hypokalemia   Hypomagnesemia   Hypophosphatemia   Acute kidney injury superimposed on chronic kidney disease (HCC)   Metabolic acidosis   Acute CVA (cerebrovascular accident) ([place])   Generalized weakness   Essential hypertension   Pneumonia   T2DM (type 2 diabetes mellitus) ([place])   PD (Parkinson's disease) ([place])   Hypothyroidism   BPH (benign prostatic hyperplasia)   Decubitus ulcer   Compression fracture of body of thoracic vertebra (HCC)   Pleural plaque   Pulmonary fibrosis (HCC)   Right lower lobe lung mass   CKD stage 3 due to type 2 diabetes mellitus (HCC)   Nausea & vomiting   Stage 3b chronic kidney disease (CKD) (HCC)   Assessment and Plan: * Paroxysmal atrial fibrillation ([place]) Rate controlled. Cardiology consulted. Transthoracic Echocardiogram ordered and significant for severely dilated left atrium with normal LVEF. Anticoagulation deferred secondary to acute infarct, per  cardiology. Started on metoprolol PO -Cardiology recommendations: Metoprolol 12.5 mg BID -eliquis  SBO (small bowel obstruction) ([name]) KUB 6/13 with contrast throughout colon Improvement in small bowel gas pattern, few persistent gas filled dilated loops NGT removed, regular diet, ADAT per surgery  Continue PICC/TNA for now -> d/c TPN today Appreciate assistance  Hypokalemia Will follow, supplement as needed Goal >4   Hypophosphatemia Replace and follow  Hypomagnesemia Goal >2 Follow, supplement prn  Acute kidney injury superimposed on chronic kidney disease ([name]) Baseline appears to be around 2.2? Improved with IVF Peaked around 2.4ish Currently lower than baseline, trend  Metabolic acidosis Mild, follow  Acute CVA (cerebrovascular accident) ([name]) Acute punctate infarct noted on MRI located within the right cerebellar hemisphere in addition to possible subacute infarct of right frontal lobe. LDL of 34. Hemoglobin A1C of 7.7%. Transthoracic Echocardiogram with normal LVEF of 55-60% with no evidence of atrial level shunt. -Neurology recommendations: Aspirin 81 mg, Eliquis. Outpatient follow-up. Signed off. -PT/OT recommending CIR, pending at this time -SLP following    Generalized weakness CIR recommended by therapy  Essential hypertension On thiazide and loop outpatient - hold Continue metoprolol (started inpatient)  Pneumonia S/p treatment with ceftriaxone/flagyl for pneumonia  T2DM (type 2 diabetes mellitus) ([name]) SSI A1c 7.9 Insulin per pharmacy  PD (Parkinson's disease) ([name]) sinemet  Hypothyroidism synthroid  BPH (benign prostatic hyperplasia) flomax  Right lower lobe lung mass PCP and/or pulm follow up outpatient Needs repeat imaging within 3 months  Pulmonary fibrosis ([place]) pulm f/u outpatient  Pleural plaque Follow with pulm outpatient  Compression fracture of body of thoracic vertebra ([name]) T5 and T12, noted, follow  Decubitus  ulcer Pressure Injury 02/25/22 Coccyx  Unstageable - Full thickness tissue loss in which the base of the injury is covered by slough (yellow, tan, gray, green or brown) and/or eschar (tan, brown or black) in the wound bed. (Active)  02/25/22 2100  Location: Coccyx  Location Orientation:   Staging: Unstageable - Full thickness tissue loss in which the base of the injury is covered by slough (yellow, tan, gray, green or brown) and/or eschar (tan, brown or black) in the wound bed.  Wound Description (Comments):   Present on Admission: Yes          DVT prophylaxis: eliquis Code Status: dnr Family Communication: none Disposition:   Status is: Inpatient Remains inpatient appropriate because: pending further improvement   Consultants:  Surgery Cards neuro  Procedures:  Carotid US Summary:  Right Carotid: Velocities in the right ICA are consistent with Steven Ward 1-39%  stenosis.   Left Carotid: Velocities in the left ICA are consistent with Steven Ward 1-39%  stenosis.   Vertebrals:  Bilateral vertebral arteries demonstrate antegrade flow.  Subclavians: Normal flow hemodynamics were seen in bilateral subclavian               arteries.   Echo IMPRESSIONS     1. Left ventricular ejection fraction, by estimation, is 55 to 60%. The  left ventricle has normal function. The left ventricle has no regional  wall motion abnormalities. There is mild concentric left ventricular  hypertrophy. Left ventricular diastolic  function could not be evaluated.   2. Right ventricular systolic function is normal. The right ventricular  size is normal. There is normal pulmonary artery systolic pressure.   3. Left atrial size was severely dilated.   4. The mitral valve is normal in structure. Trivial mitral valve  regurgitation. No evidence of mitral stenosis.   5. The aortic valve is calcified. There is mild calcification of the  aortic valve. There is mild thickening of the aortic valve. Aortic valve   regurgitation is not visualized. No aortic stenosis is present.   6. The inferior vena cava is normal in size with greater than 50%  respiratory variability, suggesting right atrial pressure of 3 mmHg.   Antimicrobials:  Anti-infectives (From admission, onward)    Start     Dose/Rate Route Frequency Ordered Stop   02/23/22 0000  cefTRIAXone (ROCEPHIN) 2 g in sodium chloride 0.9 % 100 mL IVPB  Status:  Discontinued        2 g 200 mL/hr over 30 Minutes Intravenous Daily at 10 pm 02/22/22 2346 02/28/22 1142   02/23/22 0000  metroNIDAZOLE (FLAGYL) IVPB 500 mg  Status:  Discontinued        500 mg 100 mL/hr over 60 Minutes Intravenous 2 times daily 02/22/22 2346 02/23/22 1802       Subjective: No complaints  Objective: Vitals:   03/04/22 1406 03/04/22 2015 03/05/22 0347 03/05/22 1109  BP: (!) 144/70 (!) 112/59 (!) 122/55 (!) 113/50  Pulse: 85 69 63 74  Resp: (!) '26 19 20 16  '$ Temp: 98.4 F (36.9 C) 98.4 F (36.9 C) 98.1 F (36.7 C) 97.9 F (36.6 C)  TempSrc: Oral Oral Oral Oral  SpO2: 99% 98% 98% 99%  Weight:   79.4 kg   Height:        Intake/Output Summary (Last 24 hours) at 03/05/2022 1420 Last data filed at 03/05/2022 1300 Gross per 24 hour  Intake 680 ml  Output 300 ml  Net 380 ml   Filed Weights   03/03/22 0100 03/04/22 0328 03/05/22  0347  Weight: 77.5 kg 79.9 kg 79.4 kg    Examination:  General: No acute distress. Cardiovascular: RRR Lungs: unlabored Abdomen: Soft, nontender, nondistended  Neurological: Alert and oriented 3. Moves all extremities 4 with equal strength. Cranial nerves II through XII grossly intact. Extremities: No clubbing or cyanosis. No edema.  Data Reviewed: I have personally reviewed following labs and imaging studies  CBC: Recent Labs  Lab 02/27/22 0210 03/01/22 0420 03/02/22 0530 03/03/22 0314 03/05/22 0353  WBC 10.6* 10.4 10.4 11.8* 8.0  NEUTROABS  --   --  8.4* 9.7* 6.0  HGB 11.0* 10.2* 9.6* 9.8* 9.4*  HCT 32.8* 29.9*  28.0* 28.9* 27.6*  MCV 97.9 97.7 96.2 97.0 96.2  PLT 131* 109* 102* 104* 149*    Basic Metabolic Panel: Recent Labs  Lab 03/01/22 0420 03/02/22 0530 03/03/22 0314 03/04/22 0224 03/05/22 0353  NA 137 137 137 132* 137  K 3.5 3.9 3.7 4.2 4.6  CL 111 109 109 104 107  CO2 19* 21* 22 18* 23  GLUCOSE 185* 240* 82 215* 190*  BUN 45* 38* 41* 41* 43*  CREATININE 1.87* 1.59* 1.61* 1.50* 1.67*  CALCIUM 7.7* 7.8* 8.0* 7.8* 8.0*  MG 1.9 1.9 2.2 2.0 1.9  PHOS 2.5 1.8* 2.4* 3.7 3.6    GFR: Estimated Creatinine Clearance: 25.6 mL/min (Haliegh Khurana) (by C-G formula based on SCr of 1.67 mg/dL (H)).  Liver Function Tests: Recent Labs  Lab 03/01/22 0420 03/02/22 0530 03/03/22 0314 03/05/22 0353  AST '16 17 24 19  '$ ALT 5 6 <5 6  ALKPHOS 45 47 50 62  BILITOT 0.8 0.6 0.7 0.7  PROT 4.6* 4.4* 4.8* 5.1*  ALBUMIN 1.8* 1.7* 1.8* 1.8*    CBG: Recent Labs  Lab 03/04/22 1135 03/04/22 1618 03/04/22 2110 03/05/22 0605 03/05/22 1107  GLUCAP 143* 196* 144* 158* 188*     No results found for this or any previous visit (from the past 240 hour(s)).       Radiology Studies: No results found.      Scheduled Meds:   stroke: early stages of recovery book   Does not apply Once   apixaban  2.5 mg Oral BID   aspirin  81 mg Oral Daily   Carbidopa-Levodopa ER  1 tablet Oral QPM   Carbidopa-Levodopa ER  2 tablet Oral BID   Chlorhexidine Gluconate Cloth  6 each Topical Daily   feeding supplement  237 mL Oral BID BM   insulin aspart  0-15 Units Subcutaneous TID WC   insulin aspart  0-5 Units Subcutaneous QHS   insulin aspart  2 Units Subcutaneous TID WC   leptospermum manuka honey  1 application  Topical Daily   levothyroxine  25 mcg Oral Daily   metoprolol tartrate  12.5 mg Oral BID   sodium chloride flush  10-40 mL Intracatheter Q12H   tamsulosin  0.4 mg Oral Daily   Continuous Infusions:     LOS: 10 days    Time spent: over 30 min    Fayrene Helper, MD [facility]   To  contact the attending provider between 7A-7P or the covering provider during after hours 7P-7A, please log into the web site [email or web address] and access using universal [place] password for that web site. If you do not have the password, please call the hospital operator.  03/05/2022, 2:20 PM

## 2022-03-05 NOTE — Progress Notes (Signed)
Patient ID: Steven Ward, male   DOB: July 24, 1925, 88 y.o.   MRN: [id number] [facility] Progress Note     Subjective: Continues to have BMs and is tolerating a solid diet.  No nausea or vomiting.    Objective: Vital signs in last 24 hours: Temp:  [98.1 F (36.7 C)-98.4 F (36.9 C)] 98.1 F (36.7 C) (06/18 0347) Pulse Rate:  [63-85] 63 (06/18 0347) Resp:  [19-26] 20 (06/18 0347) BP: (112-144)/(55-70) 122/55 (06/18 0347) SpO2:  [98 %-99 %] 98 % (06/18 0347) Weight:  [79.4 kg] 79.4 kg (06/18 0347) Last BM Date : 03/04/22  Intake/Output from previous day: 06/17 0701 - 06/18 0700 In: 2065.5 [P.O.:960; I.V.:1105.5] Out: 300 [Urine:300] Intake/Output this shift: No intake/output data recorded.  PE: Gen:  Alert, NAD, pleasant Abd: soft, ND, +BS, NT  Lab Results:  Recent Labs    03/03/22 0314 03/05/22 0353  WBC 11.8* 8.0  HGB 9.8* 9.4*  HCT 28.9* 27.6*  PLT 104* 149*    BMET Recent Labs    03/04/22 0224 03/05/22 0353  NA 132* 137  K 4.2 4.6  CL 104 107  CO2 18* 23  GLUCOSE 215* 190*  BUN 41* 43*  CREATININE 1.50* 1.67*  CALCIUM 7.8* 8.0*    PT/INR No results for input(s): "LABPROT", "INR" in the last 72 hours. CMP     Component Value Date/Time   NA 137 03/05/2022 0353   K 4.6 03/05/2022 0353   CL 107 03/05/2022 0353   CO2 23 03/05/2022 0353   GLUCOSE 190 (H) 03/05/2022 0353   BUN 43 (H) 03/05/2022 0353   CREATININE 1.67 (H) 03/05/2022 0353   CALCIUM 8.0 (L) 03/05/2022 0353   PROT 5.1 (L) 03/05/2022 0353   ALBUMIN 1.8 (L) 03/05/2022 0353   AST 19 03/05/2022 0353   ALT 6 03/05/2022 0353   ALKPHOS 62 03/05/2022 0353   BILITOT 0.7 03/05/2022 0353   GFRNONAA 37 (L) 03/05/2022 0353   GFRAA  04/26/2009 0920    >60        The eGFR has been calculated using the MDRD equation. This calculation has not been validated in all clinical situations. eGFR's persistently <60 mL/min signify possible Chronic Kidney Disease.   Lipase  No  results found for: "LIPASE"     Studies/Results: No results found.  Anti-infectives: Anti-infectives (From admission, onward)    Start     Dose/Rate Route Frequency Ordered Stop   02/23/22 0000  cefTRIAXone (ROCEPHIN) 2 g in sodium chloride 0.9 % 100 mL IVPB  Status:  Discontinued        2 g 200 mL/hr over 30 Minutes Intravenous Daily at 10 pm 02/22/22 2346 02/28/22 1142   02/23/22 0000  metroNIDAZOLE (FLAGYL) IVPB 500 mg  Status:  Discontinued        500 mg 100 mL/hr over 60 Minutes Intravenous 2 times daily 02/22/22 2346 02/23/22 1802        Assessment/Plan Partial SBO-resolving -Cont reg diet -cont protein shakes    FEN - reg diet VTE - eliquis ID - rocephin 6/8>> for CAP   New CVA this admit DM HTN Pancreatic insufficiency A fib on eliquis  Parkinson's disease Hypothyroidism BPH Pulmonary fibrosis/RLL mass   I reviewed hospitalist notes, last 24 h vitals and pain scores, last 48 h intake and output, last 24 h labs and trends, and last 24 h imaging results.   SBO resolved.  F/u PRN.  Will sign off.   LOS: 10 days  Rosario Adie, [facility] 03/05/2022, 8:17 AM Please see Amion for pager number during day hours 7:00am-4:30pm

## 2022-03-06 DIAGNOSIS — I480 Paroxysmal atrial fibrillation: Secondary | ICD-10-CM | POA: Diagnosis not present

## 2022-03-06 LAB — GLUCOSE, CAPILLARY
Glucose-Capillary: 114 mg/dL — ABNORMAL HIGH (ref 70–99)
Glucose-Capillary: 180 mg/dL — ABNORMAL HIGH (ref 70–99)
Glucose-Capillary: 137 mg/dL — ABNORMAL HIGH (ref 70–99)
Glucose-Capillary: 307 mg/dL — ABNORMAL HIGH (ref 70–99)
Glucose-Capillary: 132 mg/dL — ABNORMAL HIGH (ref 70–99)

## 2022-03-06 LAB — COMPREHENSIVE METABOLIC PANEL
ALT: 5 U/L (ref 0–44)
AST: 19 U/L (ref 15–41)
Albumin: 1.7 g/dL — ABNORMAL LOW (ref 3.5–5.0)
Alkaline Phosphatase: 52 U/L (ref 38–126)
Anion gap: 8 (ref 5–15)
BUN: 42 mg/dL — ABNORMAL HIGH (ref 8–23)
CO2: 21 mmol/L — ABNORMAL LOW (ref 22–32)
Calcium: 7.6 mg/dL — ABNORMAL LOW (ref 8.9–10.3)
Chloride: 108 mmol/L (ref 98–111)
Creatinine, Ser: 1.79 mg/dL — ABNORMAL HIGH (ref 0.61–1.24)
GFR, Estimated: 34 mL/min — ABNORMAL LOW (ref >60)
Glucose, Bld: 111 mg/dL — ABNORMAL HIGH (ref 70–99)
Potassium: 3.9 mmol/L (ref 3.5–5.1)
Sodium: 137 mmol/L (ref 135–145)
Total Bilirubin: 0.7 mg/dL (ref 0.3–1.2)
Total Protein: 5.1 g/dL — ABNORMAL LOW (ref 6.5–8.1)

## 2022-03-06 LAB — CBC WITH DIFFERENTIAL/PLATELET
Abs Immature Granulocytes: 0.03 10*3/uL (ref 0.00–0.07)
Basophils Absolute: 0 10*3/uL (ref 0.0–0.1)
Basophils Relative: 0 %
Eosinophils Absolute: 0.2 10*3/uL (ref 0.0–0.5)
Eosinophils Relative: 2 %
HCT: 26.2 % — ABNORMAL LOW (ref 39.0–52.0)
Hemoglobin: 8.7 g/dL — ABNORMAL LOW (ref 13.0–17.0)
Immature Granulocytes: 0 %
Lymphocytes Relative: 10 %
Lymphs Abs: 0.8 10*3/uL (ref 0.7–4.0)
MCH: 32.7 pg (ref 26.0–34.0)
MCHC: 33.2 g/dL (ref 30.0–36.0)
MCV: 98.5 fL (ref 80.0–100.0)
Monocytes Absolute: 1.1 10*3/uL — ABNORMAL HIGH (ref 0.1–1.0)
Monocytes Relative: 13 %
Neutro Abs: 6.1 10*3/uL (ref 1.7–7.7)
Neutrophils Relative %: 75 %
Platelets: 176 10*3/uL (ref 150–400)
RBC: 2.66 MIL/uL — ABNORMAL LOW (ref 4.22–5.81)
RDW: 15.2 % (ref 11.5–15.5)
WBC: 8.3 10*3/uL (ref 4.0–10.5)
nRBC: 0 % (ref 0.0–0.2)

## 2022-03-06 LAB — MAGNESIUM: Magnesium: 1.7 mg/dL (ref 1.7–2.4)

## 2022-03-06 LAB — PHOSPHORUS: Phosphorus: 3.5 mg/dL (ref 2.5–4.6)

## 2022-03-06 MED ORDER — POTASSIUM CHLORIDE CRYS ER 20 MEQ PO TBCR: 40.0000 meq | EXTENDED_RELEASE_TABLET | Freq: Once | ORAL | Status: DC

## 2022-03-06 MED ORDER — POTASSIUM CHLORIDE CRYS ER 20 MEQ PO TBCR
20.0000 meq | EXTENDED_RELEASE_TABLET | Freq: Once | ORAL | Status: AC
Start: 2022-03-06 — End: 2022-03-06
  Administered 2022-03-06: 20 meq via ORAL
  Filled 2022-03-06: qty 1

## 2022-03-06 MED ORDER — MAGNESIUM OXIDE -MG SUPPLEMENT 400 (240 MG) MG PO TABS
400.0000 mg | ORAL_TABLET | Freq: Two times a day (BID) | ORAL | Status: DC
  Administered 2022-03-06 – 2022-03-09 (×6): 400 mg via ORAL
  Filled 2022-03-06 (×6): qty 1

## 2022-03-06 NOTE — Progress Notes (Signed)
Physical Therapy Treatment Patient Details Name: Steven Ward MRN: [id number] DOB: 1925-04-10 Today's Date: 03/06/2022   History of Present Illness The pt is a 88 yo male presenting 6/7 with weakness, fatigue, poor appetite, and fall while walking to the bathroom on day of admission. Pt found to be in afib with frequent PVCs (new after ablation 20 years ago). CT revealed mid/distal SBO, NG tube placed 6/7. MRI revealed R cerebellar infarct. PMH includes: DM II, HLD, PVD, PD, PAF, CKD III, colon cancer, and BPH.    PT Comments    Pt was seen for mobility and declines walk due to having walked recently.  Despite fatigue did agree to do some strengthening on BLE's.  Pt is still recommended to CIR due to the extent of assistance to walk and stand, to recover the LE strength lost and to increase his control of mobility.  Has help at home to follow up and ensure better success.  Follow along with all goals of inpt PT as outlined in POC.   Recommendations for follow up therapy are one component of a multi-disciplinary discharge planning process, led by the attending physician.  Recommendations may be updated based on patient status, additional functional criteria and insurance authorization.  Follow Up Recommendations  Acute inpatient rehab (3hours/day)     Assistance Recommended at Discharge Frequent or constant Supervision/Assistance  Patient can return home with the following Two people to help with walking and/or transfers;A lot of help with bathing/dressing/bathroom;Assistance with cooking/housework;Direct supervision/assist for medications management;Direct supervision/assist for financial management;Assist for transportation;Help with stairs or ramp for entrance   Equipment Recommendations  Other (comment) (allow CIR to determine)    Recommendations for Other Services Rehab consult     Precautions / Restrictions Precautions Precautions: Fall Restrictions Weight Bearing Restrictions:  No     Mobility  Bed Mobility Overal bed mobility: Needs Assistance             General bed mobility comments: rolls with min guard for lines    Transfers                   General transfer comment: declined OOB    Ambulation/Gait                   Stairs             Wheelchair Mobility    Modified Rankin (Stroke Patients Only)       Balance                                            Cognition Arousal/Alertness: Awake/alert Behavior During Therapy: WFL for tasks assessed/performed Overall Cognitive Status: No family/caregiver present to determine baseline cognitive functioning Area of Impairment: Following commands                       Following Commands: Follows multi-step commands with increased time Safety/Judgement: Decreased awareness of deficits, Decreased awareness of safety Awareness: Emergent Problem Solving: Slow processing, Requires verbal cues, Requires tactile cues, Decreased initiation          Exercises General Exercises - Lower Extremity Ankle Circles/Pumps: AAROM, 5 reps Gluteal Sets: AAROM, 10 reps Heel Slides: AAROM, 15 reps Hip ABduction/ADduction: AAROM, 15 reps Straight Leg Raises: AAROM, 15 reps    General Comments General comments (skin integrity, edema, etc.): Pt was seen for  moving on RW but declined over having just walked.  He is motivated to do ther ex to both legs, RLE requires more help than LLE      Pertinent Vitals/Pain Pain Assessment Pain Assessment: No/denies pain    Home Living                          Prior Function            PT Goals (current goals can now be found in the care plan section) Acute Rehab PT Goals Patient Stated Goal: none stated Progress towards PT goals: Progressing toward goals    Frequency    Min 4X/week      PT Plan Current plan remains appropriate    Co-evaluation              AM-PAC PT "6 Clicks"  Mobility   Outcome Measure  Help needed turning from your back to your side while in a flat bed without using bedrails?: A Lot Help needed moving from lying on your back to sitting on the side of a flat bed without using bedrails?: A Lot Help needed moving to and from a bed to a chair (including a wheelchair)?: A Lot Help needed standing up from a chair using your arms (e.g., wheelchair or bedside chair)?: A Lot Help needed to walk in hospital room?: A Lot Help needed climbing 3-5 steps with a railing? : Total 6 Click Score: 11    End of Session Equipment Utilized During Treatment: Gait belt Activity Tolerance: Patient tolerated treatment well Patient left: in bed;with call bell/phone within reach;with bed alarm set Nurse Communication: Mobility status PT Visit Diagnosis: Muscle weakness (generalized) (M62.81)     Time: 1448-1500 PT Time Calculation (min) (ACUTE ONLY): 12 min  Charges:  $Therapeutic Exercise: 8-22 mins      Ramond Dial 03/06/2022, 4:32 PM  Mee Hives, PT PhD Acute Rehab Dept. Number: [place]

## 2022-03-06 NOTE — Progress Notes (Signed)
Occupational Therapy Treatment Patient Details Name: Steven Ward CURRENT MRN: [id number] DOB: [birth year] Today's Date: 03/06/2022   History of present illness The pt is a 88 yo male presenting 6/7 with weakness, fatigue, poor appetite, and fall while walking to the bathroom on day of admission. Pt found to be in afib with frequent PVCs (new after ablation 20 years ago). CT revealed mid/distal SBO, NG tube placed 6/7. MRI revealed R cerebellar infarct. PMH includes: DM II, HLD, PVD, PD, PAF, CKD III, colon cancer, and BPH.   OT comments  Patient received in bed and agreeable to OT session. Patient was min assist to get to EOB due to assistance needed to scoot forward. Patient was mod assist to stand from EOB and ambulated to sink to perform grooming with min gurad assist due to posterior leaning. Patient performed transfer to [facility] with mod assist and verbal cues for hand placement. Patient performed mobility in hallway before returning to bed with mod assist to return to supine due to assistance needed with BLEs.  Acute OT to continue to follow.    Recommendations for follow up therapy are one component of a multi-disciplinary discharge planning process, led by the attending physician.  Recommendations may be updated based on patient status, additional functional criteria and insurance authorization.    Follow Up Recommendations  Acute inpatient rehab (3hours/day)    Assistance Recommended at Discharge Frequent or constant Supervision/Assistance  Patient can return home with the following  A lot of help with bathing/dressing/bathroom;Assistance with cooking/housework;Help with stairs or ramp for entrance;Assist for transportation;Direct supervision/assist for financial management;Direct supervision/assist for medications management;A lot of help with walking and/or transfers   Equipment Recommendations  BSC/3in1    Recommendations for Other Services      Precautions / Restrictions  Precautions Precautions: Fall Restrictions Weight Bearing Restrictions: No       Mobility Bed Mobility Overal bed mobility: Needs Assistance Bed Mobility: Supine to Sit, Sit to Supine     Supine to sit: Min assist, HOB elevated Sit to supine: Mod assist   General bed mobility comments: required assistance to get BLE into bed    Transfers Overall transfer level: Needs assistance Equipment used: Rolling walker (2 wheels) Transfers: Sit to/from Stand, Bed to chair/wheelchair/BSC Sit to Stand: Mod assist     Step pivot transfers: Mod assist     General transfer comment: stood from EOB and BSC with mod assist and cues for hand placement     Balance Overall balance assessment: Needs assistance Sitting-balance support: No upper extremity supported, Feet supported Sitting balance-Leahy Scale: Good Sitting balance - Comments: able to sit on EOB without assistance Postural control: Posterior lean Standing balance support: During functional activity, Single extremity supported Standing balance-Leahy Scale: Poor Standing balance comment: stood at sink for grooming tasks with posterior leaning                           ADL either performed or assessed with clinical judgement   ADL Overall ADL's : Needs assistance/impaired     Grooming: Wash/dry hands;Wash/dry face;Min guard;Standing Grooming Details (indicate cue type and reason): at sink         Upper Body Dressing : Minimal assistance;Sitting Upper Body Dressing Details (indicate cue type and reason): to [employer] Transfer: BSC/3in1;Moderate assistance Toilet Transfer Details (indicate cue type and reason): transfer training to [place] ADL  Comments: difficulty with sit to stands    Extremity/Trunk Assessment              Vision       Perception     Praxis      Cognition Arousal/Alertness: Awake/alert Behavior During Therapy: WFL for tasks  assessed/performed Overall Cognitive Status: No family/caregiver present to determine baseline cognitive functioning                         Following Commands: Follows multi-step commands with increased time       General Comments: required cues for safety        Exercises      Shoulder Instructions       General Comments      Pertinent Vitals/ Pain       Pain Assessment Pain Assessment: No/denies pain  Home Living                                          Prior Functioning/Environment              Frequency  Min 2X/week        Progress Toward Goals  OT Goals(current goals can now be found in the care plan section)  Progress towards OT goals: Progressing toward goals  Acute Rehab OT Goals OT Goal Formulation: With patient Time For Goal Achievement: 03/09/22 Potential to Achieve Goals: Fair ADL Goals Pt Will Perform Grooming: standing;with min guard assist Pt Will Perform Lower Body Dressing: with min assist;sit to/from stand Pt Will Transfer to Toilet: with min guard assist;ambulating;regular height toilet  Plan Discharge plan remains appropriate    Co-evaluation                 AM-PAC OT "6 Clicks" Daily Activity     Outcome Measure   Help from another person eating meals?: A Little Help from another person taking care of personal grooming?: A Little Help from another person toileting, which includes using toliet, bedpan, or urinal?: A Lot Help from another person bathing (including washing, rinsing, drying)?: A Lot Help from another person to put on and taking off regular upper body clothing?: A Little Help from another person to put on and taking off regular lower body clothing?: A Lot 6 Click Score: 15    End of Session Equipment Utilized During Treatment: Gait belt;Rolling walker (2 wheels)  OT Visit Diagnosis: Unsteadiness on feet (R26.81);Muscle weakness (generalized) (M62.81);Ataxia, unspecified  (R27.0)   Activity Tolerance Patient tolerated treatment well   Patient Left in bed;with call bell/phone within reach;with bed alarm set   Nurse Communication Mobility status        Time: 8768-1157 OT Time Calculation (min): 19 min  Charges: OT General Charges $OT Visit: 1 Visit OT Treatments $Self Care/Home Management : 8-22 mins  Lodema Hong, [place]  Office [place] 03/06/2022, 2:40 PM

## 2022-03-06 NOTE — Progress Notes (Signed)
PROGRESS NOTE    Steven Ward  ONG:[id number] DOB: [birth year] DOA: 02/22/2022 PCP: [name], MD  Chief Complaint  Patient presents with   Weakness   Fatigue    Brief Narrative:  Steven Ward is Steven Ward 88 y.o. male with medical history significant of paroxysmal Steven Ward-fib not on anticoagulation, hypertension, hypothyroidism, Parkinson's disease, lumbar spondylosis, type 2 diabetes, hyperlipidemia, PVD, CKD stage IIIb, remote history of colon cancer, BPH, pancreatic insufficiency, gallstones, GERD. Patient presented secondary to generalized weakness and found to have evidence of atrial fibrillation, SBO and acute infarct. General surgery consulted; NPO with NG tube placed. Cardiology consulted and patient's rate managed with beta blocker. Neurology consulted for stroke. SBO persistent and likely partial. PICC ordered for TPN starting 6/13.    Assessment & Plan:   Principal Problem:   Paroxysmal atrial fibrillation (HCC) Active Problems:   SBO (small bowel obstruction) (HCC)   Hypokalemia   Hypomagnesemia   Hypophosphatemia   Acute kidney injury superimposed on chronic kidney disease (HCC)   Metabolic acidosis   Acute CVA (cerebrovascular accident) ([place])   Generalized weakness   Essential hypertension   Pneumonia   T2DM (type 2 diabetes mellitus) ([place])   PD (Parkinson's disease) ([place])   Hypothyroidism   BPH (benign prostatic hyperplasia)   Decubitus ulcer   Compression fracture of body of thoracic vertebra (HCC)   Pleural plaque   Pulmonary fibrosis (HCC)   Right lower lobe lung mass   CKD stage 3 due to type 2 diabetes mellitus (HCC)   Nausea & vomiting   Stage 3b chronic kidney disease (CKD) (HCC)   Assessment and Plan: * Paroxysmal atrial fibrillation ([place]) Rate controlled. Cardiology consulted. Transthoracic Echocardiogram ordered and significant for severely dilated left atrium with normal LVEF. Anticoagulation deferred secondary to acute infarct, per  cardiology. Started on metoprolol PO -Cardiology recommendations: Metoprolol 12.5 mg BID -eliquis  SBO (small bowel obstruction) ([place]) KUB 6/13 with contrast throughout colon Improvement in small bowel gas pattern, few persistent gas filled dilated loops NGT removed, regular diet, ADAT per surgery  Continue PICC/TNA for now -> d/c TPN  Appreciate assistance  Hypokalemia Will follow, supplement as needed Goal >4   Hypophosphatemia Replace and follow  Hypomagnesemia Goal >2 Follow, supplement prn  Acute kidney injury superimposed on chronic kidney disease ([place]) Baseline appears to be around 2.2? Improved with IVF Peaked around 2.4ish Currently lower than baseline, trend  Metabolic acidosis Mild, follow  Acute CVA (cerebrovascular accident) ([place]) Acute punctate infarct noted on MRI located within the right cerebellar hemisphere in addition to possible subacute infarct of right frontal lobe. LDL of 34. Hemoglobin A1C of 7.7%. Transthoracic Echocardiogram with normal LVEF of 55-60% with no evidence of atrial level shunt. -Neurology recommendations: Aspirin 81 mg, Eliquis. Outpatient follow-up. Signed off. -PT/OT recommending CIR, pending at this time -SLP following    Generalized weakness CIR recommended by therapy  Essential hypertension On thiazide and loop outpatient - hold Continue metoprolol (started inpatient)  Pneumonia S/p treatment with ceftriaxone/flagyl for pneumonia  T2DM (type 2 diabetes mellitus) ([place]) SSI A1c 7.9 Insulin per pharmacy  PD (Parkinson's disease) ([place]) sinemet  Hypothyroidism synthroid  BPH (benign prostatic hyperplasia) flomax  Right lower lobe lung mass PCP and/or pulm follow up outpatient Needs repeat imaging within 3 months  Pulmonary fibrosis ([place]) pulm f/u outpatient  Pleural plaque Follow with pulm outpatient  Compression fracture of body of thoracic vertebra ([place]) T5 and T12, noted, follow  Decubitus  ulcer Pressure Injury 02/25/22 Coccyx  Unstageable - Full thickness tissue loss in which the base of the injury is covered by slough (yellow, tan, gray, green or brown) and/or eschar (tan, brown or black) in the wound bed. (Active)  02/25/22 2100  Location: Coccyx  Location Orientation:   Staging: Unstageable - Full thickness tissue loss in which the base of the injury is covered by slough (yellow, tan, gray, green or brown) and/or eschar (tan, brown or black) in the wound bed.  Wound Description (Comments):   Present on Admission: Yes          DVT prophylaxis: eliquis Code Status: dnr Family Communication: none Disposition:   Status is: Inpatient Remains inpatient appropriate because: pending further improvement   Consultants:  Surgery Cards neuro  Procedures:  Carotid US Summary:  Right Carotid: Velocities in the right ICA are consistent with Steven Duddy 1-39%  stenosis.   Left Carotid: Velocities in the left ICA are consistent with Steven Ward 1-39%  stenosis.   Vertebrals:  Bilateral vertebral arteries demonstrate antegrade flow.  Subclavians: Normal flow hemodynamics were seen in bilateral subclavian               arteries.   Echo IMPRESSIONS     1. Left ventricular ejection fraction, by estimation, is 55 to 60%. The  left ventricle has normal function. The left ventricle has no regional  wall motion abnormalities. There is mild concentric left ventricular  hypertrophy. Left ventricular diastolic  function could not be evaluated.   2. Right ventricular systolic function is normal. The right ventricular  size is normal. There is normal pulmonary artery systolic pressure.   3. Left atrial size was severely dilated.   4. The mitral valve is normal in structure. Trivial mitral valve  regurgitation. No evidence of mitral stenosis.   5. The aortic valve is calcified. There is mild calcification of the  aortic valve. There is mild thickening of the aortic valve. Aortic valve   regurgitation is not visualized. No aortic stenosis is present.   6. The inferior vena cava is normal in size with greater than 50%  respiratory variability, suggesting right atrial pressure of 3 mmHg.   Antimicrobials:  Anti-infectives (From admission, onward)    Start     Dose/Rate Route Frequency Ordered Stop   02/23/22 0000  cefTRIAXone (ROCEPHIN) 2 g in sodium chloride 0.9 % 100 mL IVPB  Status:  Discontinued        2 g 200 mL/hr over 30 Minutes Intravenous Daily at 10 pm 02/22/22 2346 02/28/22 1142   02/23/22 0000  metroNIDAZOLE (FLAGYL) IVPB 500 mg  Status:  Discontinued        500 mg 100 mL/hr over 60 Minutes Intravenous 2 times daily 02/22/22 2346 02/23/22 1802       Subjective: No complaints, sleeping on my entry - awakens easily   Objective: Vitals:   03/05/22 0347 03/05/22 1109 03/05/22 2137 03/05/22 2147  BP: (!) 122/55 (!) 113/50 137/69 137/65  Pulse: 63 74  83  Resp: 20 16 (!) 21 (!) 22  Temp: 98.1 F (36.7 C) 97.9 F (36.6 C) 98.3 F (36.8 C)   TempSrc: Oral Oral Oral   SpO2: 98% 99% 100% 97%  Weight: 79.4 kg     Height:        Intake/Output Summary (Last 24 hours) at 03/06/2022 1642 Last data filed at 03/06/2022 0359 Gross per 24 hour  Intake 600 ml  Output 650 ml  Net -50 ml   Filed Weights   03/03/22  0100 03/04/22 0328 03/05/22 0347  Weight: 77.5 kg 79.9 kg 79.4 kg    Examination:  General: No acute distress. Cardiovascular: RRR Lungs: unlabored Abdomen: Soft, nontender, nondistended  Neurological: Alert and oriented 3. Moves all extremities 4. Extremities: No clubbing or cyanosis. No edema.   Data Reviewed: I have personally reviewed following labs and imaging studies  CBC: Recent Labs  Lab 03/01/22 0420 03/02/22 0530 03/03/22 0314 03/05/22 0353 03/06/22 0403  WBC 10.4 10.4 11.8* 8.0 8.3  NEUTROABS  --  8.4* 9.7* 6.0 6.1  HGB 10.2* 9.6* 9.8* 9.4* 8.7*  HCT 29.9* 28.0* 28.9* 27.6* 26.2*  MCV 97.7 96.2 97.0 96.2 98.5  PLT  109* 102* 104* 149* 161    Basic Metabolic Panel: Recent Labs  Lab 03/02/22 0530 03/03/22 0314 03/04/22 0224 03/05/22 0353 03/06/22 0403  NA 137 137 132* 137 137  K 3.9 3.7 4.2 4.6 3.9  CL 109 109 104 107 108  CO2 21* 22 18* 23 21*  GLUCOSE 240* 82 215* 190* 111*  BUN 38* 41* 41* 43* 42*  CREATININE 1.59* 1.61* 1.50* 1.67* 1.79*  CALCIUM 7.8* 8.0* 7.8* 8.0* 7.6*  MG 1.9 2.2 2.0 1.9 1.7  PHOS 1.8* 2.4* 3.7 3.6 3.5    GFR: Estimated Creatinine Clearance: 23.9 mL/min (Syriah Delisi) (by C-G formula based on SCr of 1.79 mg/dL (H)).  Liver Function Tests: Recent Labs  Lab 03/01/22 0420 03/02/22 0530 03/03/22 0314 03/05/22 0353 03/06/22 0403  AST '16 17 24 19 19  '$ ALT 5 6 '5 6 5  '$ ALKPHOS 45 47 50 62 52  BILITOT 0.8 0.6 0.7 0.7 0.7  PROT 4.6* 4.4* 4.8* 5.1* 5.1*  ALBUMIN 1.8* 1.7* 1.8* 1.8* 1.7*    CBG: Recent Labs  Lab 03/05/22 1541 03/05/22 2144 03/06/22 0613 03/06/22 1155 03/06/22 1633  GLUCAP 182* 73 114* 180* 137*     No results found for this or any previous visit (from the past 240 hour(s)).       Radiology Studies: No results found.      Scheduled Meds:  apixaban  2.5 mg Oral BID   aspirin  81 mg Oral Daily   Carbidopa-Levodopa ER  1 tablet Oral QPM   Carbidopa-Levodopa ER  2 tablet Oral BID   Chlorhexidine Gluconate Cloth  6 each Topical Daily   feeding supplement  237 mL Oral BID BM   insulin aspart  0-15 Units Subcutaneous TID WC   insulin aspart  0-5 Units Subcutaneous QHS   insulin aspart  2 Units Subcutaneous TID WC   leptospermum manuka honey  1 application  Topical Daily   levothyroxine  25 mcg Oral Daily   metoprolol tartrate  12.5 mg Oral BID   sodium chloride flush  10-40 mL Intracatheter Q12H   tamsulosin  0.4 mg Oral Daily   Continuous Infusions:     LOS: 11 days    Time spent: over 30 min    Steven Helper, MD [facility]   To contact the attending provider between 7A-7P or the covering provider during after  hours 7P-7A, please log into the web site [email or web address] and access using universal [place] password for that web site. If you do not have the password, please call the hospital operator.  03/06/2022, 4:42 PM

## 2022-03-06 NOTE — Progress Notes (Signed)
Inpatient Rehab Coordinator Note:  I spoke with pt's son, [name], to discuss CIR recommendations and goals/expectations of CIR stay.  We reviewed 3 hrs/day of therapy, physician follow up, and average length of stay 2 weeks (dependent upon progress) with goals of supervision to min assist.  [name] reports that both he and his wife work from home (wife works for [employer]) and are available to provided expected level of assist.  [name] reports pt was very independent at baseline, mobilizing with [place] and able to negotiate stairs and complete ADLs.  I reviewed insurance auth process and I will start that request today.  Will follow for timing of potential admit pending insurance approval and bed availability.   Shann Medal, PT, DPT Admissions Coordinator [phone] 03/06/22  11:08 AM

## 2022-03-06 NOTE — Progress Notes (Signed)
Patient appeared to be in vfib on the monitor. Assessed patient to find right arm experiencing usual tremors while placed on top of RL lead. Apparent vfib resolved on repositioning.

## 2022-03-07 DIAGNOSIS — K5900 Constipation, unspecified: Secondary | ICD-10-CM

## 2022-03-07 DIAGNOSIS — I480 Paroxysmal atrial fibrillation: Secondary | ICD-10-CM | POA: Diagnosis not present

## 2022-03-07 LAB — CBC
HCT: 28.2 % — ABNORMAL LOW (ref 39.0–52.0)
Hemoglobin: 9.6 g/dL — ABNORMAL LOW (ref 13.0–17.0)
MCH: 33.3 pg (ref 26.0–34.0)
MCHC: 34 g/dL (ref 30.0–36.0)
MCV: 97.9 fL (ref 80.0–100.0)
Platelets: 217 10*3/uL (ref 150–400)
RBC: 2.88 MIL/uL — ABNORMAL LOW (ref 4.22–5.81)
RDW: 15.4 % (ref 11.5–15.5)
WBC: 7.5 10*3/uL (ref 4.0–10.5)
nRBC: 0 % (ref 0.0–0.2)

## 2022-03-07 LAB — GLUCOSE, CAPILLARY
Glucose-Capillary: 123 mg/dL — ABNORMAL HIGH (ref 70–99)
Glucose-Capillary: 124 mg/dL — ABNORMAL HIGH (ref 70–99)
Glucose-Capillary: 165 mg/dL — ABNORMAL HIGH (ref 70–99)
Glucose-Capillary: 165 mg/dL — ABNORMAL HIGH (ref 70–99)
Glucose-Capillary: 96 mg/dL (ref 70–99)

## 2022-03-07 NOTE — Progress Notes (Signed)
Mobility Specialist Progress Note:   03/07/22 0934  Mobility  Activity Ambulated with assistance in room;Transferred to/from [facility]  Level of Assistance Minimal assist, patient does 75% or more  Assistive Device Front wheel walker;BSC  Distance Ambulated (ft) 2 ft  Activity Response Tolerated well  $Mobility charge 1 Mobility   Pt received in bed willing to participate in mobility. No complaints of pain. Left on BSC and was instructed to hit call button when finished, NT notified.   [facility] Maisey Deandrade Mobility Specialist

## 2022-03-07 NOTE — Progress Notes (Signed)
Mobility Specialist Progress Note:   03/07/22 1744  Mobility  Activity Transferred from chair to bed  Level of Assistance Minimal assist, patient does 75% or more  Assistive Device Front wheel walker  Distance Ambulated (ft) 4 ft  Activity Response Tolerated well  $Mobility charge 1 Mobility   Pt received in chair asking to go to bed. No complaints of pain. MinA to stand. Left in bed with call bell in reach and all needs met.   [facility] Diamone Whistler Mobility Specialist

## 2022-03-07 NOTE — Progress Notes (Signed)
Inpatient Rehab Admissions Coordinator:   I received insurance approval for CIR.  I do not have a bed available for this patient to admit today.  I let pt/family know and I will follow for admission pending bed availability in the next few days.   Shann Medal, PT, DPT Admissions Coordinator [phone] 03/07/22  1:29 PM

## 2022-03-07 NOTE — Care Management Important Message (Signed)
Important Message  Patient Details  Name: Steven Ward MRN: [id number] Date of Birth: 09-20-1924   Medicare Important Message Given:  Yes     [name] 03/07/2022, 9:07 AM

## 2022-03-07 NOTE — Progress Notes (Signed)
PROGRESS NOTE    Steven Ward  JAS:[id number] DOB: [birth year] DOA: 02/22/2022 PCP: [name], MD  Chief Complaint  Patient presents with   Weakness   Fatigue    Brief Narrative:  BOGDAN Ward is Steven Ward 88 y.o. male with medical history significant of paroxysmal Masey Scheiber-fib not on anticoagulation, hypertension, hypothyroidism, Parkinson's disease, lumbar spondylosis, type 2 diabetes, hyperlipidemia, PVD, CKD stage IIIb, remote history of colon cancer, BPH, pancreatic insufficiency, gallstones, GERD. Patient presented secondary to generalized weakness and found to have evidence of atrial fibrillation, SBO and acute infarct. General surgery consulted; NPO with NG tube placed. Cardiology consulted and patient's rate managed with beta blocker. Neurology consulted for stroke. SBO persistent and likely partial. PICC ordered for TPN starting 6/13.  Now tolerating regular diet and general surgery has signed off.  Currently awaiting CIR.  See below for additional details     Assessment & Plan:   Principal Problem:   Paroxysmal atrial fibrillation (HCC) Active Problems:   SBO (small bowel obstruction) (HCC)   Hypokalemia   Hypomagnesemia   Hypophosphatemia   Acute kidney injury superimposed on chronic kidney disease (HCC)   Metabolic acidosis   Acute CVA (cerebrovascular accident) ([place])   Generalized weakness   Essential hypertension   Pneumonia   T2DM (type 2 diabetes mellitus) ([place])   PD (Parkinson's disease) ([place])   Hypothyroidism   BPH (benign prostatic hyperplasia)   Decubitus ulcer   Compression fracture of body of thoracic vertebra (HCC)   Pleural plaque   Pulmonary fibrosis (HCC)   Right lower lobe lung mass   Constipation   CKD stage 3 due to type 2 diabetes mellitus (HCC)   Nausea & vomiting   Stage 3b chronic kidney disease (CKD) (HCC)   Assessment and Plan: * Paroxysmal atrial fibrillation ([place]) Rate controlled. Cardiology consulted. Transthoracic Echocardiogram  ordered and significant for severely dilated left atrium with normal LVEF. Anticoagulation deferred secondary to acute infarct, per cardiology. Started on metoprolol PO -Cardiology recommendations: Metoprolol 12.5 mg BID -eliquis  SBO (small bowel obstruction) ([place]) KUB 6/13 with contrast throughout colon Improvement in small bowel gas pattern, few persistent gas filled dilated loops NGT removed, regular diet, ADAT per surgery  Continue PICC/TNA for now -> d/c TPN  Appreciate assistance  Hypokalemia Will follow, supplement as needed Goal >4   Hypophosphatemia Replace and follow  Hypomagnesemia Goal >2 Follow, supplement prn  Acute kidney injury superimposed on chronic kidney disease ([place]) Baseline appears to be around 2.2? Improved with IVF Peaked around 2.4ish Currently lower than baseline, trend  Metabolic acidosis Mild, follow  Acute CVA (cerebrovascular accident) ([place]) Acute punctate infarct noted on MRI located within the right cerebellar hemisphere in addition to possible subacute infarct of right frontal lobe. LDL of 34. Hemoglobin A1C of 7.7%. Transthoracic Echocardiogram with normal LVEF of 55-60% with no evidence of atrial level shunt. -Neurology recommendations: Aspirin 81 mg, Eliquis. Outpatient follow-up. Signed off. -PT/OT recommending CIR, pending at this time -SLP following    Generalized weakness CIR recommended by therapy  Essential hypertension On thiazide and loop outpatient - hold Continue metoprolol (started inpatient)  Pneumonia S/p treatment with ceftriaxone/flagyl for pneumonia  T2DM (type 2 diabetes mellitus) ([place]) SSI A1c 7.9 Insulin per pharmacy  PD (Parkinson's disease) ([place]) sinemet  Hypothyroidism synthroid  BPH (benign prostatic hyperplasia) flomax  Constipation They prefer to hold creon at this time  Right lower lobe lung mass PCP and/or pulm follow up outpatient Needs repeat imaging within 3 months  Pulmonary  fibrosis ([place]) pulm f/u outpatient  Pleural plaque Follow with pulm outpatient  Compression fracture of body of thoracic vertebra (HCC) T5 and T12, noted, follow Needs outpatient follow up  Decubitus ulcer Pressure Injury 02/25/22 Coccyx Unstageable - Full thickness tissue loss in which the base of the injury is covered by slough (yellow, tan, gray, green or brown) and/or eschar (tan, brown or black) in the wound bed. (Active)  02/25/22 2100  Location: Coccyx  Location Orientation:   Staging: Unstageable - Full thickness tissue loss in which the base of the injury is covered by slough (yellow, tan, gray, green or brown) and/or eschar (tan, brown or black) in the wound bed.  Wound Description (Comments):   Present on Admission: Yes          DVT prophylaxis: eliquis Code Status: dnr Family Communication: none Disposition:   Status is: Inpatient Remains inpatient appropriate because: pending further improvement   Consultants:  Surgery Cards neuro  Procedures:  Carotid US Summary:  Right Carotid: Velocities in the right ICA are consistent with Aron Inge 1-39%  stenosis.   Left Carotid: Velocities in the left ICA are consistent with Tanayah Squitieri 1-39%  stenosis.   Vertebrals:  Bilateral vertebral arteries demonstrate antegrade flow.  Subclavians: Normal flow hemodynamics were seen in bilateral subclavian               arteries.   Echo IMPRESSIONS     1. Left ventricular ejection fraction, by estimation, is 55 to 60%. The  left ventricle has normal function. The left ventricle has no regional  wall motion abnormalities. There is mild concentric left ventricular  hypertrophy. Left ventricular diastolic  function could not be evaluated.   2. Right ventricular systolic function is normal. The right ventricular  size is normal. There is normal pulmonary artery systolic pressure.   3. Left atrial size was severely dilated.   4. The mitral valve is normal in structure. Trivial mitral  valve  regurgitation. No evidence of mitral stenosis.   5. The aortic valve is calcified. There is mild calcification of the  aortic valve. There is mild thickening of the aortic valve. Aortic valve  regurgitation is not visualized. No aortic stenosis is present.   6. The inferior vena cava is normal in size with greater than 50%  respiratory variability, suggesting right atrial pressure of 3 mmHg.   Antimicrobials:  Anti-infectives (From admission, onward)    Start     Dose/Rate Route Frequency Ordered Stop   02/23/22 0000  cefTRIAXone (ROCEPHIN) 2 g in sodium chloride 0.9 % 100 mL IVPB  Status:  Discontinued        2 g 200 mL/hr over 30 Minutes Intravenous Daily at 10 pm 02/22/22 2346 02/28/22 1142   02/23/22 0000  metroNIDAZOLE (FLAGYL) IVPB 500 mg  Status:  Discontinued        500 mg 100 mL/hr over 60 Minutes Intravenous 2 times daily 02/22/22 2346 02/23/22 1802       Subjective: Asking for help, repositioning Called for assistance of RN/NT No other complaints  Objective: Vitals:   03/07/22 0008 03/07/22 0408 03/07/22 0423 03/07/22 1138  BP: (!) 124/59 (!) 108/50  (!) 124/50  Pulse: 67 92  78  Resp: 16 20    Temp: 98 F (36.7 C) 98.4 F (36.9 C)    TempSrc: Oral Oral    SpO2: 99% 96%    Weight: 81.7 kg  79.6 kg   Height:  Intake/Output Summary (Last 24 hours) at 03/07/2022 1942 Last data filed at 03/07/2022 1700 Gross per 24 hour  Intake 930 ml  Output 600 ml  Net 330 ml   Filed Weights   03/05/22 0347 03/07/22 0008 03/07/22 0423  Weight: 79.4 kg 81.7 kg 79.6 kg    Examination:  General: No acute distress. Trying to eat, needs help sitting up on edge of bed. Cardiovascular: RRR Lungs: unlabored Abdomen: Soft, nontender, nondistended Neurological: Alert and oriented 3. Moves all extremities 4 with equal strength. Cranial nerves II through XII grossly intact. Extremities: No clubbing or cyanosis. No edema.  Data Reviewed: I have personally  reviewed following labs and imaging studies  CBC: Recent Labs  Lab 03/02/22 0530 03/03/22 0314 03/05/22 0353 03/06/22 0403 03/07/22 0839  WBC 10.4 11.8* 8.0 8.3 7.5  NEUTROABS 8.4* 9.7* 6.0 6.1  --   HGB 9.6* 9.8* 9.4* 8.7* 9.6*  HCT 28.0* 28.9* 27.6* 26.2* 28.2*  MCV 96.2 97.0 96.2 98.5 97.9  PLT 102* 104* 149* 176 357    Basic Metabolic Panel: Recent Labs  Lab 03/02/22 0530 03/03/22 0314 03/04/22 0224 03/05/22 0353 03/06/22 0403  NA 137 137 132* 137 137  K 3.9 3.7 4.2 4.6 3.9  CL 109 109 104 107 108  CO2 21* 22 18* 23 21*  GLUCOSE 240* 82 215* 190* 111*  BUN 38* 41* 41* 43* 42*  CREATININE 1.59* 1.61* 1.50* 1.67* 1.79*  CALCIUM 7.8* 8.0* 7.8* 8.0* 7.6*  MG 1.9 2.2 2.0 1.9 1.7  PHOS 1.8* 2.4* 3.7 3.6 3.5    GFR: Estimated Creatinine Clearance: 23.9 mL/min (Ardine Iacovelli) (by C-G formula based on SCr of 1.79 mg/dL (H)).  Liver Function Tests: Recent Labs  Lab 03/01/22 0420 03/02/22 0530 03/03/22 0314 03/05/22 0353 03/06/22 0403  AST '16 17 24 19 19  '$ ALT 5 6 '5 6 5  '$ ALKPHOS 45 47 50 62 52  BILITOT 0.8 0.6 0.7 0.7 0.7  PROT 4.6* 4.4* 4.8* 5.1* 5.1*  ALBUMIN 1.8* 1.7* 1.8* 1.8* 1.7*    CBG: Recent Labs  Lab 03/06/22 2238 03/07/22 0007 03/07/22 0610 03/07/22 1149 03/07/22 1546  GLUCAP 132* 165* 165* 123* 124*     No results found for this or any previous visit (from the past 240 hour(s)).       Radiology Studies: No results found.      Scheduled Meds:  apixaban  2.5 mg Oral BID   aspirin  81 mg Oral Daily   Carbidopa-Levodopa ER  1 tablet Oral QPM   Carbidopa-Levodopa ER  2 tablet Oral BID   Chlorhexidine Gluconate Cloth  6 each Topical Daily   feeding supplement  237 mL Oral BID BM   insulin aspart  0-15 Units Subcutaneous TID WC   insulin aspart  0-5 Units Subcutaneous QHS   insulin aspart  2 Units Subcutaneous TID WC   leptospermum manuka honey  1 application  Topical Daily   levothyroxine  25 mcg Oral Daily   magnesium oxide  400 mg  Oral BID   metoprolol tartrate  12.5 mg Oral BID   sodium chloride flush  10-40 mL Intracatheter Q12H   tamsulosin  0.4 mg Oral Daily   Continuous Infusions:     LOS: 12 days    Time spent: over 30 min    Fayrene Helper, MD [facility]   To contact the attending provider between 7A-7P or the covering provider during after hours 7P-7A, please log into the web site [email or web address] and access using  universal [place] password for that web site. If you do not have the password, please call the hospital operator.  03/07/2022, 7:42 PM

## 2022-03-07 NOTE — Progress Notes (Signed)
Physical Therapy Treatment Patient Details Name: Steven Ward MRN: [id number] DOB: 20-Jun-1925 Today's Date: 03/07/2022   History of Present Illness The pt is a 88 yo male presenting 6/7 with weakness, fatigue, poor appetite, and fall while walking to the bathroom on day of admission. Pt found to be in afib with frequent PVCs (new after ablation 20 years ago). CT revealed mid/distal SBO, NG tube placed 6/7. MRI revealed R cerebellar infarct. PMH includes: DM II, HLD, PVD, PD, PAF, CKD III, colon cancer, and BPH.    PT Comments    Pt received supine and agreeable to session with continued progress toward acute goals. Pt able to complete all bed mobility with min assist and able to transfer to standing with min assist down to min guard with repeated task practice from recliner to RW. Pt with increased tolerance for ambulation this session with mod assist and close chair follow for safety. Pt with good tolerance for LE therex for increased strength and endurance. Pt agreeable to time up in chair at end of session. Current plan remains appropriate to address deficits and maximize functional independence and decrease caregiver burden. Pt continues to benefit from skilled PT services to progress toward functional mobility goals.    Recommendations for follow up therapy are one component of a multi-disciplinary discharge planning process, led by the attending physician.  Recommendations may be updated based on patient status, additional functional criteria and insurance authorization.  Follow Up Recommendations  Acute inpatient rehab (3hours/day)     Assistance Recommended at Discharge Frequent or constant Supervision/Assistance  Patient can return home with the following Two people to help with walking and/or transfers;A lot of help with bathing/dressing/bathroom;Assistance with cooking/housework;Direct supervision/assist for medications management;Direct supervision/assist for financial  management;Assist for transportation;Help with stairs or ramp for entrance   Equipment Recommendations  Other (comment) (allow CIR to determine)    Recommendations for Other Services Rehab consult     Precautions / Restrictions Precautions Precautions: Fall Restrictions Weight Bearing Restrictions: No     Mobility  Bed Mobility Overal bed mobility: Needs Assistance Bed Mobility: Supine to Sit     Supine to sit: Min assist, HOB elevated     General bed mobility comments: min assist to elevate trunk and scoot out to EOB    Transfers Overall transfer level: Needs assistance Equipment used: Rolling walker (2 wheels) Transfers: Sit to/from Stand, Bed to chair/wheelchair/BSC Sit to Stand: Supervision, Min guard           General transfer comment: min assist for first stand from EOB, able to stand x4 from recliner in sucession with min guard with RW and cues each rep for hand placement    Ambulation/Gait Ambulation/Gait assistance: Min assist Gait Distance (Feet): 75 Feet Assistive device: Rolling walker (2 wheels) Gait Pattern/deviations: Decreased step length - right, Decreased step length - left, Shuffle, Leaning posteriorly, Narrow base of support Gait velocity: slowed     General Gait Details: mod a to steady and for chair follow, vc for proximity to RW and increased step length   Stairs             Wheelchair Mobility    Modified Rankin (Stroke Patients Only)       Balance Overall balance assessment: Needs assistance Sitting-balance support: No upper extremity supported, Feet supported Sitting balance-Leahy Scale: Good Sitting balance - Comments: able to sit on EOB without assistance Postural control: Posterior lean Standing balance support: During functional activity, Single extremity supported Standing balance-Leahy Scale: Poor  Standing balance comment: reliance on BUE support                            Cognition  Arousal/Alertness: Awake/alert Behavior During Therapy: WFL for tasks assessed/performed                                   General Comments: required cues for safety        Exercises General Exercises - Lower Extremity Long Arc Quad: AROM, Right, Left, 10 reps, Seated (with 3 second hold) Hip Flexion/Marching: AROM, Right, Left, 10 reps, Seated Heel Raises: AROM, Right, Left, 10 reps, Seated    General Comments        Pertinent Vitals/Pain Pain Assessment Pain Assessment: No/denies pain    Home Living                          Prior Function            PT Goals (current goals can now be found in the care plan section) Acute Rehab PT Goals PT Goal Formulation: Patient unable to participate in goal setting Time For Goal Achievement: 03/09/22    Frequency    Min 4X/week      PT Plan Current plan remains appropriate    Co-evaluation              AM-PAC PT "6 Clicks" Mobility   Outcome Measure  Help needed turning from your back to your side while in a flat bed without using bedrails?: A Lot Help needed moving from lying on your back to sitting on the side of a flat bed without using bedrails?: A Lot Help needed moving to and from a bed to a chair (including a wheelchair)?: A Lot Help needed standing up from a chair using your arms (e.g., wheelchair or bedside chair)?: A Lot Help needed to walk in hospital room?: A Lot Help needed climbing 3-5 steps with a railing? : Total 6 Click Score: 11    End of Session Equipment Utilized During Treatment: Gait belt Activity Tolerance: Patient tolerated treatment well Patient left: in chair;with call bell/phone within reach Nurse Communication: Mobility status PT Visit Diagnosis: Muscle weakness (generalized) (M62.81)     Time: 3013-1438 PT Time Calculation (min) (ACUTE ONLY): 24 min  Charges:  $Gait Training: 8-22 mins $Therapeutic Exercise: 8-22 mins                     Tomicka Lover R.  PTA Acute Rehabilitation Services Office: [place] 03/07/2022, 1:13 PM

## 2022-03-07 NOTE — Assessment & Plan Note (Signed)
They prefer to hold creon at this time

## 2022-03-08 DIAGNOSIS — E876 Hypokalemia: Secondary | ICD-10-CM

## 2022-03-08 DIAGNOSIS — I639 Cerebral infarction, unspecified: Secondary | ICD-10-CM | POA: Diagnosis not present

## 2022-03-08 DIAGNOSIS — I480 Paroxysmal atrial fibrillation: Secondary | ICD-10-CM | POA: Diagnosis not present

## 2022-03-08 DIAGNOSIS — E1122 Type 2 diabetes mellitus with diabetic chronic kidney disease: Secondary | ICD-10-CM | POA: Diagnosis not present

## 2022-03-08 DIAGNOSIS — N179 Acute kidney failure, unspecified: Secondary | ICD-10-CM | POA: Diagnosis not present

## 2022-03-08 LAB — GLUCOSE, CAPILLARY
Glucose-Capillary: 135 mg/dL — ABNORMAL HIGH (ref 70–99)
Glucose-Capillary: 90 mg/dL (ref 70–99)
Glucose-Capillary: 88 mg/dL (ref 70–99)
Glucose-Capillary: 159 mg/dL — ABNORMAL HIGH (ref 70–99)

## 2022-03-08 LAB — BASIC METABOLIC PANEL
Anion gap: 5 (ref 5–15)
BUN: 35 mg/dL — ABNORMAL HIGH (ref 8–23)
CO2: 22 mmol/L (ref 22–32)
Calcium: 7.7 mg/dL — ABNORMAL LOW (ref 8.9–10.3)
Chloride: 112 mmol/L — ABNORMAL HIGH (ref 98–111)
Creatinine, Ser: 1.62 mg/dL — ABNORMAL HIGH (ref 0.61–1.24)
GFR, Estimated: 39 mL/min — ABNORMAL LOW (ref >60)
Glucose, Bld: 136 mg/dL — ABNORMAL HIGH (ref 70–99)
Potassium: 4.1 mmol/L (ref 3.5–5.1)
Sodium: 139 mmol/L (ref 135–145)

## 2022-03-08 NOTE — Progress Notes (Signed)
Inpatient Rehab Admissions Coordinator:   I do not have a bed available for this patient to admit today.  I let pt/family know and I will follow for admission pending bed availability in the next few days.   Shann Medal, PT, DPT Admissions Coordinator [phone] 03/08/22  2:41 PM

## 2022-03-08 NOTE — Progress Notes (Signed)
PROGRESS NOTE    KEANE MARTELLI  KZS:[id number] DOB: 14-May-1925 DOA: 02/22/2022 PCP: [name], MD   Brief Narrative: Steven Ward is a 88 y.o. male with medical history significant of paroxysmal A-fib not on anticoagulation, hypertension, hypothyroidism, Parkinson's disease, lumbar spondylosis, type 2 diabetes, hyperlipidemia, PVD, CKD stage IIIb, remote history of colon cancer, BPH, pancreatic insufficiency, gallstones, GERD. Patient presented secondary to generalized weakness and found to have evidence of atrial fibrillation, SBO and acute infarct. General surgery consulted; NPO with NG tube placed. Cardiology consulted and patient's rate managed with beta blocker. Neurology consulted for stroke. SBO persistent and likely partial. PICC ordered for TPN starting 6/13. Diet advanced and TPN discontinued. SBO resolved. General surgery signed off.   Assessment and Plan:  Paroxysmal atrial fibrillation Rate controlled. Cardiology consulted. Transthoracic Echocardiogram ordered and significant for severely dilated left atrium with normal LVEF. Anticoagulation deferred secondary to acute infarct, per cardiology. Started on metoprolol PO -Cardiology recommendations: Metoprolol 12.5 mg BID -Continue Eliquis  Small bowel obstruction Noted on CT abdomen/pelvis on 6/7. General surgery consulted. Management with NG tube decompression. Obstruction resolved with conservative management.  Hypokalemia Resolved with potassium supplementation.  Hypomagnesemia -Magnesium supplementation. Magnesium improved. Goal magnesium of >2 -Daily magnesium  AKI on CKD stage IIIb Baseline creatinine of about 1.7-2. Creatinine of 2.47 on admission with peak of 2.5. Started on IV fluids. Back to baseline and has stabilized. Resolved.  Metabolic acidosis In setting of AKI on CKD. Mild. Resolved.  Acute CVA Acute punctate infarct noted on MRI located within the right cerebellar hemisphere in addition  to possible subacute infarct of right frontal lobe. LDL of 34. Hemoglobin A1C of 7.7%. Transthoracic Echocardiogram with normal LVEF of 55-60% with no evidence of atrial level shunt. Neurology recommendations for Aspirin 81 mg, Eliquis and outpatient follow-up. Signed off. PT/OT recommending CIR  Generalized weakness PT recommending CIR. Consult placed.  Primary hypertension Patient is on hydrochlorothiazide and Lasix as an outpatient. Started on metoprolol inpatient. Continue metoprolol  Right upper lobe opacities Concerning for possible infection. Asymptomatic at this time. Started on Ceftriaxone and Flagyl for CAP/aspiration pneumonia. Patient treated for 6 days of Ceftriaxone. Asymptomatic.  Diabetes mellitus, type 2 Hemoglobin A1C of 7.7%. Patient on glipizide.  Parkinson disease -Continue Sinemet CR  Hypothyroidism -Continue Synthroid  BPH -Continue tamsulosin  Chronic pancreatic insufficiency -Continue Creon  T5 and T12 vertebral compression fracture Incidental finding. Chronic.   Aortic atherosclerosis Noted on CT imaging.  Bilateral pleural plaques Pulmonary fibrosis Per CT read, consistent with asbestos related pleural disease. Follow-up with pulmonology outpatient.  3.5 cm RLL mass Newly noted. Rounded atelectasis vs pulmonary neoplasm. Recommendation for follow-up in 3 months  Pressure injury Right perineum, POA. -Wound care per nursing    DVT prophylaxis: SCDs Code Status:   Code Status: DNR Family Communication: None at bedside Disposition Plan: Discharge to CIR once bed is available   Consultants:  General surgery Cardiology Neurology  Procedures:  None  Antimicrobials: Ceftriaxone Flagyl    Subjective: No concerns this morning. No issues overnight.  Objective: BP (!) 129/59 (BP Location: Left Arm)   Pulse 71   Temp 98.2 F (36.8 C) (Oral)   Resp 18   Ht _0  (1.727 m)   Wt 80.6 kg   SpO2 97%   BMI 27.02 kg/m    Examination:  General exam: Appears calm and comfortable Respiratory system: Clear to auscultation. Respiratory effort normal. Cardiovascular system: S1 & S2 heard, RRR. No murmurs, rubs, gallops  or clicks. Gastrointestinal system: Abdomen is nondistended, soft and nontender. Normal bowel sounds heard. Central nervous system: Alert and oriented. No focal neurological deficits. Musculoskeletal: BLE edema. No calf tenderness Skin: No cyanosis. No rashes Psychiatry: Judgement and insight appear normal. Mood & affect appropriate.     Data Reviewed: I have personally reviewed following labs and imaging studies  CBC Lab Results  Component Value Date   WBC 7.5 03/07/2022   RBC 2.88 (L) 03/07/2022   HGB 9.6 (L) 03/07/2022   HCT 28.2 (L) 03/07/2022   MCV 97.9 03/07/2022   MCH 33.3 03/07/2022   PLT 217 03/07/2022   MCHC 34.0 03/07/2022   RDW 15.4 03/07/2022   LYMPHSABS 0.8 03/06/2022   MONOABS 1.1 (H) 03/06/2022   EOSABS 0.2 03/06/2022   BASOSABS 0.0 67/56/1254     Last metabolic panel Lab Results  Component Value Date   NA 139 03/08/2022   K 4.1 03/08/2022   CL 112 (H) 03/08/2022   CO2 22 03/08/2022   BUN 35 (H) 03/08/2022   CREATININE 1.62 (H) 03/08/2022   GLUCOSE 136 (H) 03/08/2022   GFRNONAA 39 (L) 03/08/2022   GFRAA  04/26/2009    >60        The eGFR has been calculated using the MDRD equation. This calculation has not been validated in all clinical situations. eGFR's persistently <60 mL/min signify possible Chronic Kidney Disease.   CALCIUM 7.7 (L) 03/08/2022   PHOS 3.5 03/06/2022   PROT 5.1 (L) 03/06/2022   ALBUMIN 1.7 (L) 03/06/2022   BILITOT 0.7 03/06/2022   ALKPHOS 52 03/06/2022   AST 19 03/06/2022   ALT 5 03/06/2022   ANIONGAP 5 03/08/2022    GFR: Estimated Creatinine Clearance: 26.4 mL/min (A) (by C-G formula based on SCr of 1.62 mg/dL (H)).  No results found for this or any previous visit (from the past 240 hour(s)).    Radiology  Studies: No results found.    LOS: 13 days    Cordelia Poche, MD [facility] 03/08/2022, 6:24 PM   If 7PM-7AM, please contact night-coverage [email or web address]

## 2022-03-08 NOTE — Progress Notes (Signed)
Mobility Specialist Progress Note:   03/08/22 1038  Mobility  Activity Ambulated with assistance in room;Transferred to/from [facility]  Level of Assistance Minimal assist, patient does 75% or more  Assistive Device Front wheel walker  Distance Ambulated (ft) 4 ft  Activity Response Tolerated well  $Mobility charge 1 Mobility   Pt received in bed asking to go to [facility]. No complaints of pain. MinA to stand. Left on BSC and was instructed to hit call button when finished.   [facility] Mobility Specialist

## 2022-03-08 NOTE — Progress Notes (Addendum)
Physical Therapy Treatment Patient Details Name: Steven Ward MRN: [id number] DOB: 1925/02/04 Today's Date: 03/08/2022   History of Present Illness The pt is a 88 yo male presenting 6/7 with weakness, fatigue, poor appetite, and fall while walking to the bathroom on day of admission. Pt found to be in afib with frequent PVCs (new after ablation 20 years ago). CT revealed mid/distal SBO, NG tube placed 6/7. MRI revealed R cerebellar infarct. PMH includes: DM II, HLD, PVD, PD, PAF, CKD III, colon cancer, and BPH.    PT Comments    Pt requring decreased assistance but as fatigue sets in he required increased assistance to return back to room and transfer safely to his recliner chair.  He motivated and would continue to benefit from aggressive rehab in a post acute setting to maximize functional gains before returning home.  Continue to strongly recommed CIR for placement.     Recommendations for follow up therapy are one component of a multi-disciplinary discharge planning process, led by the attending physician.  Recommendations may be updated based on patient status, additional functional criteria and insurance authorization.  Follow Up Recommendations  Acute inpatient rehab (3hours/day)     Assistance Recommended at Discharge Frequent or constant Supervision/Assistance  Patient can return home with the following     Equipment Recommendations  Other (comment) (TBD at next venue)    Recommendations for Other Services       Precautions / Restrictions Precautions Precautions: Fall Restrictions Weight Bearing Restrictions: No     Mobility  Bed Mobility               General bed mobility comments: Seated in recliner cues for hand placement to scoot forward and backward into recliner.    Transfers Overall transfer level: Needs assistance Equipment used: Rolling walker (2 wheels) Transfers: Sit to/from Stand Sit to Stand: Supervision           General transfer  comment: MOd cues for hand placement and forward weight shifting to and from seated surface.    Ambulation/Gait Ambulation/Gait assistance: Min guard Gait Distance (Feet): 100 Feet Assistive device: Rolling walker (2 wheels) Gait Pattern/deviations: Decreased step length - right, Decreased step length - left, Shuffle, Leaning posteriorly, Narrow base of support Gait velocity: slowed     General Gait Details: Cues for posture awareness and safe body position in RW when turning and backing.  Cues to increase B stride length as fatigue sets in.   Stairs             Wheelchair Mobility    Modified Rankin (Stroke Patients Only) Modified Rankin (Stroke Patients Only) Pre-Morbid Rankin Score: Moderately severe disability Modified Rankin: Moderately severe disability     Balance Overall balance assessment: Needs assistance Sitting-balance support: No upper extremity supported, Feet supported Sitting balance-Leahy Scale: Good       Standing balance-Leahy Scale: Poor Standing balance comment: reliance on BUE support                            Cognition Arousal/Alertness: Awake/alert Behavior During Therapy: WFL for tasks assessed/performed, Flat affect Overall Cognitive Status: No family/caregiver present to determine baseline cognitive functioning                                 General Comments: required cues for safety        Exercises  General Comments        Pertinent Vitals/Pain Pain Assessment Pain Assessment: No/denies pain    Home Living                          Prior Function            PT Goals (current goals can now be found in the care plan section) Acute Rehab PT Goals Patient Stated Goal: none stated Potential to Achieve Goals: Good Progress towards PT goals: Progressing toward goals    Frequency    Min 4X/week      PT Plan Current plan remains appropriate    Co-evaluation               AM-PAC PT "6 Clicks" Mobility   Outcome Measure  Help needed turning from your back to your side while in a flat bed without using bedrails?: A Lot Help needed moving from lying on your back to sitting on the side of a flat bed without using bedrails?: A Lot Help needed moving to and from a bed to a chair (including a wheelchair)?: A Lot Help needed standing up from a chair using your arms (e.g., wheelchair or bedside chair)?: A Lot Help needed to walk in hospital room?: A Lot Help needed climbing 3-5 steps with a railing? : A Lot 6 Click Score: 12    End of Session Equipment Utilized During Treatment: Gait belt Activity Tolerance: Patient tolerated treatment well Patient left: in chair;with call bell/phone within reach;with chair alarm set Nurse Communication: Mobility status PT Visit Diagnosis: Muscle weakness (generalized) (M62.81)     Time: 4193-7902 PT Time Calculation (min) (ACUTE ONLY): 15 min  Charges:  $Gait Training: 8-22 mins                     Erasmo Leventhal , PTA Acute Rehabilitation Services  Office [phone]    Mayia Megill Eli Hose 03/08/2022, 11:30 AM

## 2022-03-08 NOTE — PMR Pre-admission (Signed)
PMR Admission Coordinator Pre-Admission Assessment   Patient: Steven Ward is an 88 y.o., male MRN: [id number] DOB: 02/13/1925 Height: 5' 8" (172.7 cm) Weight: 81.4 kg   Insurance Information HMO:     PPO: yes     PCP:      IPA:      80/20:      OTHER:  PRIMARY: [employer]      Policy#: [id number]      Subscriber: pt CM Name: Tammy      Phone#: [phone]     Fax#: Epic Access Pre-Cert#: [id number] auth for CIR from Tammy with [employer], updates due every 7 days, they have EPIC access.       Employer:  Benefits:  Phone #: [phone]     Name:  Eff. Date: 09/18/21     Deduct: $0      Out of Pocket Max: $3200 (met $50)      Life Max: n/a CIR: $295/day for days 1-6      SNF: 20 full days Outpatient:      Co-Pay: $15/visit Home Health: 100%      Co-Pay:  DME: 80%     Co-Pay: 20% Providers: preferred network SECONDARY:       Policy#:      Phone#:    Financial Counselor:       Phone#:    The "Data Collection Information Summary" for patients in Inpatient Rehabilitation Facilities with attached "Privacy Act Statement-Health Care Records" was provided and verbally reviewed with: Patient and Family   Emergency Contact Information Contact Information       Name Relation Home Work Mobile    Tenenbaum,Frank Son   [phone]             Current Medical History  Patient Admitting Diagnosis: CVA, SBO   History of Present Illness: Pt is a 88 y/o male with PMH Of paroxysmal a-fib (not on anticoagulation at baseline), HTN, Parkinson's, DM, PVD, CKD IIIb, colon cancer, BPH, pancreatic insufficiency, and GERD, admitted to [facility] on 02/22/22 with complaints of generalized weakness.  ED workup revealed pt to be in A-fib with SBO and acute R cerebellar infarct.  Consulted for cardiology, general surgery, and neurology.  Cardiology ordered TEE which showed severely dilated left atrium with normal LVEF, anticoagulation initially deferred and tp started on metoprolol for rate control.  General  surgery consulted for SBO and pt underwent 2 rounds of SBO protocol with NGT for decompression.  Pt briefly on TPN, now tolerating regular diet and NGT d/c'd.  Neurology consulted for cerebellar infarct and recommended DAPT aspirin 81 mg and Eliquis with outpatient neurology follow up.  Therapy ongoing and pt was recommended for CIR due to functional decline.    Complete NIHSS TOTAL: 0   Patient's medical record from [facility] has been reviewed by the rehabilitation admission coordinator and physician.   Past Medical History      Past Medical History:  Diagnosis Date   ABNORMAL THYROID FUNCTION TESTS 01/17/2010   ALLERGIC RHINITIS 12/01/2008   Colon cancer (HCC)     COLONIC POLYPS, HX OF 12/01/2008   DIABETES MELLITUS, TYPE II 12/01/2008   EDEMA LEG 05/04/2009   ESSENTIAL HYPERTENSION 12/01/2008   Exocrine pancreatic insufficiency     GALLSTONES 04/07/2009   GERD 12/01/2008   Hay fever     SPONDYLOSIS, LUMBAR 12/01/2008      Has the patient had major surgery during 100 days prior to admission? No   Family History     family history includes Cancer in his mother and another family member; Diabetes in an other family member; Stroke in an other family member.   Current Medications   Current Facility-Administered Medications:    acetaminophen (TYLENOL) tablet 650 mg, 650 mg, Oral, Q6H PRN **OR** acetaminophen (TYLENOL) suppository 650 mg, 650 mg, Rectal, Q6H PRN, Rathore, Vasundhra, MD   apixaban (ELIQUIS) tablet 2.5 mg, 2.5 mg, Oral, BID, Pham, Minh Q, RPH-CPP, 2.5 mg at 03/09/22 0844   aspirin chewable tablet 81 mg, 81 mg, Oral, Daily, Wolfe, Denise A, NP, 81 mg at 03/09/22 0844   Carbidopa-Levodopa ER (SINEMET CR) 25-100 MG tablet controlled release 1 tablet, 1 tablet, Oral, QPM, Wolfe, Allison, MD, 1 tablet at 03/08/22 1720   Carbidopa-Levodopa ER (SINEMET CR) 25-100 MG tablet controlled release 2 tablet, 2 tablet, Oral, BID, Wolfe, Allison, MD, 2 tablet at 03/09/22 1240   feeding  supplement (ENSURE ENLIVE / ENSURE PLUS) liquid 237 mL, 237 mL, Oral, BID BM, Powell, A Caldwell Jr., MD, 237 mL at 03/09/22 1243   insulin aspart (novoLOG) injection 0-15 Units, 0-15 Units, Subcutaneous, TID WC, Chen, Lydia D, RPH, 3 Units at 03/09/22 1241   insulin aspart (novoLOG) injection 0-5 Units, 0-5 Units, Subcutaneous, QHS, Chen, Lydia D, RPH   insulin aspart (novoLOG) injection 2 Units, 2 Units, Subcutaneous, TID WC, Chen, Lydia D, RPH, 2 Units at 03/09/22 1242   leptospermum manuka honey (MEDIHONEY) paste 1 application , 1 application , Topical, Daily, Nettey, Ralph A, MD, 1 application  at 03/09/22 0854   levothyroxine (SYNTHROID) tablet 25 mcg, 25 mcg, Oral, Daily, Rathore, Vasundhra, MD, 25 mcg at 03/09/22 0651   lip balm (CARMEX) ointment, , Topical, PRN, Nettey, Ralph A, MD   magnesium oxide (MAG-OX) tablet 400 mg, 400 mg, Oral, BID, Powell, A Caldwell Jr., MD, 400 mg at 03/09/22 0844   metoprolol tartrate (LOPRESSOR) tablet 12.5 mg, 12.5 mg, Oral, BID, Rathore, Vasundhra, MD, 12.5 mg at 03/09/22 0844   ondansetron (ZOFRAN-ODT) disintegrating tablet 4 mg, 4 mg, Oral, Q8H PRN, Rathore, Vasundhra, MD, 4 mg at 02/25/22 2348   tamsulosin (FLOMAX) capsule 0.4 mg, 0.4 mg, Oral, Daily, Rathore, Vasundhra, MD, 0.4 mg at 03/09/22 0844   Patients Current Diet:  Diet Order                  Diet regular Room service appropriate? Yes; Fluid consistency: Thin  Diet effective now                         Precautions / Restrictions Precautions Precautions: Fall Precaution Comments: NG tube Restrictions Weight Bearing Restrictions: No    Has the patient had 2 or more falls or a fall with injury in the past year? Yes   Prior Activity Level Limited Community (1-2x/wk): mod I prior to admission with SPC, assist for iADLs, not driving   Prior Functional Level Self Care: Did the patient need help bathing, dressing, using the toilet or eating? Needed some help   Indoor Mobility: Did  the patient need assistance with walking from room to room (with or without device)? Independent   Stairs: Did the patient need assistance with internal or external stairs (with or without device)? Independent   Functional Cognition: Did the patient need help planning regular tasks such as shopping or remembering to take medications? Needed some help   Patient Information Are you of Hispanic, Latino/a,or Spanish origin?: A. No, not of Hispanic, Latino/a, or Spanish origin What is   your race?: A. White Do you need or want an interpreter to communicate with a doctor or health care staff?: 0. No   Patient's Response To:  Health Literacy and Transportation Is the patient able to respond to health literacy and transportation needs?: Yes Health Literacy - How often do you need to have someone help you when you read instructions, pamphlets, or other written material from your doctor or pharmacy?: Never In the past 12 months, has lack of transportation kept you from medical appointments or from getting medications?: No In the past 12 months, has lack of transportation kept you from meetings, work, or from getting things needed for daily living?: No   Home Assistive Devices / Equipment Home Assistive Devices/Equipment: Cane (specify quad or straight) Home Equipment: Rollator (4 wheels), Cane - quad   Prior Device Use: Indicate devices/aids used by the patient prior to current illness, exacerbation or injury?  cane   Current Functional Level Cognition   Arousal/Alertness: Awake/alert Overall Cognitive Status: No family/caregiver present to determine baseline cognitive functioning Orientation Level: Oriented X4 Following Commands: Follows multi-step commands with increased time Safety/Judgement: Decreased awareness of deficits, Decreased awareness of safety General Comments: HOH Attention: Sustained Sustained Attention: Appears intact Memory:  (TBA) Awareness:  (intact for dysarthria) Problem  Solving: Appears intact (basic) Safety/Judgment: Appears intact (verbal info)    Extremity Assessment (includes Sensation/Coordination)   Upper Extremity Assessment: Generalized weakness  Lower Extremity Assessment: Defer to PT evaluation RLE Deficits / Details: grossly 3/5, increased time to achieve test position, losing balance in sitting with movement of LE RLE Sensation:  (pt unable to clearly answer) RLE Coordination: decreased fine motor, decreased gross motor LLE Deficits / Details: grossly 4-/5, losing balance with LE movements. LLE Coordination: decreased fine motor, decreased gross motor     ADLs   Overall ADL's : Needs assistance/impaired Eating/Feeding: Set up, Sitting Grooming: Wash/dry hands, Wash/dry face, Set up, Sitting Grooming Details (indicate cue type and reason): at sink Upper Body Bathing: Minimal assistance, Sitting Lower Body Bathing: Maximal assistance, Sitting/lateral leans Upper Body Dressing : Minimal assistance, Sitting Upper Body Dressing Details (indicate cue type and reason): to donn gown Lower Body Dressing: Moderate assistance, Sit to/from stand Toilet Transfer: Min guard, Rolling walker (2 wheels), Regular Toilet Toilet Transfer Details (indicate cue type and reason): transfer training to BSC Toileting- Clothing Manipulation and Hygiene: Maximal assistance, Total assistance Toileting - Clothing Manipulation Details (indicate cue type and reason): total A in standing to complete peri-care after patient found sitting in soiled linen Functional mobility during ADLs: Minimal assistance, Moderate assistance, Cueing for safety, Cueing for sequencing General ADL Comments: difficulty with sit to stands     Mobility   Overal bed mobility: Needs Assistance Bed Mobility: Supine to Sit Supine to sit: Min assist, HOB elevated Sit to supine: Mod assist General bed mobility comments: Seated in recliner cues for hand placement to scoot forward and backward into  recliner.     Transfers   Overall transfer level: Needs assistance Equipment used: Rolling walker (2 wheels) Transfers: Sit to/from Stand, Bed to chair/wheelchair/BSC Sit to Stand: Min guard, Min assist Bed to/from chair/wheelchair/BSC transfer type:: Step pivot Stand pivot transfers: Min guard Step pivot transfers: Mod assist General transfer comment: MOd cues for hand placement and forward weight shifting to and from seated surface.     Ambulation / Gait / Stairs / Wheelchair Mobility   Ambulation/Gait Ambulation/Gait assistance: Min guard Gait Distance (Feet): 100 Feet Assistive device: Rolling walker (2 wheels) Gait   Pattern/deviations: Decreased step length - right, Decreased step length - left, Shuffle, Leaning posteriorly, Narrow base of support General Gait Details: Cues for posture awareness and safe body position in RW when turning and backing.  Cues to increase B stride length as fatigue sets in. Gait velocity: slowed Gait velocity interpretation: <1.31 ft/sec, indicative of household ambulator Pre-gait activities: standing marches, minimal clearance, strong posterior lean     Posture / Balance Dynamic Sitting Balance Sitting balance - Comments: able to sit on EOB without assistance Balance Overall balance assessment: Needs assistance Sitting-balance support: No upper extremity supported, Feet supported Sitting balance-Leahy Scale: Good Sitting balance - Comments: able to sit on EOB without assistance Postural control: Posterior lean Standing balance support: Reliant on assistive device for balance Standing balance-Leahy Scale: Poor Standing balance comment: reliance on BUE support     Special needs/care consideration Skin DTPI unstageable to coccyx and Diabetic management yes    Previous Home Environment (from acute therapy documentation) Living Arrangements: Children Available Help at Discharge: Family, Available 24 hours/day Type of Home: House Home Layout: Two  level Alternate Level Stairs-Rails: Right, Left Alternate Level Stairs-Number of Steps: 13 Home Access: Stairs to enter Entrance Stairs-Number of Steps: 3 Bathroom Shower/Tub: Walk-in shower Bathroom Toilet: Standard Bathroom Accessibility: Yes How Accessible: Accessible via walker Home Care Services: Yes Type of Home Care Services: Homehealth aide Home Care Agency (if known):  ([facility])   Discharge Living Setting Plans for Discharge Living Setting: Lives with (comment) (son (Frank) and daughter in law) Type of Home at Discharge: House Discharge Home Layout: Two level, Bed/bath upstairs Alternate Level Stairs-Rails: Right, Left Alternate Level Stairs-Number of Steps: 13 Discharge Home Access: Stairs to enter Entrance Stairs-Rails: None Entrance Stairs-Number of Steps: 3 Discharge Bathroom Shower/Tub: Walk-in shower Discharge Bathroom Toilet: Standard Discharge Bathroom Accessibility: Yes How Accessible: Accessible via walker Does the patient have any problems obtaining your medications?: No   Social/Family/Support Systems Anticipated Caregiver: son Frank Anticipated Caregiver's Contact Information: [phone] Ability/Limitations of Caregiver: n/a, both Frank and his wife work from home Caregiver Availability: 24/7 Discharge Plan Discussed with Primary Caregiver: Yes Is Caregiver In Agreement with Plan?: Yes Does Caregiver/Family have Issues with Lodging/Transportation while Pt is in Rehab?: No   Goals Patient/Family Goal for Rehab: PT/OT/SLP supervision Expected length of stay: 9-12 days Pt/Family Agrees to Admission and willing to participate: Yes Program Orientation Provided & Reviewed with Pt/Caregiver Including Roles  & Responsibilities: Yes  Barriers to Discharge: Insurance for SNF coverage   Decrease burden of Care through IP rehab admission: na   Possible need for SNF placement upon discharge: Not anticipated.    Patient Condition: I have reviewed  medical records from [facility], spoken with  TOC team , and patient and son. I met with patient at the bedside and discussed via phone for inpatient rehabilitation assessment.  Patient will benefit from ongoing PT, OT, and SLP, can actively participate in 3 hours of therapy a day 5 days of the week, and can make measurable gains during the admission.  Patient will also benefit from the coordinated team approach during an Inpatient Acute Rehabilitation admission.  The patient will receive intensive therapy as well as Rehabilitation physician, nursing, social worker, and care management interventions.  Due to bladder management, safety, skin/wound care, disease management, medication administration, and patient education the patient requires 24 hour a day rehabilitation nursing.  The patient is currently min assist to min guard with mobility and basic ADLs.  Discharge setting and therapy post   discharge at home with home health is anticipated.  Patient has agreed to participate in the Acute Inpatient Rehabilitation Program and will admit today.   Preadmission Screen Completed By:  Caitlin E Warren, PT, DPT 03/09/2022 1:00 PM ______________________________________________________________________   Discussed status with Dr. Manasseh Pittsley on 03/09/22  at 1:00 PM  and received approval for admission today.   Admission Coordinator:  Caitlin E Warren, PT, DPT time 1:00 PM /Date 03/09/22     Assessment/Plan: Diagnosis:acute punctate infarct within the right cerebellar hemisphere, possible right frontal lobe infarct likely cardioembolic in the setting of paroxysmal atrial fibrillation Does the need for close, 24 hr/day Medical supervision in concert with the patient's rehab needs make it unreasonable for this patient to be served in a less intensive setting? Yes Co-Morbidities requiring supervision/potential complications: paroxysmal A-fib not on anticoagulation, hypertension, hypothyroidism, Parkinson's disease,  lumbar spondylosis, type 2 diabetes, hyperlipidemia, PVD, CKD stage IIIb, remote history of colon cancer, BPH, pancreatic insufficiency, gallstones, GERD Due to bladder management, bowel management, safety, skin/wound care, disease management, medication administration, pain management, and patient education, does the patient require 24 hr/day rehab nursing? Yes Does the patient require coordinated care of a physician, rehab nurse, PT, OT, and SLP to address physical and functional deficits in the context of the above medical diagnosis(es)? Yes Addressing deficits in the following areas: balance, endurance, locomotion, strength, transferring, bowel/bladder control, bathing, dressing, feeding, grooming, toileting, cognition, speech, language, swallowing, and psychosocial support Can the patient actively participate in an intensive therapy program of at least 3 hrs of therapy 5 days a week? Yes The potential for patient to make measurable gains while on inpatient rehab is good Anticipated functional outcomes upon discharge from inpatient rehab: supervision PT, supervision OT, supervision SLP Estimated rehab length of stay to reach the above functional goals is: 9-12 days Anticipated discharge destination: Home 10. Overall Rehab/Functional Prognosis: good     MD Signature: Sartaj Hoskin 

## 2022-03-09 ENCOUNTER — Other Ambulatory Visit: Payer: Self-pay

## 2022-03-09 ENCOUNTER — Telehealth: Payer: Self-pay | Admitting: Acute Care

## 2022-03-09 ENCOUNTER — Inpatient Hospital Stay (HOSPITAL_COMMUNITY)
Admission: RE | Admit: 2022-03-09 | Discharge: 2022-03-22 | DRG: 056 | Disposition: A | Discharge status: Home Health | Payer: PPO | Source: Intra-hospital | Attending: Physical Medicine and Rehabilitation | Admitting: Physical Medicine and Rehabilitation

## 2022-03-09 ENCOUNTER — Encounter (HOSPITAL_COMMUNITY): Payer: Self-pay | Admitting: Physical Medicine and Rehabilitation

## 2022-03-09 DIAGNOSIS — K56609 Unspecified intestinal obstruction, unspecified as to partial versus complete obstruction: Secondary | ICD-10-CM | POA: Diagnosis not present

## 2022-03-09 DIAGNOSIS — E876 Hypokalemia: Secondary | ICD-10-CM | POA: Diagnosis not present

## 2022-03-09 DIAGNOSIS — E039 Hypothyroidism, unspecified: Secondary | ICD-10-CM | POA: Diagnosis not present

## 2022-03-09 DIAGNOSIS — Z86010 Personal history of colonic polyps: Secondary | ICD-10-CM

## 2022-03-09 DIAGNOSIS — E1151 Type 2 diabetes mellitus with diabetic peripheral angiopathy without gangrene: Secondary | ICD-10-CM | POA: Diagnosis present

## 2022-03-09 DIAGNOSIS — R0982 Postnasal drip: Secondary | ICD-10-CM | POA: Diagnosis not present

## 2022-03-09 DIAGNOSIS — K8689 Other specified diseases of pancreas: Secondary | ICD-10-CM | POA: Diagnosis not present

## 2022-03-09 DIAGNOSIS — E1122 Type 2 diabetes mellitus with diabetic chronic kidney disease: Secondary | ICD-10-CM | POA: Diagnosis present

## 2022-03-09 DIAGNOSIS — I480 Paroxysmal atrial fibrillation: Secondary | ICD-10-CM | POA: Diagnosis present

## 2022-03-09 DIAGNOSIS — I63449 Cerebral infarction due to embolism of unspecified cerebellar artery: Secondary | ICD-10-CM

## 2022-03-09 DIAGNOSIS — Z66 Do not resuscitate: Secondary | ICD-10-CM | POA: Diagnosis present

## 2022-03-09 DIAGNOSIS — Z800 Family history of malignant neoplasm of digestive organs: Secondary | ICD-10-CM | POA: Diagnosis not present

## 2022-03-09 DIAGNOSIS — Z9049 Acquired absence of other specified parts of digestive tract: Secondary | ICD-10-CM | POA: Diagnosis not present

## 2022-03-09 DIAGNOSIS — D649 Anemia, unspecified: Secondary | ICD-10-CM | POA: Diagnosis not present

## 2022-03-09 DIAGNOSIS — K219 Gastro-esophageal reflux disease without esophagitis: Secondary | ICD-10-CM | POA: Diagnosis not present

## 2022-03-09 DIAGNOSIS — Z880 Allergy status to penicillin: Secondary | ICD-10-CM

## 2022-03-09 DIAGNOSIS — I493 Ventricular premature depolarization: Secondary | ICD-10-CM | POA: Diagnosis not present

## 2022-03-09 DIAGNOSIS — L89150 Pressure ulcer of sacral region, unstageable: Secondary | ICD-10-CM | POA: Diagnosis not present

## 2022-03-09 DIAGNOSIS — D631 Anemia in chronic kidney disease: Secondary | ICD-10-CM | POA: Diagnosis present

## 2022-03-09 DIAGNOSIS — Z79890 Hormone replacement therapy (postmenopausal): Secondary | ICD-10-CM

## 2022-03-09 DIAGNOSIS — L89892 Pressure ulcer of other site, stage 2: Secondary | ICD-10-CM | POA: Diagnosis present

## 2022-03-09 DIAGNOSIS — I639 Cerebral infarction, unspecified: Secondary | ICD-10-CM | POA: Diagnosis present

## 2022-03-09 DIAGNOSIS — N1832 Chronic kidney disease, stage 3b: Secondary | ICD-10-CM | POA: Diagnosis present

## 2022-03-09 DIAGNOSIS — Z7984 Long term (current) use of oral hypoglycemic drugs: Secondary | ICD-10-CM

## 2022-03-09 DIAGNOSIS — R6889 Other general symptoms and signs: Secondary | ICD-10-CM | POA: Diagnosis not present

## 2022-03-09 DIAGNOSIS — R339 Retention of urine, unspecified: Secondary | ICD-10-CM | POA: Diagnosis not present

## 2022-03-09 DIAGNOSIS — I63441 Cerebral infarction due to embolism of right cerebellar artery: Secondary | ICD-10-CM

## 2022-03-09 DIAGNOSIS — N179 Acute kidney failure, unspecified: Secondary | ICD-10-CM | POA: Diagnosis not present

## 2022-03-09 DIAGNOSIS — Z6826 Body mass index (BMI) 26.0-26.9, adult: Secondary | ICD-10-CM

## 2022-03-09 DIAGNOSIS — W19XXXD Unspecified fall, subsequent encounter: Secondary | ICD-10-CM | POA: Diagnosis present

## 2022-03-09 DIAGNOSIS — I69398 Other sequelae of cerebral infarction: Principal | ICD-10-CM

## 2022-03-09 DIAGNOSIS — H04123 Dry eye syndrome of bilateral lacrimal glands: Secondary | ICD-10-CM | POA: Diagnosis not present

## 2022-03-09 DIAGNOSIS — R918 Other nonspecific abnormal finding of lung field: Secondary | ICD-10-CM | POA: Diagnosis present

## 2022-03-09 DIAGNOSIS — J3489 Other specified disorders of nose and nasal sinuses: Secondary | ICD-10-CM | POA: Diagnosis not present

## 2022-03-09 DIAGNOSIS — I129 Hypertensive chronic kidney disease with stage 1 through stage 4 chronic kidney disease, or unspecified chronic kidney disease: Secondary | ICD-10-CM | POA: Diagnosis present

## 2022-03-09 DIAGNOSIS — Z823 Family history of stroke: Secondary | ICD-10-CM

## 2022-03-09 DIAGNOSIS — I63419 Cerebral infarction due to embolism of unspecified middle cerebral artery: Secondary | ICD-10-CM | POA: Diagnosis not present

## 2022-03-09 DIAGNOSIS — E785 Hyperlipidemia, unspecified: Secondary | ICD-10-CM | POA: Diagnosis not present

## 2022-03-09 DIAGNOSIS — R600 Localized edema: Secondary | ICD-10-CM | POA: Diagnosis present

## 2022-03-09 DIAGNOSIS — N400 Enlarged prostate without lower urinary tract symptoms: Secondary | ICD-10-CM | POA: Diagnosis present

## 2022-03-09 DIAGNOSIS — G20 Parkinson's disease: Secondary | ICD-10-CM | POA: Diagnosis not present

## 2022-03-09 DIAGNOSIS — M545 Low back pain, unspecified: Secondary | ICD-10-CM | POA: Diagnosis present

## 2022-03-09 DIAGNOSIS — R944 Abnormal results of kidney function studies: Secondary | ICD-10-CM | POA: Diagnosis present

## 2022-03-09 DIAGNOSIS — I10 Essential (primary) hypertension: Secondary | ICD-10-CM | POA: Diagnosis not present

## 2022-03-09 DIAGNOSIS — Z833 Family history of diabetes mellitus: Secondary | ICD-10-CM

## 2022-03-09 DIAGNOSIS — R54 Age-related physical debility: Secondary | ICD-10-CM | POA: Diagnosis not present

## 2022-03-09 DIAGNOSIS — Z85038 Personal history of other malignant neoplasm of large intestine: Secondary | ICD-10-CM | POA: Diagnosis not present

## 2022-03-09 DIAGNOSIS — Z7982 Long term (current) use of aspirin: Secondary | ICD-10-CM

## 2022-03-09 DIAGNOSIS — E1169 Type 2 diabetes mellitus with other specified complication: Secondary | ICD-10-CM

## 2022-03-09 DIAGNOSIS — F32A Depression, unspecified: Secondary | ICD-10-CM | POA: Diagnosis present

## 2022-03-09 DIAGNOSIS — Z79899 Other long term (current) drug therapy: Secondary | ICD-10-CM

## 2022-03-09 DIAGNOSIS — Z7901 Long term (current) use of anticoagulants: Secondary | ICD-10-CM

## 2022-03-09 DIAGNOSIS — E43 Unspecified severe protein-calorie malnutrition: Secondary | ICD-10-CM | POA: Diagnosis present

## 2022-03-09 DIAGNOSIS — Z741 Need for assistance with personal care: Secondary | ICD-10-CM | POA: Diagnosis present

## 2022-03-09 DIAGNOSIS — Z87891 Personal history of nicotine dependence: Secondary | ICD-10-CM

## 2022-03-09 LAB — GLUCOSE, CAPILLARY
Glucose-Capillary: 147 mg/dL — ABNORMAL HIGH (ref 70–99)
Glucose-Capillary: 169 mg/dL — ABNORMAL HIGH (ref 70–99)
Glucose-Capillary: 97 mg/dL (ref 70–99)

## 2022-03-09 MED ORDER — APIXABAN 2.5 MG PO TABS: 2.5000 mg | ORAL_TABLET | Freq: Two times a day (BID) | ORAL | Status: DC

## 2022-03-09 MED ORDER — METOPROLOL TARTRATE 25 MG PO TABS: 12.5000 mg | ORAL_TABLET | Freq: Two times a day (BID) | ORAL | Status: DC

## 2022-03-09 MED ORDER — ENSURE ENLIVE PO LIQD: 237.0000 mL | Freq: Two times a day (BID) | ORAL | Status: DC

## 2022-03-09 MED ORDER — ASPIRIN 81 MG PO CHEW: 81.0000 mg | CHEWABLE_TABLET | Freq: Every day | ORAL | Status: DC

## 2022-03-09 NOTE — Progress Notes (Signed)
Occupational Therapy Treatment Patient Details Name: Steven Ward MRN: [id number] DOB: [birth year] Today's Date: 03/09/2022   History of present illness The pt is a 88 yo male presenting 6/7 with weakness, fatigue, poor appetite, and fall while walking to the bathroom on day of admission. Pt found to be in afib with frequent PVCs (new after ablation 20 years ago). CT revealed mid/distal SBO, NG tube placed 6/7. MRI revealed R cerebellar infarct. PMH includes: DM II, HLD, PVD, PD, PAF, CKD III, colon cancer, and BPH.   OT comments  Patient with fair progress toward patient focused goals.  Patient with improved stand balance, not leaning back this date during in room mobility at RW level.  Able to perform light grooming standing with up to [employer].  Safety cues needed for RW management, but improved activity tolerance noted.  Patient appears to be able to participate with 3 hours of intensive rehab at [facility].  OT will continue efforts in the acute setting.  OT should look to progress ADL completion and introduce hip kit pieces as needed.     Recommendations for follow up therapy are one component of a multi-disciplinary discharge planning process, led by the attending physician.  Recommendations may be updated based on patient status, additional functional criteria and insurance authorization.    Follow Up Recommendations  Acute inpatient rehab (3hours/day)    Assistance Recommended at Discharge Frequent or constant Supervision/Assistance  Patient can return home with the following  A lot of help with bathing/dressing/bathroom;Assistance with cooking/housework;Help with stairs or ramp for entrance;Assist for transportation;Direct supervision/assist for financial management;Direct supervision/assist for medications management;A lot of help with walking and/or transfers   Equipment Recommendations  BSC/3in1    Recommendations for Other Services      Precautions / Restrictions  Precautions Precautions: Fall Restrictions Weight Bearing Restrictions: No       Mobility Bed Mobility Overal bed mobility: Needs Assistance         Sit to supine: Mod assist        Transfers Overall transfer level: Needs assistance Equipment used: Rolling walker (2 wheels) Transfers: Sit to/from Stand, Bed to chair/wheelchair/BSC Sit to Stand: Min guard, Min assist Stand pivot transfers: Min guard               Balance Overall balance assessment: Needs assistance Sitting-balance support: No upper extremity supported, Feet supported Sitting balance-Leahy Scale: Good     Standing balance support: Reliant on assistive device for balance Standing balance-Leahy Scale: Poor                             ADL either performed or assessed with clinical judgement   ADL   Eating/Feeding: Set up;Sitting   Grooming: Wash/dry hands;Wash/dry face;Set up;Sitting           Upper Body Dressing : Minimal assistance;Sitting   Lower Body Dressing: Moderate assistance;Sit to/from stand   Toilet Transfer: Min guard;Rolling walker (2 wheels);Regular Toilet                  Extremity/Trunk Assessment Upper Extremity Assessment Upper Extremity Assessment: Generalized weakness   Lower Extremity Assessment Lower Extremity Assessment: Defer to PT evaluation   Cervical / Trunk Assessment Cervical / Trunk Assessment: Kyphotic                      Cognition Arousal/Alertness: Awake/alert Behavior During Therapy: Flat affect  Following Commands: Follows multi-step commands with increased time     Problem Solving: Slow processing General Comments: HOH                     General Comments  VSS on RA    Pertinent Vitals/ Pain       Pain Assessment Pain Assessment: No/denies pain                                                          Frequency  Min 2X/week         Progress Toward Goals  OT Goals(current goals can now be found in the care plan section)  Progress towards OT goals: Progressing toward goals  Acute Rehab OT Goals OT Goal Formulation: With patient Time For Goal Achievement: 03/23/22 ADL Goals Pt Will Perform Grooming: with set-up;standing Pt Will Perform Lower Body Dressing: with supervision;sit to/from stand Pt Will Transfer to Toilet: with supervision;ambulating;regular height toilet  Plan Discharge plan remains appropriate    Co-evaluation                 AM-PAC OT "6 Clicks" Daily Activity     Outcome Measure   Help from another person eating meals?: None Help from another person taking care of personal grooming?: None Help from another person toileting, which includes using toliet, bedpan, or urinal?: A Lot Help from another person bathing (including washing, rinsing, drying)?: A Lot Help from another person to put on and taking off regular upper body clothing?: A Little Help from another person to put on and taking off regular lower body clothing?: A Lot 6 Click Score: 17    End of Session Equipment Utilized During Treatment: Rolling walker (2 wheels)  OT Visit Diagnosis: Unsteadiness on feet (R26.81);Muscle weakness (generalized) (M62.81);Ataxia, unspecified (R27.0)   Activity Tolerance Patient tolerated treatment well   Patient Left in bed;with call bell/phone within reach;with bed alarm set   Nurse Communication Mobility status        Time: 0093-8182 OT Time Calculation (min): 16 min  Charges: OT General Charges $OT Visit: 1 Visit OT Treatments $Self Care/Home Management : 8-22 mins  03/09/2022  RP, OTR/L  Acute Rehabilitation Services  Office:  [phone]   Metta Clines 03/09/2022, 9:58 AM

## 2022-03-09 NOTE — H&P (Incomplete)
Physical Medicine and Rehabilitation Admission H&P    CC: Functional deficits secondary to acute right cerebellar hemisphere punctate ischemic stroke  HPI: Steven Ward is a 88 year old male who was brought to [facility] emergency department on 02/22/2022 by his son who noted increased generalized weakness worsening over the past 24 hours. The patient fell at home and was found down, not able to get up. History of atrial fibrillation and Parkinson's disease. EKG showed a. fib and cardiology, Dr. Tamala Julian, was consulted. Tele: rate-controlled atrial fibrillation with frequent PVCs. UA and chest x-ray were unremarkable.  His history is remarkable for ablation approximately 20 years previously.  Echocardiogram and stroke work-up recommended by cardiology.  Also, recommended starting apixaban. Patient's son preferred not to start this due to risk of bleeding and father's advanced age. Serum creatinine 2.4 and given IVF hydration.   Additionally, he had complaints of nausea, vomiting and abdominal distention.  CT of abdomen and pelvis obtained. Surgical consultation obtained for possible SBO. NGT placed. NGT clamped 6/10 and eventually removed and diet advanced.  CT brain negative; MRI shows subacute infarct and right frontal lobe, punctate right cerebellar infarcts.  Continue metoprolol tartrate 12.5 mg twice daily.  Keep potassium level greater than 4 and magnesium level greater than 2.  2D echo normal.  Cardiology signed off 6/8.  Neurology, Dr. Leonel Ramsay, consulted on 6/9.  Small punctate stroke very likely related to atrial fibrillation and okay to start anticoagulation.  TTE with normal LVEF of 55 to 60% with no evidence of atrial level shunt.  Started on aspirin 81 mg and Eliquis 2.5 mg twice daily added on 6/15.  Treated for possible right upper lobe pneumonia with ceftriaxone and Flagyl. He received supplementation for low potassium and low magnesium.    His past medical history is also  significant for hypertension, hypothyroidism, Parkinson's disease, lumbar spondylosis diabetes mellitus type 2, hyperlipidemia, peripheral vascular disease, chronic kidney disease stage IIIb, remote history of colon cancer with colon resection, benign prostatic [name], pancreatic insufficiency, gastroesophageal reflux disease.  He is seen by Dr. Jarold Song at [facility] primary care who recently treated him for episode of shingles on the face and follow-up diabetes.  He was taken off his glyburide secondary to chronic kidney disease and recently started on glipizide initially 2.5 mg increased to 5 mg daily 1 month later.  His recent A1c was 7.7  The patient lives at home with his son, [name]. Says he moves around house without assistance and uses cane outside of the home.  Review of Systems  Constitutional:  Negative for chills and fever.  Respiratory:  Negative for cough.   Cardiovascular:  Negative for chest pain.  Gastrointestinal:  Negative for vomiting.       Decreased appetite   Genitourinary:        Has external urinary collection bag in place  Musculoskeletal:  Negative for back pain and myalgias.  Neurological:  Positive for tremors and weakness. Negative for headaches.   Past Medical History:  Diagnosis Date   ABNORMAL THYROID FUNCTION TESTS 01/17/2010   ALLERGIC RHINITIS 12/01/2008   Colon cancer ([place])    COLONIC POLYPS, HX OF 12/01/2008   DIABETES MELLITUS, TYPE II 12/01/2008   EDEMA LEG 05/04/2009   ESSENTIAL HYPERTENSION 12/01/2008   Exocrine pancreatic insufficiency    GALLSTONES 04/07/2009   GERD 12/01/2008   Hay fever    SPONDYLOSIS, LUMBAR 12/01/2008   Past Surgical History:  Procedure Laterality Date   ANKLE SURGERY Right  CATARACT EXTRACTION     CHOLECYSTECTOMY     COLON SURGERY     resection 1990 for cancer   LEG SURGERY Left    Family History  Problem Relation Age of Onset   Cancer Other        colon   Diabetes Other    Stroke Other    Cancer Mother     Social History:  reports that he quit smoking about 53 years ago. His smoking use included cigarettes. He has a 20.00 pack-year smoking history. He has never used smokeless tobacco. He reports that he does not drink alcohol and does not use drugs. Allergies:  Allergies  Allergen Reactions   Amoxicillin     REACTION: rash, dizziness   Medications Prior to Admission  Medication Sig Dispense Refill   Carbidopa-Levodopa ER (SINEMET CR) 25-100 MG tablet controlled release TAKE 2 TABLETS BY MOUTH IN THE MORNING AND 2 IN THE AFTERNOON AND 1 IN THE EVENING (Patient taking differently: 1-2 tablets See admin instructions. TAKE 2 TABLETS BY MOUTH IN THE MORNING AND 2 IN THE AFTERNOON AND 1 IN THE EVENING) 450 tablet 1   furosemide (LASIX) 20 MG tablet Take one tablet by mouth once daily as needed for edema/swelling. (Patient taking differently: Take 20 mg by mouth daily.) 30 tablet 1   glipiZIDE (GLUCOTROL) 5 MG tablet Take one half tablet by mouth once daily. (Patient taking differently: Take 5 mg by mouth daily before breakfast.) 60 tablet 3   hydrochlorothiazide (MICROZIDE) 12.5 MG capsule TAKE 1 CAPSULE BY MOUTH EVERY DAY (Patient taking differently: Take 12.5 mg by mouth daily.) 90 capsule 0   levothyroxine (SYNTHROID) 25 MCG tablet Take 1 tablet (25 mcg total) by mouth daily. 90 tablet 1   lipase/protease/amylase (CREON) 12000-38000 units CPEP capsule Take 12,000 Units by mouth 3 (three) times daily before meals.     ondansetron (ZOFRAN-ODT) 4 MG disintegrating tablet Take 1 tablet (4 mg total) by mouth every 8 (eight) hours as needed for nausea or vomiting. 20 tablet 0   polyethylene glycol (MIRALAX / GLYCOLAX) packet Take 17 g by mouth daily as needed for moderate constipation.     tamsulosin (FLOMAX) 0.4 MG CAPS capsule TAKE 1 CAPSULE BY MOUTH EVERY DAY (Patient taking differently: 0.4 mg daily.) 90 capsule 0   triamcinolone cream (KENALOG) 0.1 % APPLY  CREAM EXTERNALLY TO AFFECTED AREA TWICE  DAILY AS NEEDED (Patient taking differently: 1 application. daily as needed (itching).) 80 g 2   blood glucose meter kit and supplies KIT Dispense based on patient and insurance preference. Use up to four times daily as directed. (FOR ICD-9 250.00, 250.01). 1 each 0   Blood Glucose Monitoring Suppl (ONE TOUCH ULTRA 2) w/Device KIT Use as directed. 1 kit 0   glucose blood (ONETOUCH ULTRA) test strip USE TO TEST BLOOD SUGAR 3 TIMES A DAY 300 strip 1   Lancet Device MISC Use 1-4 times daily as needed or directed.  DX E11.9 1 each 3   Lancets (ONETOUCH ULTRASOFT) lancets Check 3 times daily. E11.9 300 each 3      Home: Home Living Family/patient expects to be discharged to:: Private residence Living Arrangements: Children Available Help at Discharge: Family, Available 24 hours/day Type of Home: House Home Access: Stairs to enter [employer] of Steps: 3 Home Layout: Two level Alternate Level Stairs-Number of Steps: 13 Alternate Level Stairs-Rails: Right, Left Bathroom Shower/Tub: Multimedia programmer: Standard Bathroom Accessibility: Yes Home Equipment: Rollator (4 wheels), Cane -  quad   Functional History: Prior Function Prior Level of Function : Needs assist, History of Falls (last six months) Physical Assist : Mobility (physical), ADLs (physical) Mobility (physical): Gait, Stairs ADLs (physical): Bathing, IADLs Mobility Comments: Per PT eval: pt states he ambulates with cane, has had 3-4 falls in last 6 months. states his son assists with stairs and is close for walking. no family present to confirm ADLs Comments: pt states assist with IADLs and bathing. Son assist his medications and community mobility.  Functional Status:  Mobility: Bed Mobility Overal bed mobility: Needs Assistance Bed Mobility: Supine to Sit Supine to sit: Min assist, HOB elevated Sit to supine: Mod assist General bed mobility comments: Seated in recliner cues for hand placement to  scoot forward and backward into recliner. Transfers Overall transfer level: Needs assistance Equipment used: Rolling walker (2 wheels) Transfers: Sit to/from Stand, Bed to chair/wheelchair/BSC Sit to Stand: Min guard, Min assist Bed to/from chair/wheelchair/BSC transfer type:: Step pivot Stand pivot transfers: Min guard Step pivot transfers: Mod assist General transfer comment: MOd cues for hand placement and forward weight shifting to and from seated surface. Ambulation/Gait Ambulation/Gait assistance: Min guard Gait Distance (Feet): 100 Feet Assistive device: Rolling walker (2 wheels) Gait Pattern/deviations: Decreased step length - right, Decreased step length - left, Shuffle, Leaning posteriorly, Narrow base of support General Gait Details: Cues for posture awareness and safe body position in RW when turning and backing.  Cues to increase B stride length as fatigue sets in. Gait velocity: slowed Gait velocity interpretation: <1.31 ft/sec, indicative of household ambulator Pre-gait activities: standing marches, minimal clearance, strong posterior lean    ADL: ADL Overall ADL's : Needs assistance/impaired Eating/Feeding: Set up, Sitting Grooming: Wash/dry hands, Wash/dry face, Set up, Sitting Grooming Details (indicate cue type and reason): at sink Upper Body Bathing: Minimal assistance, Sitting Lower Body Bathing: Maximal assistance, Sitting/lateral leans Upper Body Dressing : Minimal assistance, Sitting Upper Body Dressing Details (indicate cue type and reason): to donn gown Lower Body Dressing: Moderate assistance, Sit to/from stand Toilet Transfer: Min guard, Rolling walker (2 wheels), Regular Glass blower/designer Details (indicate cue type and reason): transfer training to [place] and Hygiene: Maximal assistance, Total assistance Toileting - Clothing Manipulation Details (indicate cue type and reason): total A in standing to complete peri-care  after patient found sitting in soiled linen Functional mobility during ADLs: Minimal assistance, Moderate assistance, Cueing for safety, Cueing for sequencing General ADL Comments: difficulty with sit to stands  Cognition: Cognition Overall Cognitive Status: No family/caregiver present to determine baseline cognitive functioning Arousal/Alertness: Awake/alert Orientation Level: Oriented X4 Year: 2023 Month: June Day of Week: Correct Attention: Sustained Sustained Attention: Appears intact Memory:  (TBA) Awareness:  (intact for dysarthria) Problem Solving: Appears intact (basic) Safety/Judgment: Appears intact (verbal info) Cognition Arousal/Alertness: Awake/alert Behavior During Therapy: Flat affect Overall Cognitive Status: No family/caregiver present to determine baseline cognitive functioning Area of Impairment: Following commands Following Commands: Follows multi-step commands with increased time Safety/Judgement: Decreased awareness of deficits, Decreased awareness of safety Awareness: Emergent Problem Solving: Slow processing General Comments: HOH  Physical Exam: Blood pressure (!) 134/48, pulse 86, temperature 98 F (36.7 C), temperature source Oral, resp. rate 18, height $RemoveBe'5\' 8"'[id number]$  (1.727 m), weight 81.4 kg, SpO2 96 %.    General: Alert and oriented x 4, No apparent distress, Frail appearing, NAD HEENT: Head is normocephalic, atraumatic, PERRLA, EOMI, sclera anicteric, oral mucosa pink and moist Neck: Supple without JVD or lymphadenopathy Heart: IIR. No murmurs rubs  or gallops Chest: CTA bilaterally without wheezes, rales, or rhonchi; no distress Abdomen: Soft, non-tender, mildly distended, bowel sounds positive. Extremities: No clubbing, cyanosis. B/L LE 1+ edema. Pulses are 2+ Psych: Pt's affect is appropriate. Pt is cooperative Skin: Clean and intact without signs of breakdown Neuro: Alert and Oriented x 4, follows commands, sensation intact to LT in all 4  extremities. Able to name and repeat. Speech appears fluent.  No pronator drift. Cranial nerves II through XII intact other than tongue deviation to Rt. Bradykinesia Resting tremor more on RUE than LUE FTN slow intact b/l Strength 4/5 in b/l UE 3/5 proximal LE, 4/5 distal LE Musculoskeletal:  Increased tone/Rigidity at in b/l UE and LE practically at elbows and knees with decreased extension at b/l Knees No joint redness noted No joint tenderness elicited  Results for orders placed or performed during the hospital encounter of 02/22/22 (from the past 48 hour(s))  Glucose, capillary     Status: Abnormal   Collection Time: 03/07/22 11:49 AM  Result Value Ref Range   Glucose-Capillary 123 (H) 70 - 99 mg/dL    Comment: Glucose reference range applies only to samples taken after fasting for at least 8 hours.  Glucose, capillary     Status: Abnormal   Collection Time: 03/07/22  3:46 PM  Result Value Ref Range   Glucose-Capillary 124 (H) 70 - 99 mg/dL    Comment: Glucose reference range applies only to samples taken after fasting for at least 8 hours.  Glucose, capillary     Status: None   Collection Time: 03/07/22  9:33 PM  Result Value Ref Range   Glucose-Capillary 96 70 - 99 mg/dL    Comment: Glucose reference range applies only to samples taken after fasting for at least 8 hours.  Basic metabolic panel     Status: Abnormal   Collection Time: 03/08/22  5:32 AM  Result Value Ref Range   Sodium 139 135 - 145 mmol/L   Potassium 4.1 3.5 - 5.1 mmol/L   Chloride 112 (H) 98 - 111 mmol/L   CO2 22 22 - 32 mmol/L   Glucose, Bld 136 (H) 70 - 99 mg/dL    Comment: Glucose reference range applies only to samples taken after fasting for at least 8 hours.   BUN 35 (H) 8 - 23 mg/dL   Creatinine, Ser 1.62 (H) 0.61 - 1.24 mg/dL   Calcium 7.7 (L) 8.9 - 10.3 mg/dL   GFR, Estimated 39 (L) >60 mL/min    Comment: (NOTE) Calculated using the CKD-EPI Creatinine Equation (2021)    Anion gap 5 5 - 15     Comment: Performed at [facility] [address], [place], [place] [address]  Glucose, capillary     Status: Abnormal   Collection Time: 03/08/22  7:03 AM  Result Value Ref Range   Glucose-Capillary 135 (H) 70 - 99 mg/dL    Comment: Glucose reference range applies only to samples taken after fasting for at least 8 hours.  Glucose, capillary     Status: None   Collection Time: 03/08/22 11:58 AM  Result Value Ref Range   Glucose-Capillary 90 70 - 99 mg/dL    Comment: Glucose reference range applies only to samples taken after fasting for at least 8 hours.  Glucose, capillary     Status: None   Collection Time: 03/08/22  3:52 PM  Result Value Ref Range   Glucose-Capillary 88 70 - 99 mg/dL    Comment: Glucose reference  range applies only to samples taken after fasting for at least 8 hours.  Glucose, capillary     Status: Abnormal   Collection Time: 03/08/22  9:15 PM  Result Value Ref Range   Glucose-Capillary 159 (H) 70 - 99 mg/dL    Comment: Glucose reference range applies only to samples taken after fasting for at least 8 hours.  Glucose, capillary     Status: Abnormal   Collection Time: 03/09/22  6:19 AM  Result Value Ref Range   Glucose-Capillary 147 (H) 70 - 99 mg/dL    Comment: Glucose reference range applies only to samples taken after fasting for at least 8 hours.   No results found.    Blood pressure (!) 134/48, pulse 86, temperature 98 F (36.7 C), temperature source Oral, resp. rate 18, height $RemoveBe'5\' 8"'[id number]$  (1.727 m), weight 81.4 kg, SpO2 96 %.  Medical Problem List and Plan: 1. Functional deficits secondary to acute punctate infarct within the right cerebellar hemisphere, possible right frontal lobe infarct likely cardioembolic in the setting of paroxysmal atrial fibrillation  -patient may shower  -ELOS/Goals: 9-12 days  -Admit to CIR 2.  Antithrombotics: -DVT/anticoagulation:  Pharmaceutical: Other (comment)Eliquis 2.5 mg daily  -antiplatelet therapy: aspirin 81 mg  daily 3. Pain Management: Tylenol as needed 4. Mood/Sleep: LCSW to evaluate and provide emotional support  -antipsychotic agents: n/a 5. Neuropsych/cognition: This patient is capable of making decisions on his own behalf. 6. Skin/Wound Care: Routine skin care checks  --monitor gluteal fold Stage 2 pressure injury, skin injury over coccyx 7. Fluids/Electrolytes/Nutrition: Routine Is and Os and follow-up chemistries 8: Atrial fibrillation, paroxysmal:  -continue Lopressor 12.5 mg BID -continue Eliquis 9: Parkinson's disease: continue sinemet CR 10: DM-2: continue CBG monitoring and SSI  -was recently started on glipizide titrated up to 5 mg daily at home (not restarted) A1c = 7.9 on 6/9 11: Hypertension: continue to monitor  -continue Lopressor 12: CKD stage 3b: elevated BUN and creatinine: follow-up chemistries and ensure adequate PO fluids. Appears Scr currently at baseline 13: Pulmonary plaques/LLL mass: follow-up as outpatient with pulmonology 14: Chronic pancreatic insufficiency, on creon 15: BPH: continue Flomax and monitor for retention  --has external urine collection system; voiding trial 16: Hypothyroidism: continue Synthroid 25 mcg daily 18: SBO: resolved; now on regular diet 19: recent Zoster to face 20: Anemia: improving; follow-up CBC  21. RUL opacities, he was treated with ceftriaxone and flagyl  I have personally performed a face to face diagnostic evaluation of this patient and formulated the key components of the plan.  Additionally, I have personally reviewed laboratory data, imaging studies, as well as relevant notes and concur with the physician assistant's documentation above.  The patient's status has not changed from the original H&P.  Any changes in documentation from the acute care chart have been noted above.  Jennye Boroughs, MD, FAAPMR   Barbie Banner, PA-C 03/09/2022

## 2022-03-09 NOTE — H&P (Signed)
Physical Medicine and Rehabilitation Admission H&P     CC: Functional deficits secondary to acute right cerebellar hemisphere punctate ischemic stroke   HPI: Steven Ward is a 88 year old male who was brought to [facility] emergency department on 02/22/2022 by his son who noted increased generalized weakness worsening over the past 24 hours. The patient fell at home and was found down, not able to get up. History of atrial fibrillation and Parkinson's disease. EKG showed a. fib and cardiology, Dr. Tamala Julian, was consulted. Tele: rate-controlled atrial fibrillation with frequent PVCs. UA and chest x-ray were unremarkable.  His history is remarkable for ablation approximately 20 years previously.  Echocardiogram and stroke work-up recommended by cardiology.  Also, recommended starting apixaban. Patient's son preferred not to start this due to risk of bleeding and father's advanced age. Serum creatinine 2.4 and given IVF hydration.    Additionally, he had complaints of nausea, vomiting and abdominal distention.  CT of abdomen and pelvis obtained. Surgical consultation obtained for possible SBO. NGT placed. NGT clamped 6/10 and eventually removed and diet advanced.   CT brain negative; MRI shows subacute infarct and right frontal lobe, punctate right cerebellar infarcts.  Continue metoprolol tartrate 12.5 mg twice daily.  Keep potassium level greater than 4 and magnesium level greater than 2.  2D echo normal.  Cardiology signed off 6/8.  Neurology, Dr. Leonel Ramsay, consulted on 6/9.  Small punctate stroke very likely related to atrial fibrillation and okay to start anticoagulation.  TTE with normal LVEF of 55 to 60% with no evidence of atrial level shunt.  Started on aspirin 81 mg and Eliquis 2.5 mg twice daily added on 6/15.  Treated for possible right upper lobe pneumonia with ceftriaxone and Flagyl. He received supplementation for low potassium and low magnesium.      His past medical history is  also significant for hypertension, hypothyroidism, Parkinson's disease, lumbar spondylosis diabetes mellitus type 2, hyperlipidemia, peripheral vascular disease, chronic kidney disease stage IIIb, remote history of colon cancer with colon resection, benign prostatic [name], pancreatic insufficiency, gastroesophageal reflux disease.  He is seen by Dr. Jarold Song at [facility] primary care who recently treated him for episode of shingles on the face and follow-up diabetes.  He was taken off his glyburide secondary to chronic kidney disease and recently started on glipizide initially 2.5 mg increased to 5 mg daily 1 month later.  His recent A1c was 7.7   The patient lives at home with his son, [name]. Says he moves around house without assistance and uses cane outside of the home.   Review of Systems  Constitutional:  Negative for chills and fever.  Respiratory:  Negative for cough.   Cardiovascular:  Negative for chest pain.  Gastrointestinal:  Negative for vomiting.       Decreased appetite   Genitourinary:        Has external urinary collection bag in place  Musculoskeletal:  Negative for back pain and myalgias.  Neurological:  Positive for tremors and weakness. Negative for headaches.        Past Medical History:  Diagnosis Date   ABNORMAL THYROID FUNCTION TESTS 01/17/2010   ALLERGIC RHINITIS 12/01/2008   Colon cancer ([place])     COLONIC POLYPS, HX OF 12/01/2008   DIABETES MELLITUS, TYPE II 12/01/2008   EDEMA LEG 05/04/2009   ESSENTIAL HYPERTENSION 12/01/2008   Exocrine pancreatic insufficiency     GALLSTONES 04/07/2009   GERD 12/01/2008   Hay fever     SPONDYLOSIS,  LUMBAR 12/01/2008         Past Surgical History:  Procedure Laterality Date   ANKLE SURGERY Right     CATARACT EXTRACTION       CHOLECYSTECTOMY       COLON SURGERY        resection 1990 for cancer   LEG SURGERY Left           Family History  Problem Relation Age of Onset   Cancer Other          colon   Diabetes Other      Stroke Other     Cancer Mother      Social History:  reports that he quit smoking about 53 years ago. His smoking use included cigarettes. He has a 20.00 pack-year smoking history. He has never used smokeless tobacco. He reports that he does not drink alcohol and does not use drugs. Allergies:       Allergies  Allergen Reactions   Amoxicillin        REACTION: rash, dizziness          Medications Prior to Admission  Medication Sig Dispense Refill   Carbidopa-Levodopa ER (SINEMET CR) 25-100 MG tablet controlled release TAKE 2 TABLETS BY MOUTH IN THE MORNING AND 2 IN THE AFTERNOON AND 1 IN THE EVENING (Patient taking differently: 1-2 tablets See admin instructions. TAKE 2 TABLETS BY MOUTH IN THE MORNING AND 2 IN THE AFTERNOON AND 1 IN THE EVENING) 450 tablet 1   furosemide (LASIX) 20 MG tablet Take one tablet by mouth once daily as needed for edema/swelling. (Patient taking differently: Take 20 mg by mouth daily.) 30 tablet 1   glipiZIDE (GLUCOTROL) 5 MG tablet Take one half tablet by mouth once daily. (Patient taking differently: Take 5 mg by mouth daily before breakfast.) 60 tablet 3   hydrochlorothiazide (MICROZIDE) 12.5 MG capsule TAKE 1 CAPSULE BY MOUTH EVERY DAY (Patient taking differently: Take 12.5 mg by mouth daily.) 90 capsule 0   levothyroxine (SYNTHROID) 25 MCG tablet Take 1 tablet (25 mcg total) by mouth daily. 90 tablet 1   lipase/protease/amylase (CREON) 12000-38000 units CPEP capsule Take 12,000 Units by mouth 3 (three) times daily before meals.       ondansetron (ZOFRAN-ODT) 4 MG disintegrating tablet Take 1 tablet (4 mg total) by mouth every 8 (eight) hours as needed for nausea or vomiting. 20 tablet 0   polyethylene glycol (MIRALAX / GLYCOLAX) packet Take 17 g by mouth daily as needed for moderate constipation.       tamsulosin (FLOMAX) 0.4 MG CAPS capsule TAKE 1 CAPSULE BY MOUTH EVERY DAY (Patient taking differently: 0.4 mg daily.) 90 capsule 0   triamcinolone cream  (KENALOG) 0.1 % APPLY  CREAM EXTERNALLY TO AFFECTED AREA TWICE DAILY AS NEEDED (Patient taking differently: 1 application. daily as needed (itching).) 80 g 2   blood glucose meter kit and supplies KIT Dispense based on patient and insurance preference. Use up to four times daily as directed. (FOR ICD-9 250.00, 250.01). 1 each 0   Blood Glucose Monitoring Suppl (ONE TOUCH ULTRA 2) w/Device KIT Use as directed. 1 kit 0   glucose blood (ONETOUCH ULTRA) test strip USE TO TEST BLOOD SUGAR 3 TIMES A DAY 300 strip 1   Lancet Device MISC Use 1-4 times daily as needed or directed.  DX E11.9 1 each 3   Lancets (ONETOUCH ULTRASOFT) lancets Check 3 times daily. E11.9 300 each 3  Home: Home Living Family/patient expects to be discharged to:: Private residence Living Arrangements: Children Available Help at Discharge: Family, Available 24 hours/day Type of Home: House Home Access: Stairs to enter [employer] of Steps: 3 Home Layout: Two level Alternate Level Stairs-Number of Steps: 13 Alternate Level Stairs-Rails: Right, Left Bathroom Shower/Tub: Multimedia programmer: Standard Bathroom Accessibility: Yes Home Equipment: Rollator (4 wheels), Cane - quad   Functional History: Prior Function Prior Level of Function : Needs assist, History of Falls (last six months) Physical Assist : Mobility (physical), ADLs (physical) Mobility (physical): Gait, Stairs ADLs (physical): Bathing, IADLs Mobility Comments: Per PT eval: pt states he ambulates with cane, has had 3-4 falls in last 6 months. states his son assists with stairs and is close for walking. no family present to confirm ADLs Comments: pt states assist with IADLs and bathing. Son assist his medications and community mobility.   Functional Status:  Mobility: Bed Mobility Overal bed mobility: Needs Assistance Bed Mobility: Supine to Sit Supine to sit: Min assist, HOB elevated Sit to supine: Mod assist General bed  mobility comments: Seated in recliner cues for hand placement to scoot forward and backward into recliner. Transfers Overall transfer level: Needs assistance Equipment used: Rolling walker (2 wheels) Transfers: Sit to/from Stand, Bed to chair/wheelchair/BSC Sit to Stand: Min guard, Min assist Bed to/from chair/wheelchair/BSC transfer type:: Step pivot Stand pivot transfers: Min guard Step pivot transfers: Mod assist General transfer comment: MOd cues for hand placement and forward weight shifting to and from seated surface. Ambulation/Gait Ambulation/Gait assistance: Min guard Gait Distance (Feet): 100 Feet Assistive device: Rolling walker (2 wheels) Gait Pattern/deviations: Decreased step length - right, Decreased step length - left, Shuffle, Leaning posteriorly, Narrow base of support General Gait Details: Cues for posture awareness and safe body position in RW when turning and backing.  Cues to increase B stride length as fatigue sets in. Gait velocity: slowed Gait velocity interpretation: <1.31 ft/sec, indicative of household ambulator Pre-gait activities: standing marches, minimal clearance, strong posterior lean   ADL: ADL Overall ADL's : Needs assistance/impaired Eating/Feeding: Set up, Sitting Grooming: Wash/dry hands, Wash/dry face, Set up, Sitting Grooming Details (indicate cue type and reason): at sink Upper Body Bathing: Minimal assistance, Sitting Lower Body Bathing: Maximal assistance, Sitting/lateral leans Upper Body Dressing : Minimal assistance, Sitting Upper Body Dressing Details (indicate cue type and reason): to donn gown Lower Body Dressing: Moderate assistance, Sit to/from stand Toilet Transfer: Min guard, Rolling walker (2 wheels), Regular Glass blower/designer Details (indicate cue type and reason): transfer training to [place] and Hygiene: Maximal assistance, Total assistance Toileting - Clothing Manipulation Details (indicate  cue type and reason): total A in standing to complete peri-care after patient found sitting in soiled linen Functional mobility during ADLs: Minimal assistance, Moderate assistance, Cueing for safety, Cueing for sequencing General ADL Comments: difficulty with sit to stands   Cognition: Cognition Overall Cognitive Status: No family/caregiver present to determine baseline cognitive functioning Arousal/Alertness: Awake/alert Orientation Level: Oriented X4 Year: 2023 Month: June Day of Week: Correct Attention: Sustained Sustained Attention: Appears intact Memory:  (TBA) Awareness:  (intact for dysarthria) Problem Solving: Appears intact (basic) Safety/Judgment: Appears intact (verbal info) Cognition Arousal/Alertness: Awake/alert Behavior During Therapy: Flat affect Overall Cognitive Status: No family/caregiver present to determine baseline cognitive functioning Area of Impairment: Following commands Following Commands: Follows multi-step commands with increased time Safety/Judgement: Decreased awareness of deficits, Decreased awareness of safety Awareness: Emergent Problem Solving: Slow processing General Comments: HOH  Physical Exam: Blood pressure (!) 134/48, pulse 86, temperature 98 F (36.7 C), temperature source Oral, resp. rate 18, height 5' 8" (1.727 m), weight 81.4 kg, SpO2 96 %.       General: Alert and oriented x 4, No apparent distress, Frail appearing, NAD HEENT: Head is normocephalic, atraumatic, PERRLA, EOMI, sclera anicteric, oral mucosa pink and moist Neck: Supple without JVD or lymphadenopathy Heart: IIR. No murmurs rubs or gallops Chest: CTA bilaterally without wheezes, rales, or rhonchi; no distress Abdomen: Soft, non-tender, mildly distended, bowel sounds positive. Extremities: No clubbing, cyanosis. B/L LE 1+ edema. Pulses are 2+ Psych: Pt's affect is appropriate. Pt is cooperative Skin: Clean and intact without signs of breakdown Neuro: Alert and  Oriented x 4, follows commands, sensation intact to LT in all 4 extremities. Able to name and repeat. Speech appears fluent.  No pronator drift. Cranial nerves II through XII intact other than tongue deviation to Rt. Bradykinesia Resting tremor more on RUE than LUE FTN slow intact b/l Strength 4/5 in b/l UE 3/5 proximal LE, 4/5 distal LE Musculoskeletal:  Increased tone/Rigidity at in b/l UE and LE practically at elbows and knees with decreased extension at b/l Knees No joint redness noted No joint tenderness elicited   Lab Results Last 48 Hours        Results for orders placed or performed during the hospital encounter of 02/22/22 (from the past 48 hour(s))  Glucose, capillary     Status: Abnormal    Collection Time: 03/07/22 11:49 AM  Result Value Ref Range    Glucose-Capillary 123 (H) 70 - 99 mg/dL      Comment: Glucose reference range applies only to samples taken after fasting for at least 8 hours.  Glucose, capillary     Status: Abnormal    Collection Time: 03/07/22  3:46 PM  Result Value Ref Range    Glucose-Capillary 124 (H) 70 - 99 mg/dL      Comment: Glucose reference range applies only to samples taken after fasting for at least 8 hours.  Glucose, capillary     Status: None    Collection Time: 03/07/22  9:33 PM  Result Value Ref Range    Glucose-Capillary 96 70 - 99 mg/dL      Comment: Glucose reference range applies only to samples taken after fasting for at least 8 hours.  Basic metabolic panel     Status: Abnormal    Collection Time: 03/08/22  5:32 AM  Result Value Ref Range    Sodium 139 135 - 145 mmol/L    Potassium 4.1 3.5 - 5.1 mmol/L    Chloride 112 (H) 98 - 111 mmol/L    CO2 22 22 - 32 mmol/L    Glucose, Bld 136 (H) 70 - 99 mg/dL      Comment: Glucose reference range applies only to samples taken after fasting for at least 8 hours.    BUN 35 (H) 8 - 23 mg/dL    Creatinine, Ser 1.62 (H) 0.61 - 1.24 mg/dL    Calcium 7.7 (L) 8.9 - 10.3 mg/dL    GFR, Estimated  39 (L) >60 mL/min      Comment: (NOTE) Calculated using the CKD-EPI Creatinine Equation (2021)      Anion gap 5 5 - 15      Comment: Performed at [facility] [address] [place], [place] [address]  Glucose, capillary     Status: Abnormal    Collection Time: 03/08/22  7:03 AM  Result Value Ref Range    Glucose-Capillary 135 (H) 70 - 99 mg/dL      Comment: Glucose reference range applies only to samples taken after fasting for at least 8 hours.  Glucose, capillary     Status: None    Collection Time: 03/08/22 11:58 AM  Result Value Ref Range    Glucose-Capillary 90 70 - 99 mg/dL      Comment: Glucose reference range applies only to samples taken after fasting for at least 8 hours.  Glucose, capillary     Status: None    Collection Time: 03/08/22  3:52 PM  Result Value Ref Range    Glucose-Capillary 88 70 - 99 mg/dL      Comment: Glucose reference range applies only to samples taken after fasting for at least 8 hours.  Glucose, capillary     Status: Abnormal    Collection Time: 03/08/22  9:15 PM  Result Value Ref Range    Glucose-Capillary 159 (H) 70 - 99 mg/dL      Comment: Glucose reference range applies only to samples taken after fasting for at least 8 hours.  Glucose, capillary     Status: Abnormal    Collection Time: 03/09/22  6:19 AM  Result Value Ref Range    Glucose-Capillary 147 (H) 70 - 99 mg/dL      Comment: Glucose reference range applies only to samples taken after fasting for at least 8 hours.      Imaging Results (Last 48 hours)  No results found.         Blood pressure (!) 134/48, pulse 86, temperature 98 F (36.7 C), temperature source Oral, resp. rate 18, height 5' 8" (1.727 m), weight 81.4 kg, SpO2 96 %.   Medical Problem List and Plan: 1. Functional deficits secondary to acute punctate infarct within the right cerebellar hemisphere, possible right frontal lobe infarct likely cardioembolic in the setting of paroxysmal atrial fibrillation              -patient may shower             -ELOS/Goals: 9-12 days             -Admit to CIR 2.  Antithrombotics: -DVT/anticoagulation:  Pharmaceutical: Other (comment)Eliquis 2.5 mg daily             -antiplatelet therapy: aspirin 81 mg daily 3. Pain Management: Tylenol as needed 4. Mood/Sleep: LCSW to evaluate and provide emotional support             -antipsychotic agents: n/a 5. Neuropsych/cognition: This patient is capable of making decisions on his own behalf. 6. Skin/Wound Care: Routine skin care checks             --monitor gluteal fold Stage 2 pressure injury, skin injury over coccyx 7. Fluids/Electrolytes/Nutrition: Routine Is and Os and follow-up chemistries 8: Atrial fibrillation, paroxysmal:  -continue Lopressor 12.5 mg BID -continue Eliquis 9: Parkinson's disease: continue sinemet CR 10: DM-2: continue CBG monitoring and SSI             -was recently started on glipizide titrated up to 5 mg daily at home (not restarted) A1c = 7.9 on 6/9, CBGs well controlled overall 11: Hypertension: continue to monitor, BP overall well controlled,sometimes a little soft             -continue Lopressor 12: CKD stage 3b: elevated BUN and creatinine: Baseline Cr 1.6 -2 follow-up chemistries and ensure adequate PO fluids. Appears Scr currently at baseline  13: Pulmonary plaques/LLL mass: follow-up as outpatient with pulmonology 14: Chronic pancreatic insufficiency, on creon 15: BPH: continue Flomax and monitor for retention             --has external urine collection system; voiding trial 16: Hypothyroidism: continue Synthroid 25 mcg daily 18: SBO: resolved; now on regular diet  -last BM 6/22 19: recent Zoster to face 20: Anemia: improving; last HGB 9.6.  follow-up CBC  21. RUL opacities, he was treated with ceftriaxone and flagyl   I have personally performed a face to face diagnostic evaluation of this patient and formulated the key components of the plan.  Additionally, I have personally  reviewed laboratory data, imaging studies, as well as relevant notes and concur with the physician assistant's documentation above.   The patient's status has not changed from the original H&P.  Any changes in documentation from the acute care chart have been noted above.   Jennye Boroughs, MD, FAAPMR     Barbie Banner, PA-C 03/09/2022

## 2022-03-09 NOTE — Plan of Care (Signed)
  Problem: Education: Goal: Ability to describe self-care measures that may prevent or decrease complications (Diabetes Survival Skills Education) will improve Outcome: Progressing   Problem: Coping: Goal: Ability to adjust to condition or change in health will improve Outcome: Progressing   Problem: Fluid Volume: Goal: Ability to maintain a balanced intake and output will improve Outcome: Progressing   

## 2022-03-09 NOTE — Progress Notes (Signed)
Physical Therapy Treatment Patient Details Name: Steven Ward MRN: [id number] DOB: August 12, 1925 Today's Date: 03/09/2022   History of Present Illness The pt is a 88 yo male presenting 6/7 with weakness, fatigue, poor appetite, and fall while walking to the bathroom on day of admission. Pt found to be in afib with frequent PVCs (new after ablation 20 years ago). CT revealed mid/distal SBO, NG tube placed 6/7. MRI revealed R cerebellar infarct. PMH includes: DM II, HLD, PVD, PD, PAF, CKD III, colon cancer, and BPH.    PT Comments    Pt tolerates treatment well despite 2 instances of pt reporting lightheadedness. Pt continues to require assistance to mobilize in bed due to generalized weakness. Pt stands well from elevated surfaces with more difficulty ascending from lower levels. Pt will benefit from continued aggressive mobilization in an effort to return to independence in mobility and ADLs. PT continues to recommend AIR.   Recommendations for follow up therapy are one component of a multi-disciplinary discharge planning process, led by the attending physician.  Recommendations may be updated based on patient status, additional functional criteria and insurance authorization.  Follow Up Recommendations  Acute inpatient rehab (3hours/day)     Assistance Recommended at Discharge Frequent or constant Supervision/Assistance  Patient can return home with the following Two people to help with walking and/or transfers;A lot of help with bathing/dressing/bathroom;Assistance with cooking/housework;Direct supervision/assist for medications management;Direct supervision/assist for financial management;Assist for transportation;Help with stairs or ramp for entrance   Equipment Recommendations   (TBD)    Recommendations for Other Services       Precautions / Restrictions Precautions Precautions: Fall Restrictions Weight Bearing Restrictions: No     Mobility  Bed Mobility Overal bed mobility:  Needs Assistance Bed Mobility: Rolling, Sidelying to Sit Rolling: Min guard Sidelying to sit: Mod assist       General bed mobility comments: use of rail, assist to elevate trunk into upright    Transfers Overall transfer level: Needs assistance Equipment used: Rolling walker (2 wheels) Transfers: Sit to/from Stand Sit to Stand: Min guard                Ambulation/Gait Ambulation/Gait assistance: Min guard Gait Distance (Feet): 100 Feet (100' x2 separated by seated rest break) Assistive device: Rolling walker (2 wheels) Gait Pattern/deviations: Step-through pattern Gait velocity: reduced Gait velocity interpretation: <1.8 ft/sec, indicate of risk for recurrent falls   General Gait Details: pt with slowed step-through gait   Stairs             Wheelchair Mobility    Modified Rankin (Stroke Patients Only) Modified Rankin (Stroke Patients Only) Pre-Morbid Rankin Score: Moderately severe disability Modified Rankin: Moderately severe disability     Balance Overall balance assessment: Needs assistance Sitting-balance support: No upper extremity supported, Feet supported Sitting balance-Leahy Scale: Fair     Standing balance support: Bilateral upper extremity supported, Reliant on assistive device for balance Standing balance-Leahy Scale: Poor                              Cognition Arousal/Alertness: Awake/alert Behavior During Therapy: Flat affect Overall Cognitive Status: No family/caregiver present to determine baseline cognitive functioning Area of Impairment: Problem solving                             Problem Solving: Slow processing          Exercises  General Comments General comments (skin integrity, edema, etc.): VSS on RA, pt reports feeling lightheaded initially with standing and prior to seated rest break      Pertinent Vitals/Pain Pain Assessment Pain Assessment: No/denies pain    Home Living                           Prior Function            PT Goals (current goals can now be found in the care plan section) Acute Rehab PT Goals Patient Stated Goal: none stated Progress towards PT goals: Progressing toward goals    Frequency    Min 4X/week      PT Plan Current plan remains appropriate    Co-evaluation              AM-PAC PT "6 Clicks" Mobility   Outcome Measure  Help needed turning from your back to your side while in a flat bed without using bedrails?: A Little Help needed moving from lying on your back to sitting on the side of a flat bed without using bedrails?: A Lot Help needed moving to and from a bed to a chair (including a wheelchair)?: A Little Help needed standing up from a chair using your arms (e.g., wheelchair or bedside chair)?: A Little Help needed to walk in hospital room?: A Little Help needed climbing 3-5 steps with a railing? : Total 6 Click Score: 15    End of Session   Activity Tolerance: Patient tolerated treatment well Patient left: in bed;with call bell/phone within reach;with bed alarm set Nurse Communication: Mobility status PT Visit Diagnosis: Muscle weakness (generalized) (M62.81)     Time: 9147-8295 PT Time Calculation (min) (ACUTE ONLY): 25 min  Charges:  $Gait Training: 8-22 mins $Therapeutic Activity: 8-22 mins                     Arlyss Gandy, PT, DPT Acute Rehabilitation Office [phone]'   Arlyss Gandy 03/09/2022, 1:52 PM

## 2022-03-09 NOTE — Progress Notes (Addendum)
Inpatient Rehab Admissions Coordinator:    I have insurance approval and a bed available for pt to admit to CIR today. Awaiting confirmation from Dr. Teryl Lucy that pt is ready.  TOC team and pt aware, will contact son once I can confirm plan to admit to CIR today.  12:56: I have approval from Dr. Teryl Lucy for pt to admit to CIR today.  Will touch base with son.    Shann Medal, PT, DPT Admissions Coordinator [phone] 03/09/22  10:20 AM

## 2022-03-09 NOTE — Discharge Summary (Signed)
Physician Discharge Summary   Patient: Steven Ward MRN: [id number] DOB: July 13, 1925  Admit date:     02/22/2022  Discharge date: 03/09/22  Discharge Physician: Jacquelin Hawking, MD   PCP: Kristian Covey, MD   Recommendations at discharge:  PCP for hospital follow-up Cardiology for atrial fibrillation follow-up Pulmonology for pulmonary mass and fibrosis follow-up (discussed with pulmonology group who will contact for follow-up  Discharge Diagnoses: Principal Problem:   Paroxysmal atrial fibrillation (HCC) Active Problems:   SBO (small bowel obstruction) (HCC)   Hypokalemia   Hypomagnesemia   Hypophosphatemia   Acute kidney injury superimposed on chronic kidney disease (HCC)   Metabolic acidosis   Acute CVA (cerebrovascular accident) (HCC)   Generalized weakness   Essential hypertension   Pneumonia   T2DM (type 2 diabetes mellitus) (HCC)   PD (Parkinson's disease) (HCC)   Hypothyroidism   BPH (benign prostatic hyperplasia)   Decubitus ulcer   Compression fracture of body of thoracic vertebra (HCC)   Pleural plaque   Pulmonary fibrosis (HCC)   Right lower lobe lung mass   Constipation   CKD stage 3 due to type 2 diabetes mellitus (HCC)   Nausea & vomiting   Stage 3b chronic kidney disease (CKD) (HCC)  Resolved Problems:   * No resolved hospital problems. *  Hospital Course: Steven Ward is a 88 y.o. male with medical history significant of paroxysmal A-fib not on anticoagulation, hypertension, hypothyroidism, Parkinson's disease, lumbar spondylosis, type 2 diabetes, hyperlipidemia, PVD, CKD stage IIIb, remote history of colon cancer, BPH, pancreatic insufficiency, gallstones, GERD. Patient presented secondary to generalized weakness and found to have evidence of atrial fibrillation, SBO and acute infarct. General surgery consulted; NPO with NG tube placed. Cardiology consulted and patient's rate managed with beta blocker. Neurology consulted for stroke. SBO  persistent and likely partial. PICC ordered for TPN starting 6/13.  Now tolerating regular diet and general surgery has signed off.  Currently awaiting CIR.  See below for additional details   Assessment and Plan: Paroxysmal atrial fibrillation Rate controlled. Cardiology consulted. Transthoracic Echocardiogram ordered and significant for severely dilated left atrium with normal LVEF. Anticoagulation initially deferred secondary to acute CVA, per cardiology. Started on metoprolol PO and Eliquis eventually started. Discharge on metoprolol and Eliquis.   Small bowel obstruction Noted on CT abdomen/pelvis on 6/7. General surgery consulted. Management with NG tube decompression. Obstruction resolved with conservative management.   Hypokalemia Resolved with potassium supplementation.   Hypomagnesemia Resolved with magnesium supplementation.   AKI on CKD stage IIIb Baseline creatinine of about 1.7-2. Creatinine of 2.47 on admission with peak of 2.5. Started on IV fluids. Back to baseline and has stabilized. Resolved.   Metabolic acidosis In setting of AKI on CKD. Mild. Resolved.   Acute CVA Acute punctate infarct noted on MRI located within the right cerebellar hemisphere in addition to possible subacute infarct of right frontal lobe. LDL of 34. Hemoglobin A1C of 7.7%. Transthoracic Echocardiogram with normal LVEF of 55-60% with no evidence of atrial level shunt. Neurology recommendations for Aspirin 81 mg, Eliquis and outpatient follow-up. Signed off. PT/OT recommending CIR   Generalized weakness PT recommending CIR. Consult placed.   Primary hypertension Patient is on hydrochlorothiazide and Lasix as an outpatient. Started on metoprolol inpatient. Continue metoprolol   Right upper lobe opacities Concerning for possible infection. Asymptomatic at this time. Started on Ceftriaxone and Flagyl for CAP/aspiration pneumonia. Patient treated for 6 days of Ceftriaxone. Asymptomatic.   Diabetes  mellitus, type 2 Hemoglobin A1C of  7.7%. Patient on glipizide.   Parkinson disease -Continue Sinemet CR   Hypothyroidism Continue Synthroid   BPH Continue tamsulosin   Chronic pancreatic insufficiency Continue Creon   T5 and T12 vertebral compression fracture Incidental finding. Chronic.    Aortic atherosclerosis Noted on CT imaging.   Bilateral pleural plaques Pulmonary fibrosis Per CT read, consistent with asbestos related pleural disease. Follow-up with pulmonology outpatient.   3.5 cm RLL mass Newly noted. Rounded atelectasis vs pulmonary neoplasm. Recommendation for follow-up in 3 months   Pressure injury Right perineum, POA. Continue wound care.      Consultants: General surgery, cardiology, neurology Procedures performed: None  Disposition:  Acute inpatient rehabilitation Diet recommendation: Regular diet DISCHARGE MEDICATION: Allergies as of 03/09/2022       Reactions   Amoxicillin    REACTION: rash, dizziness        Medication List     TAKE these medications    apixaban 2.5 MG Tabs tablet Commonly known as: ELIQUIS Take 1 tablet (2.5 mg total) by mouth 2 (two) times daily.   aspirin 81 MG chewable tablet Chew 1 tablet (81 mg total) by mouth daily. Start taking on: March 10, 2022   blood glucose meter kit and supplies Kit Dispense based on patient and insurance preference. Use up to four times daily as directed. (FOR ICD-9 250.00, 250.01).   Carbidopa-Levodopa ER 25-100 MG tablet controlled release Commonly known as: SINEMET CR TAKE 2 TABLETS BY MOUTH IN THE MORNING AND 2 IN THE AFTERNOON AND 1 IN THE EVENING What changed:  how much to take when to take this   feeding supplement Liqd Take 237 mLs by mouth 2 (two) times daily between meals. Start taking on: March 10, 2022   furosemide 20 MG tablet Commonly known as: LASIX Take one tablet by mouth once daily as needed for edema/swelling. What changed:  how much to take how to take  this when to take this additional instructions   glipiZIDE 5 MG tablet Commonly known as: GLUCOTROL Take one half tablet by mouth once daily. What changed:  how much to take how to take this when to take this additional instructions   hydrochlorothiazide 12.5 MG capsule Commonly known as: MICROZIDE TAKE 1 CAPSULE BY MOUTH EVERY DAY What changed: how much to take   Lancet Device Misc Use 1-4 times daily as needed or directed.  DX E11.9   levothyroxine 25 MCG tablet Commonly known as: SYNTHROID Take 1 tablet (25 mcg total) by mouth daily.   lipase/protease/amylase 12000-38000 units Cpep capsule Commonly known as: CREON Take 12,000 Units by mouth 3 (three) times daily before meals.   metoprolol tartrate 25 MG tablet Commonly known as: LOPRESSOR Take 0.5 tablets (12.5 mg total) by mouth 2 (two) times daily.   ondansetron 4 MG disintegrating tablet Commonly known as: ZOFRAN-ODT Take 1 tablet (4 mg total) by mouth every 8 (eight) hours as needed for nausea or vomiting.   ONE TOUCH ULTRA 2 w/Device Kit Use as directed.   OneTouch Ultra test strip Generic drug: glucose blood USE TO TEST BLOOD SUGAR 3 TIMES A DAY   onetouch ultrasoft lancets Check 3 times daily. E11.9   polyethylene glycol 17 g packet Commonly known as: MIRALAX / GLYCOLAX Take 17 g by mouth daily as needed for moderate constipation.   tamsulosin 0.4 MG Caps capsule Commonly known as: FLOMAX TAKE 1 CAPSULE BY MOUTH EVERY DAY What changed: how to take this   triamcinolone cream 0.1 % Commonly known as:  KENALOG APPLY  CREAM EXTERNALLY TO AFFECTED AREA TWICE DAILY AS NEEDED What changed:  how much to take when to take this reasons to take this additional instructions               Discharge Care Instructions  (From admission, onward)           Start     Ordered   03/09/22 0000  Discharge wound care:       Comments: 1. Wound care to sacral Unstageable pressure injury: Cleanse with  NS, pat gently dry. Apply Medihoney to wound, top with saline moistened gauze 2x2, top with silicone foam for sacrum placed with "tip" pointing away from the anus. Apply Medihoney daily, may reuse foam for up to 3 days. Change PRN soiling.   2. Foam dressing to inner gluteal fold, change Q 3 days or PRN soiling   03/09/22 1402            Follow-up Information     Burchette, Elberta Fortis, MD Follow up.   Specialty: Family Medicine Contact information: [address] [place] [address] [phone]         [facility] Follow up.   Specialty: Pulmonology Why: Abnormal CT chest concerning for absestos related lung disease in addition to nodule. Contact information: [address] [place] [address] [phone]        Micki Riley, MD. Schedule an appointment as soon as possible for a visit in 4 week(s).   Specialties: Neurology, Radiology Why: Stroke follow-up Contact information: [address] [place] [address] [phone]                Discharge Exam: BP (!) 134/48   Pulse 86   Temp 98 F (36.7 C) (Oral)   Resp 18   Ht 5\' 8"  (1.727 m)   Wt 81.4 kg   SpO2 96%   BMI 27.29 kg/m   General exam: Appears calm and comfortable Respiratory system: Clear to auscultation. Respiratory effort normal. Cardiovascular system: S1 & S2 heard. Gastrointestinal system: Abdomen is distended, soft and nontender. No organomegaly or masses felt. Normal bowel sounds heard. Central nervous system: Alert and oriented. No focal neurological deficits. Musculoskeletal: BLE edema. No calf tenderness Skin: No cyanosis. No rashes Psychiatry: Judgement and insight appear normal. Mood & affect appropriate.   Condition at discharge: stable  The results of significant diagnostics from this hospitalization (including imaging, microbiology, ancillary and laboratory) are listed below for reference.   Imaging  Studies: DG Chest Port 1 View  Result Date: 02/28/2022 CLINICAL DATA:  PICC placement EXAM: PORTABLE CHEST 1 VIEW COMPARISON:  02/23/2022 FINDINGS: Mild cardiomegaly. Right upper extremity PICC, tip positioned near the superior cavoatrial junction. Esophagogastric tube with tip and side port below the diaphragm. Trace bilateral pleural effusions. Calcified bilateral pulmonary nodules and pleural plaques. IMPRESSION: 1. Right upper extremity PICC, tip positioned near the superior cavoatrial junction. 2. Trace bilateral pleural effusions. 3. Esophagogastric tube with tip and side port below the diaphragm. Electronically Signed   By: Jearld Lesch M.D.   On: 02/28/2022 14:46   [place] EKG SITE RITE  Result Date: 02/28/2022 If Site Rite image not attached, placement could not be confirmed due to current cardiac rhythm.  DG Abd Portable 1V  Result Date: 02/28/2022 CLINICAL DATA:  Follow-up small bowel obstruction EXAM: PORTABLE ABDOMEN - 1 VIEW COMPARISON:  02/27/2022.  02/26/2022.  02/25/2022 FINDINGS: Previously seen contrast present throughout the colon.  Few dilated loops of small bowel main evident, but the trend remains favorable. Maximal dimension is 5 cm in the left central abdomen. Nasogastric tube tip in the stomach. IMPRESSION: Previously administered contrast present throughout the colon, making slow progression. Continued improvement of the small bowel gas pattern, but with a few persistent gas-filled dilated loops. Electronically Signed   By: Paulina Fusi M.D.   On: 02/28/2022 08:10   DG Abd Portable 1V  Result Date: 02/27/2022 CLINICAL DATA:  Follow-up small bowel obstruction. EXAM: PORTABLE ABDOMEN - 1 VIEW COMPARISON:  02/26/2022 FINDINGS: Nasogastric tube is partially visualized in the stomach although the distal tip is not seen. Mild small bowel dilatation shows no significant change. Small amount of oral contrast material as well as gas is seen throughout the colon. These findings are  consistent with an ileus, without significant change. IMPRESSION: Ileus pattern, without significant change. Nasogastric tube partially visualized in stomach. Electronically Signed   By: Danae Orleans M.D.   On: 02/27/2022 08:07   DG Abd Portable 1V  Result Date: 02/26/2022 CLINICAL DATA:  NG tube placement EXAM: PORTABLE ABDOMEN - 1 VIEW COMPARISON:  Radiograph 02/25/2022 FINDINGS: NG tube with tip in the gastric fundus. Side port below the GE junction. Gas-filled loops of colon and small bowel noted. RIGHT basilar atelectasis. IMPRESSION: NG tube in gastric fundus Electronically Signed   By: Genevive Bi M.D.   On: 02/26/2022 16:17   DG Abd Portable 1V  Result Date: 02/25/2022 CLINICAL DATA:  Abdominal distention EXAM: PORTABLE ABDOMEN - 1 VIEW COMPARISON:  Previous studies including the examination of 02/24/2022 FINDINGS: There is abnormal dilation of small-bowel loops measuring up to 6.1 cm in maximum diameter. Tip NG tube is seen in the stomach. Colon is not distended. Overall, no significant interval changes are noted. Surgical clips are seen in the right upper quadrant. IMPRESSION: Findings suggest partial small bowel obstruction with no significant interval change. Electronically Signed   By: Ernie Avena M.D.   On: 02/25/2022 11:08   VAS US CAROTID  Result Date: 02/24/2022 Carotid Arterial Duplex Study Patient Name:  TAVYON MOSKWA  Date of Exam:   02/24/2022 Medical Rec #: [id number]         Accession #:    [id number] Date of Birth: [birth year]         Patient Gender: M Patient Age:   72 years Exam Location:  [facility] Procedure:      VAS US CAROTID Referring Phys: Gevena Mart --------------------------------------------------------------------------------  Indications:       CVA. Risk Factors:      Hypertension, Diabetes, past history of smoking, prior CVA. Comparison Study:  No previous study to compare. Performing Technologist: Magdalene River  Examination Guidelines: A  complete evaluation includes B-mode imaging, spectral Doppler, color Doppler, and power Doppler as needed of all accessible portions of each vessel. Bilateral testing is considered an integral part of a complete examination. Limited examinations for reoccurring indications may be performed as noted.  Right Carotid Findings: +----------+--------+--------+--------+------------------+--------+           PSV cm/sEDV cm/sStenosisPlaque DescriptionComments +----------+--------+--------+--------+------------------+--------+ CCA Prox  81      9                                          +----------+--------+--------+--------+------------------+--------+ CCA Distal36      12                                         +----------+--------+--------+--------+------------------+--------+  ICA Prox  40      10              heterogenous               +----------+--------+--------+--------+------------------+--------+ ICA Distal55      17                                         +----------+--------+--------+--------+------------------+--------+ ECA       83      0                                          +----------+--------+--------+--------+------------------+--------+ +----------+--------+-------+----------------+-------------------+           PSV cm/sEDV cmsDescribe        Arm Pressure (mmHG) +----------+--------+-------+----------------+-------------------+ Subclavian109            Multiphasic, WNL                    +----------+--------+-------+----------------+-------------------+ +---------+--------+--+--------+-+---------+ VertebralPSV cm/s32EDV cm/s6Antegrade +---------+--------+--+--------+-+---------+  Left Carotid Findings: +----------+--------+--------+--------+------------------+--------+           PSV cm/sEDV cm/sStenosisPlaque DescriptionComments +----------+--------+--------+--------+------------------+--------+ CCA Prox  68      10                                          +----------+--------+--------+--------+------------------+--------+ CCA Distal64      11                                         +----------+--------+--------+--------+------------------+--------+ ICA Prox  63      12              heterogenous               +----------+--------+--------+--------+------------------+--------+ ICA Distal101     21                                         +----------+--------+--------+--------+------------------+--------+ ECA       78      0               heterogenous               +----------+--------+--------+--------+------------------+--------+ +----------+--------+--------+----------------+-------------------+           PSV cm/sEDV cm/sDescribe        Arm Pressure (mmHG) +----------+--------+--------+----------------+-------------------+ Subclavian                Multiphasic, WNL                    +----------+--------+--------+----------------+-------------------+ +---------+--------+--+--------+--+---------+ VertebralPSV cm/s46EDV cm/s11Antegrade +---------+--------+--+--------+--+---------+   Summary: Right Carotid: Velocities in the right ICA are consistent with a 1-39% stenosis. Left Carotid: Velocities in the left ICA are consistent with a 1-39% stenosis. Vertebrals:  Bilateral vertebral arteries demonstrate antegrade flow. Subclavians: Normal flow hemodynamics were seen in bilateral subclavian              arteries. *See table(s) above for measurements and observations.  Electronically signed by Janalyn Shy  Sethi MD on 02/24/2022 at 12:18:10 PM.    Final    DG Abd 1 View  Result Date: 02/24/2022 CLINICAL DATA:  Small bowel delay EXAM: ABDOMEN - 1 VIEW COMPARISON:  02/23/2022 abdominal radiograph FINDINGS: Enteric tube terminates in the gastric fundus. Persistent moderate diffuse small bowel dilatation, similar to slightly worsened. Oral contrast has progressed to the right and left colon. No evidence of  pneumatosis or pneumoperitoneum. Vertical surgical clips again noted to the right of the lower spine. Marked lumbar spondylosis. Cholecystectomy clips are seen in the right upper quadrant of the abdomen. IMPRESSION: 1. Enteric tube terminates in the gastric fundus. 2. Progression of oral contrast to the colon suggests improving distal small bowel obstruction, although the degree of diffuse small bowel dilatation appears slightly worsened. Electronically Signed   By: Delbert Phenix M.D.   On: 02/24/2022 08:40   DG Abd Portable 1V-Small Bowel Obstruction Protocol-initial, 8 hr delay  Result Date: 02/23/2022 CLINICAL DATA:  8 hour small-bowel follow-up film EXAM: PORTABLE ABDOMEN - 1 VIEW COMPARISON:  Film from earlier in the same day. FINDINGS: Gastric catheter is again seen in the stomach. Changes of small-bowel obstruction are again noted. Previously administered contrast again lies within the small bowel without significant colonic contrast. Continued follow-up is recommended. IMPRESSION: Persistent small bowel obstruction with contrast in the small bowel. Continued follow-up is recommended. Electronically Signed   By: Alcide Clever M.D.   On: 02/23/2022 19:58   DG Abd 1 View  Result Date: 02/23/2022 CLINICAL DATA:  Check gastric catheter placement EXAM: ABDOMEN - 1 VIEW COMPARISON:  Film from earlier in the same day. FINDINGS: Small bowel dilatation is noted which is slightly greater than that seen on the prior exam consistent with small bowel obstruction. Gastric catheter remains in satisfactory position. Contrast material was administered and lies within the stomach as well as the small bowel. No definitive colonic contrast is noted. Continued follow-up is recommended. IMPRESSION: Changes consistent with worsening small bowel obstruction. Gastric catheter is noted in place. Administered contrast shows within the small bowel. No colonic contrast is seen at this time. Continued follow-up is recommended.  Electronically Signed   By: Alcide Clever M.D.   On: 02/23/2022 19:28   ECHOCARDIOGRAM COMPLETE  Result Date: 02/23/2022    ECHOCARDIOGRAM REPORT   Patient Name:   EGON DIVELBISS Date of Exam: 02/23/2022 Medical Rec #:  [id number]        Height:       68.0 in Accession #:    [id number]       Weight:       144.0 lb Date of Birth:  May 19, 1925        BSA:          1.777 m Patient Age:    88 years         BP:           110/68 mmHg Patient Gender: M                HR:           88 bpm. Exam Location:  Inpatient Procedure: 2D Echo, Cardiac Doppler and Color Doppler Indications:    Stroke I63.9  History:        Patient has prior history of Echocardiogram examinations, most                 recent 07/19/2011. Risk Factors:GERD, Hypertension and Diabetes.  Sonographer:    Eulah Pont RDCS Referring Phys:  [id number] VASUNDHRA RATHORE IMPRESSIONS  1. Left ventricular ejection fraction, by estimation, is 55 to 60%. The left ventricle has normal function. The left ventricle has no regional wall motion abnormalities. There is mild concentric left ventricular hypertrophy. Left ventricular diastolic function could not be evaluated.  2. Right ventricular systolic function is normal. The right ventricular size is normal. There is normal pulmonary artery systolic pressure.  3. Left atrial size was severely dilated.  4. The mitral valve is normal in structure. Trivial mitral valve regurgitation. No evidence of mitral stenosis.  5. The aortic valve is calcified. There is mild calcification of the aortic valve. There is mild thickening of the aortic valve. Aortic valve regurgitation is not visualized. No aortic stenosis is present.  6. The inferior vena cava is normal in size with greater than 50% respiratory variability, suggesting right atrial pressure of 3 mmHg. FINDINGS  Left Ventricle: Left ventricular ejection fraction, by estimation, is 55 to 60%. The left ventricle has normal function. The left ventricle has no regional wall  motion abnormalities. The left ventricular internal cavity size was normal in size. There is  mild concentric left ventricular hypertrophy. Left ventricular diastolic function could not be evaluated due to atrial fibrillation. Left ventricular diastolic function could not be evaluated. Right Ventricle: The right ventricular size is normal. No increase in right ventricular wall thickness. Right ventricular systolic function is normal. There is normal pulmonary artery systolic pressure. The tricuspid regurgitant velocity is 2.08 m/s, and  with an assumed right atrial pressure of 3 mmHg, the estimated right ventricular systolic pressure is 20.3 mmHg. Left Atrium: Left atrial size was severely dilated. Right Atrium: Right atrial size was normal in size. Pericardium: There is no evidence of pericardial effusion. Mitral Valve: The mitral valve is normal in structure. Mild mitral annular calcification. Trivial mitral valve regurgitation. No evidence of mitral valve stenosis. MV peak gradient, 5.2 mmHg. The mean mitral valve gradient is 2.0 mmHg. Tricuspid Valve: The tricuspid valve is normal in structure. Tricuspid valve regurgitation is trivial. No evidence of tricuspid stenosis. Aortic Valve: The aortic valve is calcified. There is mild calcification of the aortic valve. There is mild thickening of the aortic valve. Aortic valve regurgitation is not visualized. No aortic stenosis is present. Pulmonic Valve: The pulmonic valve was normal in structure. Pulmonic valve regurgitation is not visualized. No evidence of pulmonic stenosis. Aorta: The aortic root is normal in size and structure. Venous: The inferior vena cava is normal in size with greater than 50% respiratory variability, suggesting right atrial pressure of 3 mmHg. IAS/Shunts: No atrial level shunt detected by color flow Doppler.  LEFT VENTRICLE PLAX 2D LVIDd:         4.30 cm LVIDs:         3.00 cm LV PW:         1.30 cm LV IVS:        1.20 cm LVOT diam:     2.00  cm LV SV:         72 LV SV Index:   40 LVOT Area:     3.14 cm  LV Volumes (MOD) LV vol d, MOD A2C: 74.3 ml LV vol d, MOD A4C: 60.6 ml LV vol s, MOD A2C: 27.9 ml LV vol s, MOD A4C: 22.0 ml LV SV MOD A2C:     46.4 ml LV SV MOD A4C:     60.6 ml LV SV MOD BP:      44.7 ml RIGHT VENTRICLE RV S  prime:     9.98 cm/s TAPSE (M-mode): 1.8 cm LEFT ATRIUM             Index        RIGHT ATRIUM           Index LA diam:        3.90 cm 2.19 cm/m   RA Area:     17.00 cm LA Vol (A2C):   78.9 ml 44.40 ml/m  RA Volume:   42.40 ml  23.86 ml/m LA Vol (A4C):   84.7 ml 47.66 ml/m LA Biplane Vol: 90.5 ml 50.92 ml/m  AORTIC VALVE LVOT Vmax:   121.00 cm/s LVOT Vmean:  84.733 cm/s LVOT VTI:    0.228 m  AORTA Ao Root diam: 3.40 cm Ao Asc diam:  3.10 cm MITRAL VALVE              TRICUSPID VALVE MV Area (PHT): 2.47 cm   TR Peak grad:   17.3 mmHg MV Area VTI:   2.41 cm   TR Vmax:        208.00 cm/s MV Peak grad:  5.2 mmHg MV Mean grad:  2.0 mmHg   SHUNTS MV Vmax:       1.14 m/s   Systemic VTI:  0.23 m MV Vmean:      53.5 cm/s  Systemic Diam: 2.00 cm Chilton Si MD Electronically signed by Chilton Si MD Signature Date/Time: 02/23/2022/4:19:50 PM    Final    DG Abd Portable 1V-Small Bowel Protocol-Position Verification  Result Date: 02/23/2022 CLINICAL DATA:  Nasogastric tube placement EXAM: PORTABLE ABDOMEN - 1 VIEW COMPARISON:  Radiograph upper abdomen earlier same day FINDINGS: Interval advancement of nasogastric tube with side port just distal to the gastroesophageal junction and tip overlying the stomach in left upper quadrant. Air is again seen within dilated loops of small bowel, noting a right lower quadrant transition point on yesterday's CT indicating mid to distal small bowel obstruction. Cholecystectomy clips. IMPRESSION: 1. Nasogastric tube in appropriate position. 2. Persistent dilated loops of small bowel as on yesterday's CT that demonstrated small-bowel obstruction status post in this patient with history of  colectomy. Electronically Signed   By: Neita Garnet M.D.   On: 02/23/2022 08:37   DG CHEST PORT 1 VIEW  Result Date: 02/23/2022 CLINICAL DATA:  Check gastric catheter placement EXAM: PORTABLE CHEST 1 VIEW COMPARISON:  Abdomen film from earlier in the same day. FINDINGS: Gastric catheter now extends mildly into the stomach. No looping is noted within the esophagus. Cardiac shadow is within normal limits. Calcified granulomas are noted bilaterally. IMPRESSION: Gastric catheter now noted within the stomach. No looping in the esophagus is seen. Electronically Signed   By: Alcide Clever M.D.   On: 02/23/2022 03:21   DG Abd 1 View  Result Date: 02/23/2022 CLINICAL DATA:  Check gastric catheter placement EXAM: ABDOMEN - 1 VIEW COMPARISON:  Film from earlier in the same day FINDINGS: Gastric catheter is stable in appearance when compared with the prior exam it should be advanced deeper into the stomach. If significant advancement has been performed, chest x-ray is recommended to evaluate for possible looping within the proximal esophagus. IMPRESSION: Gastric catheter is stable from the prior exam. Chest x-ray may be helpful as described above. Electronically Signed   By: Alcide Clever M.D.   On: 02/23/2022 02:34   DG Abd 1 View  Result Date: 02/23/2022 CLINICAL DATA:  Check gastric catheter placement EXAM: ABDOMEN - 1 VIEW COMPARISON:  None Available. FINDINGS: Scattered large and small bowel gas is noted. Gastric catheter is noted with the tip at the expected level of the gastroesophageal junction. This should be advanced deeper into the stomach. IMPRESSION: Gastric catheter at the expected location of the gastroesophageal junction. This should be advanced deeper into the stomach. Electronically Signed   By: Alcide Clever M.D.   On: 02/23/2022 00:31   MR BRAIN WO CONTRAST  Result Date: 02/22/2022 CLINICAL DATA:  Acute neurologic deficit EXAM: MRI HEAD WITHOUT CONTRAST TECHNIQUE: Multiplanar, multiecho pulse  sequences of the brain and surrounding structures were obtained without intravenous contrast. COMPARISON:  None Available. FINDINGS: Brain: Punctate acute infarct within the right cerebellar hemisphere. There is mildly hyperintense signal on diffusion-weighted imaging in subcortical right frontal lobe without a clear ADC correlate. No acute or chronic hemorrhage. There is multifocal hyperintense T2-weighted signal within the white matter. Generalized cerebral volume loss. The midline structures are normal. Vascular: Major flow voids are preserved. Skull and upper cervical spine: Normal calvarium and skull base. Visualized upper cervical spine and soft tissues are normal. Sinuses/Orbits:Right mastoid effusion. Paranasal sinuses are clear. Normal orbits. IMPRESSION: 1. Punctate acute infarct within the right cerebellar hemisphere. No hemorrhage or mass effect. 2. Mildly hyperintense signal on diffusion-weighted imaging in the subcortical right frontal lobe without a clear ADC correlate. This may indicate a subacute infarct. 3. Chronic small vessel ischemic disease and cerebral volume loss. Electronically Signed   By: Deatra Robinson M.D.   On: 02/22/2022 21:46   CT CHEST ABDOMEN PELVIS WO CONTRAST  Result Date: 02/22/2022 CLINICAL DATA:  Inpatient. Abdominal distension. Poor p.o. intake. Generalized weakness. Or abnormal chest radiograph with possible pulmonary nodule. Remote history of resected left colon cancer in 1993 per prior report. * Tracking Code: BO * EXAM: CT CHEST, ABDOMEN AND PELVIS WITHOUT CONTRAST TECHNIQUE: Multidetector CT imaging of the chest, abdomen and pelvis was performed following the standard protocol without IV contrast. RADIATION DOSE REDUCTION: This exam was performed according to the departmental dose-optimization program which includes automated exposure control, adjustment of the mA and/or kV according to patient size and/or use of iterative reconstruction technique. COMPARISON:  Chest  radiograph from earlier today. 06/30/2009 CT abdomen/pelvis. FINDINGS: CT CHEST FINDINGS Cardiovascular: Normal heart size. No significant pericardial effusion/thickening. Three-vessel coronary atherosclerosis. Atherosclerotic nonaneurysmal thoracic aorta. Normal caliber pulmonary arteries. Mediastinum/Nodes: No discrete thyroid nodules. Unremarkable esophagus. No pathologically enlarged axillary, mediastinal or hilar lymph nodes, noting limited sensitivity for the detection of hilar adenopathy on this noncontrast study. Lungs/Pleura: No pneumothorax. Scattered calcified bilateral pleural plaques anteriorly and posteriorly. Trace bilateral pleural effusions, right greater than left. Ovoid 3.5 x 2.4 cm subpleural focus of consolidation in the dependent basilar right lower lobe (series 5/image 103), new from 2010 CT. Patchy confluent subpleural reticulation and ground-glass opacity with associated mild traction bronchiolectasis at both lung bases, mildly increased from prior CT. Mild-to-moderate patchy tree-in-bud opacities in the dependent basilar right upper lobe. No additional significant pulmonary nodules. Musculoskeletal: No aggressive appearing focal osseous lesions. Healed anterolateral right seventh through tenth rib fractures. Moderate T5 vertebral compression fracture of indeterminate chronicity and chronic severe T12 vertebral compression fracture. Mild thoracic spondylosis. CT ABDOMEN PELVIS FINDINGS Hepatobiliary: Normal liver with no liver mass. Cholecystectomy. Bile ducts are within normal post cholecystectomy limits with CBD diameter 7 mm. Pancreas: Progressive coarse calcifications throughout the atrophic pancreas with irregular dilated main pancreatic duct up to 7 mm diameter, compatible with chronic pancreatitis. No discrete pancreatic mass. No peripancreatic fluid collections. Spleen: Normal  size. No mass. Adrenals/Urinary Tract: Normal adrenals. No renal stones. No hydronephrosis. Simple 1.2 cm  posterior upper left renal cyst for which no follow-up is recommended. No additional contour deforming renal masses. Normal bladder. Stomach/Bowel: Mildly distended stomach with air-fluid level. No gastric wall thickening. Diffuse dilatation of the proximal to mid small bowel with air-fluid levels, measuring up to 4.8 cm diameter. Distal small bowel is collapsed. Apparent focal small bowel caliber transition in the right abdomen (series 6/image 38). There is associated fat stranding and ill-defined fluid throughout the small bowel mesentery. There is some mesenteric twisting at the site of the caliber transition. No definite bowel wall thickening or pneumatosis. Normal appendix. Postsurgical change from partial distal colectomy with intact appearing colorectal anastomosis. No large bowel wall thickening, diverticulosis or significant pericolonic fat stranding. Vascular/Lymphatic: Atherosclerotic nonaneurysmal abdominal aorta. No pathologically enlarged lymph nodes in the abdomen or pelvis. Reproductive: Normal size prostate. No pneumoperitoneum. Small volume ascites. No focal fluid collection. Other: No pneumoperitoneum, ascites or focal fluid collection. Surgical clips noted throughout the midline ventral abdominal wall. Musculoskeletal: No aggressive appearing focal osseous lesions. Marked lumbar spondylosis. IMPRESSION: 1. Mechanical mid to distal small-bowel obstruction with apparent focal small bowel caliber transition in the right abdomen, with associated fat stranding and ill-defined fluid throughout the small bowel mesentery and with some mesenteric twisting, raising the possibility of internal hernia. No definite bowel wall thickening. No pneumatosis. No free air. 2. Small volume ascites. 3. Bilateral calcified pleural plaques and trace bilateral pleural effusions, compatible with asbestos related pleural disease. Basilar predominant pulmonary fibrosis compatible with asbestosis. 4. Ovoid 3.5 cm subpleural  focus of consolidation in the dependent basilar right lower lobe, new from 2010 CT, indeterminate for rounded atelectasis versus pulmonary neoplasm. Suggest attention on follow-up chest CT in 3 months. 5. Mild-to-moderate patchy tree-in-bud opacities in the dependent basilar right upper lobe, compatible with nonspecific infectious or inflammatory bronchiolitis, with the differential including aspiration. 6. Chronic pancreatitis. 7. Three-vessel coronary atherosclerosis. 8. Moderate T5 vertebral compression fracture of indeterminate chronicity and chronic severe T12 vertebral compression fracture. 9. Aortic Atherosclerosis (ICD10-I70.0). These results will be called to the ordering clinician or representative by the Radiologist Assistant, and communication documented in the PACS or [employer]. Electronically Signed   By: Delbert Phenix M.D.   On: 02/22/2022 21:39   DG Chest Port 1 View  Result Date: 02/22/2022 CLINICAL DATA:  Shortness of breath.  Weakness and fatigue. EXAM: PORTABLE CHEST 1 VIEW COMPARISON:  06/27/2011 FINDINGS: Suspected calcified granuloma in the left upper lobe, also present back on 2012. Indistinct 4.3 by 1.6 cm left upper lobe bandlike density, possibly at least partially from superimposition of vascular shadows and the first rib end, but an underlying pulmonary nodule cannot be readily excluded. Atherosclerotic calcification of the aortic arch. Upper normal heart size given the AP technique. Linear subsegmental atelectasis or scarring at the lung bases. Mitral valve calcification. Right lower lateral rib deformities including the ninth rib, compatible with old healed fractures. Subtle interstitial accentuation in the lungs is nonspecific. IMPRESSION: 1. Indistinct bandlike density at the left lung apex, possibly from scarring or atelectasis or confluence of vascular and osseous shadows, but an underlying pulmonary nodule cannot be excluded and accordingly CT of the chest is recommended.  2. Mitral valve calcification. Aortic Atherosclerosis (ICD10-I70.0). 3. Old granulomatous disease. 4. Mild bibasilar scarring. 5. Interval but healed right lower lateral rib fractures. 6. Faint interstitial accentuation in the lungs, nonspecific. Electronically Signed   By: Gaylyn Rong  M.D.   On: 02/22/2022 09:41   CT Head Wo Contrast  Result Date: 02/22/2022 CLINICAL DATA:  Neuro deficit, acute, stroke suspected EXAM: CT HEAD WITHOUT CONTRAST TECHNIQUE: Contiguous axial images were obtained from the base of the skull through the vertex without intravenous contrast. RADIATION DOSE REDUCTION: This exam was performed according to the departmental dose-optimization program which includes automated exposure control, adjustment of the mA and/or kV according to patient size and/or use of iterative reconstruction technique. COMPARISON:  None Available. FINDINGS: Brain: No evidence of acute large vascular territory infarction, hemorrhage, hydrocephalus, extra-axial collection or mass lesion/mass effect. Patchy white matter hypodensities, nonspecific but compatible with chronic microvascular disease. Cerebral atrophy. Vascular: No hyperdense vessel identified. Calcific intracranial atherosclerosis. Skull: No acute fracture. Sinuses/Orbits: Minimal paranasal sinus mucosal thickening. No acute orbital findings. Other: Trace right mastoid effusion. IMPRESSION: No evidence of acute intracranial abnormality. Electronically Signed   By: Feliberto Harts M.D.   On: 02/22/2022 08:57    Microbiology: Results for orders placed or performed in visit on 02/03/22  WOUND CULTURE     Status: None   Collection Time: 02/03/22 11:06 AM   Specimen: Wound  Result Value Ref Range Status   MICRO NUMBER: [id number]  Final   SPECIMEN QUALITY: Adequate  Final   SOURCE: NOT GIVEN  Final   STATUS: FINAL  Final   GRAM STAIN:   Final    Rare White blood cells seen No epithelial cells seen Few Gram positive cocci in clusters    RESULT:   Final    Growth of skin flora (note: Growth does not include S. aureus, beta-hemolytic Streptococci or P. aeruginosa).   COMMENT:   Final    No source was provided. The specimen was tested and reported based upon the test code ordered. If this is incorrect, please contact client services.    Labs: CBC: Recent Labs  Lab 03/03/22 0314 03/05/22 0353 03/06/22 0403 03/07/22 0839  WBC 11.8* 8.0 8.3 7.5  NEUTROABS 9.7* 6.0 6.1  --   HGB 9.8* 9.4* 8.7* 9.6*  HCT 28.9* 27.6* 26.2* 28.2*  MCV 97.0 96.2 98.5 97.9  PLT 104* 149* 176 217   Basic Metabolic Panel: Recent Labs  Lab 03/03/22 0314 03/04/22 0224 03/05/22 0353 03/06/22 0403 03/08/22 0532  NA 137 132* 137 137 139  K 3.7 4.2 4.6 3.9 4.1  CL 109 104 107 108 112*  CO2 22 18* 23 21* 22  GLUCOSE 82 215* 190* 111* 136*  BUN 41* 41* 43* 42* 35*  CREATININE 1.61* 1.50* 1.67* 1.79* 1.62*  CALCIUM 8.0* 7.8* 8.0* 7.6* 7.7*  MG 2.2 2.0 1.9 1.7  --   PHOS 2.4* 3.7 3.6 3.5  --    Liver Function Tests: Recent Labs  Lab 03/03/22 0314 03/05/22 0353 03/06/22 0403  AST 24 19 19   ALT 5 6 5   ALKPHOS 50 62 52  BILITOT 0.7 0.7 0.7  PROT 4.8* 5.1* 5.1*  ALBUMIN 1.8* 1.8* 1.7*   CBG: Recent Labs  Lab 03/08/22 1158 03/08/22 1552 03/08/22 2115 03/09/22 0619 03/09/22 1157  GLUCAP 90 88 159* 147* 169*    Discharge time spent: 35 minutes.  Signed: Jacquelin Hawking, MD [facility] 03/09/2022

## 2022-03-09 NOTE — Discharge Instructions (Signed)
Steven Ward,  You were here mainly with a bowel obstruction. Thankfully this has improved with conservative management. You were also found to have atrial fibrillation with fast heart rates and a stroke. You have been started on a blood thinner and medication to slow your heart rate.

## 2022-03-09 NOTE — Progress Notes (Signed)
PMR Admission Coordinator Pre-Admission Assessment   Patient: Steven Ward is an 88 y.o., male MRN: [id number] DOB: Feb 21, 1925 Height: _0  (172.7 cm) Weight: 81.4 kg   Insurance Information HMO:     PPO: yes     PCP:      IPA:      80/20:      OTHER:  PRIMARY: [employer]      Policy#: [id number]      Subscriber: pt CM Name: Lynelle Smoke      Phone#: [phone]     Fax#: Epic Access Pre-Cert#: [id number] auth for CIR from [place] with [employer], updates due every 7 days, they have EPIC access.       Employer:  Benefits:  Phone #: [phone]     Name:  Eff. Date: 09/18/21     Deduct: $0      Out of Pocket Max: $3200 (met $50)      Life Max: n/a CIR: $295/day for days 1-6      SNF: 20 full days Outpatient:      Co-Pay: $15/visit Home Health: 100%      Co-Pay:  DME: 80%     Co-Pay: 20% Providers: preferred network SECONDARY:       Policy#:      Phone#:    Development worker, community:       Phone#:    The Engineer, petroleum" for patients in Inpatient Rehabilitation Facilities with attached "Privacy Act [place] Records" was provided and verbally reviewed with: Patient and Family   Emergency Contact Information Contact Information       Name Relation Home Work Mobile    [place] Son   [phone]             Current Medical History  Patient Admitting Diagnosis: CVA, SBO   History of Present Illness: Pt is a 88 y/o male with PMH Of paroxysmal a-fib (not on anticoagulation at baseline), HTN, Parkinson's, DM, PVD, CKD IIIb, colon cancer, BPH, pancreatic insufficiency, and GERD, admitted to [facility] on 02/22/22 with complaints of generalized weakness.  ED workup revealed pt to be in A-fib with SBO and acute R cerebellar infarct.  Consulted for cardiology, general surgery, and neurology.  Cardiology ordered TEE which showed severely dilated left atrium with normal LVEF, anticoagulation initially deferred and tp started on metoprolol for rate control.  General  surgery consulted for SBO and pt underwent 2 rounds of SBO protocol with NGT for decompression.  Pt briefly on TPN, now tolerating regular diet and NGT d/c'd.  Neurology consulted for cerebellar infarct and recommended DAPT aspirin 81 mg and Eliquis with outpatient neurology follow up.  Therapy ongoing and pt was recommended for CIR due to functional decline.    Complete NIHSS TOTAL: 0   [facility] record from [facility] has been reviewed by the rehabilitation admission coordinator and physician.   Past Medical History      Past Medical History:  Diagnosis Date   ABNORMAL THYROID FUNCTION TESTS 01/17/2010   ALLERGIC RHINITIS 12/01/2008   Colon cancer ([place])     COLONIC POLYPS, HX OF 12/01/2008   DIABETES MELLITUS, TYPE II 12/01/2008   EDEMA LEG 05/04/2009   ESSENTIAL HYPERTENSION 12/01/2008   Exocrine pancreatic insufficiency     GALLSTONES 04/07/2009   GERD 12/01/2008   Hay fever     SPONDYLOSIS, LUMBAR 12/01/2008      Has the patient had major surgery during 100 days prior to admission? No   Family History  family history includes Cancer in his mother and another family member; Diabetes in an other family member; Stroke in an other family member.   Current Medications   Current Facility-Administered Medications:    acetaminophen (TYLENOL) tablet 650 mg, 650 mg, Oral, Q6H PRN **OR** acetaminophen (TYLENOL) suppository 650 mg, 650 mg, Rectal, Q6H PRN, Shela Leff, MD   apixaban (ELIQUIS) tablet 2.5 mg, 2.5 mg, Oral, BID, Pham, Minh Q, RPH-CPP, 2.5 mg at 03/09/22 0844   aspirin chewable tablet 81 mg, 81 mg, Oral, Daily, Beulah Gandy A, NP, 81 mg at 03/09/22 0844   Carbidopa-Levodopa ER (SINEMET CR) 25-100 MG tablet controlled release 1 tablet, 1 tablet, Oral, QPM, Orma Flaming, MD, 1 tablet at 03/08/22 1720   Carbidopa-Levodopa ER (SINEMET CR) 25-100 MG tablet controlled release 2 tablet, 2 tablet, Oral, BID, Orma Flaming, MD, 2 tablet at 03/09/22 1240   feeding  supplement (ENSURE ENLIVE / ENSURE PLUS) liquid 237 mL, 237 mL, Oral, BID BM, Elodia Florence., MD, 237 mL at 03/09/22 1243   insulin aspart (novoLOG) injection 0-15 Units, 0-15 Units, Subcutaneous, TID WC, Donnamae Jude, RPH, 3 Units at 03/09/22 1241   insulin aspart (novoLOG) injection 0-5 Units, 0-5 Units, Subcutaneous, QHS, Chen, Lydia D, RPH   insulin aspart (novoLOG) injection 2 Units, 2 Units, Subcutaneous, TID WC, Donnamae Jude, RPH, 2 Units at 03/09/22 1242   leptospermum manuka honey (MEDIHONEY) paste 1 application , 1 application , Topical, Daily, Mariel Aloe, MD, 1 application  at 64/40/34 0854   levothyroxine (SYNTHROID) tablet 25 mcg, 25 mcg, Oral, Daily, Shela Leff, MD, 25 mcg at 03/09/22 0651   lip balm (CARMEX) ointment, , Topical, PRN, Mariel Aloe, MD   magnesium oxide (MAG-OX) tablet 400 mg, 400 mg, Oral, BID, Elodia Florence., MD, 400 mg at 03/09/22 0844   metoprolol tartrate (LOPRESSOR) tablet 12.5 mg, 12.5 mg, Oral, BID, Shela Leff, MD, 12.5 mg at 03/09/22 0844   ondansetron (ZOFRAN-ODT) disintegrating tablet 4 mg, 4 mg, Oral, Q8H PRN, Shela Leff, MD, 4 mg at 02/25/22 2348   tamsulosin (FLOMAX) capsule 0.4 mg, 0.4 mg, Oral, Daily, Shela Leff, MD, 0.4 mg at 03/09/22 0844   Patients Current Diet:  Diet Order                  Diet regular Room service appropriate? Yes; Fluid consistency: Thin  Diet effective now                         Precautions / Restrictions Precautions Precautions: Fall Precaution Comments: NG tube Restrictions Weight Bearing Restrictions: No    Has the patient had 2 or more falls or a fall with injury in the past year? Yes   Prior Activity Level Limited Community (1-2x/wk): mod I prior to admission with SPC, assist for iADLs, not driving   Prior Functional Level Self Care: Did the patient need help bathing, dressing, using the toilet or eating? Needed some help   Indoor Mobility: Did  the patient need assistance with walking from room to room (with or without device)? Independent   Stairs: Did the patient need assistance with internal or external stairs (with or without device)? Independent   Functional Cognition: Did the patient need help planning regular tasks such as shopping or remembering to take medications? Needed some help   Patient Information Are you of Hispanic, Latino/a,or Spanish origin?: A. No, not of Hispanic, Latino/a, or Spanish origin What is  your race?: A. White Do you need or want an interpreter to communicate with a doctor or health care staff?: 0. No   Patient's Response To:  Health Literacy and Transportation Is the patient able to respond to health literacy and transportation needs?: Yes Health Literacy - How often do you need to have someone help you when you read instructions, pamphlets, or other written material from your doctor or pharmacy?: Never In the past 12 months, has lack of transportation kept you from medical appointments or from getting medications?: No In the past 12 months, has lack of transportation kept you from meetings, work, or from getting things needed for daily living?: No   Home Assistive Devices / [place] Devices/Equipment: Radio producer (specify quad or straight) Home Equipment: Rollator (4 wheels), Cane - quad   Prior Device Use: Indicate devices/aids used by the patient prior to current illness, exacerbation or injury?  cane   Current Functional Level Cognition   Arousal/Alertness: Awake/alert Overall Cognitive Status: No family/caregiver present to determine baseline cognitive functioning Orientation Level: Oriented X4 Following Commands: Follows multi-step commands with increased time Safety/Judgement: Decreased awareness of deficits, Decreased awareness of safety General Comments: HOH Attention: Sustained Sustained Attention: Appears intact Memory:  (TBA) Awareness:  (intact for dysarthria) Problem  Solving: Appears intact (basic) Safety/Judgment: Appears intact (verbal info)    Extremity Assessment (includes Sensation/Coordination)   Upper Extremity Assessment: Generalized weakness  Lower Extremity Assessment: Defer to PT evaluation RLE Deficits / Details: grossly 3/5, increased time to achieve test position, losing balance in sitting with movement of LE RLE Sensation:  (pt unable to clearly answer) RLE Coordination: decreased fine motor, decreased gross motor LLE Deficits / Details: grossly 4-/5, losing balance with LE movements. LLE Coordination: decreased fine motor, decreased gross motor     ADLs   Overall ADL's : Needs assistance/impaired Eating/Feeding: Set up, Sitting Grooming: Wash/dry hands, Wash/dry face, Set up, Sitting Grooming Details (indicate cue type and reason): at sink Upper Body Bathing: Minimal assistance, Sitting Lower Body Bathing: Maximal assistance, Sitting/lateral leans Upper Body Dressing : Minimal assistance, Sitting Upper Body Dressing Details (indicate cue type and reason): to donn gown Lower Body Dressing: Moderate assistance, Sit to/from stand Toilet Transfer: Min guard, Rolling walker (2 wheels), Regular Glass blower/designer Details (indicate cue type and reason): transfer training to [place] and Hygiene: Maximal assistance, Total assistance Toileting - Clothing Manipulation Details (indicate cue type and reason): total A in standing to complete peri-care after patient found sitting in soiled linen Functional mobility during ADLs: Minimal assistance, Moderate assistance, Cueing for safety, Cueing for sequencing General ADL Comments: difficulty with sit to stands     Mobility   Overal bed mobility: Needs Assistance Bed Mobility: Supine to Sit Supine to sit: Min assist, HOB elevated Sit to supine: Mod assist General bed mobility comments: Seated in recliner cues for hand placement to scoot forward and backward into  recliner.     Transfers   Overall transfer level: Needs assistance Equipment used: Rolling walker (2 wheels) Transfers: Sit to/from Stand, Bed to chair/wheelchair/BSC Sit to Stand: Min guard, Min assist Bed to/from chair/wheelchair/BSC transfer type:: Step pivot Stand pivot transfers: Min guard Step pivot transfers: Mod assist General transfer comment: MOd cues for hand placement and forward weight shifting to and from seated surface.     Ambulation / Gait / Stairs / Wheelchair Mobility   Ambulation/Gait Ambulation/Gait assistance: Counsellor (Feet): 100 Feet Assistive device: Rolling walker (2 wheels) Gait  Pattern/deviations: Decreased step length - right, Decreased step length - left, Shuffle, Leaning posteriorly, Narrow base of support General Gait Details: Cues for posture awareness and safe body position in RW when turning and backing.  Cues to increase B stride length as fatigue sets in. Gait velocity: slowed Gait velocity interpretation: <1.31 ft/sec, indicative of household ambulator Pre-gait activities: standing marches, minimal clearance, strong posterior lean     Posture / Balance Dynamic Sitting Balance Sitting balance - Comments: able to sit on EOB without assistance Balance Overall balance assessment: Needs assistance Sitting-balance support: No upper extremity supported, Feet supported Sitting balance-Leahy Scale: Good Sitting balance - Comments: able to sit on EOB without assistance Postural control: Posterior lean Standing balance support: Reliant on assistive device for balance Standing balance-Leahy Scale: Poor Standing balance comment: reliance on BUE support     Special needs/care consideration Skin DTPI unstageable to coccyx and Diabetic management yes    Previous Home Environment (from acute therapy documentation) Living Arrangements: Children Available Help at Discharge: Family, Available 24 hours/day Type of Home: House Home Layout: Two  level Alternate Level Stairs-Rails: Right, Left Alternate Level Stairs-Number of Steps: 13 Home Access: Stairs to enter Technical brewer of Steps: 3 Bathroom Shower/Tub: Multimedia programmer: Standard Bathroom Accessibility: Yes How Accessible: Accessible via walker Home Care Services: Yes Type of Home Care Services: [place] (if known):  Warehouse manager)   Discharge Living Setting Plans for Discharge Living Setting: Lives with (comment) (son [name]) and daughter in Sports coach) Type of Home at Discharge: House Discharge Home Layout: Two level, Bed/bath upstairs Alternate Level Stairs-Rails: Right, Left Alternate Level Stairs-Number of Steps: 13 Discharge Home Access: Stairs to enter Entrance Stairs-Rails: None Entrance Stairs-Number of Steps: 3 Discharge Bathroom Shower/Tub: Walk-in shower Discharge Bathroom Toilet: Standard Discharge Bathroom Accessibility: Yes How Accessible: Accessible via walker Does the patient have any problems obtaining your medications?: No   Social/Family/Support Systems Anticipated Caregiver: son [name] Anticipated Ambulance person Information: [phone] Ability/Limitations of Caregiver: n/a, both [name] and his wife work from home Caregiver Availability: 24/7 Discharge Plan Discussed with Primary Caregiver: Yes Is Caregiver In Agreement with Plan?: Yes Does Caregiver/Family have Issues with Lodging/Transportation while Pt is in Rehab?: No   Goals Patient/Family Goal for Rehab: PT/OT/SLP supervision Expected length of stay: 9-12 days Pt/Family Agrees to Admission and willing to participate: Yes Program Orientation Provided & Reviewed with Pt/Caregiver Including Roles  & Responsibilities: Yes  Barriers to Discharge: Insurance for SNF coverage   Decrease burden of Care through IP rehab admission: na   Possible need for SNF placement upon discharge: Not anticipated.    Patient Condition: I have reviewed  medical records from [facility], spoken with  [facility] team , and patient and son. I met with patient at the bedside and discussed via phone for inpatient rehabilitation assessment.  Patient will benefit from ongoing PT, OT, and SLP, can actively participate in 3 hours of therapy a day 5 days of the week, and can make measurable gains during the admission.  Patient will also benefit from the coordinated team approach during an Inpatient Acute Rehabilitation admission.  The patient will receive intensive therapy as well as Rehabilitation physician, nursing, social worker, and care management interventions.  Due to bladder management, safety, skin/wound care, disease management, medication administration, and patient education the patient requires 24 hour a day rehabilitation nursing.  The patient is currently min assist to min guard with mobility and basic ADLs.  Discharge setting and therapy post  discharge at home with home health is anticipated.  Patient has agreed to participate in the Acute Inpatient Rehabilitation Program and will admit today.   Preadmission Screen Completed By:  Michel Santee, PT, DPT 03/09/2022 1:00 PM ______________________________________________________________________   Discussed status with Dr. Curlene Dolphin on 03/09/22  at 1:00 PM  and received approval for admission today.   Admission Coordinator:  Michel Santee, PT, DPT time 1:00 PM Sudie Grumbling 03/09/22     Assessment/Plan: Diagnosis:acute punctate infarct within the right cerebellar hemisphere, possible right frontal lobe infarct likely cardioembolic in the setting of paroxysmal atrial fibrillation Does the need for close, 24 hr/day Medical supervision in concert with the patient's rehab needs make it unreasonable for this patient to be served in a less intensive setting? Yes Co-Morbidities requiring supervision/potential complications: paroxysmal A-fib not on anticoagulation, hypertension, hypothyroidism, Parkinson's disease,  lumbar spondylosis, type 2 diabetes, hyperlipidemia, PVD, CKD stage IIIb, remote history of colon cancer, BPH, pancreatic insufficiency, gallstones, GERD Due to bladder management, bowel management, safety, skin/wound care, disease management, medication administration, pain management, and patient education, does the patient require 24 hr/day rehab nursing? Yes Does the patient require coordinated care of a physician, rehab nurse, PT, OT, and SLP to address physical and functional deficits in the context of the above medical diagnosis(es)? Yes Addressing deficits in the following areas: balance, endurance, locomotion, strength, transferring, bowel/bladder control, bathing, dressing, feeding, grooming, toileting, cognition, speech, language, swallowing, and psychosocial support Can the patient actively participate in an intensive therapy program of at least 3 hrs of therapy 5 days a week? Yes The potential for patient to make measurable gains while on inpatient rehab is good Anticipated functional outcomes upon discharge from inpatient rehab: supervision PT, supervision OT, supervision SLP Estimated rehab length of stay to reach the above functional goals is: 9-12 days Anticipated discharge destination: Home 10. Overall Rehab/Functional Prognosis: good     MD Signature: Jennye Boroughs

## 2022-03-09 NOTE — Progress Notes (Incomplete)
Inpatient Rehabilitation Admission Medication Review by a Pharmacist  A complete drug regimen review was completed for this patient to identify any potential clinically significant medication issues.  High Risk Drug Classes Is patient taking? Indication by Medication  Antipsychotic Yes Compazine- N/V  Anticoagulant Yes Apixaban- AF  Antibiotic No   Opioid No   Antiplatelet Yes Aspirin- CVA prophylaxis  Hypoglycemics/insulin Yes Insulin- T2DM  Vasoactive Medication Yes Lopressor- rate control Flomax- BPH  Chemotherapy No   Other Yes Synthroid- hypothyroidism Sinemet- Parkinson's Disease     Type of Medication Issue Identified Description of Issue Recommendation(s)  Drug Interaction(s) (clinically significant)     Duplicate Therapy     Allergy     No Medication Administration End Date     Incorrect Dose     Additional Drug Therapy Needed     Significant med changes from prior encounter (inform family/care partners about these prior to discharge).    Other  PTA meds: Lasix 20 mg daily Glucotrol 5 mg acb Microzide 12.5 mg daily Restart PTA meds when and if necessary during CIR admission or at time of discharge, if warranted     Clinically significant medication issues were identified that warrant physician communication and completion of prescribed/recommended actions by midnight of the next day:  No   Time spent performing this drug regimen review (minutes):  30   Mirakle Tomlin BS, PharmD, BCPS Clinical Pharmacist 03/09/2022 2:54 PM  Contact: [phone] after 3 PM  "Be curious, not judgmental..." -Jamal Maes

## 2022-03-09 NOTE — Progress Notes (Signed)
Mobility Specialist Progress Note    03/09/22 1121  Mobility  Activity Ambulated with assistance in hallway  Level of Assistance Minimal assist, patient does 75% or more  Assistive Device Front wheel walker  Distance Ambulated (ft) 260 ft  Activity Response Tolerated well  $Mobility charge 1 Mobility   Pt received in bed and agreeable. No complaints on walk. Returned to bed with call bell in reach.    Hildred Alamin Mobility Specialist

## 2022-03-10 DIAGNOSIS — I639 Cerebral infarction, unspecified: Principal | ICD-10-CM | POA: Diagnosis present

## 2022-03-10 DIAGNOSIS — I63419 Cerebral infarction due to embolism of unspecified middle cerebral artery: Secondary | ICD-10-CM

## 2022-03-10 LAB — VITAMIN D 25 HYDROXY (VIT D DEFICIENCY, FRACTURES): Vit D, 25-Hydroxy: 59.59 ng/mL (ref 30–100)

## 2022-03-10 LAB — COMPREHENSIVE METABOLIC PANEL
ALT: 5 U/L (ref 0–44)
AST: 18 U/L (ref 15–41)
Albumin: 1.8 g/dL — ABNORMAL LOW (ref 3.5–5.0)
Alkaline Phosphatase: 63 U/L (ref 38–126)
Anion gap: 5 (ref 5–15)
BUN: 30 mg/dL — ABNORMAL HIGH (ref 8–23)
CO2: 23 mmol/L (ref 22–32)
Calcium: 7.9 mg/dL — ABNORMAL LOW (ref 8.9–10.3)
Chloride: 111 mmol/L (ref 98–111)
Creatinine, Ser: 1.74 mg/dL — ABNORMAL HIGH (ref 0.61–1.24)
GFR, Estimated: 36 mL/min — ABNORMAL LOW (ref >60)
Glucose, Bld: 136 mg/dL — ABNORMAL HIGH (ref 70–99)
Potassium: 4.4 mmol/L (ref 3.5–5.1)
Sodium: 139 mmol/L (ref 135–145)
Total Bilirubin: 0.8 mg/dL (ref 0.3–1.2)
Total Protein: 5.7 g/dL — ABNORMAL LOW (ref 6.5–8.1)

## 2022-03-10 LAB — CBC WITH DIFFERENTIAL/PLATELET
Abs Immature Granulocytes: 0.02 10*3/uL (ref 0.00–0.07)
Basophils Absolute: 0 10*3/uL (ref 0.0–0.1)
Basophils Relative: 1 %
Eosinophils Absolute: 0.2 10*3/uL (ref 0.0–0.5)
Eosinophils Relative: 3 %
HCT: 27.8 % — ABNORMAL LOW (ref 39.0–52.0)
Hemoglobin: 9.2 g/dL — ABNORMAL LOW (ref 13.0–17.0)
Immature Granulocytes: 0 %
Lymphocytes Relative: 11 %
Lymphs Abs: 0.7 10*3/uL (ref 0.7–4.0)
MCH: 32.9 pg (ref 26.0–34.0)
MCHC: 33.1 g/dL (ref 30.0–36.0)
MCV: 99.3 fL (ref 80.0–100.0)
Monocytes Absolute: 0.5 10*3/uL (ref 0.1–1.0)
Monocytes Relative: 9 %
Neutro Abs: 4.6 10*3/uL (ref 1.7–7.7)
Neutrophils Relative %: 76 %
Platelets: 249 10*3/uL (ref 150–400)
RBC: 2.8 MIL/uL — ABNORMAL LOW (ref 4.22–5.81)
RDW: 14.8 % (ref 11.5–15.5)
WBC: 6.1 10*3/uL (ref 4.0–10.5)
nRBC: 0 % (ref 0.0–0.2)

## 2022-03-10 LAB — GLUCOSE, CAPILLARY
Glucose-Capillary: 185 mg/dL — ABNORMAL HIGH (ref 70–99)
Glucose-Capillary: 115 mg/dL — ABNORMAL HIGH (ref 70–99)
Glucose-Capillary: 175 mg/dL — ABNORMAL HIGH (ref 70–99)

## 2022-03-10 MED ORDER — ACETAMINOPHEN 325 MG PO TABS
650.0000 mg | ORAL_TABLET | Freq: Three times a day (TID) | ORAL | Status: DC
  Administered 2022-03-10 – 2022-03-13 (×9): 650 mg via ORAL
  Filled 2022-03-10 (×9): qty 2

## 2022-03-10 MED ORDER — BISACODYL 5 MG PO TBEC
5.0000 mg | DELAYED_RELEASE_TABLET | Freq: Every day | ORAL | Status: DC | PRN
  Administered 2022-03-10: 5 mg via ORAL
  Filled 2022-03-10: qty 1

## 2022-03-10 MED ORDER — MAGNESIUM HYDROXIDE 400 MG/5ML PO SUSP: 30.0000 mL | Freq: Every day | ORAL | Status: DC | PRN

## 2022-03-10 MED ORDER — FUROSEMIDE 20 MG PO TABS
10.0000 mg | ORAL_TABLET | Freq: Every day | ORAL | Status: DC
Start: 2022-03-10 — End: 2022-03-13
  Administered 2022-03-10 – 2022-03-13 (×4): 10 mg via ORAL
  Filled 2022-03-10 (×4): qty 1

## 2022-03-10 MED ORDER — PROCHLORPERAZINE EDISYLATE 10 MG/2ML IJ SOLN: 5.0000 mg | Freq: Four times a day (QID) | INTRAMUSCULAR | Status: DC | PRN

## 2022-03-10 MED ORDER — INSULIN ASPART 100 UNIT/ML IJ SOLN: 0.0000 [IU] | Freq: Every day | INTRAMUSCULAR | Status: DC

## 2022-03-10 MED ORDER — TAMSULOSIN HCL 0.4 MG PO CAPS
0.4000 mg | ORAL_CAPSULE | Freq: Every day | ORAL | Status: DC
  Administered 2022-03-10 – 2022-03-15 (×6): 0.4 mg via ORAL
  Filled 2022-03-10 (×6): qty 1

## 2022-03-10 MED ORDER — ENSURE ENLIVE PO LIQD
237.0000 mL | Freq: Three times a day (TID) | ORAL | Status: DC
  Administered 2022-03-10 – 2022-03-16 (×15): 237 mL via ORAL

## 2022-03-10 MED ORDER — ACETAMINOPHEN 325 MG PO TABS: 325.0000 mg | ORAL_TABLET | ORAL | Status: DC | PRN

## 2022-03-10 MED ORDER — CARBIDOPA-LEVODOPA ER 25-100 MG PO TBCR
1.0000 | EXTENDED_RELEASE_TABLET | Freq: Every evening | ORAL | Status: DC
  Administered 2022-03-10 – 2022-03-21 (×12): 1 via ORAL
  Filled 2022-03-10 (×13): qty 1

## 2022-03-10 MED ORDER — TRAZODONE HCL 50 MG PO TABS: 25.0000 mg | ORAL_TABLET | Freq: Every evening | ORAL | Status: DC | PRN

## 2022-03-10 MED ORDER — ALUM & MAG HYDROXIDE-SIMETH 200-200-20 MG/5ML PO SUSP: 30.0000 mL | ORAL | Status: DC | PRN

## 2022-03-10 MED ORDER — MAGNESIUM OXIDE -MG SUPPLEMENT 400 (240 MG) MG PO TABS
400.0000 mg | ORAL_TABLET | Freq: Two times a day (BID) | ORAL | Status: DC
  Administered 2022-03-10 – 2022-03-22 (×25): 400 mg via ORAL
  Filled 2022-03-10 (×25): qty 1

## 2022-03-10 MED ORDER — METOPROLOL TARTRATE 12.5 MG HALF TABLET
12.5000 mg | ORAL_TABLET | Freq: Two times a day (BID) | ORAL | Status: DC
  Administered 2022-03-10 – 2022-03-22 (×23): 12.5 mg via ORAL
  Filled 2022-03-10 (×25): qty 1

## 2022-03-10 MED ORDER — PROCHLORPERAZINE MALEATE 5 MG PO TABS: 5.0000 mg | ORAL_TABLET | Freq: Four times a day (QID) | ORAL | Status: DC | PRN

## 2022-03-10 MED ORDER — INSULIN ASPART 100 UNIT/ML IJ SOLN
0.0000 [IU] | Freq: Three times a day (TID) | INTRAMUSCULAR | Status: DC
  Administered 2022-03-10: 3 [IU] via SUBCUTANEOUS
  Administered 2022-03-11 – 2022-03-13 (×6): 2 [IU] via SUBCUTANEOUS
  Administered 2022-03-14: 5 [IU] via SUBCUTANEOUS
  Administered 2022-03-15: 2 [IU] via SUBCUTANEOUS
  Administered 2022-03-15 (×2): 3 [IU] via SUBCUTANEOUS
  Administered 2022-03-16: 2 [IU] via SUBCUTANEOUS
  Administered 2022-03-16 – 2022-03-17 (×2): 3 [IU] via SUBCUTANEOUS
  Administered 2022-03-17: 2 [IU] via SUBCUTANEOUS
  Administered 2022-03-18: 5 [IU] via SUBCUTANEOUS
  Administered 2022-03-19 – 2022-03-20 (×4): 2 [IU] via SUBCUTANEOUS
  Administered 2022-03-20: 3 [IU] via SUBCUTANEOUS
  Administered 2022-03-20: 2 [IU] via SUBCUTANEOUS
  Administered 2022-03-21 (×2): 3 [IU] via SUBCUTANEOUS
  Administered 2022-03-22: 2 [IU] via SUBCUTANEOUS

## 2022-03-10 MED ORDER — ASPIRIN 81 MG PO TBEC
81.0000 mg | DELAYED_RELEASE_TABLET | Freq: Every day | ORAL | Status: DC
  Administered 2022-03-10 – 2022-03-22 (×13): 81 mg via ORAL
  Filled 2022-03-10 (×13): qty 1

## 2022-03-10 MED ORDER — APIXABAN 2.5 MG PO TABS
2.5000 mg | ORAL_TABLET | Freq: Two times a day (BID) | ORAL | Status: DC
  Administered 2022-03-10 – 2022-03-22 (×25): 2.5 mg via ORAL
  Filled 2022-03-10 (×25): qty 1

## 2022-03-10 MED ORDER — GUAIFENESIN-DM 100-10 MG/5ML PO SYRP
5.0000 mL | ORAL_SOLUTION | Freq: Four times a day (QID) | ORAL | Status: DC | PRN
  Administered 2022-03-10: 10 mL via ORAL
  Filled 2022-03-10: qty 10

## 2022-03-10 MED ORDER — CARBIDOPA-LEVODOPA ER 25-100 MG PO TBCR
1.0000 | EXTENDED_RELEASE_TABLET | Freq: Two times a day (BID) | ORAL | Status: DC
  Administered 2022-03-10 – 2022-03-22 (×26): 1 via ORAL
  Filled 2022-03-10 (×26): qty 1

## 2022-03-10 MED ORDER — ADULT MULTIVITAMIN W/MINERALS CH
1.0000 | ORAL_TABLET | Freq: Every day | ORAL | Status: DC
Start: 2022-03-10 — End: 2022-03-22
  Administered 2022-03-10 – 2022-03-22 (×13): 1 via ORAL
  Filled 2022-03-10 (×13): qty 1

## 2022-03-10 MED ORDER — PANCRELIPASE (LIP-PROT-AMYL) 12000-38000 UNITS PO CPEP
12000.0000 [IU] | ORAL_CAPSULE | Freq: Three times a day (TID) | ORAL | Status: DC
  Filled 2022-03-10 (×2): qty 1

## 2022-03-10 MED ORDER — METHOCARBAMOL 500 MG PO TABS: 500.0000 mg | ORAL_TABLET | Freq: Four times a day (QID) | ORAL | Status: DC | PRN

## 2022-03-10 MED ORDER — LEVOTHYROXINE SODIUM 25 MCG PO TABS
25.0000 ug | ORAL_TABLET | Freq: Every day | ORAL | Status: DC
  Administered 2022-03-10 – 2022-03-22 (×13): 25 ug via ORAL
  Filled 2022-03-10 (×13): qty 1

## 2022-03-10 MED ORDER — PROCHLORPERAZINE 25 MG RE SUPP: 12.5000 mg | Freq: Four times a day (QID) | RECTAL | Status: DC | PRN

## 2022-03-10 MED ORDER — FLEET ENEMA 7-19 GM/118ML RE ENEM: 1.0000 | ENEMA | Freq: Once | RECTAL | Status: DC | PRN

## 2022-03-10 MED ORDER — INSULIN ASPART 100 UNIT/ML IJ SOLN
2.0000 [IU] | Freq: Three times a day (TID) | INTRAMUSCULAR | Status: DC
Start: 2022-03-10 — End: 2022-03-13
  Administered 2022-03-10 – 2022-03-13 (×7): 2 [IU] via SUBCUTANEOUS

## 2022-03-10 NOTE — Progress Notes (Signed)
Inpatient Rehabilitation Center Individual Statement of Services  Patient Name:  Steven Ward  Date:  03/10/2022  Welcome to the Inpatient Rehabilitation Center.  Our goal is to provide you with an individualized program based on your diagnosis and situation, designed to meet your specific needs.  With this comprehensive rehabilitation program, you will be expected to participate in at least 3 hours of rehabilitation therapies Monday-Friday, with modified therapy programming on the weekends.  Your rehabilitation program will include the following services:  Physical Therapy (PT), Occupational Therapy (OT), Speech Therapy (ST), 24 hour per day rehabilitation nursing, Care Coordinator, Rehabilitation Medicine, Nutrition Services, and Pharmacy Services  Weekly team conferences will be held on Wednesday to discuss your progress.  Your Inpatient Rehabilitation Care Coordinator will talk with you frequently to get your input and to update you on team discussions.  Team conferences with you and your family in attendance may also be held.  Expected length of stay: 7-10 days  Overall anticipated outcome: supervision with cues  Depending on your progress and recovery, your program may change. Your Inpatient Rehabilitation Care Coordinator will coordinate services and will keep you informed of any changes. Your Inpatient Rehabilitation Care Coordinator's name and contact numbers are listed  below.  The following services may also be recommended but are not provided by the Inpatient Rehabilitation Center:   Home Health Rehabiltiation Services Outpatient Rehabilitation Services    Arrangements will be made to provide these services after discharge if needed.  Arrangements include referral to agencies that provide these services.  Your insurance has been verified to be:  [employer] Your primary doctor is:  Evelena Peat  Pertinent information will be shared with your doctor and your  insurance company.  Inpatient Rehabilitation Care Coordinator:  Dossie Der, [place] [phone] or Luna Glasgow  Information discussed with and copy given to patient by: Lucy Chris, 03/10/2022, 12:55 PM

## 2022-03-10 NOTE — Plan of Care (Signed)
  Problem: RH Grooming Goal: LTG Patient will perform grooming w/assist,cues/equip (OT) Description: LTG: Patient will perform grooming with assist, with/without cues using equipment (OT) Flowsheets (Taken 03/10/2022 1315) LTG: Pt will perform grooming with assistance level of: Set up assist    Problem: RH Bathing Goal: LTG Patient will bathe all body parts with assist levels (OT) Description: LTG: Patient will bathe all body parts with assist levels (OT) Flowsheets (Taken 03/10/2022 1315) LTG: Pt will perform bathing with assistance level/cueing: Set up assist  LTG: Position pt will perform bathing: Shower   Problem: RH Dressing Goal: LTG Patient will perform lower body dressing w/assist (OT) Description: LTG: Patient will perform lower body dressing with assist, with/without cues in positioning using equipment (OT) Flowsheets (Taken 03/10/2022 1315) LTG: Pt will perform lower body dressing with assistance level of: Supervision/Verbal cueing   Problem: RH Toileting Goal: LTG Patient will perform toileting task (3/3 steps) with assistance level (OT) Description: LTG: Patient will perform toileting task (3/3 steps) with assistance level (OT)  Flowsheets (Taken 03/10/2022 1315) LTG: Pt will perform toileting task (3/3 steps) with assistance level: Supervision/Verbal cueing   Problem: RH Toilet Transfers Goal: LTG Patient will perform toilet transfers w/assist (OT) Description: LTG: Patient will perform toilet transfers with assist, with/without cues using equipment (OT) Flowsheets (Taken 03/10/2022 1315) LTG: Pt will perform toilet transfers with assistance level of: Supervision/Verbal cueing   Problem: RH Tub/Shower Transfers Goal: LTG Patient will perform tub/shower transfers w/assist (OT) Description: LTG: Patient will perform tub/shower transfers with assist, with/without cues using equipment (OT) Flowsheets (Taken 03/10/2022 1315) LTG: Pt will perform tub/shower stall transfers with  assistance level of: Supervision/Verbal cueing

## 2022-03-10 NOTE — Evaluation (Signed)
Physical Therapy Assessment and Plan  Patient Details  Name: Steven Ward MRN: [id number] Date of Birth: 1925/03/04  PT Diagnosis: Abnormal posture, Abnormality of gait, Difficulty walking, and Muscle weakness Rehab Potential: Good ELOS: 7-9 days   Today's Date: 03/10/2022 PT Individual Time: 1300-1359 PT Individual Time Calculation (min): 59 min    Hospital Problem: Principal Problem:   CVA (cerebral vascular accident) [facility]) Active Problems:   Primary hypertension   Parkinson's disease (HCC)   Small bowel obstruction (HCC)   Stage 3b chronic kidney disease (HCC)   Cerebrovascular accident (CVA) due to embolism of right cerebellar artery [facility])   Past Medical History:  Past Medical History:  Diagnosis Date   ABNORMAL THYROID FUNCTION TESTS 01/17/2010   ALLERGIC RHINITIS 12/01/2008   Colon cancer (HCC)    COLONIC POLYPS, HX OF 12/01/2008   DIABETES MELLITUS, TYPE II 12/01/2008   EDEMA LEG 05/04/2009   ESSENTIAL HYPERTENSION 12/01/2008   Exocrine pancreatic insufficiency    GALLSTONES 04/07/2009   GERD 12/01/2008   Hay fever    SPONDYLOSIS, LUMBAR 12/01/2008   Past Surgical History:  Past Surgical History:  Procedure Laterality Date   ANKLE SURGERY Right    CATARACT EXTRACTION     CHOLECYSTECTOMY     COLON SURGERY     resection 1990 for cancer   LEG SURGERY Left     Assessment & Plan Clinical Impression: Patient is a 88 year old male who was brought to [facility] emergency department on 02/22/2022 by his son who noted increased generalized weakness worsening over the past 24 hours. The patient fell at home and was found down, not able to get up. History of atrial fibrillation and Parkinson's disease. EKG showed a. fib and cardiology, Dr. Katrinka Blazing, was consulted. Tele: rate-controlled atrial fibrillation with frequent PVCs. UA and chest x-ray were unremarkable.  His history is remarkable for ablation approximately 20 years previously.  Echocardiogram and stroke work-up recommended  by cardiology.  Also, recommended starting apixaban. Patient's son preferred not to start this due to risk of bleeding and father's advanced age. Serum creatinine 2.4 and given IVF hydration.    Additionally, he had complaints of nausea, vomiting and abdominal distention.  CT of abdomen and pelvis obtained. Surgical consultation obtained for possible SBO. NGT placed. NGT clamped 6/10 and eventually removed and diet advanced.   CT brain negative; MRI shows subacute infarct and right frontal lobe, punctate right cerebellar infarcts.  Continue metoprolol tartrate 12.5 mg twice daily.  Keep potassium level greater than 4 and magnesium level greater than 2.  2D echo normal.  Cardiology signed off 6/8.  Neurology, Dr. Amada Jupiter, consulted on 6/9.  Small punctate stroke very likely related to atrial fibrillation and okay to start anticoagulation.  TTE with normal LVEF of 55 to 60% with no evidence of atrial level shunt.  Started on aspirin 81 mg and Eliquis 2.5 mg twice daily added on 6/15.  Treated for possible right upper lobe pneumonia with ceftriaxone and Flagyl. He received supplementation for low potassium and low magnesium.    His past medical history is also significant for hypertension, hypothyroidism, Parkinson's disease, lumbar spondylosis diabetes mellitus type 2, hyperlipidemia, peripheral vascular disease, chronic kidney disease stage IIIb, remote history of colon cancer with colon resection, benign prostatic [name], pancreatic insufficiency, gastroesophageal reflux disease.  He is seen by Dr. Sander Radon at [facility] primary care who recently treated him for episode of shingles on the face and follow-up diabetes.  He was taken off his glyburide secondary to  chronic kidney disease and recently started on glipizide initially 2.5 mg increased to 5 mg daily 1 month later.  His recent A1c was 7.7   The patient lives at home with his son, Steven Ward. Says he moves around house without assistance and uses cane  outside of the home.Patient transferred to CIR on 03/09/2022 .   Patient currently requires min with mobility secondary to muscle weakness, decreased cardiorespiratoy endurance, and decreased standing balance and decreased balance strategies.  Prior to hospitalization, patient was modified independent  with mobility and lived with Son in a House home.  Home access is 3Stairs to enter.  Patient will benefit from skilled PT intervention to maximize safe functional mobility, minimize fall risk, and decrease caregiver burden for planned discharge home with 24 hour supervision.  Anticipate patient will benefit from follow up HH at discharge.  PT - End of Session Activity Tolerance: Tolerates < 10 min activity, no significant change in vital signs Endurance Deficit: Yes Endurance Deficit Description: benefits from seated rest breaks PT Assessment Rehab Potential (ACUTE/IP ONLY): Good PT Barriers to Discharge: Decreased caregiver support;Home environment access/layout;Incontinence;Insurance for SNF coverage;Lack of/limited family support PT Patient demonstrates impairments in the following area(s): Balance;Edema;Behavior;Endurance;Motor;Safety;Sensory;Skin Integrity;Pain;Nutrition PT Transfers Functional Problem(s): Bed Mobility;Bed to Chair;Car PT Locomotion Functional Problem(s): Ambulation;Stairs PT Plan PT Intensity: Minimum of 1-2 x/day ,45 to 90 minutes PT Frequency: 5 out of 7 days PT Duration Estimated Length of Stay: 7-9 days PT Treatment/Interventions: Warden/ranger;Ambulation/gait training;Cognitive remediation/compensation;Community reintegration;Discharge planning;Disease management/prevention;DME/adaptive equipment instruction;Functional electrical stimulation;Functional mobility training;Neuromuscular re-education;Pain management;Patient/family education;Psychosocial support;Skin care/wound management;Splinting/orthotics;Stair training;Therapeutic Activities;Therapeutic  Exercise;UE/LE Strength taining/ROM;UE/LE Coordination activities;Visual/perceptual remediation/compensation;Wheelchair propulsion/positioning PT Transfers Anticipated Outcome(s): Supervision PT Locomotion Anticipated Outcome(s): Supervision PT Recommendation Follow Up Recommendations: Home health PT;24 hour supervision/assistance Patient destination: Home Equipment Recommended: To be determined   PT Evaluation Precautions/Restrictions Precautions Precautions: Fall Precaution Comments: Parkinson's Restrictions Weight Bearing Restrictions: No Pain Interference Pain Interference Pain Effect on Sleep: 3. Frequently Pain Interference with Therapy Activities: 2. Occasionally Pain Interference with Day-to-Day Activities: 2. Occasionally Home Living/Prior Functioning Home Living Living Arrangements: Children;Other relatives Available Help at Discharge: Family;Available 24 hours/day Type of Home: House Home Access: Stairs to enter [employer] of Steps: 3 Entrance Stairs-Rails: None Home Layout: Two level Alternate Level Stairs-Number of Steps: 13 Alternate Level Stairs-Rails: Left Bathroom Shower/Tub: Health visitor: Standard Bathroom Accessibility: Yes  Lives With: Son Prior Function Level of Independence: Requires assistive device for independence;Independent with gait;Independent with basic ADLs;Independent with transfers  Able to Take Stairs?: Yes Driving: No Vocation: Retired Leisure: Hobbies-yes (Comment) (Mostly sedentary, watches a lot of TV in bed) Vision/Perception  Vision - History Ability to See in Adequate Light: 0 Adequate Perception Perception: Within Functional Limits Praxis Praxis: Intact  Cognition Overall Cognitive Status: No family/caregiver present to determine baseline cognitive functioning Arousal/Alertness: Awake/alert Orientation Level: Oriented X4 Year: 2023 Month: June Day of Week: Correct Attention:  Sustained;Focused Focused Attention: Appears intact Sustained Attention: Appears intact Awareness: Appears intact Problem Solving: Appears intact Safety/Judgment: Impaired Sensation Sensation Light Touch: Appears Intact Hot/Cold: Appears Intact Proprioception: Appears Intact Stereognosis: Appears Intact Coordination Gross Motor Movements are Fluid and Coordinated: No Coordination and Movement Description: Mild tremor BUE secondary to Parkinson's at baseline Heel Shin Test: Limited ROM due to weakness but [facility] Motor  Motor Motor - Skilled Clinical Observations: mild tremor BUE and mild decreased step length likely at baseline due to parkinson's diagnosis   Trunk/Postural Assessment  Cervical Assessment Cervical Assessment: Within Functional Limits Thoracic Assessment Thoracic Assessment: Exceptions  to [facility] (rounded shoulders) Lumbar Assessment Lumbar Assessment: Exceptions to [facility] (posterior pelvic tilt) Postural Control Postural Control: Within Functional Limits  Balance Balance Balance Assessed: Yes Static Sitting Balance Static Sitting - Balance Support: No upper extremity supported;Feet supported Static Sitting - Level of Assistance: 6: Modified independent (Device/Increase time) Dynamic Sitting Balance Dynamic Sitting - Balance Support: During functional activity;Feet supported Dynamic Sitting - Level of Assistance: 5: Stand by assistance Static Standing Balance Static Standing - Balance Support: No upper extremity supported;During functional activity Static Standing - Level of Assistance: 5: Stand by assistance Dynamic Standing Balance Dynamic Standing - Balance Support: No upper extremity supported;During functional activity Dynamic Standing - Level of Assistance: 4: Min assist Extremity Assessment      RLE Assessment RLE Assessment: Exceptions to [facility] General Strength Comments: Grossly 4/5 LLE Assessment LLE Assessment: Exceptions to [facility] General Strength  Comments: Grossly 4/5  Care Tool Care Tool Bed Mobility Roll left and right activity   Roll left and right assist level: Independent with assistive device    Sit to lying activity   Sit to lying assist level: Contact Guard/Touching assist    Lying to sitting on side of bed activity   Lying to sitting on side of bed assist level: the ability to move from lying on the back to sitting on the side of the bed with no back support.: Contact Guard/Touching assist     Care Tool Transfers Sit to stand transfer   Sit to stand assist level: Minimal Assistance - Patient > 75%    Chair/bed transfer   Chair/bed transfer assist level: Minimal Assistance - Patient > 75%     Toilet transfer   Assist Level: Minimal Assistance - Patient > 75%    Car transfer   Car transfer assist level: Minimal Assistance - Patient > 75%      Care Tool Locomotion Ambulation   Assist level: Minimal Assistance - Patient > 75% Assistive device: Walker-rolling Max distance: 274ft  Walk 10 feet activity   Assist level: Minimal Assistance - Patient > 75% Assistive device: Walker-rolling   Walk 50 feet with 2 turns activity   Assist level: Minimal Assistance - Patient > 75% Assistive device: Walker-rolling  Walk 150 feet activity   Assist level: Minimal Assistance - Patient > 75% Assistive device: Walker-rolling  Walk 10 feet on uneven surfaces activity   Assist level: Minimal Assistance - Patient > 75% Assistive device: Walker-rolling  Stairs   Assist level: Minimal Assistance - Patient > 75% Stairs assistive device: 2 hand rails Max number of stairs: 8  Walk up/down 1 step activity   Walk up/down 1 step (curb) assist level: Minimal Assistance - Patient > 75% Walk up/down 1 step or curb assistive device: 2 hand rails  Walk up/down 4 steps activity   Walk up/down 4 steps assist level: Minimal Assistance - Patient > 75% Walk up/down 4 steps assistive device: 2 hand rails  Walk up/down 12 steps activity  Walk up/down 12 steps activity did not occur: Safety/medical concerns      Pick up small objects from floor   Pick up small object from the floor assist level: Minimal Assistance - Patient > 75%    Wheelchair Is the patient using a wheelchair?: No          Wheel 50 feet with 2 turns activity      Wheel 150 feet activity        Refer to Care Plan for Long Term Goals  SHORT TERM  GOAL WEEK 1 PT Short Term Goal 1 (Week 1): STG = LTG  Recommendations for other services: None   Skilled Therapeutic Intervention Mobility Bed Mobility Bed Mobility: Supine to Sit Rolling Right: Independent with assistive device Rolling Left: Independent with assistive device Supine to Sit: Contact Guard/Touching assist Transfers Transfers: Sit to Stand;Stand Pivot Transfers;Stand to Sit Sit to Stand: Minimal Assistance - Patient > 75% Stand to Sit: Minimal Assistance - Patient > 75% Stand Pivot Transfers: Minimal Assistance - Patient > 75% Stand Pivot Transfer Details: Manual facilitation for weight shifting;Tactile cues for weight shifting;Verbal cues for safe use of DME/AE;Verbal cues for gait pattern;Verbal cues for technique Transfer (Assistive device): Rolling walker Locomotion  Gait Ambulation: Yes Gait Assistance: Minimal Assistance - Patient > 75% Gait Distance (Feet): 200 Feet Assistive device: Rolling walker Gait Assistance Details: Verbal cues for sequencing;Verbal cues for gait pattern;Verbal cues for safe use of DME/AE;Verbal cues for precautions/safety;Tactile cues for initiation;Tactile cues for posture Gait Gait: Yes Gait Pattern: Impaired Gait Pattern: Step-through pattern;Trunk flexed;Poor foot clearance - right;Poor foot clearance - left;Decreased hip/knee flexion - right;Decreased hip/knee flexion - left;Decreased stride length Stairs / Additional Locomotion Stairs: Yes Stairs Assistance: Minimal Assistance - Patient > 75% Number of Stairs: 8 Height of Stairs:  6 Wheelchair Mobility Wheelchair Mobility: No  Skilled Intervention: Pt resting in bed on arrival and required loud voice to waken. Pt then agreeable to PT evaluation. Initiated functional mobilty as outlined above. Overall, requires CGA for bed mobility, supervision for sitting balance, setupA for UB dressing and min/modA for LB dressing. Sit<>stand to RW with minA and for stand<>pivot transfers. Pt incontinent of bowel during session and continent of further bowel once assisted to toilet. He did require assist for throughness with poseterior pericare. Gait training ~237ft with RW and light minA. Primary gait deficits include forward flexed trunk with downward gaze, decreased stride length, and decreased gait speed. Pt finished session seated in w/c with safety belt alarm on, call bell in lap, all needs met.   Discharge Criteria: Patient will be discharged from PT if patient refuses treatment 3 consecutive times without medical reason, if treatment goals not met, if there is a change in medical status, if patient makes no progress towards goals or if patient is discharged from hospital.  The above assessment, treatment plan, treatment alternatives and goals were discussed and mutually agreed upon: by patient  Kate Larock P Kenji Mapel PT 03/10/2022, 1:48 PM

## 2022-03-10 NOTE — Evaluation (Signed)
Occupational Therapy Assessment and Plan  Patient Details  Name: Steven Ward MRN: [id number] Date of Birth: 05/20/1925  OT Diagnosis: abnormal posture and muscle weakness (generalized), impaired coordination, difficulty walking Rehab Potential: Rehab Potential (ACUTE ONLY): Good ELOS: 7-10 days   Today's Date: 03/10/2022 OT Individual Time: 8403-7543 OT Individual Time Calculation (min): 75 min     Hospital Problem: Principal Problem:   CVA (cerebral vascular accident) ([place]) Active Problems:   Primary hypertension   Parkinson's disease ([place])   Small bowel obstruction ([facility])   Stage 3b chronic kidney disease ([place])   Cerebrovascular accident (CVA) due to embolism of right cerebellar artery [facility])   Past Medical History:  Past Medical History:  Diagnosis Date   ABNORMAL THYROID FUNCTION TESTS 01/17/2010   ALLERGIC RHINITIS 12/01/2008   Colon cancer ([place])    COLONIC POLYPS, HX OF 12/01/2008   DIABETES MELLITUS, TYPE II 12/01/2008   EDEMA LEG 05/04/2009   ESSENTIAL HYPERTENSION 12/01/2008   Exocrine pancreatic insufficiency    GALLSTONES 04/07/2009   GERD 12/01/2008   Hay fever    SPONDYLOSIS, LUMBAR 12/01/2008   Past Surgical History:  Past Surgical History:  Procedure Laterality Date   ANKLE SURGERY Right    CATARACT EXTRACTION     CHOLECYSTECTOMY     COLON SURGERY     resection 1990 for cancer   LEG SURGERY Left     Assessment & Plan Clinical Impression: Steven Ward is a 88 year old male who was brought to [facility] emergency department on 02/22/2022 by his son who noted increased generalized weakness worsening over the past 24 hours. The patient fell at home and was found down, not able to get up. History of atrial fibrillation and Parkinson's disease. EKG showed a. fib and cardiology, Dr. Tamala Julian, was consulted. Tele: rate-controlled atrial fibrillation with frequent PVCs. UA and chest x-ray were unremarkable.  His history is remarkable for ablation approximately 20 years  previously.  Echocardiogram and stroke work-up recommended by cardiology.  Also, recommended starting apixaban. Patient's son preferred not to start this due to risk of bleeding and father's advanced age. Serum creatinine 2.4 and given IVF hydration.    Additionally, he had complaints of nausea, vomiting and abdominal distention.  CT of abdomen and pelvis obtained. Surgical consultation obtained for possible SBO. NGT placed. NGT clamped 6/10 and eventually removed and diet advanced.   CT brain negative; MRI shows subacute infarct and right frontal lobe, punctate right cerebellar infarcts.  Continue metoprolol tartrate 12.5 mg twice daily.  Keep potassium level greater than 4 and magnesium level greater than 2.  2D echo Steven.  Cardiology signed off 6/8.  Neurology, Dr. Leonel Ramsay, consulted on 6/9.  Small punctate stroke very likely related to atrial fibrillation and okay to start anticoagulation.  TTE with Steven LVEF of 55 to 60% with no evidence of atrial level shunt.  Started on aspirin 81 mg and Eliquis 2.5 mg twice daily added on 6/15.  Treated for possible right upper lobe pneumonia with ceftriaxone and Flagyl. He received supplementation for low potassium and low magnesium.      His past medical history is also significant for hypertension, hypothyroidism, Parkinson's disease, lumbar spondylosis diabetes mellitus type 2, hyperlipidemia, peripheral vascular disease, chronic kidney disease stage IIIb, remote history of colon cancer with colon resection, benign prostatic [name], pancreatic insufficiency, gastroesophageal reflux disease.  He is seen by Dr. Jarold Song at [facility] primary care who recently treated him for episode of shingles on the face and follow-up diabetes.  He was taken off his glyburide secondary to chronic kidney disease and recently started on glipizide initially 2.5 mg increased to 5 mg daily 1 month later.  His recent A1c was 7.7   The patient lives at home with his son,  Steven Ward. Says he moves around house without assistance and uses cane outside of the home.  Patient transferred to CIR on 03/09/2022 .    Patient currently requires min with basic self-care skills secondary to muscle weakness, decreased cardiorespiratoy endurance, decreased coordination, and decreased sitting balance, decreased standing balance, decreased postural control, and decreased balance strategies.  Prior to hospitalization, patient could complete BADLs with modified independent .  Patient will benefit from skilled intervention to decrease level of assist with basic self-care skills prior to discharge home with care partner.  Anticipate patient will require 24 hour supervision and  home health .  OT - End of Session Activity Tolerance: Tolerates 30+ min activity with multiple rests Endurance Deficit: Yes Endurance Deficit Description: benefits from seated rest breaks OT Assessment Rehab Potential (ACUTE ONLY): Good OT Patient demonstrates impairments in the following area(s): Balance;Edema;Endurance;Motor;Pain;Safety OT Basic ADL's Functional Problem(s): Grooming;Bathing;Dressing;Toileting OT Transfers Functional Problem(s): Toilet;Tub/Shower OT Additional Impairment(s): Fuctional Use of Upper Extremity OT Plan OT Intensity: Minimum of 1-2 x/day, 45 to 90 minutes OT Frequency: 5 out of 7 days OT Duration/Estimated Length of Stay: 7-10 days OT Treatment/Interventions: Balance/vestibular training;DME/adaptive equipment instruction;Patient/family education;Therapeutic Activities;Cognitive remediation/compensation;Therapeutic Exercise;Psychosocial support;Functional electrical stimulation;Community reintegration;Functional mobility training;Self Care/advanced ADL retraining;UE/LE Strength taining/ROM;Discharge planning;Neuromuscular re-education;Skin care/wound managment;UE/LE Coordination activities;Disease mangement/prevention;Pain management;Visual/perceptual  remediation/compensation;Splinting/orthotics;Wheelchair propulsion/positioning OT Self Feeding Anticipated Outcome(s): mod I OT Basic Self-Care Anticipated Outcome(s): supervision-CGA OT Toileting Anticipated Outcome(s): supervision OT Bathroom Transfers Anticipated Outcome(s): CGA OT Recommendation Patient destination: [place] (SNF) (pt reports he wants to go to a SNF so he is not a burden to his family; Home versus SNF) Follow Up Recommendations: Other (comment) (home health versus SNF versus OP; TBD) Equipment Recommended: To be determined Equipment Details: currently owns a cane and walker   OT Evaluation Precautions/Restrictions  Precautions Precautions: Fall Restrictions Weight Bearing Restrictions: No General Chart Reviewed: Yes Pain Pain Assessment Pain Scale: 0-10 Pain Score: 0-No pain Pain Type: Acute pain Pain Location: Buttocks Pain Orientation: Mid Pain Descriptors / Indicators: Sore Pain Onset: On-going Patients Stated Pain Goal: 0 Pain Intervention(s): Repositioned;RN made aware Multiple Pain Sites: No Home Living/Prior Functioning Home Living Family/patient expects to be discharged to:: Private residence Available Help at Discharge: Family, Available 24 hours/day Type of Home: House Home Access: Stairs to enter Technical brewer of Steps: 3 Entrance Stairs-Rails: None Home Layout: Two level Alternate Level Stairs-Number of Steps: 13 Alternate Level Stairs-Rails: Right, Left Bathroom Shower/Tub: Multimedia programmer: Standard Bathroom Accessibility: Yes  Lives With: Son Prior Function Level of Independence: Requires assistive device for independence, Independent with gait, Independent with basic ADLs, Independent with transfers Driving: No Vocation: Retired Surveyor, mining Baseline Vision/History: 1 Wears glasses (for reading) Ability to See in Adequate Light: 0 Adequate Patient Visual Report: No change from baseline Vision  Assessment?: Yes Eye Alignment: Within Functional Limits Ocular Range of Motion: Within Functional Limits Alignment/Gaze Preference: Within Defined Limits Tracking/Visual Pursuits: Able to track stimulus in all quads without difficulty Saccades: Within functional limits Convergence: Within functional limits Visual Fields: No apparent deficits Perception  Perception: Within Functional Limits Praxis Praxis: Intact Cognition Cognition Overall Cognitive Status: No family/caregiver present to determine baseline cognitive functioning Arousal/Alertness: Awake/alert Orientation Level: Person;Place;Situation Person: Oriented Place: Oriented Situation: Oriented Memory: Appears  intact Attention: Sustained;Focused Focused Attention: Appears intact Sustained Attention: Appears intact Awareness: Appears intact Problem Solving: Appears intact Safety/Judgment: Appears intact Brief Interview for Mental Status (BIMS) Repetition of Three Words (First Attempt): 3 Temporal Orientation: Year: Correct Temporal Orientation: Month: Accurate within 5 days Temporal Orientation: Day: Correct Recall: "Sock": Yes, no cue required Recall: "Blue": Yes, no cue required Recall: "Bed": No, could not recall BIMS Summary Score: 13 Sensation Sensation Light Touch: Appears Intact Hot/Cold: Appears Intact Stereognosis: Appears Intact Coordination Gross Motor Movements are Fluid and Coordinated: No Fine Motor Movements are Fluid and Coordinated: No Coordination and Movement Description: Mild tremor BUE secondary to Parkinson's at baseline Finger Nose Finger Test: WNL Motor  Motor Motor: Other (comment) Motor - Skilled Clinical Observations: mild tremor BUE and mild decreased step length likely at baseline due to parkinson's diagnosis  Trunk/Postural Assessment  Cervical Assessment Cervical Assessment: Within Functional Limits Thoracic Assessment Thoracic Assessment: Exceptions to [facility] (rounded  shoulders) Lumbar Assessment Lumbar Assessment: Exceptions to [facility] (posterior pelvic tilt) Postural Control Postural Control: Within Functional Limits  Balance Balance Balance Assessed: Yes Static Sitting Balance Static Sitting - Balance Support: No upper extremity supported;Feet supported Static Sitting - Level of Assistance: 6: Modified independent (Device/Increase time) Dynamic Sitting Balance Dynamic Sitting - Balance Support: During functional activity;Feet supported Dynamic Sitting - Level of Assistance: 5: Stand by assistance Static Standing Balance Static Standing - Balance Support: No upper extremity supported;During functional activity Static Standing - Level of Assistance: 5: Stand by assistance Dynamic Standing Balance Dynamic Standing - Balance Support: No upper extremity supported;During functional activity Dynamic Standing - Level of Assistance: 4: Min assist Extremity/Trunk Assessment RUE Assessment RUE Assessment: Exceptions to [facility] General Strength Comments: 4-/5 proximally; 3+/5 distally LUE Assessment LUE Assessment: Exceptions to [facility] General Strength Comments: 4-/5 proximally; 3+/5 distally  Care Tool Care Tool Self Care Eating   Eating Assist Level: Set up assist    Oral Care    Oral Care Assist Level: Supervision/Verbal cueing    Bathing         Assist Level: Minimal Assistance - Patient > 75%    Upper Body Dressing(including orthotics)       Assist Level: Supervision/Verbal cueing    Lower Body Dressing (excluding footwear)     Assist for lower body dressing: Minimal Assistance - Patient > 75%    Putting on/Taking off footwear     Assist for footwear: Minimal Assistance - Patient > 75%       Care Tool Toileting Toileting activity   Assist for toileting: Minimal Assistance - Patient > 75%     Care Tool Bed Mobility Roll left and right activity        Sit to lying activity        Lying to sitting on side of bed activity          Care Tool Transfers Sit to stand transfer        Chair/bed transfer         Toilet transfer   Assist Level: Minimal Assistance - Patient > 75%     Care Tool Cognition  Expression of Ideas and Wants Expression of Ideas and Wants: 4. Without difficulty (complex and basic) - expresses complex messages without difficulty and with speech that is clear and easy to understand  Understanding Verbal and Non-Verbal Content Understanding Verbal and Non-Verbal Content: 4. Understands (complex and basic) - clear comprehension without cues or repetitions   Memory/Recall Ability Memory/Recall Ability : That he or she  is in a hospital/hospital unit;Current season;Location of own room   Refer to Care Plan for [place] 1 OT Short Term Goal 1 (Week 1): STGS=LTGs due to ELOS  Recommendations for other services: None    Skilled Therapeutic Intervention ADL ADL Eating: Modified independent Where Assessed-Eating: Wheelchair Grooming: Supervision/safety Where Assessed-Grooming: Standing at sink Upper Body Bathing: Not assessed Lower Body Bathing: Not assessed Upper Body Dressing: Not assessed Lower Body Dressing: Not assessed Toileting: Minimal assistance Where Assessed-Toileting: Glass blower/designer: Psychiatric nurse Method: Arts development officer: Energy manager: Not assessed Social research officer, government: Not assessed Mobility  Bed Mobility Bed Mobility: Supine to Sit Rolling Right: Independent with assistive device Rolling Left: Independent with assistive device Supine to Sit: Supervision/Verbal cueing Transfers Sit to Stand: Minimal Assistance - Patient > 75% Stand to Sit: Minimal Assistance - Patient > 75%   Discharge Criteria: Patient will be discharged from OT if patient refuses treatment 3 consecutive times without medical reason, if treatment goals not met, if there is a change in medical status, if  patient makes no progress towards goals or if patient is discharged from hospital.  The above assessment, treatment plan, treatment alternatives and goals were discussed and mutually agreed upon: by patient  Ezekiel Slocumb 03/10/2022, 1:23 PM

## 2022-03-10 NOTE — Progress Notes (Addendum)
Initial Nutrition Assessment  DOCUMENTATION CODES:   Severe malnutrition in context of chronic illness  INTERVENTION:   Continue regular diet.  Ensure Enlive po TID, each supplement provides 350 kcal and 20 grams of protein.  MVI with minerals daily.  Encourage PO intake of meals and supplements.   NUTRITION DIAGNOSIS:   Severe Malnutrition related to chronic illness (exocrine pancreatic insufficiency) as evidenced by severe muscle depletion, severe fat depletion.  GOAL:   Patient will meet greater than or equal to 90% of their needs  MONITOR:   PO intake, Supplement acceptance, Skin  REASON FOR ASSESSMENT:   Malnutrition Screening Tool    ASSESSMENT:   88 yo male admitted to rehab with functional deficits r/t acute ischemic stroke. PMH includes DM-2, HTN, GERD, lumbar spondylosis, colon cancer, exocrine pancreatic insufficiency.  During acute hospital admission, patient required TPN for partial SBO.   RN in room with patient during RD visit. Patient reports that he has lost weight because he wasn't eating because he didn't feel like eating.    RN reports that he ate almost all of his breakfast today. He had a late breakfast today, so he only at the fruit and diet soda at lunch.  He also drank a chocolate Ensure supplement.   Weight history reviewed.  Usual weight 80.3 kg Feb 2023, down to 71.3 kg May 2023. 11% weight loss within 3 months is severe. Currently 78.8 kg Suspect actual weight loss is being masked by edema.   Labs reviewed.  CBG: 185 today  Medications reviewed and include Lasix, Novolog, Creon, Mag-ox, Flomax.  Patient meets criteria for severe malnutrition, given severe depletion of muscle and subcutaneous fat mass.  NUTRITION - FOCUSED PHYSICAL EXAM:  Flowsheet Row Most Recent Value  Orbital Region Severe depletion  Upper Arm Region Moderate depletion  Thoracic and Lumbar Region Moderate depletion  Buccal Region Severe depletion  Temple  Region Severe depletion  Clavicle Bone Region Severe depletion  Clavicle and Acromion Bone Region Severe depletion  Scapular Bone Region Severe depletion  Dorsal Hand Moderate depletion  Patellar Region Moderate depletion  Anterior Thigh Region Moderate depletion  Posterior Calf Region Moderate depletion  Edema (RD Assessment) Moderate  Hair Reviewed  Eyes Reviewed  Mouth Reviewed  Skin Reviewed  Nails Reviewed       Diet Order:   Diet Order             Diet Carb Modified Fluid consistency: Thin; Room service appropriate? Yes  Diet effective now                   EDUCATION NEEDS:   No education needs have been identified at this time  Skin:  Skin Integrity Issues:: Unstageable Unstageable: coccyx  Last BM:  6/23 type 5  Height:   Ht Readings from Last 1 Encounters:  03/09/22 5\' 8"  (1.727 m)    Weight:   Wt Readings from Last 1 Encounters:  03/09/22 78.8 kg    Ideal Body Weight:  70 kg  BMI:  Body mass index is 26.41 kg/m.  Estimated Nutritional Needs:   Kcal:  1900-2100  Protein:  90-105 gm  Fluid:  >/= 1.9 L    Gabriel Rainwater RD, LDN, CNSC Please refer to Amion for contact information.

## 2022-03-11 DIAGNOSIS — I63441 Cerebral infarction due to embolism of right cerebellar artery: Secondary | ICD-10-CM

## 2022-03-11 DIAGNOSIS — E43 Unspecified severe protein-calorie malnutrition: Secondary | ICD-10-CM | POA: Insufficient documentation

## 2022-03-11 LAB — GLUCOSE, CAPILLARY
Glucose-Capillary: 127 mg/dL — ABNORMAL HIGH (ref 70–99)
Glucose-Capillary: 78 mg/dL (ref 70–99)
Glucose-Capillary: 153 mg/dL — ABNORMAL HIGH (ref 70–99)
Glucose-Capillary: 150 mg/dL — ABNORMAL HIGH (ref 70–99)
Glucose-Capillary: 171 mg/dL — ABNORMAL HIGH (ref 70–99)

## 2022-03-12 LAB — GLUCOSE, CAPILLARY
Glucose-Capillary: 122 mg/dL — ABNORMAL HIGH (ref 70–99)
Glucose-Capillary: 74 mg/dL (ref 70–99)
Glucose-Capillary: 132 mg/dL — ABNORMAL HIGH (ref 70–99)
Glucose-Capillary: 154 mg/dL — ABNORMAL HIGH (ref 70–99)

## 2022-03-13 LAB — GLUCOSE, CAPILLARY
Glucose-Capillary: 139 mg/dL — ABNORMAL HIGH (ref 70–99)
Glucose-Capillary: 122 mg/dL — ABNORMAL HIGH (ref 70–99)
Glucose-Capillary: 57 mg/dL — ABNORMAL LOW (ref 70–99)
Glucose-Capillary: 52 mg/dL — ABNORMAL LOW (ref 70–99)
Glucose-Capillary: 59 mg/dL — ABNORMAL LOW (ref 70–99)
Glucose-Capillary: 71 mg/dL (ref 70–99)
Glucose-Capillary: 169 mg/dL — ABNORMAL HIGH (ref 70–99)

## 2022-03-13 MED ORDER — ACETAMINOPHEN 500 MG PO TABS
1000.0000 mg | ORAL_TABLET | Freq: Three times a day (TID) | ORAL | Status: DC
  Administered 2022-03-13 – 2022-03-22 (×27): 1000 mg via ORAL
  Filled 2022-03-13 (×27): qty 2

## 2022-03-13 MED ORDER — FUROSEMIDE 20 MG PO TABS
20.0000 mg | ORAL_TABLET | Freq: Once | ORAL | Status: AC
  Administered 2022-03-13: 20 mg via ORAL
  Filled 2022-03-13: qty 1

## 2022-03-13 MED ORDER — FUROSEMIDE 20 MG PO TABS
20.0000 mg | ORAL_TABLET | Freq: Every day | ORAL | Status: DC
  Administered 2022-03-14: 20 mg via ORAL
  Filled 2022-03-13: qty 1

## 2022-03-13 MED ORDER — GLUCOSE 4 G PO CHEW
CHEWABLE_TABLET | ORAL | Status: AC
  Administered 2022-03-13: 4 g
  Filled 2022-03-13: qty 1

## 2022-03-13 NOTE — Discharge Summary (Signed)
Physician Discharge Summary  Patient ID: Steven Ward MRN: [id number] DOB/AGE: 1925-04-27 88 y.o.  Admit date: 03/09/2022 Discharge date: 03/22/2022  Discharge Diagnoses:  Principal Problem:   CVA (cerebral vascular accident) [facility]) Active Problems:   Primary hypertension   Parkinson's disease ([place])   Small bowel obstruction ([place])   Stage 3b chronic kidney disease ([place])   Cerebrovascular accident (CVA) due to embolism of right cerebellar artery (HCC)   Protein-calorie malnutrition, severe Functional deficits secondary to acute right cerebellar hemisphere punctate ischemic stroke Paroxysmal atrial fibrillation Diabetes mellitus type 2 Pulmonary plaques/left lung lobe mass Benign prostatic hypertrophy Hypothyroidism Anemia Depressed mood  Discharged Condition: stable   Labs:  Basic Metabolic Panel: Recent Labs  Lab 03/16/22 0527 03/20/22 0642 03/22/22 0531  NA 143 144 139  K 3.8 4.0 3.7  CL 111 109 105  CO2 _0 GLUCOSE 167* 154* 148*  BUN 43* 51* 48*  CREATININE 1.85* 1.94* 1.83*  CALCIUM 8.4* 8.7* 8.3*    CBC: Recent Labs  Lab 03/20/22 0642  WBC 6.6  HGB 8.7*  HCT 27.5*  MCV 100.7*  PLT 281    CBG: Recent Labs  Lab 03/21/22 1133 03/21/22 1618 03/21/22 2056 03/22/22 0630 03/22/22 1207  GLUCAP 119* 167* 138* 122* 127*    Brief HPI:   Steven Ward is a 88 y.o. male who was found down at home by family and brought to [facility] emergency department on 02/22/2022.  His past history is remarkable for Parkinson's disease and atrial fibrillation.  He had noted increased generalized weakness.  Stroke work-up included MRI which showed subacute infarct in right frontal lobe, punctate right cerebellar infarcts.  Also underwent echocardiogram with normal LVEF of 55 to 60%.  Started on aspirin 81 mg and Eliquis 2.5 mg twice daily.  Treated for possible right upper lobe pneumonia with antibiotics.  Developed abdominal distention, nausea and vomiting.   CT scan of abdomen pelvis obtained and surgical site consultation.  NG tube was placed, eventually clamped, removed and diet advanced.   Hospital Course: Steven Ward was admitted to rehab 03/09/2022 for inpatient therapies to consist of PT, ST and OT at least three hours five days a week. Past admission physiatrist, therapy team and rehab RN have worked together to provide customized collaborative inpatient rehab.  Significant lower extremity edema noted and Lasix 20 mg in addition to scheduled 10 mg dose given.  Daily dose then changed to 20 mg daily.  Serum creatinine at baseline.  Lasix increased to 40 mg daily on 6/27 due to persistent lower extremity edema.  Serum creatinine bumped from 1.74-2.11 on 6/27 therefore Lasix decreased back to 20 mg daily.  Requested chaplain visit for depressed mood.  Creatinine trended downward and was 1.85 on 6/29 and 1.83 on 7/5. Referral made to urology.  After patient was discharged home 7/5, pharmacist Edwina Barth recommended discontinuing aspirin. She will call son to inform.  Blood pressures were monitored on TID basis and remained stable on Lopressor 12.5 mg twice daily.  Diabetes has been monitored with ac/hs CBG checks and SSI was use prn for tighter BS control.  Meal coverage discontinued.  Ensure also discontinued between meals. Resume glipizide at discharge.  Rehab course: During patient's stay in rehab weekly team conferences were held to monitor patient's progress, set goals and discuss barriers to discharge. At admission, patient required min assist with transfers and mobility, supervision and set up assistance with regular diet and thin liquids.  He has had  improvement in activity tolerance, balance, postural control as well as ability to compensate for deficits. He has had improvement in functional use RUE/LUE  and RLE/LLE as well as improvement in awareness  Disposition: Home Discharge disposition: 01-Home or Self Care      Diet: carb  modified  Special Instructions: Check fingerstick blood sugars 4 times daily and record. Follow-up with primary care provider regarding glucose control.  Instructed patient and son regarding pressure relief with off-loading coccyx area every 2 hours.   No driving, alcohol consumption or tobacco use.  Discharge Instructions     Ambulatory referral to Neurology   Complete by: As directed    An appointment is requested in approximately: 4 weeks   Ambulatory referral to Physical Medicine Rehab   Complete by: As directed    Hospital follow-up   Discharge patient   Complete by: As directed    Discharge disposition: 01-Home or Self Care   Discharge patient date: 03/22/2022      Allergies as of 03/22/2022       Reactions   Amoxicillin    REACTION: rash, dizziness        Medication List     STOP taking these medications    aspirin 81 MG chewable tablet   feeding supplement Liqd   hydrochlorothiazide 12.5 MG capsule Commonly known as: MICROZIDE   lipase/protease/amylase 12000-38000 units Cpep capsule Commonly known as: CREON       TAKE these medications    apixaban 2.5 MG Tabs tablet Commonly known as: ELIQUIS Take 1 tablet (2.5 mg total) by mouth 2 (two) times daily.   blood glucose meter kit and supplies Kit Dispense based on patient and insurance preference. Use up to four times daily as directed. (FOR ICD-9 250.00, 250.01).   Carbidopa-Levodopa ER 25-100 MG tablet controlled release Commonly known as: SINEMET CR Take 1 tablet by mouth 2 (two) times daily with breakfast and lunch. What changed:  how much to take how to take this when to take this additional instructions   Carbidopa-Levodopa ER 25-100 MG tablet controlled release Commonly known as: SINEMET CR Take 1 tablet by mouth every evening. What changed: You were already taking a medication with the same name, and this prescription was added. Make sure you understand how and when to take each.    collagenase 250 UNIT/GM ointment Commonly known as: SANTYL Apply topically daily.   furosemide 20 MG tablet Commonly known as: LASIX Take 1 tablet (20 mg total) by mouth daily. Start taking on: March 23, 2022   glipiZIDE 5 MG tablet Commonly known as: GLUCOTROL Take one half tablet by mouth once daily. What changed:  how much to take how to take this when to take this additional instructions   Lancet Device Misc Use 1-4 times daily as needed or directed.  DX E11.9   levothyroxine 25 MCG tablet Commonly known as: SYNTHROID Take 1 tablet (25 mcg total) by mouth daily.   loratadine 10 MG tablet Commonly known as: CLARITIN Take 1 tablet (10 mg total) by mouth daily as needed for allergies or rhinitis.   magnesium oxide 400 (240 Mg) MG tablet Commonly known as: MAG-OX Take 1 tablet (400 mg total) by mouth 2 (two) times daily.   metoprolol tartrate 25 MG tablet Commonly known as: LOPRESSOR Take 0.5 tablets (12.5 mg total) by mouth 2 (two) times daily.   multivitamin with minerals Tabs tablet Take 1 tablet by mouth daily. Start taking on: March 23, 2022   ondansetron 4  MG disintegrating tablet Commonly known as: ZOFRAN-ODT Take 1 tablet (4 mg total) by mouth every 8 (eight) hours as needed for nausea or vomiting.   ONE TOUCH ULTRA 2 w/Device Kit Use as directed.   OneTouch Ultra test strip Generic drug: glucose blood USE TO TEST BLOOD SUGAR 3 TIMES A DAY   onetouch ultrasoft lancets Check 3 times daily. E11.9   polyethylene glycol 17 g packet Commonly known as: MIRALAX / GLYCOLAX Take 17 g by mouth daily as needed for moderate constipation.   polyvinyl alcohol 1.4 % ophthalmic solution Commonly known as: LIQUIFILM TEARS Place 2 drops into both eyes as needed for dry eyes (pleas eput at bedside for dry eyes).   tamsulosin 0.4 MG Caps capsule Commonly known as: FLOMAX Take 2 capsules (0.8 mg total) by mouth daily after breakfast. Start taking on: March 23, 2022 What changed:  how much to take when to take this   triamcinolone cream 0.1 % Commonly known as: KENALOG APPLY  CREAM EXTERNALLY TO AFFECTED AREA TWICE DAILY AS NEEDED What changed:  how much to take when to take this reasons to take this additional instructions        Follow-up Information     [name], MD Follow up.   Specialty: Family Medicine Why: Call in 1-2 days to make arrangements for hospital follow-up appointment Contact information: [place] [address] [phone]         [facility] Follow up.   Why: Call in 1-2 days to make arrangements for hospital follow-up appointment Contact information: [address]     [place] [phone] [facility]. Go to.   Specialty: Pulmonology Contact information: [address] [place] [phone] [place]. Call.   Why: Call in 1-2 days to make arrangements for hospital follow-up appointment Contact information: [place] [address] [phone]        Lucas Mallow, MD. Go to.   Specialty: Urology Why: 04/03/2022 at 2:00 pm. Office will mail paperwork and a reminder card to you. Contact information: [place] [address] [phone]                 Signed: Barbie Banner 03/22/2022, 1:23 PM

## 2022-03-13 NOTE — Progress Notes (Addendum)
  Hypoglycemic Event  CBG: 52  Treatment: Ensure  Symptoms: None  Follow-up CBG: Time:1656 CBG Result:59  Possible Reasons for Event: Unknown  Comments/MD notified:Dr Raulkar notified via secured chat.     Steven Ward   Update at 1709   Hypoglycemic Event  CBG: 59  Treatment: Offered pt coke, only drank one sip. Gave pt 1 4g glucose tablet  Symptoms: None  Follow-up CBG: Time:1709 CBG Result: 71  Possible Reasons for Event: Unknown  Comments/MD notified:Dr Raulkar notified via secured chat.

## 2022-03-13 NOTE — Progress Notes (Signed)
Physical Therapy Session Note  Patient Details  Name: Steven Ward MRN: [id number] Date of Birth: [birth year]  Today's Date: 03/13/2022 PT Individual Time: 1135-1205 PT Individual Time Calculation (min): 30 min   Short Term Goals: Week 1:  PT Short Term Goal 1 (Week 1): STG = LTG  Skilled Therapeutic Interventions/Progress Updates:    Patient with NT going to bathroom.  Took over and pt requesting more time.  After time given discussed walking to help stimulate bowels.  Patient agreeable.  Sit to stand to RW using grabbar with minguard A.  Assisted for hygiene and pt donned brief and pants with min A.  Patient ambulated with RW to dayroom x 120'.  Seated for LAQ, hip flexion, heel raises x 10 with 2.5# weights.  Squeezing small ball between knees x 10 w/ 5 sec hold.  Patient sit to stand with CGA and ambulated to room.  Assisted to wheelchair and left with seat belt alarm active and call bell within reach.   Therapy Documentation Precautions:  Precautions Precautions: Fall Precaution Comments: Parkinson's Restrictions Weight Bearing Restrictions: No  Pain: Pain Assessment Pain Score: 0-No pain  Balance: Balance Balance Assessed: Yes Standardized Balance Assessment Standardized Balance Assessment: Timed Up and Go Test Timed Up and Go Test TUG: Normal TUG Normal TUG (seconds): 26 (AVG of 3. 29, 24, 25 seconds. Using RW)   Therapy/Group: Individual Therapy  Elray Mcgregor [place], Shelly 03/13/2022, 12:14 PM

## 2022-03-13 NOTE — Progress Notes (Signed)
Assisted up to BR with walker, no BM or urinary output, positive flatus. Placed patient in wheelchair with bed alarm monitor on and safety chair belt, no acute distress or complaint

## 2022-03-14 LAB — BASIC METABOLIC PANEL
Anion gap: 6 (ref 5–15)
BUN: 40 mg/dL — ABNORMAL HIGH (ref 8–23)
CO2: 25 mmol/L (ref 22–32)
Calcium: 8.3 mg/dL — ABNORMAL LOW (ref 8.9–10.3)
Chloride: 110 mmol/L (ref 98–111)
Creatinine, Ser: 2.11 mg/dL — ABNORMAL HIGH (ref 0.61–1.24)
GFR, Estimated: 28 mL/min — ABNORMAL LOW (ref >60)
Glucose, Bld: 81 mg/dL (ref 70–99)
Potassium: 4.1 mmol/L (ref 3.5–5.1)
Sodium: 141 mmol/L (ref 135–145)

## 2022-03-14 LAB — GLUCOSE, CAPILLARY
Glucose-Capillary: 203 mg/dL — ABNORMAL HIGH (ref 70–99)
Glucose-Capillary: 110 mg/dL — ABNORMAL HIGH (ref 70–99)
Glucose-Capillary: 148 mg/dL — ABNORMAL HIGH (ref 70–99)
Glucose-Capillary: 137 mg/dL — ABNORMAL HIGH (ref 70–99)

## 2022-03-14 MED ORDER — FUROSEMIDE 40 MG PO TABS
40.0000 mg | ORAL_TABLET | Freq: Every day | ORAL | Status: DC
  Administered 2022-03-15: 40 mg via ORAL
  Filled 2022-03-14: qty 1

## 2022-03-14 NOTE — Progress Notes (Signed)
Pt refused to eat dinner due to increased appetite. Pt offered ensure or personal muffin in room and declined. Pt not given meal coverage insulin due to CBG reading and hypoglycemia episode yesterday.

## 2022-03-14 NOTE — Progress Notes (Addendum)
Occupational Therapy Session Note  Patient Details  Name: Steven Ward MRN: [id number] Date of Birth: 1924-10-05  Today's Date: 03/14/2022 OT Individual Time: 0732-0828 OT Individual Time Calculation (min): 56 min    Short Term Goals: Week 1:  OT Short Term Goal 1 (Week 1): STGS=LTGs due to ELOS  Skilled Therapeutic Interventions/Progress Updates:    Patient received mid toilet transfer with nursing staff.  Patient reports sleeping "ok" last night.  Patient indicates he does not wish to return to son's home.  When asked about this - he responded they both work.  When asked if worried about being a burden - he indicated "tat's part of it."  When asked if he had conversation with his son he said he had - and son indicated "do want you want."  Uncertain of next step in terms of discharge plan for patient.  Patient agrees to feeling "kind of" depressed.  He reports feeling of fatigue often, disinterest in many activities.  Patient seen for skilled OT intervention to address increased autonomy with basic self care skills.  Patient is showig steady improvement with functional mobility, and activity tolerance.  Patient with aversion to bending forward - offered a reacher to aide with LB dressing. Patient was able to use successfully with cueing.   Patient left up in wheelchair with safety belt in place and engaged, and personal items in reach.    Therapy Documentation Precautions:  Precautions Precautions: Fall Precaution Comments: Parkinson's Restrictions Weight Bearing Restrictions: No  Pain:  Patient reports no pain      Therapy/Group: Individual Therapy  Collier Salina 03/14/2022, 8:29 AM

## 2022-03-15 LAB — BASIC METABOLIC PANEL
Anion gap: 10 (ref 5–15)
BUN: 40 mg/dL — ABNORMAL HIGH (ref 8–23)
CO2: 22 mmol/L (ref 22–32)
Calcium: 8.4 mg/dL — ABNORMAL LOW (ref 8.9–10.3)
Chloride: 109 mmol/L (ref 98–111)
Creatinine, Ser: 1.99 mg/dL — ABNORMAL HIGH (ref 0.61–1.24)
GFR, Estimated: 30 mL/min — ABNORMAL LOW (ref >60)
Glucose, Bld: 197 mg/dL — ABNORMAL HIGH (ref 70–99)
Potassium: 3.9 mmol/L (ref 3.5–5.1)
Sodium: 141 mmol/L (ref 135–145)

## 2022-03-15 LAB — URINALYSIS, ROUTINE W REFLEX MICROSCOPIC
Bilirubin Urine: NEGATIVE
Glucose, UA: NEGATIVE mg/dL
Hgb urine dipstick: NEGATIVE
Ketones, ur: NEGATIVE mg/dL
Nitrite: NEGATIVE
Protein, ur: NEGATIVE mg/dL
Specific Gravity, Urine: 1.012 (ref 1.005–1.030)
pH: 5 (ref 5.0–8.0)

## 2022-03-15 LAB — GLUCOSE, CAPILLARY
Glucose-Capillary: 127 mg/dL — ABNORMAL HIGH (ref 70–99)
Glucose-Capillary: 161 mg/dL — ABNORMAL HIGH (ref 70–99)
Glucose-Capillary: 176 mg/dL — ABNORMAL HIGH (ref 70–99)
Glucose-Capillary: 129 mg/dL — ABNORMAL HIGH (ref 70–99)

## 2022-03-15 MED ORDER — NAPHAZOLINE-GLYCERIN 0.012-0.25 % OP SOLN: 1.0000 [drp] | Freq: Four times a day (QID) | OPHTHALMIC | Status: DC | PRN

## 2022-03-15 MED ORDER — FUROSEMIDE 20 MG PO TABS
20.0000 mg | ORAL_TABLET | Freq: Every day | ORAL | Status: DC
  Administered 2022-03-16 – 2022-03-22 (×7): 20 mg via ORAL
  Filled 2022-03-15 (×7): qty 1

## 2022-03-15 MED ORDER — TAMSULOSIN HCL 0.4 MG PO CAPS
0.8000 mg | ORAL_CAPSULE | Freq: Every day | ORAL | Status: DC
Start: 2022-03-16 — End: 2022-03-22
  Administered 2022-03-16 – 2022-03-22 (×7): 0.8 mg via ORAL
  Filled 2022-03-15 (×7): qty 2

## 2022-03-15 MED ORDER — COLLAGENASE 250 UNIT/GM EX OINT
TOPICAL_OINTMENT | Freq: Every day | CUTANEOUS | Status: DC
  Filled 2022-03-15: qty 30

## 2022-03-15 NOTE — Telephone Encounter (Signed)
Apt scheduled.  

## 2022-03-15 NOTE — Plan of Care (Signed)
Pt's plan of care adjusted to QD after speaking with care team and discussed with MD in team conference as pt currently unable to tolerate current therapy schedule with OT, PT, and SLP.   

## 2022-03-15 NOTE — Patient Care Conference (Cosign Needed)
Inpatient RehabilitationTeam Conference and Plan of Care Update Date: 03/15/2022   Time: 11:09 AM    Patient Name: Steven Ward      Medical Record Number: [id number]  Date of Birth: [birth year] Sex: Male         Room/Bed: 4W10C/4W10C-01 Payor Info: Payor: [employer] / Plan: [employer] PPO / Product Type: *No Product type* /    Admit Date/Time:  03/09/2022  6:07 PM  Primary Diagnosis:  CVA (cerebral vascular accident) [facility])  Hospital Problems: Principal Problem:   CVA (cerebral vascular accident) ([place]) Active Problems:   Primary hypertension   Parkinson's disease ([place])   Small bowel obstruction (HCC)   Stage 3b chronic kidney disease ([place])   Cerebrovascular accident (CVA) due to embolism of right cerebellar artery ([place])   Protein-calorie malnutrition, severe    Expected Discharge Date: Expected Discharge Date: 03/22/22  Team Members Present: Physician leading conference: Dr. Leeroy Cha Social Worker Present: Ovidio Kin, LCSW Nurse Present: Dorien Chihuahua, RN PT Present: Ginnie Smart, PT OT Present: Willeen Cass, OT SLP Present: Helaine Chess, SLP PPS Coordinator present : Ileana Ladd, PT     Current Status/Progress Goal Weekly Team Focus  Bowel/Bladder   pt continent of b/b  Remain continent of b/b  Assit with toileting qs   Swallow/Nutrition/ Hydration             ADL's   mod assist for lower body, set up upper body  set-up- supervision  Question depression, need to establish if SNF or home, increase activity tolerance, balance   Mobility   modA bed mobility, CGA for sit<>stand and stand<>pivot transfers using RW. Gait 131f with close supervision and RW, CGA/minA navigating 12 steps using 2 rails  Supervision  General endurance training and strengthening, functional mobility, gait training, stair training, DC planning   Communication             Safety/Cognition/ Behavioral Observations            Pain   no c/o pain   maintain scale of 0 for pain  Assess pain qs   Skin   unstageable coccyx pressure injury, +3 pitting bilateral edmea on LE  Change dressings qs, elevate LE  Assit with q2 turns and orders of proper dressing changes     Discharge Planning:  Pt wants to go to a NH from here too high level and insurance will not cover. Wants ALF options aware will go home then can continue to pursue ALF-son is aware of this and questions reason why   Team Discussion: Patient with dry eyes and pain addressed. MD adjusting lasix for edema of LE. Depression addressed.  Note urinary retention; required cath and unstageable wound on sacral area. Poor activity tolerance overall.  Patient on target to meet rehab goals: Currently needs CGA for sit - stand and ambulates up to 150' with supervision. Completes 12 steps with CGA. Needs mod assist for lower body dressing and help with wound care.   *See Care Plan and progress notes for long and short-term goals.   Revisions to Treatment Plan:  N/a   Teaching Needs: Safety, wound care, I+O caths for retention vs foley care/pericare, medications, toileting, etc  Current Barriers to Discharge: Decreased caregiver support and Home enviroment access/layout  Possible Resolutions to Barriers: Family education HH follow up services DME: RW     Medical Summary Current Status: dry eyes, AKI, lower extremity edema, depressed mood, unstageable to sacrum, type 2 DM  Barriers to Discharge:  Medical stability;Wound care  Barriers to Discharge Comments: dry eyes, AKI, lower extremity edema, depressed mood, unstageable to sacrum, type 2 DM Possible Resolutions to [employer]: ordered eye drops, repeat creatinine today and tomorrow, continue to monitor swelling, chaplain support, decreased CBGs to [facility]   Continued Need for Acute Rehabilitation Level of Care: The patient requires daily medical management by a physician with specialized training in physical medicine and  rehabilitation for the following reasons: Direction of a multidisciplinary physical rehabilitation program to maximize functional independence : Yes Medical management of patient stability for increased activity during participation in an intensive rehabilitation regime.: Yes Analysis of laboratory values and/or radiology reports with any subsequent need for medication adjustment and/or medical intervention. : Yes   I attest that I was present, lead the team conference, and concur with the assessment and plan of the team.   Margarito Liner 03/15/2022, 6:02 PM

## 2022-03-15 NOTE — Progress Notes (Addendum)
PROGRESS NOTE   Subjective/Complaints: Complains of dry eyes- drops ordered No other complaints Discussed that Cr has increased, will decrease Lasix to '20mg'$ , encouraged oral hydration  ROS: +pain from sacral pressure injury, +lower extremity edema, sleeping ok, +dry eyes   Objective:   No results found. No results for input(s): "WBC", "HGB", "HCT", "PLT" in the last 72 hours. Recent Labs    03/14/22 1123  NA 141  K 4.1  CL 110  CO2 25  GLUCOSE 81  BUN 40*  CREATININE 2.11*  CALCIUM 8.3*    Intake/Output Summary (Last 24 hours) at 03/15/2022 0949 Last data filed at 03/15/2022 0730 Gross per 24 hour  Intake 480 ml  Output 1 ml  Net 479 ml     Pressure Injury 02/25/22 Coccyx Unstageable - Full thickness tissue loss in which the base of the injury is covered by slough (yellow, tan, gray, green or brown) and/or eschar (tan, brown or black) in the wound bed. (Active)  02/25/22 2100  Location: Coccyx  Location Orientation:   Staging: Unstageable - Full thickness tissue loss in which the base of the injury is covered by slough (yellow, tan, gray, green or brown) and/or eschar (tan, brown or black) in the wound bed.  Wound Description (Comments):   Present on Admission: Yes    Physical Exam: Vital Signs Blood pressure (!) 126/59, pulse 65, temperature 97.8 F (36.6 C), temperature source Oral, resp. rate 18, height '5\' 8"'$  (1.727 m), weight 78.8 kg, SpO2 97 %.  General: No acute distress, BMI 26.41 Mood and affect are appropriate Heart: Bradycardia, no rubs murmurs or extra sounds Lungs: Clear to auscultation, breathing unlabored, no rales or wheezes Abdomen: Positive bowel sounds, soft nontender to palpation, nondistended Skin: unstagable to coccyx Neuro: Alert and Oriented x 4, follows commands, sensation intact to LT in all 4 extremities. Able to name and repeat. Speech appears fluent.  No pronator drift. Cranial  nerves II through XII intact other than tongue deviation to Rt. Bradykinesia Resting tremor more on RUE than LUE FTN slow intact b/l Strength 4/5 in b/l UE 3/5 proximal LE, 4/5 distal LE Musculoskeletal:  Increased tone/Rigidity at in b/l UE and LE practically at elbows and knees with decreased extension at b/l Knees No joint redness noted No joint tenderness elicited Psych: depressed mood   Assessment/Plan: 1. Functional deficits which require 3+ hours per day of interdisciplinary therapy in a comprehensive inpatient rehab setting. Physiatrist is providing close team supervision and 24 hour management of active medical problems listed below. Physiatrist and rehab team continue to assess barriers to discharge/monitor patient progress toward functional and medical goals  Care Tool:  Bathing    Body parts bathed by patient: Right arm, Left arm, Chest, Abdomen, Front perineal area, Buttocks, Right upper leg, Left upper leg, Face   Body parts bathed by helper: Left lower leg, Right lower leg     Bathing assist Assist Level: Minimal Assistance - Patient > 75%     Upper Body Dressing/Undressing Upper body dressing   What is the patient wearing?: Pull over shirt    Upper body assist Assist Level: Supervision/Verbal cueing    Lower Body Dressing/Undressing Lower  body dressing      What is the patient wearing?: Pants, Incontinence brief     Lower body assist Assist for lower body dressing: Moderate Assistance - Patient 50 - 74%     Toileting Toileting Toileting Activity did not occur Landscape architect and hygiene only): N/A (no void or bm)  Toileting assist Assist for toileting: Minimal Assistance - Patient > 75% Assistive Device Comment: walker   Transfers Chair/bed transfer  Transfers assist     Chair/bed transfer assist level: Contact Guard/Touching assist     Locomotion Ambulation   Ambulation assist      Assist level: Contact Guard/Touching  assist Assistive device: Walker-rolling Max distance: 120'   Walk 10 feet activity   Assist     Assist level: Contact Guard/Touching assist Assistive device: Walker-rolling   Walk 50 feet activity   Assist    Assist level: Contact Guard/Touching assist Assistive device: Walker-rolling    Walk 150 feet activity   Assist    Assist level: Supervision/Verbal cueing Assistive device: Walker-rolling    Walk 10 feet on uneven surface  activity   Assist     Assist level: Minimal Assistance - Patient > 75% Assistive device: Walker-rolling   Wheelchair     Assist Is the patient using a wheelchair?: No             Wheelchair 50 feet with 2 turns activity    Assist            Wheelchair 150 feet activity     Assist          Blood pressure (!) 126/59, pulse 65, temperature 97.8 F (36.6 C), temperature source Oral, resp. rate 18, height '5\' 8"'$  (1.727 m), weight 78.8 kg, SpO2 97 %.  Medical Problem List and Plan: 1. Functional deficits secondary to acute punctate infarct within the right cerebellar hemisphere, possible right frontal lobe infarct likely cardioembolic in the setting of paroxysmal atrial fibrillation             -patient may shower             -ELOS/Goals: 9-12 days             Continue CIR  -Interdisciplinary Team Conference today    Family ed and d/c tomorrow 2.  Antithrombotics: -DVT/anticoagulation:  Pharmaceutical: Other (comment)Eliquis 2.5 mg daily             -antiplatelet therapy: aspirin 81 mg daily 3. Pain from sacral pressure injury: ContinueTylenol to 1,'000mg'$  scheduled TID and air mattress ordered. Kpad ordered for muscles of lower back. Repeat CMP tomorrow 4. Depressed mood: chaplain consulted 5. Neuropsych/cognition: This patient is capable of making decisions on his own behalf. 6. Gluteal fold Stage 2 pressure injury, skin injury over coccyx. Air mattress ordered.  7. Fluids/Electrolytes/Nutrition: Routine Is  and Os and follow-up chemistries 8: Atrial fibrillation, paroxysmal:  -continue Lopressor 12.5 mg BID -continue Eliquis 9: Parkinson's disease: continue sinemet CR 10: DM-2: continue CBG monitoring and SSI             -was recently started on glipizide titrated up to 5 mg daily at home (not restarted) A1c = 7.9 on 6/9, CBGs well controlled overall  D/c meal coverage CBG (last 3)  Recent Labs    03/14/22 1625 03/14/22 2058 03/15/22 0613  GLUCAP 148* 137* 127*    11: Hypertension: continue to monitor, BP overall well controlled,sometimes a little soft             -  continue Lopressor Vitals:   03/14/22 1919 03/15/22 0522  BP: 136/62 (!) 126/59  Pulse: 74 65  Resp: 18 18  Temp: 98.3 F (36.8 C) 97.8 F (36.6 C)  SpO2: 97% 97%    12: CKD stage 3b: elevated BUN and creatinine: Baseline Cr 1.6 -2 follow-up chemistries and ensure adequate PO fluids. Repeat creatinine today    Latest Ref Rng & Units 03/14/2022   11:23 AM 03/10/2022    8:17 AM 03/08/2022    5:32 AM  BMP  Glucose 70 - 99 mg/dL 81  136  136   BUN 8 - 23 mg/dL 40  30  35   Creatinine 0.61 - 1.24 mg/dL 2.11  1.74  1.62   Sodium 135 - 145 mmol/L 141  139  139   Potassium 3.5 - 5.1 mmol/L 4.1  4.4  4.1   Chloride 98 - 111 mmol/L 110  111  112   CO2 22 - 32 mmol/L '25  23  22   '$ Calcium 8.9 - 10.3 mg/dL 8.3  7.9  7.7     13: Pulmonary plaques/LLL mass: follow-up as outpatient with pulmonology 14: Chronic pancreatic insufficiency, on creon 15: BPH: continue Flomax and monitor for retention             --has external urine collection system; voiding trial 16: Hypothyroidism: continue Synthroid 25 mcg daily 18: SBO: resolved; now on regular diet             -last BM 6/22 19: recent Zoster to face 20: Anemia: hgb stable, repeat Monday 21. RUL opacities, he was treated with ceftriaxone and flagyl 22. Lower extremity edema:Decrease lasix to '20mg'$  daily given AKI, repeat creatinine today 23. Urinary retention: will place  foley and schedule outpatient urology follow-up      LOS: 6 days A FACE TO FACE EVALUATION WAS PERFORMED  Steven Ward 03/15/2022, 9:49 AM

## 2022-03-15 NOTE — Progress Notes (Signed)
Occupational Therapy Session Note  Patient Details  Name: Steven Ward MRN: [id number] Date of Birth: 04-23-1925  Today's Date: 03/15/2022 OT Individual Time: 7628-3151 OT Individual Time Calculation (min): 60 min    Short Term Goals: Week 1:  OT Short Term Goal 1 (Week 1): STGS=LTGs due to ELOS Week 2:     Skilled Therapeutic Interventions/Progress Updates:    Patient in restroom with rehab tech upon arrival, pt sit to stand with SBA using the grab bars for balance for returning to w/c LOF.  The pt agreed to work on BADL related task in bathing and dressing , the pt was able to wash his face with s/u assist, he was able to doff a overhead shirt with MinA and additional time.  The pt was able to wash his UB with initial verbal cues and additional time. The pt was able to wash his LB with ModA and heavy vc's for task performance.   The pt was able to donn his LB garments with MaxA for donning his brief and threading the pants onto his feet and over his bottom. The pt was able to complete a grooming task for brushing his teeth and combing his hair with s/u assist. The pt was transferred to his recliner with MinA using a stand pivot transfer. The pt was instructed in UB exercises using medium grade theraband 2 sets of 10 for shld flex, chest press and horizontal abduction, the pt required 2 rest breaks and demonstration following intial cues.  At the end of treatment the bed side table and call light were within reach and the chair alarm was activated.  The pt reported back pain of 6 on a 0-10 pain scale to nursing, who provide tylenol .  All additional needs were addressed prior to exiting the pt's room.  Therapy Documentation Precautions:  Precautions Precautions: Fall Precaution Comments: Parkinson's Restrictions Weight Bearing Restrictions: No   Therapy/Group: Individual Therapy  Yvonne Kendall 03/15/2022, 12:26 PM

## 2022-03-15 NOTE — Progress Notes (Deleted)
Inpatient Rehabilitation Discharge Medication Review by a Pharmacist  A complete drug regimen review was completed for this patient to identify any potential clinically significant medication issues.  High Risk Drug Classes Is patient taking? Indication by Medication  Antipsychotic No   Anticoagulant Yes Apixaban - CVA/Afib  Antibiotic No   Opioid No   Antiplatelet Yes ASA-CVA ppx  Hypoglycemics/insulin Yes Glucotrol - DM?  Vasoactive Medication Yes Metoprolol - AFib Furosemide - fluid Tamsulosin - BPH  Chemotherapy No   Other Yes Sinemet - Parkinson's Levothyroxine - low thryoid     Type of Medication Issue Identified Description of Issue Recommendation(s)  Drug Interaction(s) (clinically significant)     Duplicate Therapy     Allergy     No Medication Administration End Date     Incorrect Dose     Additional Drug Therapy Needed     Significant med changes from prior encounter (inform family/care partners about these prior to discharge).    Other       Clinically significant medication issues were identified that warrant physician communication and completion of prescribed/recommended actions by midnight of the next day:  No  Pharmacist comments: Likely resuming home glucotrol for DM  Time spent performing this drug regimen review (minutes):  20 minutes   Tad Moore 03/15/2022 12:52 PM

## 2022-03-15 NOTE — Progress Notes (Signed)
Physical Therapy Session Note  Patient Details  Name: Steven Ward MRN: [id number] Date of Birth: [birth year]  Today's Date: 03/15/2022 PT Individual Time: 0800-0859 + 0737-1062 PT Individual Time Calculation (min): 59 min  + 63 min  Short Term Goals: Week 1:  PT Short Term Goal 1 (Week 1): STG = LTG  Skilled Therapeutic Interventions/Progress Updates:      1st session: Pt supine in bed to start - agreeable to PT tx. Reports unrated LBP, chronic in nature. Rest breaks and mobility provided for pain management. Donned knee-high compression socks with dependant assist for time management. Supine<>sitting EOB with supervision with HOB raised and reliance on bed rails. Required modA for donning sweat pants, including threading and pulling up in standing. Ambulatory transfer with supervision and RW to w/c and then transported to main rehab gym for time and energy conservation.   Provided HEP via medbridge. See below for details. Completed all there-ex with PT supervision and cues for muscle activation, sequencing, and technique. Handout provided.  Access Code: [facility] URL: [email or web address] Date: 03/15/2022 Prepared by: Ginnie Smart  Exercises - Sit to Stand with Counter Support  - 1 x daily - 7 x weekly - 3 sets - 10 reps - Standing March with Counter Support  - 1 x daily - 7 x weekly - 3 sets - 10 reps - Standing Hip Abduction with Counter Support  - 1 x daily - 7 x weekly - 3 sets - 10 reps - Heel Raises with Counter Support  - 1 x daily - 7 x weekly - 3 sets - 10 reps - Mini Squat with Counter Support  - 1 x daily - 7 x weekly - 3 sets - 10 reps  Transported outside in w/c near [facility] to brighten mood and affect. Completed seated there-ex: -1x10 shldr press with 3# dowel rod -1x10 front shldr raise with 3# dowel rod -1x10 chest press with 3# dowel rod -1x45second + 1x25second rows -2x10 LAQ with yellow TB resistance bilaterally  Pt returned upstairs to  room. Remained seated in w/c with safety belt alarm on. All needs met with call bell in reach.   2nd session: Pt supine in bed sleeping to start - awakens to voice and agreeable to PT session despite fatigue. Supine<>sitting EOB with supervision with hospital bed features. Requires modA for sit<>Stand to RW with delayed initiation, likely fatigue related. Ambulatory transfer with CGA and RW to w/c and then transported to day room rehab gym for time management and energy conservation.   Setup on Kinetron while seated in w/c with resistance set to 20cm/sec. Completed Kinetron for x3 minutes and then pt reporting urgent need to void. Returned to room and completed stand<>pivot transfer with CGA and grab bars to toilet. NT in the room and requesting a urine sample with urinal. Pt unable to void despite urge and time. Returned to rehab gym in w/c and completed Kinetron for x5 minutes with setupA as above.   Gait training during session 3x122ft with supervision and RW - cues for increasing step height bilaterally and for upward gaze. Dynamic standing balance training with unsupported horseshoe toss and alt toe taps with RW support. Both activities completed with close supervision for safety.   Pt reporting fatigue and requesting to lie down. Requires minA for bed mobility for BLE management and maxA for scooting up in the bed for optimal repositioning. Pt missed 12 minutes of therapy due to fatigue.    Therapy Documentation Precautions:  Precautions Precautions: Fall  Precaution Comments: Parkinson's Restrictions Weight Bearing Restrictions: No General:    Therapy/Group: Individual Therapy  [name] 03/15/2022, 7:28 AM

## 2022-03-15 NOTE — Progress Notes (Addendum)
Patient ID: Steven Ward, male   DOB: [birth year], 88 y.o.   MRN: [id number] Spoke with son and daughter in-law via telephone who are upset regarding discharge date and needs.Both report they are going out of town this weekend and now do not have 48 hr notice to hire CNA coverage. Daughter in-law had numerous concerns regarding medical issues-BS, voiding and BP. Have messaged team and MD concerns and to call son to address concerns. Will await MD call and work on discharge plans.  1:30 PM After families discussion with MD have decided discharge 7/5, will be working on voiding issue and pressure wound

## 2022-03-16 LAB — GLUCOSE, CAPILLARY
Glucose-Capillary: 165 mg/dL — ABNORMAL HIGH (ref 70–99)
Glucose-Capillary: 165 mg/dL — ABNORMAL HIGH (ref 70–99)
Glucose-Capillary: 86 mg/dL (ref 70–99)
Glucose-Capillary: 230 mg/dL — ABNORMAL HIGH (ref 70–99)

## 2022-03-16 LAB — COMPREHENSIVE METABOLIC PANEL
ALT: 5 U/L (ref 0–44)
AST: 40 U/L (ref 15–41)
Albumin: 1.8 g/dL — ABNORMAL LOW (ref 3.5–5.0)
Alkaline Phosphatase: 64 U/L (ref 38–126)
Anion gap: 7 (ref 5–15)
BUN: 43 mg/dL — ABNORMAL HIGH (ref 8–23)
CO2: 25 mmol/L (ref 22–32)
Calcium: 8.4 mg/dL — ABNORMAL LOW (ref 8.9–10.3)
Chloride: 111 mmol/L (ref 98–111)
Creatinine, Ser: 1.85 mg/dL — ABNORMAL HIGH (ref 0.61–1.24)
GFR, Estimated: 33 mL/min — ABNORMAL LOW (ref >60)
Glucose, Bld: 167 mg/dL — ABNORMAL HIGH (ref 70–99)
Potassium: 3.8 mmol/L (ref 3.5–5.1)
Sodium: 143 mmol/L (ref 135–145)
Total Bilirubin: 0.4 mg/dL (ref 0.3–1.2)
Total Protein: 5.7 g/dL — ABNORMAL LOW (ref 6.5–8.1)

## 2022-03-16 NOTE — Progress Notes (Signed)
Occupational Therapy Weekly Progress Note  Patient Details  Name: Steven Ward MRN: [id number] Date of Birth: 1925-05-13  Beginning of progress report period: March 10, 2022 End of progress report period: March 16, 2022  Today's Date: 03/16/2022 OT Individual Time: 7543-6067 OT Individual Time Calculation (min): 40 min    Patient has met 1 of 4 short term goals.  Patient has declined showering on multiple occasions, patient is showing improvement with overall functional mobility, and ability to use adaptive equipment to aide with LB self care.  Patient is not motivated to return home, and therefore he reports seeing little point in rehab process stating " nothing helps."  Question possible depression.    Patient continues to demonstrate the following deficits: muscle weakness, decreased cardiorespiratoy endurance, and decreased sitting balance, decreased standing balance, decreased postural control, and decreased balance strategies and therefore will continue to benefit from skilled OT intervention to enhance overall performance with BADL.  Patient  Patient is making some improvement, but has limited ability to actively participate in rehab process due to fatigue and possible depression .  Plan of care revisions: Patient moved to less intense schedule for therapy to accomodate medical/ psychological status.  OT Short Term Goals No short term goals set  Skilled Therapeutic Interventions/Progress Updates:    Patient received supine in air bed declining therapy, stating, "I just don't feel like it today,"  and  "I stubbed my toe yesterday."    With encouragement, patient agreeable to sit edge of bed for foot care.  Patient's physiatrist in to see him during this time, and discussing discharge plans.  Still stating he is not going home.  Social worker in to drop off information regarding Assisted Living Facilities.  Patient states after they left that it is not fait to his son and daughter in law  to have to deal with him.  Reassured patient that he did not require much more care than prior, but patient states he just does not have the energy he had prior, and does not think he would be smart to go back home with family.    Therapy Documentation Precautions:  Precautions Precautions: Fall Precaution Comments: Parkinson's Restrictions Weight Bearing Restrictions: No General: General OT Amount of Missed Time: 20 Minutes    Pain: Unscored pain in low back - and sacrum Patient stated he stubbed his toe - but denied voltaren gel, and ice.  "Not hurting now"   Therapy/Group: Individual Therapy  Mariah Milling 03/16/2022, 9:22 AM

## 2022-03-16 NOTE — Progress Notes (Signed)
Physical Therapy Session Note  Patient Details  Name: LUGENE BEOUGHER MRN: [id number] Date of Birth: 06-06-1925  Today's Date: 03/16/2022 PT Individual Time: 1300-1323 PT Individual Time Calculation (min): 23 min   Short Term Goals: Week 1:  PT Short Term Goal 1 (Week 1): STG = LTG  Skilled Therapeutic Interventions/Progress Updates:     Seen for missed time earlier today.   Pt resting in bed on arrival - wakens to voice. At first he's resistive to therapy session. Politley refusing. Offered several forms of mobility to mobilize OOB but he continued to refuse. Ultimately, he was agreeable to bed level there-ex only. Played music in his room to improve mood and engagement which helped some.  -1x12 heel slides bilaterally AAROM -1x12 hip abduction bilaterally AAROM -1x20 ankle pumps bilaterally AROM -1x12 SAQ bilaterally AAROM  Assisted pt rolling in bed onto his L side for pressure relief - roll's in bed with minA with use of bed rails. Positioned comfortably. All needs met, call bell in reach.    Therapy Documentation Precautions:  Precautions Precautions: Fall Precaution Comments: Parkinson's Restrictions Weight Bearing Restrictions: No    Therapy/Group: Individual Therapy  Glenis Musolf P Evans Levee PT 03/16/2022, 1:14 PM

## 2022-03-16 NOTE — Progress Notes (Signed)
PROGRESS NOTE   Subjective/Complaints: No new complaints this morning Says he will not go back home, wants to got to nursing home, appreciate SW assistance with this but discussed with patient unlikely  ROS: +pain from sacral pressure injury, +lower extremity edema- not painful, sleeping ok, +dry eyes   Objective:   No results found. No results for input(s): "WBC", "HGB", "HCT", "PLT" in the last 72 hours. Recent Labs    03/15/22 0931 03/16/22 0527  NA 141 143  K 3.9 3.8  CL 109 111  CO2 22 25  GLUCOSE 197* 167*  BUN 40* 43*  CREATININE 1.99* 1.85*  CALCIUM 8.4* 8.4*    Intake/Output Summary (Last 24 hours) at 03/16/2022 1131 Last data filed at 03/16/2022 5465 Gross per 24 hour  Intake 478 ml  Output 1 ml  Net 477 ml     Pressure Injury 02/25/22 Coccyx Unstageable - Full thickness tissue loss in which the base of the injury is covered by slough (yellow, tan, gray, green or brown) and/or eschar (tan, brown or black) in the wound bed. (Active)  02/25/22 2100  Location: Coccyx  Location Orientation:   Staging: Unstageable - Full thickness tissue loss in which the base of the injury is covered by slough (yellow, tan, gray, green or brown) and/or eschar (tan, brown or black) in the wound bed.  Wound Description (Comments):   Present on Admission: Yes    Physical Exam: Vital Signs Blood pressure 124/65, pulse 68, temperature 98 F (36.7 C), resp. rate 17, height '5\' 8"'$  (1.727 m), weight 78.8 kg, SpO2 97 %.  General: No acute distress, BMI 26.41 Mood and affect are appropriate Heart: Bradycardia, no rubs murmurs or extra sounds Lungs: Clear to auscultation, breathing unlabored, no rales or wheezes Abdomen: Positive bowel sounds, soft nontender to palpation, nondistended Skin: unstagable to coccyx Neuro: Alert and Oriented x 4, follows commands, sensation intact to LT in all 4 extremities. Able to name and repeat.  Speech appears fluent.  No pronator drift. Cranial nerves II through XII intact other than tongue deviation to Rt. Bradykinesia Resting tremor more on RUE than LUE FTN slow intact b/l Strength 4/5 in b/l UE 3/5 proximal LE, 4/5 distal LE Musculoskeletal:  Increased tone/Rigidity at in b/l UE and LE practically at elbows and knees with decreased extension at b/l Knees No joint redness noted No joint tenderness elicited Significant lower extremity edema bilaterally Psych: depressed mood   Assessment/Plan: 1. Functional deficits which require 3+ hours per day of interdisciplinary therapy in a comprehensive inpatient rehab setting. Physiatrist is providing close team supervision and 24 hour management of active medical problems listed below. Physiatrist and rehab team continue to assess barriers to discharge/monitor patient progress toward functional and medical goals  Care Tool:  Bathing    Body parts bathed by patient: Right arm, Left arm, Chest, Abdomen, Front perineal area, Buttocks, Right upper leg, Left upper leg, Face   Body parts bathed by helper: Left lower leg, Right lower leg     Bathing assist Assist Level: Minimal Assistance - Patient > 75%     Upper Body Dressing/Undressing Upper body dressing   What is the patient wearing?: Pull  over shirt    Upper body assist Assist Level: Supervision/Verbal cueing    Lower Body Dressing/Undressing Lower body dressing      What is the patient wearing?: Pants, Incontinence brief     Lower body assist Assist for lower body dressing: Moderate Assistance - Patient 50 - 74%     Toileting Toileting Toileting Activity did not occur Landscape architect and hygiene only): N/A (no void or bm)  Toileting assist Assist for toileting: Minimal Assistance - Patient > 75% Assistive Device Comment: walker   Transfers Chair/bed transfer  Transfers assist     Chair/bed transfer assist level: Contact Guard/Touching assist      Locomotion Ambulation   Ambulation assist      Assist level: Contact Guard/Touching assist Assistive device: Walker-rolling Max distance: 120'   Walk 10 feet activity   Assist     Assist level: Contact Guard/Touching assist Assistive device: Walker-rolling   Walk 50 feet activity   Assist    Assist level: Contact Guard/Touching assist Assistive device: Walker-rolling    Walk 150 feet activity   Assist    Assist level: Supervision/Verbal cueing Assistive device: Walker-rolling    Walk 10 feet on uneven surface  activity   Assist     Assist level: Minimal Assistance - Patient > 75% Assistive device: Walker-rolling   Wheelchair     Assist Is the patient using a wheelchair?: No             Wheelchair 50 feet with 2 turns activity    Assist            Wheelchair 150 feet activity     Assist          Blood pressure 124/65, pulse 68, temperature 98 F (36.7 C), resp. rate 17, height '5\' 8"'$  (1.727 m), weight 78.8 kg, SpO2 97 %.  Medical Problem List and Plan: 1. Functional deficits secondary to acute punctate infarct within the right cerebellar hemisphere, possible right frontal lobe infarct likely cardioembolic in the setting of paroxysmal atrial fibrillation             -patient may shower             -ELOS/Goals: 9-12 days             Continue CIR  Discussed with SW his preference for nursing home  Family ed and d/c tomorrow 2.  Antithrombotics: -DVT/anticoagulation:  Pharmaceutical: Other (comment)Eliquis 2.5 mg daily             -antiplatelet therapy: aspirin 81 mg daily 3. Pain from sacral pressure injury: ContinueTylenol to 1,'000mg'$  scheduled TID and air mattress ordered. Kpad ordered for muscles of lower back. Repeat CMP tomorrow 4. Depressed mood: chaplain consulted 5. Neuropsych/cognition: This patient is capable of making decisions on his own behalf. 6. Gluteal fold Stage 2 pressure injury, skin injury over coccyx.  Air mattress ordered.  7. Fluids/Electrolytes/Nutrition: Routine Is and Os and follow-up chemistries 8: Atrial fibrillation, paroxysmal:  -continue Lopressor 12.5 mg BID -continue Eliquis 9: Parkinson's disease: continue sinemet CR 10: DM-2: continue CBG monitoring and SSI             -was recently started on glipizide titrated up to 5 mg daily at home (not restarted) A1c = 7.9 on 6/9, CBGs well controlled overall  D/c meal coverage CBG (last 3)  Recent Labs    03/15/22 1637 03/15/22 2026 03/16/22 0601  GLUCAP 176* 129* 165*    11: Hypertension: continue to monitor, BP  overall well controlled,sometimes a little soft             -continue Lopressor Vitals:   03/15/22 1926 03/16/22 0302  BP: 108/68 124/65  Pulse: 67 68  Resp: 17 17  Temp: 98 F (36.7 C) 98 F (36.7 C)  SpO2: 95% 97%    12: CKD stage 3b: elevated BUN and creatinine: Baseline Cr 1.6 -2 follow-up chemistries and ensure adequate PO fluids. Cr improving, monitor weekly.    Latest Ref Rng & Units 03/16/2022    5:27 AM 03/15/2022    9:31 AM 03/14/2022   11:23 AM  BMP  Glucose 70 - 99 mg/dL 167  197  81   BUN 8 - 23 mg/dL 43  40  40   Creatinine 0.61 - 1.24 mg/dL 1.85  1.99  2.11   Sodium 135 - 145 mmol/L 143  141  141   Potassium 3.5 - 5.1 mmol/L 3.8  3.9  4.1   Chloride 98 - 111 mmol/L 111  109  110   CO2 22 - 32 mmol/L '25  22  25   '$ Calcium 8.9 - 10.3 mg/dL 8.4  8.4  8.3     13: Pulmonary plaques/LLL mass: follow-up as outpatient with pulmonology 14: Chronic pancreatic insufficiency, on creon 15: BPH: continue Flomax and monitor for retention             --has external urine collection system; voiding trial 16: Hypothyroidism: continue Synthroid 25 mcg daily 18: SBO: resolved; now on regular diet             -last BM 6/22 19: recent Zoster to face 20: Anemia: hgb stable, repeat Monday 21. RUL opacities, he was treated with ceftriaxone and flagyl 22. Lower extremity edema:Decrease lasix to '20mg'$  daily given  AKI, placed nursing order to elevate, ice and apply compression garments 23. Urinary retention: increase flomax to 0.'8mg'$       LOS: 7 days A FACE TO FACE EVALUATION WAS PERFORMED  Clide Deutscher Atley Neubert 03/16/2022, 11:31 AM

## 2022-03-16 NOTE — Progress Notes (Signed)
Physical Therapy Session Note  Patient Details  Name: Steven Ward MRN: [id number] Date of Birth: [birth year]  Today's Date: 03/16/2022 PT Missed Time: 72 Minutes Missed Time Reason: Patient unwilling to participate;Patient fatigue  Short Term Goals: Week 1:  PT Short Term Goal 1 (Week 1): STG = LTG  Skilled Therapeutic Interventions/Progress Updates:      Pt resting in bed on arrival. He requests to "skip" today's therapy. He reports he's "not feeling it" and wants to rest. Unable to encourage him to mobilize or participate in bed level there-ex. He missed 60 minutes of PT.   Therapy Documentation Precautions:  Precautions Precautions: Fall Precaution Comments: Parkinson's Restrictions Weight Bearing Restrictions: No General:     Therapy/Group: Individual Therapy  Finnbar Cedillos P Gursimran Litaker PT 03/16/2022, 7:23 AM

## 2022-03-16 NOTE — Progress Notes (Addendum)
Patient ID: Reshaun P Dombrosky, male   DOB: 08/29/1925, 88 y.o.   MRN: [id number]  Met with pt along with MD he continues to request to not go home and go to a nursing home at discharge. Discussed the unlikelihood of his [employer] covering this, but will try. Have reached out to Stephanie-CM at [facility] to ask this question for pt. Have given pt the ALF list and he can look at this and decide which one he is interested in. He is aware of his discharge date of 7/5. He has not been cathed today and MD feels he will not need this by discharge. Will await Stephanie-[facility] CM return call  10:28 AM Spoke with Stephanie-CM at [facility] who reports it is too early to discuss or evaluate for SNF.  This worker needs to call early next week-Monday-Tuesday to ask for them to evaluate for eligibility for SNF. Will call next week 

## 2022-03-17 LAB — URINE CULTURE: Culture: >=100000 — AB

## 2022-03-17 LAB — GLUCOSE, CAPILLARY
Glucose-Capillary: 157 mg/dL — ABNORMAL HIGH (ref 70–99)
Glucose-Capillary: 105 mg/dL — ABNORMAL HIGH (ref 70–99)
Glucose-Capillary: 132 mg/dL — ABNORMAL HIGH (ref 70–99)
Glucose-Capillary: 157 mg/dL — ABNORMAL HIGH (ref 70–99)

## 2022-03-17 MED ORDER — ENSURE ENLIVE PO LIQD
237.0000 mL | Freq: Two times a day (BID) | ORAL | Status: DC
  Administered 2022-03-18 – 2022-03-21 (×8): 237 mL via ORAL

## 2022-03-17 NOTE — Plan of Care (Signed)
Goals downgraded due to slow progress and apathy towards therapy.   Problem: RH Balance Goal: LTG Patient will maintain dynamic standing balance (PT) Description: LTG:  Patient will maintain dynamic standing balance with assistance during mobility activities (PT) Flowsheets (Taken 03/17/2022 0733) LTG: Pt will maintain dynamic standing balance during mobility activities with:: Contact Guard/Touching assist   Problem: Sit to Stand Goal: LTG:  Patient will perform sit to stand with assistance level (PT) Description: LTG:  Patient will perform sit to stand with assistance level (PT) Flowsheets (Taken 03/17/2022 0733) LTG: PT will perform sit to stand in preparation for functional mobility with assistance level: Minimal Assistance - Patient > 75%   Problem: RH Bed Mobility Goal: LTG Patient will perform bed mobility with assist (PT) Description: LTG: Patient will perform bed mobility with assistance, with/without cues (PT). Flowsheets (Taken 03/17/2022 0733) LTG: Pt will perform bed mobility with assistance level of: Minimal Assistance - Patient > 75%   Problem: RH Bed to Chair Transfers Goal: LTG Patient will perform bed/chair transfers w/assist (PT) Description: LTG: Patient will perform bed to chair transfers with assistance (PT). Flowsheets (Taken 03/17/2022 0733) LTG: Pt will perform Bed to Chair Transfers with assistance level: Supervision/Verbal cueing

## 2022-03-17 NOTE — Plan of Care (Signed)
  Problem: Consults Goal: RH STROKE PATIENT EDUCATION Description: See Patient Education module for education specifics  Outcome: Progressing   Problem: RH BLADDER ELIMINATION Goal: RH STG MANAGE BLADDER WITH ASSISTANCE Description: STG Manage Bladder With toileting Assistance Outcome: Progressing Goal: RH STG MANAGE BLADDER WITH MEDICATION WITH ASSISTANCE Description: STG Manage Bladder With Medication With mod I Assistance. Outcome: Progressing   Problem: RH SKIN INTEGRITY Goal: RH STG MAINTAIN SKIN INTEGRITY WITH ASSISTANCE Description: STG Maintain Skin Integrity With min Assistance. Outcome: Progressing Goal: RH STG ABLE TO PERFORM INCISION/WOUND CARE W/ASSISTANCE Description: STG Able To Perform Incision/Wound Care With min  Assistance. Outcome: Progressing   Problem: RH SAFETY Goal: RH STG ADHERE TO SAFETY PRECAUTIONS W/ASSISTANCE/DEVICE Description: STG Adhere to Safety Precautions With cues Assistance/Device. Outcome: Progressing   Problem: RH KNOWLEDGE DEFICIT Goal: RH STG INCREASE KNOWLEDGE OF DIABETES Description: Patient and son will be able to manage DM with medications and dietary modifications using handouts and educational resources independently Outcome: Progressing Goal: RH STG INCREASE KNOWLEDGE OF HYPERTENSION Description: Patient and son will be able to manage HTN with medications and dietary modifications using handouts and educational resources independently Outcome: Progressing Goal: RH STG INCREASE KNOWLEGDE OF HYPERLIPIDEMIA Description: Patient and son will be able to manage HLD with medications and dietary modifications using handouts and educational resources independently Outcome: Progressing Goal: RH STG INCREASE KNOWLEDGE OF STROKE PROPHYLAXIS Description: Patient and son will be able to manage secondary stroke risks with medications and dietary modifications using handouts and educational resources independently Outcome: Progressing   Problem:  Education: Goal: Ability to describe self-care measures that may prevent or decrease complications (Diabetes Survival Skills Education) will improve Outcome: Progressing Goal: Individualized Educational Video(s) Outcome: Progressing   Problem: Coping: Goal: Ability to adjust to condition or change in health will improve Outcome: Progressing   Problem: Fluid Volume: Goal: Ability to maintain a balanced intake and output will improve Outcome: Progressing   Problem: Health Behavior/Discharge Planning: Goal: Ability to identify and utilize available resources and services will improve Outcome: Progressing Goal: Ability to manage health-related needs will improve Outcome: Progressing   Problem: Metabolic: Goal: Ability to maintain appropriate glucose levels will improve Outcome: Progressing   Problem: Nutritional: Goal: Maintenance of adequate nutrition will improve Outcome: Progressing Goal: Progress toward achieving an optimal weight will improve Outcome: Progressing   Problem: Skin Integrity: Goal: Risk for impaired skin integrity will decrease Outcome: Progressing   Problem: Tissue Perfusion: Goal: Adequacy of tissue perfusion will improve Outcome: Progressing

## 2022-03-17 NOTE — Progress Notes (Signed)
Nutrition Follow-up  DOCUMENTATION CODES:   Severe malnutrition in context of chronic illness  INTERVENTION:   Encourage good PO intake Ensure Enlive po BID, each supplement provides 350 kcal and 20 grams of protein. Continue Multivitamin w/ minerals daily Encourage good PO intake   NUTRITION DIAGNOSIS:   Severe Malnutrition related to chronic illness (exocrine pancreatic insufficiency) as evidenced by severe muscle depletion, severe fat depletion. - Ongoing   GOAL:   Patient will meet greater than or equal to 90% of their needs - Ongoing   MONITOR:   PO intake, Supplement acceptance, Labs, Weight trends  REASON FOR ASSESSMENT:   Malnutrition Screening Tool    ASSESSMENT:   88 yo male admitted to rehab with functional deficits r/t acute ischemic stroke. PMH includes DM-2, HTN, GERD, lumbar spondylosis, colon cancer, exocrine pancreatic insufficiency.  Pt sleeping at time of RD visit, pt woke to RD touch.  Pt reports that he has been eating well. Denies any nausea  or vomiting. States that he has been drinking his Ensures and enjoys the chocolate flavor; states that he had not received one this week.  Per EMR, pt PO intake includes 25-75%.   Per MD note, pt discontinued Ensure today. RD to re-order due to pt being severely malnutrition, inconsistent PO intake, unstageable wound, and pt enjoying to drink them.  Medications reviewed and include: Lasix, NovoLog, Magnesium Oxide, MVI Labs reviewed: BUN 43, Creatinine 1.85, 24 hr CBG 86-230  Diet Order:   Diet Order             Diet Carb Modified Fluid consistency: Thin; Room service appropriate? Yes  Diet effective now                   EDUCATION NEEDS:   No education needs have been identified at this time  Skin:  Skin Integrity Issues:: Unstageable Unstageable: coccyx  Last BM:  6/29  Height:   Ht Readings from Last 1 Encounters:  03/09/22 '5\' 8"'$  (1.727 m)    Weight:   Wt Readings from Last 1  Encounters:  03/17/22 77.1 kg    Ideal Body Weight:  70 kg  BMI:  Body mass index is 25.85 kg/m.  Estimated Nutritional Needs:  Kcal:  1900-2100 Protein:  90-105 gm Fluid:  >/= 1.9 L    Hermina Barters RD, LDN Clinical Dietitian See [facility] for contact information.

## 2022-03-17 NOTE — Progress Notes (Signed)
Occupational Therapy Session Note  Patient Details  Name: Steven Ward MRN: [id number] Date of Birth: 10-30-1924  Today's Date: 03/17/2022 OT Individual Time: 0930-1000 OT Individual Time Calculation (min): 30 min    Short Term Goals: Week 1:  OT Short Term Goal 1 (Week 1): STGS=LTGs due to ELOS Week 2:     Skilled Therapeutic Interventions/Progress Updates:  The  patient was in bed upon arrival, the pt was able to come from supine in bed to EOB with CGA.  The pt was able to come from EOB to stand using the RW for balance with CGA  and  reminders for safety regarding hand placement during  transfers.  The pt was able to complete UB exercises using medium grade theraband  sitting EOB while maintaining good anatomical position  unsupported,  2 sets of 10 for chest press, shld flexion, and horizontal abduction. With 1 rest break.  The pt returned to bed LOF with his bedside table and call light within reach and his bed alarm activated. The pt had no report of pain this treatment session.   Therapy Documentation Precautions:  Precautions Precautions: Fall Precaution Comments: Parkinson's Restrictions Weight Bearing Restrictions: No   Therapy/Group: Individual Therapy  Yvonne Kendall 03/17/2022, 1:02 PM

## 2022-03-17 NOTE — Progress Notes (Signed)
Physical Therapy Weekly Progress Note  Patient Details  Name: Steven Ward MRN: [id number] Date of Birth: 03-31-1925  Beginning of progress report period: March 10, 2022 End of progress report period: March 17, 2022  Today's Date: 03/17/2022 PT Individual Time: 1345-1413 PT Individual Time Calculation (min): 28 min   Patient has met 2 of 8 long term goals.  Pt is making slow progress towards therapy and has been somewhat plateau in progress over the past few days. Limited progress due to apathy towards therapy and self limiting behaviors. His therapy frequency has been decreased to QD to accommodate for this and decreased activity tolerance. Overall, he requires minA for bed mobility with hospital bed features, supervision for unsupported sitting balance. Sit<>stands fluctuate b/w modA and supervision depending on height and availability of arm rests. He ambulates 134f with close supervision and RW and requires CGA for navigating up/down stairs using 1 hand rail. Pt is adamant that he is not returning home to his son's house but rather he requests SNF at discharge as he's concerned about the burden of care and falls risk. CSW and team is aware and assisting with DC planning.   Patient continues to demonstrate the following deficits muscle weakness and muscle joint tightness, decreased cardiorespiratoy endurance, and decreased standing balance, decreased postural control, hemiplegia, and decreased balance strategies and therefore will continue to benefit from skilled PT intervention to increase functional independence with mobility.  Patient progressing toward long term goals..Marland Kitchen LTG downgraded. See POC note for details.  PT Short Term Goals Week 2:  PT Short Term Goal 1 (Week 2): STG = LTG  Skilled Therapeutic Interventions/Progress Updates:     First attempt: -pt on toilet with LPN assisting. Will return when schedule and pt availability permits.   2nd attempt: Pt sleeping in bed but  awakens to voice. He initially refuses therapy but is agreeable with convincing. Pt refuses to don pants and doesn't want to leave his room. In-room therapy only.   Supine<>sitting EOB with supervision with hospital bed features. Requires modA for sit<>stand from EOB (pt in bed all day yesterday due to therapy refusals). Ambulation in room with RW and CGA for holding briefs. Cues for keeping body within walker frame and safety awareness with turns.  Seated there-ex: -1x10 LAQ bilaterally -1x10 hip marches bilaterally -1x10 resistive hamstring curls using red TB -1x12 seated rows with red TB -1x12 seated hammer curls with red TB, bilaterally. -1x10 sit<>stands to RW with cues for split hand placement.  Pt remained seated in recliner with BLE elevated, pillows for support, safety belt alarm on, call bell in lap. All needs met. Encouraged pt stay sitting up for at least 1 hr which he was agreeable to. Played [place] on the computer to improve mood which he enjoyed.    Therapy Documentation Precautions:  Precautions Precautions: Fall Precaution Comments: Parkinson's Restrictions Weight Bearing Restrictions: No General:     Therapy/Group: Individual Therapy  Destin Kittler P Floraine Buechler 03/17/2022, 7:28 AM

## 2022-03-17 NOTE — Progress Notes (Signed)
PROGRESS NOTE   Subjective/Complaints: No new complaints this morning Sleepy  ROS: +pain from sacral pressure injury, +lower extremity edema- not painful,  denies insomnia, +dry eyes   Objective:   No results found. No results for input(s): "WBC", "HGB", "HCT", "PLT" in the last 72 hours. Recent Labs    03/15/22 0931 03/16/22 0527  NA 141 143  K 3.9 3.8  CL 109 111  CO2 22 25  GLUCOSE 197* 167*  BUN 40* 43*  CREATININE 1.99* 1.85*  CALCIUM 8.4* 8.4*    Intake/Output Summary (Last 24 hours) at 03/17/2022 1041 Last data filed at 03/17/2022 0700 Gross per 24 hour  Intake 480 ml  Output --  Net 480 ml     Pressure Injury 02/25/22 Coccyx Unstageable - Full thickness tissue loss in which the base of the injury is covered by slough (yellow, tan, gray, green or brown) and/or eschar (tan, brown or black) in the wound bed. (Active)  02/25/22 2100  Location: Coccyx  Location Orientation:   Staging: Unstageable - Full thickness tissue loss in which the base of the injury is covered by slough (yellow, tan, gray, green or brown) and/or eschar (tan, brown or black) in the wound bed.  Wound Description (Comments):   Present on Admission: Yes    Physical Exam: Vital Signs Blood pressure 140/66, pulse 63, temperature 97.8 F (36.6 C), temperature source Oral, resp. rate 20, height '5\' 8"'$  (1.727 m), weight 77.1 kg, SpO2 100 %.  General: No acute distress, BMI 25.85, somnolent this morning Mood and affect are appropriate Heart: Bradycardia, no rubs murmurs or extra sounds Lungs: Clear to auscultation, breathing unlabored, no rales or wheezes Abdomen: Positive bowel sounds, soft nontender to palpation, nondistended Skin: unstagable to coccyx Neuro: Alert and Oriented x 4, follows commands, sensation intact to LT in all 4 extremities. Able to name and repeat. Speech appears fluent.  No pronator drift. Cranial nerves II through  XII intact other than tongue deviation to Rt. Bradykinesia Resting tremor more on RUE than LUE FTN slow intact b/l Strength 4/5 in b/l UE 3/5 proximal LE, 4/5 distal LE Musculoskeletal:  Increased tone/Rigidity at in b/l UE and LE practically at elbows and knees with decreased extension at b/l Knees No joint redness noted No joint tenderness elicited Significant lower extremity edema bilaterally Psych: depressed mood   Assessment/Plan: 1. Functional deficits which require 3+ hours per day of interdisciplinary therapy in a comprehensive inpatient rehab setting. Physiatrist is providing close team supervision and 24 hour management of active medical problems listed below. Physiatrist and rehab team continue to assess barriers to discharge/monitor patient progress toward functional and medical goals  Care Tool:  Bathing    Body parts bathed by patient: Right arm, Left arm, Chest, Abdomen, Front perineal area, Buttocks, Right upper leg, Left upper leg, Face   Body parts bathed by helper: Left lower leg, Right lower leg     Bathing assist Assist Level: Minimal Assistance - Patient > 75%     Upper Body Dressing/Undressing Upper body dressing   What is the patient wearing?: Pull over shirt    Upper body assist Assist Level: Supervision/Verbal cueing    Lower  Body Dressing/Undressing Lower body dressing      What is the patient wearing?: Pants, Incontinence brief     Lower body assist Assist for lower body dressing: Moderate Assistance - Patient 50 - 74%     Toileting Toileting Toileting Activity did not occur Landscape architect and hygiene only): N/A (no void or bm)  Toileting assist Assist for toileting: Minimal Assistance - Patient > 75% Assistive Device Comment: walker   Transfers Chair/bed transfer  Transfers assist     Chair/bed transfer assist level: Contact Guard/Touching assist     Locomotion Ambulation   Ambulation assist      Assist level:  Contact Guard/Touching assist Assistive device: Walker-rolling Max distance: 120'   Walk 10 feet activity   Assist     Assist level: Contact Guard/Touching assist Assistive device: Walker-rolling   Walk 50 feet activity   Assist    Assist level: Contact Guard/Touching assist Assistive device: Walker-rolling    Walk 150 feet activity   Assist    Assist level: Supervision/Verbal cueing Assistive device: Walker-rolling    Walk 10 feet on uneven surface  activity   Assist     Assist level: Minimal Assistance - Patient > 75% Assistive device: Walker-rolling   Wheelchair     Assist Is the patient using a wheelchair?: No             Wheelchair 50 feet with 2 turns activity    Assist            Wheelchair 150 feet activity     Assist          Blood pressure 140/66, pulse 63, temperature 97.8 F (36.6 C), temperature source Oral, resp. rate 20, height '5\' 8"'$  (1.727 m), weight 77.1 kg, SpO2 100 %.  Medical Problem List and Plan: 1. Functional deficits secondary to acute punctate infarct within the right cerebellar hemisphere, possible right frontal lobe infarct likely cardioembolic in the setting of paroxysmal atrial fibrillation             -patient may shower             -ELOS/Goals: 9-12 days             Continue CIR  Discussed with SW his preference for nursing home  Family ed and d/c tomorrow 2.  Antithrombotics: -DVT/anticoagulation:  Pharmaceutical: Other (comment)Eliquis 2.5 mg daily             -antiplatelet therapy: aspirin 81 mg daily 3. Pain from sacral pressure injury: ContinueTylenol to 1,'000mg'$  scheduled TID and air mattress ordered. Kpad ordered for muscles of lower back. Repeat CMP tomorrow 4. Depressed mood: chaplain consulted 5. Neuropsych/cognition: This patient is capable of making decisions on his own behalf. 6. Gluteal fold Stage 2 pressure injury, skin injury over coccyx. Air mattress ordered.  7.  Fluids/Electrolytes/Nutrition: Routine Is and Os and follow-up chemistries 8: Atrial fibrillation, paroxysmal:  -continue Lopressor 12.5 mg BID -continue Eliquis 9: Parkinson's disease: continue sinemet CR 10: DM-2: continue CBG monitoring and SSI             -was recently started on glipizide titrated up to 5 mg daily at home (not restarted) A1c = 7.9 on 6/9,   D/c meal coverage, d/c Ensure between meals CBG (last 3)  Recent Labs    03/16/22 1633 03/16/22 2053 03/17/22 0610  GLUCAP 86 230* 157*    11: Hypertension: continue to monitor, BP overall well controlled,sometimes a little soft             -  continue Lopressor Vitals:   03/17/22 0442 03/17/22 0830  BP: 119/72 140/66  Pulse: 69 63  Resp: 20   Temp: 97.8 F (36.6 C)   SpO2: 100%     12: CKD stage 3b: elevated BUN and creatinine: Baseline Cr 1.6 -2 follow-up chemistries and ensure adequate PO fluids. Cr improving, monitor weekly.    Latest Ref Rng & Units 03/16/2022    5:27 AM 03/15/2022    9:31 AM 03/14/2022   11:23 AM  BMP  Glucose 70 - 99 mg/dL 167  197  81   BUN 8 - 23 mg/dL 43  40  40   Creatinine 0.61 - 1.24 mg/dL 1.85  1.99  2.11   Sodium 135 - 145 mmol/L 143  141  141   Potassium 3.5 - 5.1 mmol/L 3.8  3.9  4.1   Chloride 98 - 111 mmol/L 111  109  110   CO2 22 - 32 mmol/L '25  22  25   '$ Calcium 8.9 - 10.3 mg/dL 8.4  8.4  8.3     13: Pulmonary plaques/LLL mass: follow-up as outpatient with pulmonology 14: Chronic pancreatic insufficiency, on creon 15: BPH: continue Flomax and monitor for retention             --has external urine collection system; voiding trial 16: Hypothyroidism: continue Synthroid 25 mcg daily 18: SBO: resolved; now on regular diet             -last BM 6/22 19: recent Zoster to face 20: Anemia: hgb stable, repeat Monday 21. RUL opacities, he was treated with ceftriaxone and flagyl 22. Lower extremity edema:Decrease lasix to '20mg'$  daily given AKI, placed nursing order to elevate, ice and  apply compression garments 23. Urinary retention: continue flomax to 0.'8mg'$       LOS: 8 days A FACE TO FACE EVALUATION WAS PERFORMED  Clide Deutscher Shervin Cypert 03/17/2022, 10:41 AM

## 2022-03-18 DIAGNOSIS — I63449 Cerebral infarction due to embolism of unspecified cerebellar artery: Secondary | ICD-10-CM

## 2022-03-18 LAB — GLUCOSE, CAPILLARY
Glucose-Capillary: 118 mg/dL — ABNORMAL HIGH (ref 70–99)
Glucose-Capillary: 247 mg/dL — ABNORMAL HIGH (ref 70–99)
Glucose-Capillary: 94 mg/dL (ref 70–99)
Glucose-Capillary: 154 mg/dL — ABNORMAL HIGH (ref 70–99)

## 2022-03-18 NOTE — Discharge Instructions (Signed)
Inpatient Rehab Discharge Instructions  Steven Ward Discharge date and time: 03/22/2022  Activities/Precautions/ Functional Status: Activity: no lifting, driving, or strenuous exercise until cleared by MD Diet: diabetic diet Wound Care: none needed Functional status:  ___ No restrictions     ___ Walk up steps independently ___ 24/7 supervision/assistance   ___ Walk up steps with assistance ___ Intermittent supervision/assistance  ___ Bathe/dress independently ___ Walk with walker     ___ Bathe/dress with assistance ___ Walk Independently    ___ Shower independently ___ Walk with assistance    ___ Shower with assistance ___ No alcohol     ___ Return to work/school ________  Special Instructions:  No driving, alcohol consumption or tobacco use.  Check fingerstick blood sugars 4 times daily and record. Bring this information to your primary care provider.  COMMUNITY REFERRALS UPON DISCHARGE:    Home Health:   PT & OT                  Agency: [facility]    Phone: [phone]    Medical Equipment/Items Ordered:ROLLING Dan Humphreys                                                 Agency/Supplier:[facility]   [phone]    STROKE/TIA DISCHARGE INSTRUCTIONS SMOKING Cigarette smoking nearly doubles your risk of having a stroke & is the single most alterable risk factor  If you smoke or have smoked in the last 12 months, you are advised to quit smoking for your health. Most of the excess cardiovascular risk related to smoking disappears within a year of stopping. Ask you doctor about anti-smoking medications [place] Quit Line: 1-800-QUIT NOW Free Smoking Cessation Classes [phone]  CHOLESTEROL Know your levels; limit fat & cholesterol in your diet  Lipid Panel     Component Value Date/Time   CHOL 76 02/23/2022 0305   TRIG 66 03/02/2022 0530   HDL 31 (L) 02/23/2022 0305   CHOLHDL 2.5 02/23/2022 0305   VLDL 11 02/23/2022 0305   LDLCALC 34 02/23/2022 0305     Many  patients benefit from treatment even if their cholesterol is at goal. Goal: Total Cholesterol (CHOL) less than 160 Goal:  Triglycerides (TRIG) less than 150 Goal:  HDL greater than 40 Goal:  LDL (LDLCALC) less than 100   BLOOD PRESSURE American Stroke Association blood pressure target is less that 120/80 mm/Hg  Your discharge blood pressure is:  BP: 133/61 Monitor your blood pressure Limit your salt and alcohol intake Many individuals will require more than one medication for high blood pressure  DIABETES (A1c is a blood sugar average for last 3 months) Goal HGBA1c is under 7% (HBGA1c is blood sugar average for last 3 months)  Diabetes: Diagnosis of diabetes:  Your A1c:7.9 %    Lab Results  Component Value Date   HGBA1C 7.9 (H) 02/24/2022    Your HGBA1c can be lowered with medications, healthy diet, and exercise. Check your blood sugar as directed by your physician Call your physician if you experience unexplained or low blood sugars.  PHYSICAL ACTIVITY/REHABILITATION Goal is 30 minutes at least 4 days per week  Activity: Increase activity slowly, Therapies: Physical Therapy: Home Health, Occupational Therapy: Home Health, and Speech Therapy: Home Health Return to work: n/a Activity decreases your risk of heart attack and stroke and makes  your heart stronger.  It helps control your weight and blood pressure; helps you relax and can improve your mood. Participate in a regular exercise program. Talk with your doctor about the best form of exercise for you (dancing, walking, swimming, cycling).  DIET/WEIGHT Goal is to maintain a healthy weight  Your discharge diet is:  Diet Order             Diet Carb Modified Fluid consistency: Thin; Room service appropriate? Yes  Diet effective now                  thin liquids Your height is:  Height: 5\' 8"  (172.7 cm) Your current weight is: Weight: 77.1 kg Your Body Mass Index (BMI) is:  BMI (Calculated): 25.85 Following the type of diet  specifically designed for you will help prevent another stroke. Your goal weight range is:   Your goal Body Mass Index (BMI) is 19-24. Healthy food habits can help reduce 3 risk factors for stroke:  High cholesterol, hypertension, and excess weight.  RESOURCES Stroke/Support Group:  Call [phone]   STROKE EDUCATION PROVIDED/REVIEWED AND GIVEN TO PATIENT Stroke warning signs and symptoms How to activate emergency medical system (call 911). Medications prescribed at discharge. Need for follow-up after discharge. Personal risk factors for stroke. Pneumonia vaccine given: No Flu vaccine given: No My questions have been answered, the writing is legible, and I understand these instructions.  I will adhere to these goals & educational materials that have been provided to me after my discharge from the hospital.      My questions have been answered and I understand these instructions. I will adhere to these goals and the provided educational materials after my discharge from the hospital.  Patient/Caregiver Signature _______________________________ Date __________  Clinician Signature _______________________________________ Date __________  Please bring this form and your medication list with you to all your follow-up doctor's appointments.         Information on my medicine - ELIQUIS (apixaban)  This medication education was reviewed with me or my healthcare representative as part of my discharge preparation.    Why was Eliquis prescribed for you? Eliquis was prescribed for you to reduce the risk of a blood clot forming that can cause a stroke if you have a medical condition called atrial fibrillation (a type of irregular heartbeat).  What do You need to know about Eliquis ? Take your Eliquis TWICE DAILY - one tablet in the morning and one tablet in the evening with or without food. If you have difficulty swallowing the tablet whole please discuss with your pharmacist how to  take the medication safely.  Take Eliquis exactly as prescribed by your doctor and DO NOT stop taking Eliquis without talking to the doctor who prescribed the medication.  Stopping may increase your risk of developing a stroke.  Refill your prescription before you run out.  After discharge, you should have regular check-up appointments with your healthcare provider that is prescribing your Eliquis.  In the future your dose may need to be changed if your kidney function or weight changes by a significant amount or as you get older.  What do you do if you miss a dose? If you miss a dose, take it as soon as you remember on the same day and resume taking twice daily.  Do not take more than one dose of ELIQUIS at the same time to make up a missed dose.  Important Safety Information A possible side effect of Eliquis  is bleeding. You should call your healthcare provider right away if you experience any of the following: Bleeding from an injury or your nose that does not stop. Unusual colored urine (red or dark brown) or unusual colored stools (red or black). Unusual bruising for unknown reasons. A serious fall or if you hit your head (even if there is no bleeding).  Some medicines may interact with Eliquis and might increase your risk of bleeding or clotting while on Eliquis. To help avoid this, consult your healthcare provider or pharmacist prior to using any new prescription or non-prescription medications, including herbals, vitamins, non-steroidal anti-inflammatory drugs (NSAIDs) and supplements.  This website has more information on Eliquis (apixaban): http://www.eliquis.com/eliquis/home  ============================================  Atrial Fibrillation    Atrial fibrillation is a type of heartbeat that is irregular or fast. If you have this condition, your heart beats without any order. This makes it hard for your heart to pump blood in a normal way. Atrial fibrillation may come and  go, or it may become a long-lasting problem. If this condition is not treated, it can put you at higher risk for stroke, heart failure, and other heart problems.  What are the causes? This condition may be caused by diseases that damage the heart. They include: High blood pressure. Heart failure. Heart valve disease. Heart surgery. Other causes include: Diabetes. Thyroid disease. Being overweight. Kidney disease. Sometimes the cause is not known.  What increases the risk? You are more likely to develop this condition if: You are older. You smoke. You exercise often and very hard. You have a family history of this condition. You are a man. You use drugs. You drink a lot of alcohol. You have lung conditions, such as emphysema, pneumonia, or COPD. You have sleep apnea.  What are the signs or symptoms? Common symptoms of this condition include: A feeling that your heart is beating very fast. Chest pain or discomfort. Feeling short of breath. Suddenly feeling light-headed or weak. Getting tired easily during activity. Fainting. Sweating. In some cases, there are no symptoms.  How is this treated? Treatment for this condition depends on underlying conditions and how you feel when you have atrial fibrillation. They include: Medicines to: Prevent blood clots. Treat heart rate or heart rhythm problems. Using devices, such as a pacemaker, to correct heart rhythm problems. Doing surgery to remove the part of the heart that sends bad signals. Closing an area where clots can form in the heart (left atrial appendage). In some cases, your doctor will treat other underlying conditions.  Follow these instructions at home:  Medicines Take over-the-counter and prescription medicines only as told by your doctor. Do not take any new medicines without first talking to your doctor. If you are taking blood thinners: Talk with your doctor before you take any medicines that have aspirin  or NSAIDs, such as ibuprofen, in them. Take your medicine exactly as told by your doctor. Take it at the same time each day. Avoid activities that could hurt or bruise you. Follow instructions about how to prevent falls. Wear a bracelet that says you are taking blood thinners. Or, carry a card that lists what medicines you take. Lifestyle         Do not use any products that have nicotine or tobacco in them. These include cigarettes, e-cigarettes, and chewing tobacco. If you need help quitting, ask your doctor. Eat heart-healthy foods. Talk with your doctor about the right eating plan for you. Exercise regularly as told by your  doctor. Do not drink alcohol. Lose weight if you are overweight. Do not use drugs, including cannabis.  General instructions If you have a condition that causes breathing to stop for a short period of time (apnea), treat it as told by your doctor. Keep a healthy weight. Do not use diet pills unless your doctor says they are safe for you. Diet pills may make heart problems worse. Keep all follow-up visits as told by your doctor. This is important.  Contact a doctor if: You notice a change in the speed, rhythm, or strength of your heartbeat. You are taking a blood-thinning medicine and you get more bruising. You get tired more easily when you move or exercise. You have a sudden change in weight.  Get help right away if:    You have pain in your chest or your belly (abdomen). You have trouble breathing. You have side effects of blood thinners, such as blood in your vomit, poop (stool), or pee (urine), or bleeding that cannot stop. You have any signs of a stroke. "BE FAST" is an easy way to remember the main warning signs: B - Balance. Signs are dizziness, sudden trouble walking, or loss of balance. E - Eyes. Signs are trouble seeing or a change in how you see. F - Face. Signs are sudden weakness or loss of feeling in the face, or the face or eyelid drooping  on one side. A - Arms. Signs are weakness or loss of feeling in an arm. This happens suddenly and usually on one side of the body. S - Speech. Signs are sudden trouble speaking, slurred speech, or trouble understanding what people say. T - Time. Time to call emergency services. Write down what time symptoms started. You have other signs of a stroke, such as: A sudden, very bad headache with no known cause. Feeling like you may vomit (nausea). Vomiting. A seizure.  These symptoms may be an emergency. Do not wait to see if the symptoms will go away. Get medical help right away. Call your local emergency services (911 in the [place] Do not drive yourself to the hospital. Summary Atrial fibrillation is a type of heartbeat that is irregular or fast. You are at higher risk of this condition if you smoke, are older, have diabetes, or are overweight. Follow your doctor's instructions about medicines, diet, exercise, and follow-up visits. Get help right away if you have signs or symptoms of a stroke. Get help right away if you cannot catch your breath, or you have chest pain or discomfort. This information is not intended to replace advice given to you by your health care provider. Make sure you discuss any questions you have with your health care provider. Document Revised: 02/26/2019 Document Reviewed: 02/26/2019 Elsevier Patient Education  2020 [employer].

## 2022-03-18 NOTE — Progress Notes (Signed)
Occupational Therapy Session Note  Patient Details  Name: ANSEN SAYEGH MRN: [id number] Date of Birth: [birth year]  Today's Date: 03/18/2022 OT Individual Time: 0100-7121 OT Individual Time Calculation (min): 60 min    Short Term Goals: Week 1:  OT Short Term Goal 1 (Week 1): STGS=LTGs due to ELOS  Skilled Therapeutic Interventions/Progress Updates:    Upon OT arrival, pt seated in w/c and reports 7/10 pain in lower back but declines intervention. Pt agreeable to OT treatment session and requests to get cleaned up. Pt completes sponge bath ADL at the levels listed below. Increased time required. Use of reacher and sock aid required to assist with dressing tasks. Pt completes functional mobility with RW and CGA to and from bathroom two times during session to complete toileting. Pt requires encouragement to attempt to complete portions of ADL without assistance as pt often states "I can't do it". Pt was left in w/c with all safety measures in place and needs met.   Therapy Documentation Precautions:  Precautions Precautions: Fall Precaution Comments: Parkinson's Restrictions Weight Bearing Restrictions: No ADL: Grooming: Supervision/safety Where Assessed-Grooming: Sitting at sink Upper Body Bathing: Supervision/safety Where Assessed-Upper Body Bathing: Sitting at sink Lower Body Bathing: Contact guard Where Assessed-Lower Body Bathing: Sitting at sink Upper Body Dressing: Supervision/safety Where Assessed-Upper Body Dressing: Sitting at sink Lower Body Dressing: Moderate assistance Where Assessed-Lower Body Dressing: Wheelchair Toileting: Moderate assistance Where Assessed-Toileting: Glass blower/designer: Psychiatric nurse Method: Counselling psychologist: Grab bars   Therapy/Group: Individual Therapy  Tameria Patti 03/18/2022, 11:04 AM

## 2022-03-18 NOTE — Progress Notes (Signed)
PROGRESS NOTE   Subjective/Complaints: Pt reports back hurting- is chronic Needs to pee- asked nursing to see him to see if could void.   LBM yesterday Ate 50% of breakfast tray.    ROS:  Pt denies SOB, abd pain, CP, N/V/C/D, and vision changes   Objective:   No results found. No results for input(s): "WBC", "HGB", "HCT", "PLT" in the last 72 hours. Recent Labs    03/16/22 0527  NA 143  K 3.8  CL 111  CO2 25  GLUCOSE 167*  BUN 43*  CREATININE 1.85*  CALCIUM 8.4*    Intake/Output Summary (Last 24 hours) at 03/18/2022 1044 Last data filed at 03/17/2022 2255 Gross per 24 hour  Intake 180 ml  Output 402 ml  Net -222 ml     Pressure Injury 02/25/22 Coccyx Unstageable - Full thickness tissue loss in which the base of the injury is covered by slough (yellow, tan, gray, green or brown) and/or eschar (tan, brown or black) in the wound bed. (Active)  02/25/22 2100  Location: Coccyx  Location Orientation:   Staging: Unstageable - Full thickness tissue loss in which the base of the injury is covered by slough (yellow, tan, gray, green or brown) and/or eschar (tan, brown or black) in the wound bed.  Wound Description (Comments):   Present on Admission: Yes    Physical Exam: Vital Signs Blood pressure 121/63, pulse 78, temperature 97.9 F (36.6 C), temperature source Oral, resp. rate (!) 22, height '5\' 8"'$  (1.727 m), weight 77.1 kg, SpO2 98 %.    General: awake, alert, appropriate, supine in bed; flat affect; NAD HENT: conjugate gaze; oropharynx moist CV: regular rate; no JVD Pulmonary: CTA B/L; no W/R/R- good air movement GI: soft, NT, ND, (+)BS Psychiatric: appropriate- slightly flat Neurological: sleepy, but appropriate Skin: unstagable to coccyx Neuro: Alert and Oriented x 4, follows commands, sensation intact to LT in all 4 extremities. Able to name and repeat. Speech appears fluent.  No pronator drift. Cranial  nerves II through XII intact other than tongue deviation to Rt. Bradykinesia Resting tremor more on RUE than LUE- still notable FTN slow intact b/l Strength 4/5 in b/l UE 3/5 proximal LE, 4/5 distal LE Musculoskeletal:  Increased tone/Rigidity at in b/l UE and LE practically at elbows and knees with decreased extension at b/l Knees No joint redness noted No joint tenderness elicited Significant lower extremity edema bilaterally Psych: depressed mood   Assessment/Plan: 1. Functional deficits which require 3+ hours per day of interdisciplinary therapy in a comprehensive inpatient rehab setting. Physiatrist is providing close team supervision and 24 hour management of active medical problems listed below. Physiatrist and rehab team continue to assess barriers to discharge/monitor patient progress toward functional and medical goals  Care Tool:  Bathing    Body parts bathed by patient: Right arm, Left arm, Chest, Abdomen, Front perineal area, Buttocks, Right upper leg, Left upper leg, Face, Right lower leg, Left lower leg   Body parts bathed by helper: Left lower leg, Right lower leg     Bathing assist Assist Level: Contact Guard/Touching assist     Upper Body Dressing/Undressing Upper body dressing   What is the  patient wearing?: Pull over shirt    Upper body assist Assist Level: Supervision/Verbal cueing    Lower Body Dressing/Undressing Lower body dressing      What is the patient wearing?: Pants, Incontinence brief     Lower body assist Assist for lower body dressing: Moderate Assistance - Patient 50 - 74%     Toileting Toileting Toileting Activity did not occur (Clothing management and hygiene only): N/A (no void or bm)  Toileting assist Assist for toileting: Moderate Assistance - Patient 50 - 74% Assistive Device Comment: walker   Transfers Chair/bed transfer  Transfers assist     Chair/bed transfer assist level: Contact Guard/Touching assist      Locomotion Ambulation   Ambulation assist      Assist level: Contact Guard/Touching assist Assistive device: Walker-rolling Max distance: 120'   Walk 10 feet activity   Assist     Assist level: Contact Guard/Touching assist Assistive device: Walker-rolling   Walk 50 feet activity   Assist    Assist level: Contact Guard/Touching assist Assistive device: Walker-rolling    Walk 150 feet activity   Assist    Assist level: Supervision/Verbal cueing Assistive device: Walker-rolling    Walk 10 feet on uneven surface  activity   Assist     Assist level: Minimal Assistance - Patient > 75% Assistive device: Walker-rolling   Wheelchair     Assist Is the patient using a wheelchair?: No             Wheelchair 50 feet with 2 turns activity    Assist            Wheelchair 150 feet activity     Assist          Blood pressure 121/63, pulse 78, temperature 97.9 F (36.6 C), temperature source Oral, resp. rate (!) 22, height '5\' 8"'$  (1.727 m), weight 77.1 kg, SpO2 98 %.  Medical Problem List and Plan: 1. Functional deficits secondary to acute punctate infarct within the right cerebellar hemisphere, possible right frontal lobe infarct likely cardioembolic in the setting of paroxysmal atrial fibrillation             -patient may shower             -ELOS/Goals: 9-12 days             Continue CIR  Discussed with SW his preference for nursing home  Con't CIR- PT, OT and SLP- d/c 7/5 per chart 2.  Antithrombotics: -DVT/anticoagulation:  Pharmaceutical: Other (comment)Eliquis 2.5 mg daily             -antiplatelet therapy: aspirin 81 mg daily 3. Pain from sacral pressure injury: ContinueTylenol to 1,'000mg'$  scheduled TID and air mattress ordered. Kpad ordered for muscles of lower back. Repeat CMP tomorrow 4. Depressed mood: chaplain consulted 5. Neuropsych/cognition: This patient is capable of making decisions on his own behalf. 6. Gluteal fold  Stage 2 pressure injury, unstageable skin injury over coccyx. Air mattress ordered. 7/1- encourage pt to turn q2 hours  7. Fluids/Electrolytes/Nutrition: Routine Is and Os and follow-up chemistries 8: Atrial fibrillation, paroxysmal:  -continue Lopressor 12.5 mg BID -continue Eliquis 9: Parkinson's disease: continue sinemet CR 10: DM-2: continue CBG monitoring and SSI             -was recently started on glipizide titrated up to 5 mg daily at home (not restarted) A1c = 7.9 on 6/9,   D/c meal coverage, d/c Ensure between meals  7/1- BBG's look good- con't regimen  CBG (last 3)  Recent Labs    03/17/22 1658 03/17/22 2046 03/18/22 0600  GLUCAP 132* 157* 118*    11: Hypertension: continue to monitor, BP overall well controlled,sometimes a little soft             -continue Lopressor  7/1- BP controlled to slightly high in last 24 hours- since had been soft, will monitor trend Vitals:   03/18/22 0416 03/18/22 0832  BP: (!) 144/79 121/63  Pulse: 78 78  Resp: (!) 22   Temp: 97.9 F (36.6 C)   SpO2: 98%     12: CKD stage 3b: elevated BUN and creatinine: Baseline Cr 1.6 -2 follow-up chemistries and ensure adequate PO fluids. Cr improving, monitor weekly.    Latest Ref Rng & Units 03/16/2022    5:27 AM 03/15/2022    9:31 AM 03/14/2022   11:23 AM  BMP  Glucose 70 - 99 mg/dL 167  197  81   BUN 8 - 23 mg/dL 43  40  40   Creatinine 0.61 - 1.24 mg/dL 1.85  1.99  2.11   Sodium 135 - 145 mmol/L 143  141  141   Potassium 3.5 - 5.1 mmol/L 3.8  3.9  4.1   Chloride 98 - 111 mmol/L 111  109  110   CO2 22 - 32 mmol/L '25  22  25   '$ Calcium 8.9 - 10.3 mg/dL 8.4  8.4  8.3     13: Pulmonary plaques/LLL mass: follow-up as outpatient with pulmonology 14: Chronic pancreatic insufficiency, on creon 15: BPH: continue Flomax and monitor for retention             --has external urine collection system; voiding trial 16: Hypothyroidism: continue Synthroid 25 mcg daily 18: SBO: resolved; now on regular  diet             -last BM 6/22 19: recent Zoster to face 20: Anemia: hgb stable, repeat Monday 21. RUL opacities, he was treated with ceftriaxone and flagyl 22. Lower extremity edema:Decrease lasix to '20mg'$  daily given AKI, placed nursing order to elevate, ice and apply compression garments 23. Urinary retention: continue flomax to 0.'8mg'$   7/1- still requiring in/out caths- on max dose of Flomax- con't regimen   I spent a total of 35   minutes on total care today- >50% coordination of care- due to d/w nursing about d/c plan- since wa sin chart was leaving today  LOS: 9 days A FACE TO FACE EVALUATION WAS PERFORMED  Otillia Cordone 03/18/2022, 10:44 AM

## 2022-03-18 NOTE — Plan of Care (Signed)
  Problem: Consults Goal: RH STROKE PATIENT EDUCATION Description: See Patient Education module for education specifics  Outcome: Progressing   Problem: RH BLADDER ELIMINATION Goal: RH STG MANAGE BLADDER WITH ASSISTANCE Description: STG Manage Bladder With toileting Assistance Outcome: Progressing Goal: RH STG MANAGE BLADDER WITH MEDICATION WITH ASSISTANCE Description: STG Manage Bladder With Medication With mod I Assistance. Outcome: Progressing   Problem: RH SKIN INTEGRITY Goal: RH STG MAINTAIN SKIN INTEGRITY WITH ASSISTANCE Description: STG Maintain Skin Integrity With min Assistance. Outcome: Progressing Goal: RH STG ABLE TO PERFORM INCISION/WOUND CARE W/ASSISTANCE Description: STG Able To Perform Incision/Wound Care With min  Assistance. Outcome: Progressing   Problem: RH SAFETY Goal: RH STG ADHERE TO SAFETY PRECAUTIONS W/ASSISTANCE/DEVICE Description: STG Adhere to Safety Precautions With cues Assistance/Device. Outcome: Progressing   Problem: RH KNOWLEDGE DEFICIT Goal: RH STG INCREASE KNOWLEDGE OF DIABETES Description: Patient and son will be able to manage DM with medications and dietary modifications using handouts and educational resources independently Outcome: Progressing Goal: RH STG INCREASE KNOWLEDGE OF HYPERTENSION Description: Patient and son will be able to manage HTN with medications and dietary modifications using handouts and educational resources independently Outcome: Progressing Goal: RH STG INCREASE KNOWLEGDE OF HYPERLIPIDEMIA Description: Patient and son will be able to manage HLD with medications and dietary modifications using handouts and educational resources independently Outcome: Progressing Goal: RH STG INCREASE KNOWLEDGE OF STROKE PROPHYLAXIS Description: Patient and son will be able to manage secondary stroke risks with medications and dietary modifications using handouts and educational resources independently Outcome: Progressing   Problem:  Education: Goal: Ability to describe self-care measures that may prevent or decrease complications (Diabetes Survival Skills Education) will improve Outcome: Progressing Goal: Individualized Educational Video(s) Outcome: Progressing   Problem: Coping: Goal: Ability to adjust to condition or change in health will improve Outcome: Progressing   Problem: Fluid Volume: Goal: Ability to maintain a balanced intake and output will improve Outcome: Progressing   Problem: Health Behavior/Discharge Planning: Goal: Ability to identify and utilize available resources and services will improve Outcome: Progressing Goal: Ability to manage health-related needs will improve Outcome: Progressing   Problem: Metabolic: Goal: Ability to maintain appropriate glucose levels will improve Outcome: Progressing   Problem: Nutritional: Goal: Maintenance of adequate nutrition will improve Outcome: Progressing Goal: Progress toward achieving an optimal weight will improve Outcome: Progressing   Problem: Skin Integrity: Goal: Risk for impaired skin integrity will decrease Outcome: Progressing   Problem: Tissue Perfusion: Goal: Adequacy of tissue perfusion will improve Outcome: Progressing

## 2022-03-19 LAB — GLUCOSE, CAPILLARY
Glucose-Capillary: 145 mg/dL — ABNORMAL HIGH (ref 70–99)
Glucose-Capillary: 138 mg/dL — ABNORMAL HIGH (ref 70–99)
Glucose-Capillary: 136 mg/dL — ABNORMAL HIGH (ref 70–99)
Glucose-Capillary: 106 mg/dL — ABNORMAL HIGH (ref 70–99)

## 2022-03-19 MED ORDER — POLYVINYL ALCOHOL 1.4 % OP SOLN
2.0000 [drp] | OPHTHALMIC | Status: DC | PRN
Start: 2022-03-19 — End: 2022-03-22
  Filled 2022-03-19: qty 15

## 2022-03-19 NOTE — Plan of Care (Signed)
  Problem: Consults Goal: RH STROKE PATIENT EDUCATION Description: See Patient Education module for education specifics  Outcome: Progressing   Problem: RH BLADDER ELIMINATION Goal: RH STG MANAGE BLADDER WITH ASSISTANCE Description: STG Manage Bladder With toileting Assistance Outcome: Progressing Goal: RH STG MANAGE BLADDER WITH MEDICATION WITH ASSISTANCE Description: STG Manage Bladder With Medication With mod I Assistance. Outcome: Progressing   Problem: RH SKIN INTEGRITY Goal: RH STG MAINTAIN SKIN INTEGRITY WITH ASSISTANCE Description: STG Maintain Skin Integrity With min Assistance. Outcome: Progressing Goal: RH STG ABLE TO PERFORM INCISION/WOUND CARE W/ASSISTANCE Description: STG Able To Perform Incision/Wound Care With min  Assistance. Outcome: Progressing   Problem: RH SAFETY Goal: RH STG ADHERE TO SAFETY PRECAUTIONS W/ASSISTANCE/DEVICE Description: STG Adhere to Safety Precautions With cues Assistance/Device. Outcome: Progressing   Problem: RH KNOWLEDGE DEFICIT Goal: RH STG INCREASE KNOWLEDGE OF DIABETES Description: Patient and son will be able to manage DM with medications and dietary modifications using handouts and educational resources independently Outcome: Progressing Goal: RH STG INCREASE KNOWLEDGE OF HYPERTENSION Description: Patient and son will be able to manage HTN with medications and dietary modifications using handouts and educational resources independently Outcome: Progressing Goal: RH STG INCREASE KNOWLEGDE OF HYPERLIPIDEMIA Description: Patient and son will be able to manage HLD with medications and dietary modifications using handouts and educational resources independently Outcome: Progressing Goal: RH STG INCREASE KNOWLEDGE OF STROKE PROPHYLAXIS Description: Patient and son will be able to manage secondary stroke risks with medications and dietary modifications using handouts and educational resources independently Outcome: Progressing   Problem:  Education: Goal: Ability to describe self-care measures that may prevent or decrease complications (Diabetes Survival Skills Education) will improve Outcome: Progressing Goal: Individualized Educational Video(s) Outcome: Progressing   Problem: Coping: Goal: Ability to adjust to condition or change in health will improve Outcome: Progressing   Problem: Fluid Volume: Goal: Ability to maintain a balanced intake and output will improve Outcome: Progressing   Problem: Health Behavior/Discharge Planning: Goal: Ability to identify and utilize available resources and services will improve Outcome: Progressing Goal: Ability to manage health-related needs will improve Outcome: Progressing   Problem: Metabolic: Goal: Ability to maintain appropriate glucose levels will improve Outcome: Progressing   Problem: Nutritional: Goal: Maintenance of adequate nutrition will improve Outcome: Progressing Goal: Progress toward achieving an optimal weight will improve Outcome: Progressing   Problem: Skin Integrity: Goal: Risk for impaired skin integrity will decrease Outcome: Progressing   Problem: Tissue Perfusion: Goal: Adequacy of tissue perfusion will improve Outcome: Progressing

## 2022-03-19 NOTE — Progress Notes (Signed)
Patient's son called and informed nurse of the many concerns that they had concerning patient not pushing himself. Son felt patient is giving up and not participating in therapy or care. Son is requesting an appointment with neuropsychology for patient. Informed patient that son has called. Patient stated that he does not want to go home with son and asked if anyone was her that he can talk to about this. Informed patient that the social worker will be in this week. Sanda Linger, LPN

## 2022-03-19 NOTE — Progress Notes (Signed)
PROGRESS NOTE   Subjective/Complaints:  Steven Ward reports thought was scheduled to leave today and unhappy about this.  Explained it was a miscommunication and apologized.   Also c/o dry eyes.    ROS:   Steven Ward denies SOB, abd pain, CP, N/V/C/D, and vision changes   Objective:   No results found. No results for input(s): "WBC", "HGB", "HCT", "PLT" in the last 72 hours. No results for input(s): "NA", "K", "CL", "CO2", "GLUCOSE", "BUN", "CREATININE", "CALCIUM" in the last 72 hours.   Intake/Output Summary (Last 24 hours) at 03/19/2022 0830 Last data filed at 03/19/2022 0746 Gross per 24 hour  Intake 716 ml  Output 475 ml  Net 241 ml     Pressure Injury 02/25/22 Coccyx Unstageable - Full thickness tissue loss in which the base of the injury is covered by slough (yellow, tan, gray, green or brown) and/or eschar (tan, brown or black) in the wound bed. (Active)  02/25/22 2100  Location: Coccyx  Location Orientation:   Staging: Unstageable - Full thickness tissue loss in which the base of the injury is covered by slough (yellow, tan, gray, green or brown) and/or eschar (tan, brown or black) in the wound bed.  Wound Description (Comments):   Present on Admission: Yes    Physical Exam: Vital Signs Blood pressure 125/78, pulse 69, temperature 98.3 F (36.8 C), temperature source Oral, resp. rate 18, height '5\' 8"'$  (1.727 m), weight 77.1 kg, SpO2 97 %.     General: awake, alert, appropriate, sitting up in bed; NAD HENT: conjugate gaze; oropharynx moist- dry eyes CV: regular rate; no JVD Pulmonary: CTA B/L; no W/R/R- good air movement GI: soft, NT, ND, (+)BS Psychiatric: appropriate- but upset thinking was leaving today-  Neurological: Ox3  Skin: unstagable to coccyx Neuro: Alert and Oriented x 4, follows commands, sensation intact to LT in all 4 extremities. Able to name and repeat. Speech appears fluent.  No pronator drift. Cranial  nerves II through XII intact other than tongue deviation to Rt. Bradykinesia Resting tremor more on RUE than LUE- still notable FTN slow intact b/l Strength 4/5 in b/l UE 3/5 proximal LE, 4/5 distal LE Musculoskeletal:  Increased tone/Rigidity at in b/l UE and LE practically at elbows and knees with decreased extension at b/l Knees No joint redness noted No joint tenderness elicited Significant lower extremity edema bilaterally Psych: depressed mood   Assessment/Plan: 1. Functional deficits which require 3+ hours per day of interdisciplinary therapy in a comprehensive inpatient rehab setting. Physiatrist is providing close team supervision and 24 hour management of active medical problems listed below. Physiatrist and rehab team continue to assess barriers to discharge/monitor patient progress toward functional and medical goals  Care Tool:  Bathing    Body parts bathed by patient: Right arm, Left arm, Chest, Abdomen, Front perineal area, Buttocks, Right upper leg, Left upper leg, Face, Right lower leg, Left lower leg   Body parts bathed by helper: Left lower leg, Right lower leg     Bathing assist Assist Level: Contact Guard/Touching assist     Upper Body Dressing/Undressing Upper body dressing   What is the patient wearing?: Pull over shirt    Upper body  assist Assist Level: Supervision/Verbal cueing    Lower Body Dressing/Undressing Lower body dressing      What is the patient wearing?: Pants, Incontinence brief     Lower body assist Assist for lower body dressing: Moderate Assistance - Patient 50 - 74%     Toileting Toileting Toileting Activity did not occur (Clothing management and hygiene only): N/A (no void or bm)  Toileting assist Assist for toileting: Moderate Assistance - Patient 50 - 74% Assistive Device Comment: walker   Transfers Chair/bed transfer  Transfers assist     Chair/bed transfer assist level: Contact Guard/Touching assist      Locomotion Ambulation   Ambulation assist      Assist level: Contact Guard/Touching assist Assistive device: Walker-rolling Max distance: 120'   Walk 10 feet activity   Assist     Assist level: Contact Guard/Touching assist Assistive device: Walker-rolling   Walk 50 feet activity   Assist    Assist level: Contact Guard/Touching assist Assistive device: Walker-rolling    Walk 150 feet activity   Assist    Assist level: Supervision/Verbal cueing Assistive device: Walker-rolling    Walk 10 feet on uneven surface  activity   Assist     Assist level: Minimal Assistance - Patient > 75% Assistive device: Walker-rolling   Wheelchair     Assist Is the patient using a wheelchair?: No             Wheelchair 50 feet with 2 turns activity    Assist            Wheelchair 150 feet activity     Assist          Blood pressure 125/78, pulse 69, temperature 98.3 F (36.8 C), temperature source Oral, resp. rate 18, height '5\' 8"'$  (1.727 m), weight 77.1 kg, SpO2 97 %.  Medical Problem List and Plan: 1. Functional deficits secondary to acute punctate infarct within the right cerebellar hemisphere, possible right frontal lobe infarct likely cardioembolic in the setting of paroxysmal atrial fibrillation             -patient may shower             -ELOS/Goals: 9-12 days             Continue CIR  Discussed with SW his preference for nursing home  Con't Steven Ward- OT and SLP- CIR  D/c scheduled for 7/5 2.  Antithrombotics: -DVT/anticoagulation:  Pharmaceutical: Other (comment)Eliquis 2.5 mg daily             -antiplatelet therapy: aspirin 81 mg daily 3. Pain from sacral pressure injury: ContinueTylenol to 1,'000mg'$  scheduled TID and air mattress ordered. Kpad ordered for muscles of lower back. Repeat CMP tomorrow 4. Depressed mood: chaplain consulted 5. Neuropsych/cognition: This patient is capable of making decisions on his own behalf. 6. Gluteal fold  Stage 2 pressure injury, unstageable skin injury over coccyx. Air mattress ordered. 7/1- encourage Steven Ward to turn q2 hours  7. Fluids/Electrolytes/Nutrition: Routine Is and Os and follow-up chemistries 8: Atrial fibrillation, paroxysmal:  -continue Lopressor 12.5 mg BID -continue Eliquis 9: Parkinson's disease: continue sinemet CR 10: DM-2: continue CBG monitoring and SSI             -was recently started on glipizide titrated up to 5 mg daily at home (not restarted) A1c = 7.9 on 6/9,   D/c meal coverage, d/c Ensure between meals  7/2- CBG's look good- con't regimen CBG (last 3)  Recent Labs    03/18/22  1614 03/18/22 2101 03/19/22 0600  GLUCAP 94 154* 145*    11: Hypertension: continue to monitor, BP overall well controlled,sometimes a little soft             -continue Lopressor  7/2- BP slightly high to normal- con't regimen Vitals:   03/19/22 0436 03/19/22 0824  BP: (!) 142/73 125/78  Pulse: 72 69  Resp: 18   Temp: 98.3 F (36.8 C)   SpO2: 97%     12: CKD stage 3b: elevated BUN and creatinine: Baseline Cr 1.6 -2 follow-up chemistries and ensure adequate PO fluids. Cr improving, monitor weekly.    Latest Ref Rng & Units 03/16/2022    5:27 AM 03/15/2022    9:31 AM 03/14/2022   11:23 AM  BMP  Glucose 70 - 99 mg/dL 167  197  81   BUN 8 - 23 mg/dL 43  40  40   Creatinine 0.61 - 1.24 mg/dL 1.85  1.99  2.11   Sodium 135 - 145 mmol/L 143  141  141   Potassium 3.5 - 5.1 mmol/L 3.8  3.9  4.1   Chloride 98 - 111 mmol/L 111  109  110   CO2 22 - 32 mmol/L '25  22  25   '$ Calcium 8.9 - 10.3 mg/dL 8.4  8.4  8.3     13: Pulmonary plaques/LLL mass: follow-up as outpatient with pulmonology 14: Chronic pancreatic insufficiency, on creon 15: BPH: continue Flomax and monitor for retention             --has external urine collection system; voiding trial 16: Hypothyroidism: continue Synthroid 25 mcg daily 18: SBO: resolved; now on regular diet             -last BM 6/22 19: recent Zoster to  face 20: Anemia: hgb stable, repeat Monday 21. RUL opacities, he was treated with ceftriaxone and flagyl 22. Lower extremity edema:Decrease lasix to '20mg'$  daily given AKI, placed nursing order to elevate, ice and apply compression garments 23. Urinary retention: continue flomax to 0.'8mg'$   7/1- still requiring in/out caths- on max dose of Flomax- con't regimen   I spent a total of 38   minutes on total care today- >50% coordination of care- due to discussing and correcting miscommunication that Steven Ward was leaving today- he is not.  Will likely need cath education since still requiring in/out caths.     LOS: 10 days A FACE TO FACE EVALUATION WAS PERFORMED  Elizbeth Posa 03/19/2022, 8:30 AM

## 2022-03-20 DIAGNOSIS — J3489 Other specified disorders of nose and nasal sinuses: Secondary | ICD-10-CM

## 2022-03-20 LAB — BASIC METABOLIC PANEL
Anion gap: 8 (ref 5–15)
BUN: 51 mg/dL — ABNORMAL HIGH (ref 8–23)
CO2: 27 mmol/L (ref 22–32)
Calcium: 8.7 mg/dL — ABNORMAL LOW (ref 8.9–10.3)
Chloride: 109 mmol/L (ref 98–111)
Creatinine, Ser: 1.94 mg/dL — ABNORMAL HIGH (ref 0.61–1.24)
GFR, Estimated: 31 mL/min — ABNORMAL LOW (ref >60)
Glucose, Bld: 154 mg/dL — ABNORMAL HIGH (ref 70–99)
Potassium: 4 mmol/L (ref 3.5–5.1)
Sodium: 144 mmol/L (ref 135–145)

## 2022-03-20 LAB — CBC
HCT: 27.5 % — ABNORMAL LOW (ref 39.0–52.0)
Hemoglobin: 8.7 g/dL — ABNORMAL LOW (ref 13.0–17.0)
MCH: 31.9 pg (ref 26.0–34.0)
MCHC: 31.6 g/dL (ref 30.0–36.0)
MCV: 100.7 fL — ABNORMAL HIGH (ref 80.0–100.0)
Platelets: 281 10*3/uL (ref 150–400)
RBC: 2.73 MIL/uL — ABNORMAL LOW (ref 4.22–5.81)
RDW: 16.1 % — ABNORMAL HIGH (ref 11.5–15.5)
WBC: 6.6 10*3/uL (ref 4.0–10.5)
nRBC: 0 % (ref 0.0–0.2)

## 2022-03-20 LAB — GLUCOSE, CAPILLARY
Glucose-Capillary: 141 mg/dL — ABNORMAL HIGH (ref 70–99)
Glucose-Capillary: 189 mg/dL — ABNORMAL HIGH (ref 70–99)
Glucose-Capillary: 134 mg/dL — ABNORMAL HIGH (ref 70–99)
Glucose-Capillary: 125 mg/dL — ABNORMAL HIGH (ref 70–99)

## 2022-03-20 MED ORDER — LORATADINE 10 MG PO TABS
10.0000 mg | ORAL_TABLET | Freq: Every day | ORAL | Status: DC | PRN
Start: 2022-03-20 — End: 2022-03-22

## 2022-03-20 NOTE — Progress Notes (Signed)
Occupational Therapy Session Note  Patient Details  Name: Steven Ward MRN: [id number] Date of Birth: May 07, 1925  Today's Date: 03/20/2022 OT Individual Time: 7116-5790 OT Individual Time Calculation (min): 8 min  OT missed time: 22 mins Missed time reason: fatigue, declined   Short Term Goals: Week 1:  OT Short Term Goal 1 (Week 1): STGS=LTGs due to ELOS  Skilled Therapeutic Interventions/Progress Updates:  Skilled OT intervention completed with focus on repositioning and pain reduction. Pt received halfway down in the bed, with BLE hanging over the EOB, asleep. Pt required multimodal stimuli to arouse. Encouraged pt to transition to sitting EOB in prep for therapy as well as for safer position however pt declining stating "no therapy, no more." Pt ultimately stating he is tired, but agreeable to assist with repositioning towards [place] for safe positioning and proper pressure relief. Mod A needed to pivot to supine, then with bed in trendelenburg position +2 min A needed with pt bridging in assist. Pt remained semi-supine, with BLE elevated for edema management as well as to prevent sliding down in the bed, with bed alarm on and all needs in reach at end of session.    Therapy Documentation Precautions:  Precautions Precautions: Fall Precaution Comments: Parkinson's Restrictions Weight Bearing Restrictions: No  Therapy/Group: Individual Therapy  Blase Mess, MS, OTR/L  03/20/2022, 7:42 AM

## 2022-03-20 NOTE — Progress Notes (Signed)
PROGRESS NOTE   Subjective/Complaints:  Reports has had mild runny nose postnasal drip.   ROS:   Pt denies SOB, abd pain, cough, CP, N/V/C/D, and vision changes   Objective:   No results found. Recent Labs    03/20/22 0642  WBC 6.6  HGB 8.7*  HCT 27.5*  PLT 281   Recent Labs    03/20/22 0642  NA 144  K 4.0  CL 109  CO2 27  GLUCOSE 154*  BUN 51*  CREATININE 1.94*  CALCIUM 8.7*     Intake/Output Summary (Last 24 hours) at 03/20/2022 0901 Last data filed at 03/20/2022 0813 Gross per 24 hour  Intake 358 ml  Output 16 ml  Net 342 ml      Pressure Injury 02/25/22 Coccyx Unstageable - Full thickness tissue loss in which the base of the injury is covered by slough (yellow, tan, gray, green or brown) and/or eschar (tan, brown or black) in the wound bed. (Active)  02/25/22 2100  Location: Coccyx  Location Orientation:   Staging: Unstageable - Full thickness tissue loss in which the base of the injury is covered by slough (yellow, tan, gray, green or brown) and/or eschar (tan, brown or black) in the wound bed.  Wound Description (Comments):   Present on Admission: Yes    Physical Exam: Vital Signs Blood pressure 128/71, pulse 79, temperature 98 F (36.7 C), temperature source Oral, resp. rate 18, height '5\' 8"'$  (1.727 m), weight 77.1 kg, SpO2 96 %.     General: awake, alert, appropriate, sitting up in bed; NAD HENT: conjugate gaze; oropharynx moist-no errythema CV: regular rate; no JVD Pulmonary: CTA B/L; no W/R/R- good air movement GI: soft, NT, ND, (+)BS Psychiatric: appropriate- but upset thinking was leaving today-  Neurological: Ox3  Skin: unstagable to coccyx Neuro: Alert and Oriented x 4, follows commands, sensation intact to LT in all 4 extremities. Able to name and repeat. Speech appears fluent.  No pronator drift. Cranial nerves II through XII intact other than tongue deviation to Rt.  Bradykinesia Resting tremor more on RUE than LUE- still notable FTN slow intact b/l Strength 4/5 in b/l UE 3/5 proximal LE, 4/5 distal LE Musculoskeletal:  Increased tone/Rigidity at in b/l UE and LE practically at elbows and knees with decreased extension at b/l Knees No joint redness noted No joint tenderness elicited Significant lower extremity edema bilaterally Psych: depressed mood   Assessment/Plan: 1. Functional deficits which require 3+ hours per day of interdisciplinary therapy in a comprehensive inpatient rehab setting. Physiatrist is providing close team supervision and 24 hour management of active medical problems listed below. Physiatrist and rehab team continue to assess barriers to discharge/monitor patient progress toward functional and medical goals  Care Tool:  Bathing    Body parts bathed by patient: Right arm, Left arm, Chest, Abdomen, Front perineal area, Buttocks, Right upper leg, Left upper leg, Face, Right lower leg, Left lower leg   Body parts bathed by helper: Left lower leg, Right lower leg     Bathing assist Assist Level: Contact Guard/Touching assist     Upper Body Dressing/Undressing Upper body dressing   What is the patient wearing?: Pull over  shirt    Upper body assist Assist Level: Supervision/Verbal cueing    Lower Body Dressing/Undressing Lower body dressing      What is the patient wearing?: Pants, Incontinence brief     Lower body assist Assist for lower body dressing: Moderate Assistance - Patient 50 - 74%     Toileting Toileting Toileting Activity did not occur (Clothing management and hygiene only): N/A (no void or bm)  Toileting assist Assist for toileting: Moderate Assistance - Patient 50 - 74% Assistive Device Comment: walker   Transfers Chair/bed transfer  Transfers assist     Chair/bed transfer assist level: Contact Guard/Touching assist     Locomotion Ambulation   Ambulation assist      Assist level:  Contact Guard/Touching assist Assistive device: Walker-rolling Max distance: 120'   Walk 10 feet activity   Assist     Assist level: Contact Guard/Touching assist Assistive device: Walker-rolling   Walk 50 feet activity   Assist    Assist level: Contact Guard/Touching assist Assistive device: Walker-rolling    Walk 150 feet activity   Assist    Assist level: Supervision/Verbal cueing Assistive device: Walker-rolling    Walk 10 feet on uneven surface  activity   Assist     Assist level: Minimal Assistance - Patient > 75% Assistive device: Walker-rolling   Wheelchair     Assist Is the patient using a wheelchair?: No             Wheelchair 50 feet with 2 turns activity    Assist            Wheelchair 150 feet activity     Assist          Blood pressure 128/71, pulse 79, temperature 98 F (36.7 C), temperature source Oral, resp. rate 18, height '5\' 8"'$  (1.727 m), weight 77.1 kg, SpO2 96 %.  Medical Problem List and Plan: 1. Functional deficits secondary to acute punctate infarct within the right cerebellar hemisphere, possible right frontal lobe infarct likely cardioembolic in the setting of paroxysmal atrial fibrillation             -patient may shower             -ELOS/Goals: 9-12 days             Continue CIR  Discussed with SW his preference for nursing home  Con't PT- OT and SLP- CIR  D/c scheduled for 7/5 2.  Antithrombotics: -DVT/anticoagulation:  Pharmaceutical: Other (comment)Eliquis 2.5 mg daily             -antiplatelet therapy: aspirin 81 mg daily 3. Pain from sacral pressure injury: ContinueTylenol to 1,'000mg'$  scheduled TID and air mattress ordered. Kpad ordered for muscles of lower back. Repeat CMP tomorrow 4. Depressed mood: chaplain consulted 5. Neuropsych/cognition: This patient is capable of making decisions on his own behalf. 6. Gluteal fold Stage 2 pressure injury, unstageable skin injury over coccyx. Air  mattress ordered. 7/1- encourage pt to turn q2 hours  7. Fluids/Electrolytes/Nutrition: Routine Is and Os and follow-up chemistries 8: Atrial fibrillation, paroxysmal:  -continue Lopressor 12.5 mg BID -continue Eliquis 9: Parkinson's disease: continue sinemet CR 10: DM-2: continue CBG monitoring and SSI             -was recently started on glipizide titrated up to 5 mg daily at home (not restarted) A1c = 7.9 on 6/9,   D/c meal coverage, d/c Ensure between meals  7/2- CBG's look good- con't regimen  7/3 well controlled,  continue to follow CBG (last 3)  Recent Labs    03/19/22 1648 03/19/22 2101 03/20/22 0641  GLUCAP 136* 106* 141*     11: Hypertension: continue to monitor, BP overall well controlled,sometimes a little soft             -continue Lopressor  7/2- BP slightly high to normal- con't regimen  7/3- Well controlled, follow Vitals:   03/19/22 1921 03/20/22 0350  BP: 118/65 128/71  Pulse: 64 79  Resp: 18 18  Temp: 98.2 F (36.8 C) 98 F (36.7 C)  SpO2: 96% 96%    12: CKD stage 3b: elevated BUN and creatinine: Baseline Cr 1.6 -2 follow-up chemistries and ensure adequate PO fluids. Cr improving, monitor weekly. -Cr 1.94, BUN 51, increase PO intake recommended    Latest Ref Rng & Units 03/20/2022    6:42 AM 03/16/2022    5:27 AM 03/15/2022    9:31 AM  BMP  Glucose 70 - 99 mg/dL 154  167  197   BUN 8 - 23 mg/dL 51  43  40   Creatinine 0.61 - 1.24 mg/dL 1.94  1.85  1.99   Sodium 135 - 145 mmol/L 144  143  141   Potassium 3.5 - 5.1 mmol/L 4.0  3.8  3.9   Chloride 98 - 111 mmol/L 109  111  109   CO2 22 - 32 mmol/L '27  25  22   '$ Calcium 8.9 - 10.3 mg/dL 8.7  8.4  8.4     13: Pulmonary plaques/LLL mass: follow-up as outpatient with pulmonology 14: Chronic pancreatic insufficiency, on creon 15: BPH: continue Flomax and monitor for retention             --has external urine collection system; voiding trial 16: Hypothyroidism: continue Synthroid 25 mcg daily 18: SBO:  resolved; now on regular diet             -last BM 6/22 19: recent Zoster to face 20: Anemia: hgb stable -HGB 8.7 continue to follow 21. RUL opacities, he was treated with ceftriaxone and flagyl 22. Lower extremity edema:Decrease lasix to '20mg'$  daily given AKI, placed nursing order to elevate, ice and apply compression garments 23. Urinary retention: continue flomax to 0.'8mg'$   7/1- still requiring in/out caths- on max dose of Flomax- con't regimen 24. Rhinorrhea  -Loratadine ordered   LOS: 11 days A FACE TO FACE EVALUATION WAS PERFORMED  Jennye Boroughs 03/20/2022, 9:01 AM

## 2022-03-20 NOTE — Progress Notes (Signed)
Patient ID: Steven Ward, male   DOB: Dec 10, 1924, 88 y.o.   MRN: [id number]  Met with pt to discuss once again the discharge plan. He is assisted living level due to need for supervision. Will do FL2 and have for when pt and son pursue this when home. Spoke with Tammy-HTA who reports it is highly unlikely pt would qualify for SNF due to how high level he is. Pt thought he was leaving today reminded him discharge is on 7/5 when son and daughter in-law return from being out of town. Pt was hoping to leave today. Work toward discharge 7/5

## 2022-03-20 NOTE — Progress Notes (Signed)
Physical Therapy Session Note  Patient Details  Name: Steven Ward MRN: [id number] Date of Birth: [birth year]  Today's Date: 03/20/2022 PT Individual Time: 1134-1203 PT Individual Time Calculation (min): 29 min   Short Term Goals: Week 1:  PT Short Term Goal 1 (Week 1): STG = LTG Week 2:  PT Short Term Goal 1 (Week 2): STG = LTG  Skilled Therapeutic Interventions/Progress Updates:  Patient supine in bed on entrance to room. Patient alert and unsure as to participation level for this PT session.   Patient with no pain complaint throughout session.  Education and encouragement provided throughout session with pt demonstrating a lack of spirit and determination.   Therapeutic Activity: Bed Mobility: Patient performed supine --> sit with max cues and encouragement, requiring Min/ ModA to reach seated position on EOB. Pt c/o dizziness and educated re: needing to spend more time in upright position in order to reduce overall symptoms. Encouraged to stay upright to time length od symptoms. In preparing to transfer to w/c, pt relates that he is still dizzy and needs to lay down. Requires modA for BLE to bed surface. VC/ tc required for technique in log rolling technique and in hand/ UE placement throughout. Donned TED hose with MaxA to encourage reduction in swelling in feet and ankles. Pt appreciative. Pt assisted in pushing up toward [facility] using BLE in leg press. Completes with MinA. Once in hooklying, encouraged to participate in bridging, heel slides into single leg press against therapist resistance x10. Completes LAQ, and SLR x10 each side.   Patient asks if therapist is coming back later. Informed pt as to schedule with only one other session this day with OT. Pt supine at end of session with brakes locked, bed alarm set, and all needs within reach.   Therapy Documentation Precautions:  Precautions Precautions: Fall Precaution Comments: Parkinson's Restrictions Weight Bearing  Restrictions: No General:   Vital Signs:  Pain: Pain Assessment Pain Scale: 0-10 Pain Score: 0-No pain   Therapy/Group: Individual Therapy  [name] PT, DPT, CSRS 03/20/2022, 5:18 PM

## 2022-03-21 ENCOUNTER — Other Ambulatory Visit (HOSPITAL_COMMUNITY): Payer: Self-pay

## 2022-03-21 DIAGNOSIS — H04123 Dry eye syndrome of bilateral lacrimal glands: Secondary | ICD-10-CM

## 2022-03-21 LAB — GLUCOSE, CAPILLARY
Glucose-Capillary: 151 mg/dL — ABNORMAL HIGH (ref 70–99)
Glucose-Capillary: 119 mg/dL — ABNORMAL HIGH (ref 70–99)
Glucose-Capillary: 167 mg/dL — ABNORMAL HIGH (ref 70–99)
Glucose-Capillary: 138 mg/dL — ABNORMAL HIGH (ref 70–99)

## 2022-03-21 NOTE — Progress Notes (Signed)
Inpatient Rehabilitation Care Coordinator Discharge Note   Patient Details  Name: Steven Ward MRN: [id number] Date of Birth: 05-13-1925   Discharge location: [place] OF TEAM'S RECOMMENDATION OF 24/7 SUPERVISION  Length of Stay: 13 DAYS  Discharge activity level: SUPERVISION LEVEL  Home/community participation: SEDENTARY  Patient response OY:DXAJOI Literacy - How often do you need to have someone help you when you read instructions, pamphlets, or other written material from your doctor or pharmacy?: Never  Patient response NO:MVEHMC Isolation - How often do you feel lonely or isolated from those around you?: Always  Services provided included: MD, RD, PT, OT, SLP, RN, CM, TR, Pharmacy, SW  Financial Services:  Charity fundraiser Utilized: Caroline offered to/list presented to: PT AND SON  Follow-up services arranged:  Home Health, DME, Patient/Family has no preference for HH/DME agencies Home Health Agency: [place]    DME : [facility] [place] [facility]  Patient response to transportation need: Is the patient able to respond to transportation needs?: Yes In the past 12 months, has lack of transportation kept you from medical appointments or from getting medications?: No In the past 12 months, has lack of transportation kept you from meetings, work, or from getting things needed for daily living?: No    Comments (or additional information): PT WANTED TO GO TO A SNF BUT TOO HIGH LEVEL FOR THIS. DISCUSSED ALF WITH PT AND SON AND THEY WILL PURSUE ONCE DISCHARGED. HAS LIST OF ALF'S AND FL2.   Patient/Family verbalized understanding of follow-up arrangements:  Yes  Individual responsible for coordination of the follow-up plan: Royetta Car [phone]  Confirmed correct DME delivered: Elease Hashimoto 03/21/2022    Rory Xiang,  Gardiner Rhyme

## 2022-03-21 NOTE — Progress Notes (Signed)
Physical Therapy Session Note  Patient Details  Name: Steven Ward MRN: [id number] Date of Birth: [birth year]  Today's Date: 03/21/2022 PT Individual Time: 0800-0826 PT Individual Time Calculation (min): 26 min   Short Term Goals: Week 2:  PT Short Term Goal 1 (Week 2): STG = LTG  Skilled Therapeutic Interventions/Progress Updates:      Pt supine in bed to start - agreeable to therapy session. Reports 7/10 chronic LBP - mobility and distraction provided for pain management. Supine<>sitting EOB with supervision with hospital bed features. Sit>Stand from EOB with minA for powering to rise. CGA provided for stand<>pivot transfer to w/c using RW. Transported to main rehab gym for time and energy conservation. Sit<>stand from w/c using arm rests with supervision and RW. Gait training 2x16f with supervision and RW - gait speed decreased with downward gaze and forward flexed trunk. Car transfer completed with setupA with low height, simulating sedan. Stair training up/down x12 steps using 1 hand rail per home setup, CGA with intermittent minA towards last few steps due to fatigue and decreased safety awareness. Returned to room and remained seated in w/c, all needs met, call bell in reach.    Therapy Documentation Precautions:  Precautions Precautions: Fall Precaution Comments: Parkinson's Restrictions Weight Bearing Restrictions: No General:     Therapy/Group: Individual Therapy  CAlger Simons7/12/2021, 7:37 AM

## 2022-03-21 NOTE — Progress Notes (Signed)
Occupational Therapy Discharge Summary  Patient Details  Name: Steven Ward MRN: [id number] Date of Birth: 04-04-1925  Today's Date: 03/21/2022 OT Individual Time: 1010-1050 OT Individual Time Calculation (min): 40 min  Today's skilled intervention:   Patient initially stating that he did not wish to get out of bed - he was comfortable.  Patient encouraged to get up to try to void and possibly shave.  Patient agreeable after much coaxing, and sat uo from supine position without assistance.  Patient indicated need to void and transferred to [facility] next to bed with CGA.  Patient unable to void.  Patient then transferred to wheelchair and to sink to wash upper body and change into clean shirt.  Patient shaved with set up assistance.  Patient again indicated need to void, and again transferred to [facility] with CGA.   Patient returned to bed, and personal items, call bell within reach.    Patient has met 1 of 6 long term goals due to improved balance.  Patient to discharge at overall Mod Assist level.  Patient's care partner unavailable to provide the necessary physical assistance at discharge.    Reasons goals not met: Patient had been close to baseline, but has declined active participation in therapy - he had hoped to transition to SNF.     Equipment: No equipment provided  Reasons for discharge: treatment goals met  Patient/family agrees with progress made and goals achieved: Yes  Patient preferred to transition to next level of care versus home with family.  Patient and family given resources for Assisted Living Facilities that he may pursue once home.    OT Discharge Precautions/Restrictions  Precautions Precautions: Fall Precaution Comments: Parkinson's Restrictions Weight Bearing Restrictions: No  Pain Pain Assessment Pain Score: 0-No pain ADL ADL Equipment Provided: [place]acher Eating: Modified independent Where Assessed-Eating: Wheelchair Grooming: Supervision/safety Where  Assessed-Grooming: Sitting at sink Upper Body Bathing: Supervision/safety Where Assessed-Upper Body Bathing: Sitting at sink Lower Body Bathing: Minimal assistance Where Assessed-Lower Body Bathing: Sitting at sink, Standing at sink Upper Body Dressing: Independent Where Assessed-Upper Body Dressing: Sitting at sink Lower Body Dressing: Moderate assistance Where Assessed-Lower Body Dressing: Wheelchair, Standing at sink Toileting: Moderate assistance Where Assessed-Toileting: Bedside Commode Toilet Transfer: Contact guard Toilet Transfer Method: Counselling psychologist: Grab bars, Bedside commode Tub/Shower Transfer: Not assessed (Patient adamantly declined on multiple occasions) Tub/Shower Transfer Method: Unable to assess Social research officer, government: Unable to assess ADL Comments: Patient required significant coaxing to participate in daily ADL Vision Baseline Vision/History: 1 Wears glasses Patient Visual Report: Blurring of vision Vision Assessment?: No apparent visual deficits Eye Alignment: Within Functional Limits Ocular Range of Motion: Within Functional Limits Alignment/Gaze Preference: Within Defined Limits Tracking/Visual Pursuits: Able to track stimulus in all quads without difficulty Saccades: Within functional limits Convergence: Within functional limits Visual Fields: No apparent deficits Perception  Perception: Within Functional Limits Praxis Praxis: Intact Cognition Cognition Overall Cognitive Status: Within Functional Limits for tasks assessed Arousal/Alertness: Awake/alert Memory: Appears intact Attention: Selective Focused Attention: Appears intact Sustained Attention: Appears intact Selective Attention: Impaired Selective Attention Impairment: Functional complex Awareness: Appears intact Problem Solving: Appears intact Behaviors: Poor frustration tolerance;Other (comment) (Disinterest) Safety/Judgment: Impaired Comments: Little interest in  therapy - does not connect movement with improved health Brief Interview for Mental Status (BIMS) Repetition of Three Words (First Attempt): 3 Temporal Orientation: Year: Correct Temporal Orientation: Month: Accurate within 5 days Temporal Orientation: Day: Correct Recall: "Sock": Yes, no cue required Recall: "Blue": Yes, no cue required Recall: "Bed": Yes,  no cue required BIMS Summary Score: 15   Motor  Motor Motor: Abnormal tone;Other (comment) Motor - Discharge Observations: Parkinson's Disease, overall deconditioned, fatigues quickly then tends to move fast Mobility  Bed Mobility Bed Mobility: Supine to Sit;Sit to Supine Rolling Right: Independent with assistive device Rolling Left: Independent with assistive device Supine to Sit: Independent with assistive device Sit to Supine: Minimal Assistance - Patient > 75% Transfers Sit to Stand: Contact Guard/Touching assist Stand to Sit: Contact Guard/Touching assist  Trunk/Postural Assessment  Cervical Assessment Cervical Assessment: Exceptions to [facility] Thoracic Assessment Thoracic Assessment: Exceptions to [facility] Lumbar Assessment Lumbar Assessment: Exceptions to [facility] Postural Control Postural Control: Deficits on evaluation Trunk Control: Forward head, posterior pelvis, rounded trunk Protective Responses: delayed  Balance Balance Balance Assessed: Yes Static Sitting Balance Static Sitting - Balance Support: No upper extremity supported;Feet supported Static Sitting - Level of Assistance: 6: Modified independent (Device/Increase time) Dynamic Sitting Balance Dynamic Sitting - Balance Support: During functional activity;Feet supported Dynamic Sitting - Level of Assistance: 5: Stand by assistance Dynamic Sitting - Balance Activities: Lateral lean/weight shifting;Forward lean/weight shifting;Reaching for objects Sitting balance - Comments: resists reaching to feet Static Standing Balance Static Standing - Balance Support: Right  upper extremity supported;Left upper extremity supported;During functional activity Static Standing - Level of Assistance: 5: Stand by assistance Static Standing - Comment/# of Minutes: 30 seconds Dynamic Standing Balance Dynamic Standing - Balance Support: Right upper extremity supported;Left upper extremity supported;During functional activity Dynamic Standing - Level of Assistance: 4: Min assist Dynamic Standing - Balance Activities: Lateral lean/weight shifting;Forward lean/weight shifting;Reaching for objects Dynamic Standing - Comments: very small excursion off base of support Extremity/Trunk Assessment RUE Assessment RUE Assessment: Exceptions to [facility] Active Range of Motion (AROM) Comments: Limited abillity for end range shoulder flex (trunk flexed) General Strength Comments: 4-/5 LUE Assessment LUE Assessment: Exceptions to [facility] Active Range of Motion (AROM) Comments: Limited end range shoulder flex (trunk flexed) General Strength Comments: 4-/5   Mariah Milling 03/21/2022, 12:57 PM

## 2022-03-21 NOTE — Progress Notes (Signed)
PROGRESS NOTE   Subjective/Complaints:  Pt asks about eye drops for dry eyes.    ROS:   Pt denies SOB, abd pain, HA, CP, N/V/C/D, and vision changes   Objective:   No results found. Recent Labs    03/20/22 0642  WBC 6.6  HGB 8.7*  HCT 27.5*  PLT 281    Recent Labs    03/20/22 0642  NA 144  K 4.0  CL 109  CO2 27  GLUCOSE 154*  BUN 51*  CREATININE 1.94*  CALCIUM 8.7*      Intake/Output Summary (Last 24 hours) at 03/21/2022 1216 Last data filed at 03/21/2022 0803 Gross per 24 hour  Intake 357 ml  Output --  Net 357 ml      Pressure Injury 02/25/22 Coccyx Unstageable - Full thickness tissue loss in which the base of the injury is covered by slough (yellow, tan, gray, green or brown) and/or eschar (tan, brown or black) in the wound bed. (Active)  02/25/22 2100  Location: Coccyx  Location Orientation:   Staging: Unstageable - Full thickness tissue loss in which the base of the injury is covered by slough (yellow, tan, gray, green or brown) and/or eschar (tan, brown or black) in the wound bed.  Wound Description (Comments):   Present on Admission: Yes    Physical Exam: Vital Signs Blood pressure 133/61, pulse 76, temperature 98.1 F (36.7 C), resp. rate 14, height '5\' 8"'$  (1.727 m), weight 77.1 kg, SpO2 98 %.     General: awake, alert, appropriate, sitting up in bed; NAD HENT: conjugate gaze; oropharynx moist-no errythema CV: regular rate; no JVD Pulmonary: CTA B/L; no W/R/R- good air movement GI: soft, NT, ND, (+)BS Psychiatric: appropriate- decreased affect Neurological: Ox3  Skin: unstagable to coccyx Neuro: Alert and Oriented x 4, follows commands, sensation intact to LT in all 4 extremities. Able to name and repeat. Speech appears fluent.  No pronator drift. Cranial nerves II through XII intact other than tongue deviation to Rt. Bradykinesia Resting tremor more on RUE than LUE- still  notable FTN slow intact b/l Strength 4/5 in b/l UE 3/5 proximal LE, 4/5 distal LE Musculoskeletal:  Increased tone/Rigidity at in b/l UE and LE practically at elbows and knees with decreased extension at b/l Knees No joint redness noted No joint tenderness elicited Significant lower extremity edema bilaterally Psych: depressed mood   Assessment/Plan: 1. Functional deficits which require 3+ hours per day of interdisciplinary therapy in a comprehensive inpatient rehab setting. Physiatrist is providing close team supervision and 24 hour management of active medical problems listed below. Physiatrist and rehab team continue to assess barriers to discharge/monitor patient progress toward functional and medical goals  Care Tool:  Bathing    Body parts bathed by patient: Right arm, Left arm, Chest, Abdomen, Front perineal area, Buttocks, Right upper leg, Left upper leg, Face, Right lower leg, Left lower leg   Body parts bathed by helper: Left lower leg, Right lower leg     Bathing assist Assist Level: Contact Guard/Touching assist     Upper Body Dressing/Undressing Upper body dressing   What is the patient wearing?: Pull over shirt    Upper body  assist Assist Level: Supervision/Verbal cueing    Lower Body Dressing/Undressing Lower body dressing      What is the patient wearing?: Pants, Incontinence brief     Lower body assist Assist for lower body dressing: Moderate Assistance - Patient 50 - 74%     Toileting Toileting Toileting Activity did not occur (Clothing management and hygiene only): N/A (no void or bm)  Toileting assist Assist for toileting: Moderate Assistance - Patient 50 - 74% Assistive Device Comment: walker   Transfers Chair/bed transfer  Transfers assist     Chair/bed transfer assist level: Contact Guard/Touching assist     Locomotion Ambulation   Ambulation assist      Assist level: Supervision/Verbal cueing Assistive device:  Walker-rolling Max distance: 150'   Walk 10 feet activity   Assist     Assist level: Supervision/Verbal cueing Assistive device: Walker-rolling   Walk 50 feet activity   Assist    Assist level: Supervision/Verbal cueing Assistive device: Walker-rolling    Walk 150 feet activity   Assist    Assist level: Supervision/Verbal cueing Assistive device: Walker-rolling    Walk 10 feet on uneven surface  activity   Assist     Assist level: Contact Guard/Touching assist Assistive device: Walker-rolling   Wheelchair     Assist Is the patient using a wheelchair?: No             Wheelchair 50 feet with 2 turns activity    Assist            Wheelchair 150 feet activity     Assist          Blood pressure 133/61, pulse 76, temperature 98.1 F (36.7 C), resp. rate 14, height '5\' 8"'$  (1.727 m), weight 77.1 kg, SpO2 98 %.  Medical Problem List and Plan: 1. Functional deficits secondary to acute punctate infarct within the right cerebellar hemisphere, possible right frontal lobe infarct likely cardioembolic in the setting of paroxysmal atrial fibrillation             -patient may shower             -ELOS/Goals: 9-12 days             Continue CIR  Discussed with SW his preference for nursing home  Con't PT- OT and SLP- CIR  D/c scheduled for 7/5-tomorrow 2.  Antithrombotics: -DVT/anticoagulation:  Pharmaceutical: Other (comment)Eliquis 2.5 mg daily             -antiplatelet therapy: aspirin 81 mg daily 3. Pain from sacral pressure injury: ContinueTylenol to 1,'000mg'$  scheduled TID and air mattress ordered. Kpad ordered for muscles of lower back. Repeat CMP tomorrow 4. Depressed mood: chaplain consulted 5. Neuropsych/cognition: This patient is capable of making decisions on his own behalf. 6. Gluteal fold Stage 2 pressure injury, unstageable skin injury over coccyx. Air mattress ordered. 7/1- encourage pt to turn q2 hours  7.  Fluids/Electrolytes/Nutrition: Routine Is and Os and follow-up chemistries 8: Atrial fibrillation, paroxysmal:  -continue Lopressor 12.5 mg BID -continue Eliquis 9: Parkinson's disease: continue sinemet CR 10: DM-2: continue CBG monitoring and SSI             -was recently started on glipizide titrated up to 5 mg daily at home (not restarted) A1c = 7.9 on 6/9,   D/c meal coverage, d/c Ensure between meals  7/2- CBG's look good- con't regimen  7/4 well controlled CBG (last 3)  Recent Labs    03/20/22 2047 03/21/22 0640 03/21/22 1133  GLUCAP 125* 151* 119*     11: Hypertension: continue to monitor, BP overall well controlled,sometimes a little soft             -continue Lopressor  7/2- BP slightly high to normal- con't regimen  7/4 well controlled Vitals:   03/20/22 1917 03/21/22 0431  BP: 121/76 133/61  Pulse: 83 76  Resp: 14 14  Temp: 98 F (36.7 C) 98.1 F (36.7 C)  SpO2: 95% 98%    12: CKD stage 3b: elevated BUN and creatinine: Baseline Cr 1.6 -2 follow-up chemistries and ensure adequate PO fluids. Cr improving, monitor weekly. -Cr 1.94, BUN 51, increase PO intake recommended Recheck tomorrow    Latest Ref Rng & Units 03/20/2022    6:42 AM 03/16/2022    5:27 AM 03/15/2022    9:31 AM  BMP  Glucose 70 - 99 mg/dL 154  167  197   BUN 8 - 23 mg/dL 51  43  40   Creatinine 0.61 - 1.24 mg/dL 1.94  1.85  1.99   Sodium 135 - 145 mmol/L 144  143  141   Potassium 3.5 - 5.1 mmol/L 4.0  3.8  3.9   Chloride 98 - 111 mmol/L 109  111  109   CO2 22 - 32 mmol/L '27  25  22   '$ Calcium 8.9 - 10.3 mg/dL 8.7  8.4  8.4     13: Pulmonary plaques/LLL mass: follow-up as outpatient with pulmonology 14: Chronic pancreatic insufficiency, on creon 15: BPH: continue Flomax and monitor for retention             --has external urine collection system; voiding trial 16: Hypothyroidism: continue Synthroid 25 mcg daily 18: SBO: resolved; now on regular diet             -last BM 6/22 19: recent  Zoster to face 20: Anemia: hgb stable -HGB 8.7 continue to follow 21. RUL opacities, he was treated with ceftriaxone and flagyl 22. Lower extremity edema:Decrease lasix to '20mg'$  daily given AKI, placed nursing order to elevate, ice and apply compression garments 23. Urinary retention: continue flomax to 0.'8mg'$   7/1- still requiring in/out caths- on max dose of Flomax- con't regimen 24. Rhinorrhea  -Loratadine ordered 25. Dry eyes  -Artifical tears prn  LOS: 12 days A FACE TO FACE EVALUATION WAS PERFORMED  Jennye Boroughs 03/21/2022, 12:16 PM

## 2022-03-21 NOTE — Discharge Summary (Signed)
Physical Therapy Discharge Summary  Patient Details  Name: Steven Ward MRN: [id number] Date of Birth: [birth year]  Patient has met 7 of 8 long term goals due to improved activity tolerance, improved balance, improved postural control, increased strength, decreased pain, and ability to compensate for deficits.  Patient to discharge at an ambulatory level Supervision.   Patient's care partner is independent to provide the necessary physical and cognitive assistance at discharge.  Reasons goals not met: Stair goal set to CGA using 1 hand railing to navigate 12 steps. Pt required light minA (pt 90%) for navigating stairs safely while using just 1 hand rail.   Recommendation:  Patient will benefit from ongoing skilled PT services in home health setting to continue to advance safe functional mobility, address ongoing impairments in dynamic standing balance, activity tolerance, general strengthening, home safety, and minimize fall risk.  Equipment: RW  Reasons for discharge: treatment goals met and discharge from hospital  Patient/family agrees with progress made and goals achieved: Yes  PT Discharge Precautions/Restrictions Precautions Precautions: Fall Precaution Comments: Parkinson's Restrictions Weight Bearing Restrictions: No Pain Interference Pain Interference Pain Effect on Sleep: 3. Frequently Pain Interference with Therapy Activities: 2. Occasionally Pain Interference with Day-to-Day Activities: 2. Occasionally Vision/Perception  Vision - History Ability to See in Adequate Light: 0 Adequate Perception Perception: Within Functional Limits Praxis Praxis: Intact  Cognition Overall Cognitive Status: No family/caregiver present to determine baseline cognitive functioning Arousal/Alertness: Awake/alert Orientation Level: Oriented X4 Focused Attention: Appears intact Sustained Attention: Appears intact Memory: Appears intact Awareness: Appears intact Problem Solving:  Appears intact Safety/Judgment: Impaired Sensation Sensation Light Touch: Appears Intact Hot/Cold: Appears Intact Proprioception: Appears Intact Stereognosis: Appears Intact Coordination Gross Motor Movements are Fluid and Coordinated: No Coordination and Movement Description: Mild tremory in hands 2/2 parkinson's at baseline. Global weakness and deconditioning limiting efficiency with movement patterns Heel Shin Test: Limited ROM due to weakness but [facility] Motor  Motor Motor: Other (comment) Motor - Discharge Observations: Generalized weakness and deconditioning. Baseline Parkinson's  Mobility Bed Mobility Bed Mobility: Supine to Sit;Sit to Supine Rolling Right: Independent with assistive device Rolling Left: Independent with assistive device Supine to Sit: Minimal Assistance - Patient > 75% Sit to Supine: Minimal Assistance - Patient > 75% Transfers Transfers: Sit to Stand;Stand Pivot Transfers;Stand to Sit Sit to Stand: Contact Guard/Touching assist Stand to Sit: Contact Guard/Touching assist Stand Pivot Transfers: Contact Guard/Touching assist Stand Pivot Transfer Details: Verbal cues for gait pattern;Verbal cues for safe use of DME/AE;Verbal cues for precautions/safety;Verbal cues for sequencing;Verbal cues for technique;Tactile cues for posture Transfer (Assistive device): Rolling walker Locomotion  Gait Ambulation: Yes Gait Assistance: Supervision/Verbal cueing Gait Distance (Feet): 150 Feet Assistive device: Rolling walker Gait Assistance Details: Verbal cues for safe use of DME/AE;Verbal cues for gait pattern;Verbal cues for precautions/safety Gait Gait: Yes Gait Pattern: Impaired Gait Pattern: Step-through pattern;Trunk flexed;Poor foot clearance - right;Poor foot clearance - left;Decreased hip/knee flexion - right;Decreased hip/knee flexion - left;Decreased stride length Stairs / Additional Locomotion Stairs: Yes Stairs Assistance: Minimal Assistance - Patient >  75%;Contact Guard/Touching assist Stair Management Technique: One rail Right Number of Stairs: 12 Height of Stairs: 6 Pick up small object from the floor assist level: Minimal Assistance - Patient > 75% Wheelchair Mobility Wheelchair Mobility: No  Trunk/Postural Assessment  Cervical Assessment Cervical Assessment: Exceptions to [facility] (forward head) Thoracic Assessment Thoracic Assessment: Exceptions to [facility] (upward rotation and rounded shoulders) Lumbar Assessment Lumbar Assessment: Exceptions to [facility] (rigid posterior pelvic tilt in sitting) Postural Control Postural Control:  Within Functional Limits  Balance Balance Balance Assessed: Yes Static Sitting Balance Static Sitting - Balance Support: No upper extremity supported;Feet supported Static Sitting - Level of Assistance: 6: Modified independent (Device/Increase time) Dynamic Sitting Balance Dynamic Sitting - Balance Support: During functional activity;Feet supported Dynamic Sitting - Level of Assistance: 5: Stand by assistance Static Standing Balance Static Standing - Balance Support: No upper extremity supported;During functional activity Static Standing - Level of Assistance: 5: Stand by assistance Dynamic Standing Balance Dynamic Standing - Balance Support: Bilateral upper extremity supported;During functional activity Dynamic Standing - Level of Assistance: 5: Stand by assistance Extremity Assessment      RLE Assessment RLE Assessment: Exceptions to [facility] Passive Range of Motion (PROM) Comments: Limited knee extension ~10deg General Strength Comments: Grossly 4/5 LLE Assessment LLE Assessment: Exceptions to [facility] Passive Range of Motion (PROM) Comments: Limited knee extension ~10deg General Strength Comments: Grossly 4/5    Mardee Clune P Clydette Privitera 03/21/2022, 8:23 AM

## 2022-03-21 NOTE — Progress Notes (Incomplete)
Inpatient Rehabilitation Discharge Medication Review by a Pharmacist  A complete drug regimen review was completed for this patient to identify any potential clinically significant medication issues.  High Risk Drug Classes Is patient taking? Indication by Medication  Antipsychotic No   Anticoagulant Yes Apixaban - CVA/Afib  Antibiotic No   Opioid No   Antiplatelet Yes ASA-CVA ppx  Hypoglycemics/insulin Yes Glucotrol - DM  Vasoactive Medication Yes Metoprolol - AFib Furosemide - fluid Tamsulosin - BPH  Chemotherapy No   Other Yes Sinemet - Parkinson's Levothyroxine - hypothyroid     Type of Medication Issue Identified Description of Issue Recommendation(s)  Drug Interaction(s) (clinically significant)     Duplicate Therapy     Allergy     No Medication Administration End Date     Incorrect Dose     Additional Drug Therapy Needed     Significant med changes from prior encounter (inform family/care partners about these prior to discharge). PTA meds: HCTZ - HTN Creon - pancreatic insufficiency Consider resuming on discharge if appropriate  Other       Clinically significant medication issues were identified that warrant physician communication and completion of prescribed/recommended actions by midnight of the next day:  No  Pharmacist comments:   Time spent performing this drug regimen review (minutes):  30 minutes

## 2022-03-22 DIAGNOSIS — E039 Hypothyroidism, unspecified: Secondary | ICD-10-CM

## 2022-03-22 LAB — GLUCOSE, CAPILLARY
Glucose-Capillary: 122 mg/dL — ABNORMAL HIGH (ref 70–99)
Glucose-Capillary: 127 mg/dL — ABNORMAL HIGH (ref 70–99)

## 2022-03-22 LAB — BASIC METABOLIC PANEL
Anion gap: 10 (ref 5–15)
BUN: 48 mg/dL — ABNORMAL HIGH (ref 8–23)
CO2: 24 mmol/L (ref 22–32)
Calcium: 8.3 mg/dL — ABNORMAL LOW (ref 8.9–10.3)
Chloride: 105 mmol/L (ref 98–111)
Creatinine, Ser: 1.83 mg/dL — ABNORMAL HIGH (ref 0.61–1.24)
GFR, Estimated: 34 mL/min — ABNORMAL LOW (ref >60)
Glucose, Bld: 148 mg/dL — ABNORMAL HIGH (ref 70–99)
Potassium: 3.7 mmol/L (ref 3.5–5.1)
Sodium: 139 mmol/L (ref 135–145)

## 2022-03-22 MED ORDER — COLLAGENASE 250 UNIT/GM EX OINT: TOPICAL_OINTMENT | Freq: Every day | CUTANEOUS | 0 refills | Status: AC

## 2022-03-22 MED ORDER — POLYVINYL ALCOHOL 1.4 % OP SOLN: 2.0000 [drp] | OPHTHALMIC | 0 refills | Status: AC | PRN

## 2022-03-22 MED ORDER — CARBIDOPA-LEVODOPA ER 25-100 MG PO TBCR: 1.0000 | EXTENDED_RELEASE_TABLET | Freq: Every evening | ORAL | 0 refills | Status: AC

## 2022-03-22 MED ORDER — LEVOTHYROXINE SODIUM 25 MCG PO TABS: 25.0000 ug | ORAL_TABLET | Freq: Every day | ORAL | 0 refills | Status: DC

## 2022-03-22 MED ORDER — ADULT MULTIVITAMIN W/MINERALS CH: 1.0000 | ORAL_TABLET | Freq: Every day | ORAL | Status: AC

## 2022-03-22 MED ORDER — APIXABAN 2.5 MG PO TABS
2.5000 mg | ORAL_TABLET | Freq: Two times a day (BID) | ORAL | 0 refills | Status: AC
Start: 2022-03-22 — End: ?

## 2022-03-22 MED ORDER — LORATADINE 10 MG PO TABS
10.0000 mg | ORAL_TABLET | Freq: Every day | ORAL | Status: DC | PRN
Start: 2022-03-22 — End: 2022-05-10

## 2022-03-22 MED ORDER — TAMSULOSIN HCL 0.4 MG PO CAPS: 0.8000 mg | ORAL_CAPSULE | Freq: Every day | ORAL | 0 refills | Status: DC

## 2022-03-22 MED ORDER — ASPIRIN 81 MG PO CHEW: 81.0000 mg | CHEWABLE_TABLET | Freq: Every day | ORAL | 0 refills | Status: DC

## 2022-03-22 MED ORDER — FUROSEMIDE 20 MG PO TABS: 20.0000 mg | ORAL_TABLET | Freq: Every day | ORAL | 0 refills | Status: DC

## 2022-03-22 MED ORDER — METOPROLOL TARTRATE 25 MG PO TABS: 12.5000 mg | ORAL_TABLET | Freq: Two times a day (BID) | ORAL | 0 refills | Status: DC

## 2022-03-22 MED ORDER — CARBIDOPA-LEVODOPA ER 25-100 MG PO TBCR: 1.0000 | EXTENDED_RELEASE_TABLET | Freq: Two times a day (BID) | ORAL | 0 refills | Status: AC

## 2022-03-22 MED ORDER — MAGNESIUM OXIDE -MG SUPPLEMENT 400 (240 MG) MG PO TABS
400.0000 mg | ORAL_TABLET | Freq: Two times a day (BID) | ORAL | 0 refills | Status: AC
Start: 2022-03-22 — End: ?

## 2022-03-22 NOTE — Progress Notes (Signed)
Inpatient Rehabilitation Discharge Medication Review by a Pharmacist  A complete drug regimen review was completed for this patient to identify any potential clinically significant medication issues.  High Risk Drug Classes Is patient taking? Indication by Medication  Antipsychotic No   Anticoagulant Yes Apixaban - CVA/Afib  Antibiotic No   Opioid No   Antiplatelet Yes   Hypoglycemics/insulin Yes Glipizide: DM  Vasoactive Medication Yes Metoprolol - AFib Furosemide - fluid Tamsulosin - BPH  Chemotherapy No   Other Yes Sinemet - Parkinson's Levothyroxine - hypothyroid Loratadine: PRN allergies Mag oxide, MV: supplement Liquifilm Tears: PRN dry eyes Ondansetron: PRN nausea/vomiting    Clinically significant medication issues were identified that warrant physician communication and completion of prescribed/recommended actions by midnight of the next day:  Yes, discussed and resolved  Name of provider notified for urgent issues identified: Risa Grill, PA-C  Provider Method of Notification: secure chat    Pharmacist comments:  Clarified with previous neurology consult recommendations and inpatient discharge physician: continue apixaban and stop aspirin at discharge   Aspirin removed from discharge medications   Time spent performing this drug regimen review (minutes): 20   Thank you for allowing pharmacy to be a part of this patient's care.  Ardyth Harps, PharmD Clinical Pharmacist

## 2022-03-22 NOTE — Progress Notes (Signed)
PROGRESS NOTE   Subjective/Complaints: No new complaints this morning Disappointed to be going home today, discussed assisted living as a good option and he plans to consider this   ROS:   Pt denies SOB, abd pain, HA, CP, N/V/C/D, and vision changes   Objective:   No results found. Recent Labs    03/20/22 0642  WBC 6.6  HGB 8.7*  HCT 27.5*  PLT 281   Recent Labs    03/20/22 0642 03/22/22 0531  NA 144 139  K 4.0 3.7  CL 109 105  CO2 27 24  GLUCOSE 154* 148*  BUN 51* 48*  CREATININE 1.94* 1.83*  CALCIUM 8.7* 8.3*     Intake/Output Summary (Last 24 hours) at 03/22/2022 1517 Last data filed at 03/21/2022 1745 Gross per 24 hour  Intake 460 ml  Output --  Net 460 ml     Pressure Injury 02/25/22 Coccyx Unstageable - Full thickness tissue loss in which the base of the injury is covered by slough (yellow, tan, gray, green or brown) and/or eschar (tan, brown or black) in the wound bed. (Active)  02/25/22 2100  Location: Coccyx  Location Orientation:   Staging: Unstageable - Full thickness tissue loss in which the base of the injury is covered by slough (yellow, tan, gray, green or brown) and/or eschar (tan, brown or black) in the wound bed.  Wound Description (Comments):   Present on Admission: Yes    Physical Exam: Vital Signs Blood pressure 128/68, pulse 90, temperature 98.4 F (36.9 C), resp. rate 14, height '5\' 8"'$  (1.727 m), weight 77.1 kg, SpO2 100 %.  Gen: no distress, normal appearing HEENT: oral mucosa pink and moist, NCAT Cardio: Reg rate Chest: normal effort, normal rate of breathing Abd: soft, non-distended Ext: no edema Psych: pleasant, normal affect  Skin: unstagable to coccyx Neuro: Alert and Oriented x 4, follows commands, sensation intact to LT in all 4 extremities. Able to name and repeat. Speech appears fluent.  No pronator drift. Cranial nerves II through XII intact other than tongue  deviation to Rt. Bradykinesia Resting tremor more on RUE than LUE- still notable FTN slow intact b/l Strength 4/5 in b/l UE 3/5 proximal LE, 4/5 distal LE Musculoskeletal:  Increased tone/Rigidity at in b/l UE and LE practically at elbows and knees with decreased extension at b/l Knees No joint redness noted No joint tenderness elicited Significant lower extremity edema bilaterally Psych: depressed mood   Assessment/Plan: 1. Functional deficits which require 3+ hours per day of interdisciplinary therapy in a comprehensive inpatient rehab setting. Physiatrist is providing close team supervision and 24 hour management of active medical problems listed below. Physiatrist and rehab team continue to assess barriers to discharge/monitor patient progress toward functional and medical goals  Care Tool:  Bathing    Body parts bathed by patient: Right arm, Left arm, Chest, Abdomen, Face   Body parts bathed by helper: Left lower leg, Right lower leg Body parts n/a: Buttocks   Bathing assist Assist Level: Moderate Assistance - Patient 50 - 74%     Upper Body Dressing/Undressing Upper body dressing   What is the patient wearing?: Pull over shirt    Upper  body assist Assist Level: Set up assist    Lower Body Dressing/Undressing Lower body dressing      What is the patient wearing?: Pants, Incontinence brief     Lower body assist Assist for lower body dressing: Moderate Assistance - Patient 50 - 74%     Toileting Toileting Toileting Activity did not occur (Clothing management and hygiene only): N/A (no void or bm)  Toileting assist Assist for toileting: Moderate Assistance - Patient 50 - 74% Assistive Device Comment: walker   Transfers Chair/bed transfer  Transfers assist     Chair/bed transfer assist level: Contact Guard/Touching assist     Locomotion Ambulation   Ambulation assist      Assist level: Supervision/Verbal cueing Assistive device:  Walker-rolling Max distance: 150'   Walk 10 feet activity   Assist     Assist level: Supervision/Verbal cueing Assistive device: Walker-rolling   Walk 50 feet activity   Assist    Assist level: Supervision/Verbal cueing Assistive device: Walker-rolling    Walk 150 feet activity   Assist    Assist level: Supervision/Verbal cueing Assistive device: Walker-rolling    Walk 10 feet on uneven surface  activity   Assist     Assist level: Contact Guard/Touching assist Assistive device: Walker-rolling   Wheelchair     Assist Is the patient using a wheelchair?: No             Wheelchair 50 feet with 2 turns activity    Assist            Wheelchair 150 feet activity     Assist          Blood pressure 128/68, pulse 90, temperature 98.4 F (36.9 C), resp. rate 14, height '5\' 8"'$  (1.727 m), weight 77.1 kg, SpO2 100 %.  Medical Problem List and Plan: 1. Functional deficits secondary to acute punctate infarct within the right cerebellar hemisphere, possible right frontal lobe infarct likely cardioembolic in the setting of paroxysmal atrial fibrillation             -patient may shower             -ELOS/Goals: 13 days             d/c home today 2.  Antithrombotics: -DVT/anticoagulation:  Pharmaceutical: Other (comment)Eliquis 2.5 mg daily             -antiplatelet therapy: aspirin 81 mg daily 3. Pain from sacral pressure injury: Continue Tylenol to 1,'000mg'$  scheduled TID and air mattress ordered. Kpad ordered for muscles of lower back. Repeat CMP tomorrow 4. Depressed mood: chaplain consulted 5. Neuropsych/cognition: This patient is capable of making decisions on his own behalf. 6. Gluteal fold Stage 2 pressure injury, unstageable skin injury over coccyx. Air mattress ordered. 7/1- encourage pt to turn q2 hours  7. Fluids/Electrolytes/Nutrition: Routine Is and Os and follow-up chemistries 8: Atrial fibrillation, paroxysmal:  -continue Lopressor  12.5 mg BID -continue Eliquis 9: Parkinson's disease: continue sinemet CR 10: DM-2: continue CBG monitoring and SSI             -was recently started on glipizide titrated up to 5 mg daily at home (not restarted) A1c = 7.9 on 6/9,   D/c meal coverage, d/c Ensure between meals  7/2- CBG's look good- con't regimen  7/4 well controlled CBG (last 3)  Recent Labs    03/21/22 1618 03/21/22 2056 03/22/22 0630  GLUCAP 167* 138* 122*    11: Hypertension: continue to monitor, BP overall well controlled,sometimes  a little soft             -continue Lopressor  7/2- BP slightly high to normal- con't regimen  7/4 well controlled Vitals:   03/21/22 2300 03/22/22 0426  BP: 120/68 128/68  Pulse:  90  Resp:  14  Temp:  98.4 F (36.9 C)  SpO2:  100%    12: CKD stage 3b: elevated BUN and creatinine: Baseline Cr 1.6 -2 follow-up chemistries and ensure adequate PO fluids. Cr improving, monitor weekly.    Latest Ref Rng & Units 03/22/2022    5:31 AM 03/20/2022    6:42 AM 03/16/2022    5:27 AM  BMP  Glucose 70 - 99 mg/dL 148  154  167   BUN 8 - 23 mg/dL 48  51  43   Creatinine 0.61 - 1.24 mg/dL 1.83  1.94  1.85   Sodium 135 - 145 mmol/L 139  144  143   Potassium 3.5 - 5.1 mmol/L 3.7  4.0  3.8   Chloride 98 - 111 mmol/L 105  109  111   CO2 22 - 32 mmol/L '24  27  25   '$ Calcium 8.9 - 10.3 mg/dL 8.3  8.7  8.4     13: Pulmonary plaques/LLL mass: follow-up as outpatient with pulmonology 14: Chronic pancreatic insufficiency, continue creon 15: BPH: continue Flomax and monitor for retention             --has external urine collection system; voiding trial 16: Hypothyroidism: continue Synthroid 25 mcg daily 18: SBO: resolved; now on regular diet             -last BM 6/22 19: recent Zoster to face 20: Anemia: hgb stable -HGB 8.7 continue to follow 21. RUL opacities, he was treated with ceftriaxone and flagyl 22. Lower extremity edema:Decrease lasix to '20mg'$  daily given AKI, placed nursing order to  elevate, ice and apply compression garments 23. Urinary retention: continue flomax to 0.'8mg'$   7/1- still requiring in/out caths- on max dose of Flomax- con't regimen 24. Rhinorrhea  -Loratadine ordered 25. Dry eyes  -Artifical tears prn  LOS: 13 days A FACE TO FACE EVALUATION WAS PERFORMED  Steven Ward 03/22/2022, 9:29 AM

## 2022-03-23 ENCOUNTER — Telehealth: Payer: Self-pay | Admitting: Family Medicine

## 2022-03-23 DIAGNOSIS — K219 Gastro-esophageal reflux disease without esophagitis: Secondary | ICD-10-CM | POA: Diagnosis not present

## 2022-03-23 DIAGNOSIS — G20 Parkinson's disease: Secondary | ICD-10-CM | POA: Diagnosis not present

## 2022-03-23 DIAGNOSIS — I69398 Other sequelae of cerebral infarction: Secondary | ICD-10-CM | POA: Diagnosis not present

## 2022-03-23 DIAGNOSIS — Z9181 History of falling: Secondary | ICD-10-CM | POA: Diagnosis not present

## 2022-03-23 DIAGNOSIS — E785 Hyperlipidemia, unspecified: Secondary | ICD-10-CM | POA: Diagnosis not present

## 2022-03-23 DIAGNOSIS — E039 Hypothyroidism, unspecified: Secondary | ICD-10-CM | POA: Diagnosis not present

## 2022-03-23 DIAGNOSIS — N400 Enlarged prostate without lower urinary tract symptoms: Secondary | ICD-10-CM | POA: Diagnosis not present

## 2022-03-23 DIAGNOSIS — I129 Hypertensive chronic kidney disease with stage 1 through stage 4 chronic kidney disease, or unspecified chronic kidney disease: Secondary | ICD-10-CM | POA: Diagnosis not present

## 2022-03-23 DIAGNOSIS — M47816 Spondylosis without myelopathy or radiculopathy, lumbar region: Secondary | ICD-10-CM | POA: Diagnosis not present

## 2022-03-23 DIAGNOSIS — E1151 Type 2 diabetes mellitus with diabetic peripheral angiopathy without gangrene: Secondary | ICD-10-CM | POA: Diagnosis not present

## 2022-03-23 DIAGNOSIS — N1832 Chronic kidney disease, stage 3b: Secondary | ICD-10-CM | POA: Diagnosis not present

## 2022-03-23 DIAGNOSIS — Z7984 Long term (current) use of oral hypoglycemic drugs: Secondary | ICD-10-CM | POA: Diagnosis not present

## 2022-03-23 DIAGNOSIS — K8681 Exocrine pancreatic insufficiency: Secondary | ICD-10-CM | POA: Diagnosis not present

## 2022-03-23 DIAGNOSIS — Z7901 Long term (current) use of anticoagulants: Secondary | ICD-10-CM | POA: Diagnosis not present

## 2022-03-23 DIAGNOSIS — R911 Solitary pulmonary nodule: Secondary | ICD-10-CM | POA: Diagnosis not present

## 2022-03-23 DIAGNOSIS — L89152 Pressure ulcer of sacral region, stage 2: Secondary | ICD-10-CM | POA: Diagnosis not present

## 2022-03-23 DIAGNOSIS — Z7982 Long term (current) use of aspirin: Secondary | ICD-10-CM | POA: Diagnosis not present

## 2022-03-23 DIAGNOSIS — Z87891 Personal history of nicotine dependence: Secondary | ICD-10-CM | POA: Diagnosis not present

## 2022-03-23 DIAGNOSIS — D631 Anemia in chronic kidney disease: Secondary | ICD-10-CM | POA: Diagnosis not present

## 2022-03-23 DIAGNOSIS — I480 Paroxysmal atrial fibrillation: Secondary | ICD-10-CM | POA: Diagnosis not present

## 2022-03-23 DIAGNOSIS — Z85038 Personal history of other malignant neoplasm of large intestine: Secondary | ICD-10-CM | POA: Diagnosis not present

## 2022-03-23 DIAGNOSIS — R531 Weakness: Secondary | ICD-10-CM | POA: Diagnosis not present

## 2022-03-23 DIAGNOSIS — E1122 Type 2 diabetes mellitus with diabetic chronic kidney disease: Secondary | ICD-10-CM | POA: Diagnosis not present

## 2022-03-23 NOTE — Telephone Encounter (Signed)
Pt's son requesting FL-2 form, going to be admitted tomorrow at 11am and will be there for a month. Requesting a call with an update

## 2022-03-24 ENCOUNTER — Telehealth: Payer: Self-pay | Admitting: Family Medicine

## 2022-03-24 DIAGNOSIS — I509 Heart failure, unspecified: Secondary | ICD-10-CM | POA: Diagnosis not present

## 2022-03-24 DIAGNOSIS — Z79899 Other long term (current) drug therapy: Secondary | ICD-10-CM | POA: Diagnosis not present

## 2022-03-24 NOTE — Telephone Encounter (Signed)
Pt son is calling and he is aware FL-2 may not be done by 11 am today. Pt will be going to [facility]  phone number [phone]. Pt son does not have the fax number. Pt son is going out of town today

## 2022-03-24 NOTE — Telephone Encounter (Signed)
Spoke with the patient's son and informed him the form faxed to [facility] at [phone] and a copy was sent to be scanned into the chart.

## 2022-03-24 NOTE — Telephone Encounter (Signed)
Steven Ward at [facility] is requesting this form be emailed if possible to get it there [place] , [email or web address]. pt's son was told he could not take patient to home before they received this form , son took patient anyway and they are awaiting his form.

## 2022-03-24 NOTE — Telephone Encounter (Signed)
See prior phone note. 

## 2022-03-24 NOTE — Telephone Encounter (Signed)
Pt 's son is requesting an update on the FL-2 previously called in. He states they have a bed for his father now and he does not / cannot lose this bed. As per son: Please address as soon as possible!!

## 2022-03-24 NOTE — Telephone Encounter (Signed)
Son calling in with the fax number for the facility for his father. [phone]. Would like an update as to when the form is completed

## 2022-03-25 DIAGNOSIS — Z79899 Other long term (current) drug therapy: Secondary | ICD-10-CM | POA: Diagnosis not present

## 2022-03-25 DIAGNOSIS — E039 Hypothyroidism, unspecified: Secondary | ICD-10-CM | POA: Diagnosis not present

## 2022-03-25 DIAGNOSIS — E440 Moderate protein-calorie malnutrition: Secondary | ICD-10-CM | POA: Diagnosis not present

## 2022-03-25 DIAGNOSIS — L89150 Pressure ulcer of sacral region, unstageable: Secondary | ICD-10-CM | POA: Diagnosis not present

## 2022-03-25 DIAGNOSIS — I69351 Hemiplegia and hemiparesis following cerebral infarction affecting right dominant side: Secondary | ICD-10-CM | POA: Diagnosis not present

## 2022-03-25 DIAGNOSIS — E1151 Type 2 diabetes mellitus with diabetic peripheral angiopathy without gangrene: Secondary | ICD-10-CM | POA: Diagnosis not present

## 2022-03-29 DIAGNOSIS — L89150 Pressure ulcer of sacral region, unstageable: Secondary | ICD-10-CM | POA: Diagnosis not present

## 2022-04-05 DIAGNOSIS — E039 Hypothyroidism, unspecified: Secondary | ICD-10-CM | POA: Diagnosis not present

## 2022-04-05 DIAGNOSIS — Z79899 Other long term (current) drug therapy: Secondary | ICD-10-CM | POA: Diagnosis not present

## 2022-04-05 DIAGNOSIS — L89150 Pressure ulcer of sacral region, unstageable: Secondary | ICD-10-CM | POA: Diagnosis not present

## 2022-04-10 NOTE — Progress Notes (Deleted)
Synopsis: Referred for RLL lung mass by [name], MD  Subjective:   PATIENT ID: Steven Ward GENDER: male DOB: 10/10/1924, MRN: [id number]  No chief complaint on file.  88yM with history of GERD, HTN, DM2, parkinsons, hypothyroid referred for pleural plaques and RLL lung mass  Otherwise pertinent review of systems is negative.  Past Medical History:  Diagnosis Date   ABNORMAL THYROID FUNCTION TESTS 01/17/2010   ALLERGIC RHINITIS 12/01/2008   Colon cancer ([place])    COLONIC POLYPS, HX OF 12/01/2008   DIABETES MELLITUS, TYPE II 12/01/2008   EDEMA LEG 05/04/2009   ESSENTIAL HYPERTENSION 12/01/2008   Exocrine pancreatic insufficiency    GALLSTONES 04/07/2009   GERD 12/01/2008   Hay fever    SPONDYLOSIS, LUMBAR 12/01/2008     Family History  Problem Relation Age of Onset   Cancer Other        colon   Diabetes Other    Stroke Other    Cancer Mother      Past Surgical History:  Procedure Laterality Date   ANKLE SURGERY Right    CATARACT EXTRACTION     CHOLECYSTECTOMY     COLON SURGERY     resection 1990 for cancer   LEG SURGERY Left     Social History   Socioeconomic History   Marital status: Widowed    Spouse name: Not on file   Number of children: 2   Years of education: Not on file   Highest education level: Not on file  Occupational History   Occupation: retired    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  Tobacco Use   Smoking status: Former    Packs/day: 1.00    Years: 20.00    Total pack years: 20.00    Types: Cigarettes    Quit date: 12/28/1968    Years since quitting: 53.3   Smokeless tobacco: Never  Vaping Use   Vaping Use: Never used  Substance and Sexual Activity   Alcohol use: No   Drug use: No   Sexual activity: Not on file  Other Topics Concern   Not on file  Social History Narrative   R handed   Lives with son    Social Determinants of Health   Financial Resource Strain: Low Risk  (05/25/2021)   Overall  Financial Resource Strain (CARDIA)    Difficulty of Paying Living Expenses: Not hard at all  Food Insecurity: No Food Insecurity (05/25/2021)   Hunger Vital Sign    Worried About Running Out of Food in the Last Year: Never true    [place] in the Last Year: Never true  Transportation Needs: No Transportation Needs (05/25/2021)   PRAPARE - Hydrologist (Medical): No    Lack of Transportation (Non-Medical): No  Physical Activity: Inactive (05/25/2021)   Exercise Vital Sign    Days of Exercise per Week: 0 days    Minutes of Exercise per Session: 0 min  Stress: No Stress Concern Present (05/25/2021)   [place]    Feeling of Stress : Not at all  Social Connections: Socially Isolated (05/25/2021)   Social Connection and Isolation Panel [NHANES]    Frequency of Communication with Friends and Family: Never    Frequency of Social Gatherings with Friends and Family: Once a week    Attends Religious Services: Never    Marine scientist or Organizations: No  Attends Archivist Meetings: Never    Marital Status: Widowed  Intimate Partner Violence: Not At Risk (05/25/2021)   Humiliation, Afraid, Rape, and Kick questionnaire    Fear of Current or Ex-Partner: No    Emotionally Abused: No    Physically Abused: No    Sexually Abused: No     Allergies  Allergen Reactions   Amoxicillin     REACTION: rash, dizziness     Outpatient Medications Prior to Visit  Medication Sig Dispense Refill   apixaban (ELIQUIS) 2.5 MG TABS tablet Take 1 tablet (2.5 mg total) by mouth 2 (two) times daily. 60 tablet 0   blood glucose meter kit and supplies KIT Dispense based on patient and insurance preference. Use up to four times daily as directed. (FOR ICD-9 250.00, 250.01). 1 each 0   Blood Glucose Monitoring Suppl (ONE TOUCH ULTRA 2) w/Device KIT Use as directed. 1 kit 0   Carbidopa-Levodopa ER (SINEMET  CR) 25-100 MG tablet controlled release Take 1 tablet by mouth 2 (two) times daily with breakfast and lunch. 60 tablet 0   Carbidopa-Levodopa ER (SINEMET CR) 25-100 MG tablet controlled release Take 1 tablet by mouth every evening. 60 tablet 0   collagenase (SANTYL) 250 UNIT/GM ointment Apply topically daily. 15 g 0   furosemide (LASIX) 20 MG tablet Take 1 tablet (20 mg total) by mouth daily. 30 tablet 0   glipiZIDE (GLUCOTROL) 5 MG tablet Take one half tablet by mouth once daily. (Patient taking differently: Take 5 mg by mouth daily before breakfast.) 60 tablet 3   glucose blood (ONETOUCH ULTRA) test strip USE TO TEST BLOOD SUGAR 3 TIMES A DAY 300 strip 1   Lancet Device MISC Use 1-4 times daily as needed or directed.  DX E11.9 1 each 3   Lancets (ONETOUCH ULTRASOFT) lancets Check 3 times daily. E11.9 300 each 3   levothyroxine (SYNTHROID) 25 MCG tablet Take 1 tablet (25 mcg total) by mouth daily. 30 tablet 0   loratadine (CLARITIN) 10 MG tablet Take 1 tablet (10 mg total) by mouth daily as needed for allergies or rhinitis.     magnesium oxide (MAG-OX) 400 (240 Mg) MG tablet Take 1 tablet (400 mg total) by mouth 2 (two) times daily. 60 tablet 0   metoprolol tartrate (LOPRESSOR) 25 MG tablet Take 0.5 tablets (12.5 mg total) by mouth 2 (two) times daily. 30 tablet 0   Multiple Vitamin (MULTIVITAMIN WITH MINERALS) TABS tablet Take 1 tablet by mouth daily.     ondansetron (ZOFRAN-ODT) 4 MG disintegrating tablet Take 1 tablet (4 mg total) by mouth every 8 (eight) hours as needed for nausea or vomiting. 20 tablet 0   polyethylene glycol (MIRALAX / GLYCOLAX) packet Take 17 g by mouth daily as needed for moderate constipation.     polyvinyl alcohol (LIQUIFILM TEARS) 1.4 % ophthalmic solution Place 2 drops into both eyes as needed for dry eyes (pleas eput at bedside for dry eyes). 15 mL 0   tamsulosin (FLOMAX) 0.4 MG CAPS capsule Take 2 capsules (0.8 mg total) by mouth daily after breakfast. 30 capsule 0    triamcinolone cream (KENALOG) 0.1 % APPLY  CREAM EXTERNALLY TO AFFECTED AREA TWICE DAILY AS NEEDED (Patient taking differently: 1 application. daily as needed (itching).) 80 g 2   No facility-administered medications prior to visit.       Objective:   Physical Exam:  General appearance: 88 y.o., male, NAD, conversant  Eyes: anicteric sclerae; PERRL, tracking appropriately HENT: NCAT;  MMM Neck: Trachea midline; no lymphadenopathy, no JVD Lungs: CTAB, no crackles, no wheeze, with normal respiratory effort CV: RRR, no murmur  Abdomen: Soft, non-tender; non-distended, BS present  Extremities: No peripheral edema, warm Skin: Normal turgor and texture; no rash Psych: Appropriate affect Neuro: Alert and oriented to person and place, no focal deficit     There were no vitals filed for this visit.   on *** LPM *** RA BMI Readings from Last 3 Encounters:  03/17/22 25.85 kg/m  03/09/22 27.29 kg/m  02/21/22 23.90 kg/m   Wt Readings from Last 3 Encounters:  03/17/22 170 lb (77.1 kg)  03/09/22 179 lb 7.3 oz (81.4 kg)  02/21/22 157 lb 3.2 oz (71.3 kg)     CBC    Component Value Date/Time   WBC 6.6 03/20/2022 0642   RBC 2.73 (L) 03/20/2022 0642   HGB 8.7 (L) 03/20/2022 0642   HCT 27.5 (L) 03/20/2022 0642   PLT 281 03/20/2022 0642   MCV 100.7 (H) 03/20/2022 0642   MCH 31.9 03/20/2022 0642   MCHC 31.6 03/20/2022 0642   RDW 16.1 (H) 03/20/2022 0642   LYMPHSABS 0.7 03/10/2022 0817   MONOABS 0.5 03/10/2022 0817   EOSABS 0.2 03/10/2022 0817   BASOSABS 0.0 03/10/2022 0817    ***  Chest Imaging: CT Chest with pleural plaques, RLL mass like consolidation and mucoid impaction, RUL bronchiolectasis  Pulmonary Functions Testing Results:     No data to display           Echocardiogram:   TTE 02/23/22:  1. Left ventricular ejection fraction, by estimation, is 55 to 60%. The  left ventricle has normal function. The left ventricle has no regional  wall motion  abnormalities. There is mild concentric left ventricular  hypertrophy. Left ventricular diastolic  function could not be evaluated.   2. Right ventricular systolic function is normal. The right ventricular  size is normal. There is normal pulmonary artery systolic pressure.   3. Left atrial size was severely dilated.   4. The mitral valve is normal in structure. Trivial mitral valve  regurgitation. No evidence of mitral stenosis.   5. The aortic valve is calcified. There is mild calcification of the  aortic valve. There is mild thickening of the aortic valve. Aortic valve  regurgitation is not visualized. No aortic stenosis is present.   6. The inferior vena cava is normal in size with greater than 50%  respiratory variability, suggesting right atrial pressure of 3 mmHg.   Heart Catheterization: ***    Assessment & Plan:    Plan:      Maryjane Hurter, MD [facility] 04/10/2022 2:13 PM

## 2022-04-11 ENCOUNTER — Inpatient Hospital Stay: Payer: PPO | Admitting: Student

## 2022-04-12 DIAGNOSIS — L89150 Pressure ulcer of sacral region, unstageable: Secondary | ICD-10-CM | POA: Diagnosis not present

## 2022-04-13 ENCOUNTER — Telehealth: Payer: Self-pay | Admitting: Pharmacist

## 2022-04-13 NOTE — Chronic Care Management (AMB) (Signed)
    Chronic Care Management Pharmacy Assistant   Name: Steven Ward  MRN: [id number] DOB: 01-Dec-1924  04/14/2022 APPOINTMENT REMINDER  Spoke with [name] daughter, patient is currently in respite care at [facility], they are unsure if he will be coming home.  Appointment with Jeni Salles, Clinical Pharmacist has been cancelled.   Care Gaps: AWV - scheduled 06/07/2022 Last BP - 110/60 on 02/08/2022 Last A1C - 7.9 on 02/24/2022 Shingrix - never done Pneumonia vaccine - overdue Eye exam - overdue Covid booster - overdue  Star Rating Drug: Glipizide 5 mg - last filled 02/08/2022 90 DS at [facility]  Any gaps in medications fill history? No  Gennie [facility]  Catering manager [phone]

## 2022-04-14 ENCOUNTER — Telehealth: Payer: PPO

## 2022-04-19 DIAGNOSIS — R059 Cough, unspecified: Secondary | ICD-10-CM | POA: Diagnosis not present

## 2022-04-19 DIAGNOSIS — L89150 Pressure ulcer of sacral region, unstageable: Secondary | ICD-10-CM | POA: Diagnosis not present

## 2022-04-24 ENCOUNTER — Ambulatory Visit: Payer: PPO | Admitting: Podiatry

## 2022-04-26 DIAGNOSIS — L89150 Pressure ulcer of sacral region, unstageable: Secondary | ICD-10-CM | POA: Diagnosis not present

## 2022-04-29 DIAGNOSIS — Z79899 Other long term (current) drug therapy: Secondary | ICD-10-CM | POA: Diagnosis not present

## 2022-04-29 DIAGNOSIS — E039 Hypothyroidism, unspecified: Secondary | ICD-10-CM | POA: Diagnosis not present

## 2022-04-30 DIAGNOSIS — D649 Anemia, unspecified: Secondary | ICD-10-CM | POA: Diagnosis not present

## 2022-04-30 DIAGNOSIS — E43 Unspecified severe protein-calorie malnutrition: Secondary | ICD-10-CM | POA: Diagnosis not present

## 2022-04-30 DIAGNOSIS — L89152 Pressure ulcer of sacral region, stage 2: Secondary | ICD-10-CM | POA: Diagnosis not present

## 2022-05-01 ENCOUNTER — Emergency Department (HOSPITAL_COMMUNITY): Payer: PPO

## 2022-05-01 ENCOUNTER — Inpatient Hospital Stay (HOSPITAL_COMMUNITY): Payer: PPO

## 2022-05-01 ENCOUNTER — Encounter (HOSPITAL_COMMUNITY): Payer: Self-pay

## 2022-05-01 ENCOUNTER — Inpatient Hospital Stay: Payer: Self-pay

## 2022-05-01 ENCOUNTER — Other Ambulatory Visit: Payer: Self-pay

## 2022-05-01 ENCOUNTER — Inpatient Hospital Stay (HOSPITAL_COMMUNITY)
Admission: EM | Admit: 2022-05-01 | Discharge: 2022-05-10 | DRG: 853 | Disposition: A | Discharge status: Skilled Nursing Facility | Payer: PPO | Source: Skilled Nursing Facility | Attending: Internal Medicine | Admitting: Internal Medicine

## 2022-05-01 DIAGNOSIS — Z7401 Bed confinement status: Secondary | ICD-10-CM | POA: Diagnosis not present

## 2022-05-01 DIAGNOSIS — M47816 Spondylosis without myelopathy or radiculopathy, lumbar region: Secondary | ICD-10-CM | POA: Diagnosis present

## 2022-05-01 DIAGNOSIS — I4821 Permanent atrial fibrillation: Secondary | ICD-10-CM | POA: Diagnosis present

## 2022-05-01 DIAGNOSIS — R918 Other nonspecific abnormal finding of lung field: Secondary | ICD-10-CM | POA: Diagnosis not present

## 2022-05-01 DIAGNOSIS — E039 Hypothyroidism, unspecified: Secondary | ICD-10-CM | POA: Diagnosis present

## 2022-05-01 DIAGNOSIS — I472 Ventricular tachycardia, unspecified: Secondary | ICD-10-CM | POA: Diagnosis present

## 2022-05-01 DIAGNOSIS — L89150 Pressure ulcer of sacral region, unstageable: Secondary | ICD-10-CM | POA: Diagnosis not present

## 2022-05-01 DIAGNOSIS — Z20822 Contact with and (suspected) exposure to covid-19: Secondary | ICD-10-CM | POA: Diagnosis present

## 2022-05-01 DIAGNOSIS — Z8673 Personal history of transient ischemic attack (TIA), and cerebral infarction without residual deficits: Secondary | ICD-10-CM

## 2022-05-01 DIAGNOSIS — K8689 Other specified diseases of pancreas: Secondary | ICD-10-CM | POA: Diagnosis present

## 2022-05-01 DIAGNOSIS — E440 Moderate protein-calorie malnutrition: Secondary | ICD-10-CM | POA: Diagnosis not present

## 2022-05-01 DIAGNOSIS — N1832 Chronic kidney disease, stage 3b: Secondary | ICD-10-CM | POA: Diagnosis not present

## 2022-05-01 DIAGNOSIS — E872 Acidosis, unspecified: Secondary | ICD-10-CM | POA: Diagnosis present

## 2022-05-01 DIAGNOSIS — Z823 Family history of stroke: Secondary | ICD-10-CM

## 2022-05-01 DIAGNOSIS — L89154 Pressure ulcer of sacral region, stage 4: Secondary | ICD-10-CM | POA: Diagnosis present

## 2022-05-01 DIAGNOSIS — K8020 Calculus of gallbladder without cholecystitis without obstruction: Secondary | ICD-10-CM | POA: Diagnosis present

## 2022-05-01 DIAGNOSIS — R4182 Altered mental status, unspecified: Secondary | ICD-10-CM | POA: Diagnosis not present

## 2022-05-01 DIAGNOSIS — R601 Generalized edema: Secondary | ICD-10-CM | POA: Diagnosis not present

## 2022-05-01 DIAGNOSIS — E1165 Type 2 diabetes mellitus with hyperglycemia: Secondary | ICD-10-CM | POA: Diagnosis present

## 2022-05-01 DIAGNOSIS — Z66 Do not resuscitate: Secondary | ICD-10-CM | POA: Diagnosis present

## 2022-05-01 DIAGNOSIS — G20 Parkinson's disease: Secondary | ICD-10-CM | POA: Diagnosis not present

## 2022-05-01 DIAGNOSIS — I959 Hypotension, unspecified: Secondary | ICD-10-CM | POA: Diagnosis not present

## 2022-05-01 DIAGNOSIS — H04123 Dry eye syndrome of bilateral lacrimal glands: Secondary | ICD-10-CM | POA: Diagnosis not present

## 2022-05-01 DIAGNOSIS — L89626 Pressure-induced deep tissue damage of left heel: Secondary | ICD-10-CM | POA: Diagnosis not present

## 2022-05-01 DIAGNOSIS — Z85038 Personal history of other malignant neoplasm of large intestine: Secondary | ICD-10-CM

## 2022-05-01 DIAGNOSIS — D62 Acute posthemorrhagic anemia: Secondary | ICD-10-CM | POA: Diagnosis not present

## 2022-05-01 DIAGNOSIS — M47817 Spondylosis without myelopathy or radiculopathy, lumbosacral region: Secondary | ICD-10-CM | POA: Diagnosis not present

## 2022-05-01 DIAGNOSIS — K8681 Exocrine pancreatic insufficiency: Secondary | ICD-10-CM | POA: Diagnosis present

## 2022-05-01 DIAGNOSIS — Z7901 Long term (current) use of anticoagulants: Secondary | ICD-10-CM

## 2022-05-01 DIAGNOSIS — L03312 Cellulitis of back [any part except buttock]: Secondary | ICD-10-CM | POA: Diagnosis not present

## 2022-05-01 DIAGNOSIS — S2231XA Fracture of one rib, right side, initial encounter for closed fracture: Secondary | ICD-10-CM | POA: Diagnosis not present

## 2022-05-01 DIAGNOSIS — Z79899 Other long term (current) drug therapy: Secondary | ICD-10-CM

## 2022-05-01 DIAGNOSIS — E119 Type 2 diabetes mellitus without complications: Secondary | ICD-10-CM | POA: Diagnosis not present

## 2022-05-01 DIAGNOSIS — T148XXA Other injury of unspecified body region, initial encounter: Secondary | ICD-10-CM | POA: Diagnosis not present

## 2022-05-01 DIAGNOSIS — L089 Local infection of the skin and subcutaneous tissue, unspecified: Secondary | ICD-10-CM

## 2022-05-01 DIAGNOSIS — E11649 Type 2 diabetes mellitus with hypoglycemia without coma: Secondary | ICD-10-CM | POA: Diagnosis not present

## 2022-05-01 DIAGNOSIS — D649 Anemia, unspecified: Secondary | ICD-10-CM

## 2022-05-01 DIAGNOSIS — J301 Allergic rhinitis due to pollen: Secondary | ICD-10-CM | POA: Diagnosis present

## 2022-05-01 DIAGNOSIS — I129 Hypertensive chronic kidney disease with stage 1 through stage 4 chronic kidney disease, or unspecified chronic kidney disease: Secondary | ICD-10-CM | POA: Diagnosis present

## 2022-05-01 DIAGNOSIS — Z6825 Body mass index (BMI) 25.0-25.9, adult: Secondary | ICD-10-CM

## 2022-05-01 DIAGNOSIS — E1122 Type 2 diabetes mellitus with diabetic chronic kidney disease: Secondary | ICD-10-CM | POA: Diagnosis not present

## 2022-05-01 DIAGNOSIS — Z87891 Personal history of nicotine dependence: Secondary | ICD-10-CM

## 2022-05-01 DIAGNOSIS — R6521 Severe sepsis with septic shock: Secondary | ICD-10-CM | POA: Diagnosis present

## 2022-05-01 DIAGNOSIS — K219 Gastro-esophageal reflux disease without esophagitis: Secondary | ICD-10-CM | POA: Diagnosis not present

## 2022-05-01 DIAGNOSIS — L89159 Pressure ulcer of sacral region, unspecified stage: Secondary | ICD-10-CM | POA: Diagnosis not present

## 2022-05-01 DIAGNOSIS — I480 Paroxysmal atrial fibrillation: Secondary | ICD-10-CM | POA: Diagnosis not present

## 2022-05-01 DIAGNOSIS — J90 Pleural effusion, not elsewhere classified: Secondary | ICD-10-CM | POA: Diagnosis not present

## 2022-05-01 DIAGNOSIS — R7889 Finding of other specified substances, not normally found in blood: Secondary | ICD-10-CM | POA: Diagnosis not present

## 2022-05-01 DIAGNOSIS — N179 Acute kidney failure, unspecified: Secondary | ICD-10-CM | POA: Diagnosis present

## 2022-05-01 DIAGNOSIS — E43 Unspecified severe protein-calorie malnutrition: Secondary | ICD-10-CM | POA: Diagnosis present

## 2022-05-01 DIAGNOSIS — E785 Hyperlipidemia, unspecified: Secondary | ICD-10-CM | POA: Diagnosis present

## 2022-05-01 DIAGNOSIS — I10 Essential (primary) hypertension: Secondary | ICD-10-CM | POA: Diagnosis not present

## 2022-05-01 DIAGNOSIS — R609 Edema, unspecified: Secondary | ICD-10-CM | POA: Diagnosis not present

## 2022-05-01 DIAGNOSIS — Z833 Family history of diabetes mellitus: Secondary | ICD-10-CM

## 2022-05-01 DIAGNOSIS — A419 Sepsis, unspecified organism: Secondary | ICD-10-CM | POA: Diagnosis not present

## 2022-05-01 DIAGNOSIS — Z880 Allergy status to penicillin: Secondary | ICD-10-CM

## 2022-05-01 DIAGNOSIS — R52 Pain, unspecified: Secondary | ICD-10-CM | POA: Diagnosis not present

## 2022-05-01 DIAGNOSIS — I700 Atherosclerosis of aorta: Secondary | ICD-10-CM | POA: Diagnosis present

## 2022-05-01 DIAGNOSIS — Z7984 Long term (current) use of oral hypoglycemic drugs: Secondary | ICD-10-CM

## 2022-05-01 DIAGNOSIS — K5900 Constipation, unspecified: Secondary | ICD-10-CM | POA: Diagnosis not present

## 2022-05-01 DIAGNOSIS — L89616 Pressure-induced deep tissue damage of right heel: Secondary | ICD-10-CM | POA: Diagnosis present

## 2022-05-01 DIAGNOSIS — R1310 Dysphagia, unspecified: Secondary | ICD-10-CM | POA: Diagnosis present

## 2022-05-01 DIAGNOSIS — Z809 Family history of malignant neoplasm, unspecified: Secondary | ICD-10-CM

## 2022-05-01 DIAGNOSIS — Z9049 Acquired absence of other specified parts of digestive tract: Secondary | ICD-10-CM | POA: Diagnosis not present

## 2022-05-01 DIAGNOSIS — R112 Nausea with vomiting, unspecified: Secondary | ICD-10-CM | POA: Diagnosis not present

## 2022-05-01 DIAGNOSIS — R319 Hematuria, unspecified: Secondary | ICD-10-CM | POA: Diagnosis present

## 2022-05-01 DIAGNOSIS — J984 Other disorders of lung: Secondary | ICD-10-CM | POA: Diagnosis not present

## 2022-05-01 DIAGNOSIS — Z0389 Encounter for observation for other suspected diseases and conditions ruled out: Secondary | ICD-10-CM | POA: Diagnosis not present

## 2022-05-01 DIAGNOSIS — R21 Rash and other nonspecific skin eruption: Secondary | ICD-10-CM | POA: Diagnosis not present

## 2022-05-01 DIAGNOSIS — Z86010 Personal history of colonic polyps: Secondary | ICD-10-CM

## 2022-05-01 DIAGNOSIS — Z79890 Hormone replacement therapy (postmenopausal): Secondary | ICD-10-CM

## 2022-05-01 DIAGNOSIS — E876 Hypokalemia: Secondary | ICD-10-CM | POA: Diagnosis present

## 2022-05-01 DIAGNOSIS — Z87828 Personal history of other (healed) physical injury and trauma: Secondary | ICD-10-CM | POA: Diagnosis not present

## 2022-05-01 LAB — COMPREHENSIVE METABOLIC PANEL
ALT: 10 U/L (ref 0–44)
AST: 45 U/L — ABNORMAL HIGH (ref 15–41)
Albumin: <1.5 g/dL — ABNORMAL LOW (ref 3.5–5.0)
Alkaline Phosphatase: 69 U/L (ref 38–126)
Anion gap: 6 (ref 5–15)
BUN: 71 mg/dL — ABNORMAL HIGH (ref 8–23)
CO2: 21 mmol/L — ABNORMAL LOW (ref 22–32)
Calcium: 7.7 mg/dL — ABNORMAL LOW (ref 8.9–10.3)
Chloride: 115 mmol/L — ABNORMAL HIGH (ref 98–111)
Creatinine, Ser: 2.1 mg/dL — ABNORMAL HIGH (ref 0.61–1.24)
GFR, Estimated: 28 mL/min — ABNORMAL LOW (ref >60)
Glucose, Bld: 195 mg/dL — ABNORMAL HIGH (ref 70–99)
Potassium: 3.5 mmol/L (ref 3.5–5.1)
Sodium: 142 mmol/L (ref 135–145)
Total Bilirubin: 0.5 mg/dL (ref 0.3–1.2)
Total Protein: 5 g/dL — ABNORMAL LOW (ref 6.5–8.1)

## 2022-05-01 LAB — CBC WITH DIFFERENTIAL/PLATELET
Abs Immature Granulocytes: 0.04 10*3/uL (ref 0.00–0.07)
Basophils Absolute: 0 10*3/uL (ref 0.0–0.1)
Basophils Relative: 0 %
Eosinophils Absolute: 0 10*3/uL (ref 0.0–0.5)
Eosinophils Relative: 0 %
HCT: 24.1 % — ABNORMAL LOW (ref 39.0–52.0)
Hemoglobin: 7.7 g/dL — ABNORMAL LOW (ref 13.0–17.0)
Immature Granulocytes: 1 %
Lymphocytes Relative: 16 %
Lymphs Abs: 1.2 10*3/uL (ref 0.7–4.0)
MCH: 32.4 pg (ref 26.0–34.0)
MCHC: 32 g/dL (ref 30.0–36.0)
MCV: 101.3 fL — ABNORMAL HIGH (ref 80.0–100.0)
Monocytes Absolute: 0.6 10*3/uL (ref 0.1–1.0)
Monocytes Relative: 9 %
Neutro Abs: 5.3 10*3/uL (ref 1.7–7.7)
Neutrophils Relative %: 74 %
Platelets: 240 10*3/uL (ref 150–400)
RBC: 2.38 MIL/uL — ABNORMAL LOW (ref 4.22–5.81)
RDW: 15.9 % — ABNORMAL HIGH (ref 11.5–15.5)
WBC: 7.1 10*3/uL (ref 4.0–10.5)
nRBC: 0 % (ref 0.0–0.2)

## 2022-05-01 LAB — URINALYSIS, ROUTINE W REFLEX MICROSCOPIC
Bilirubin Urine: NEGATIVE
Glucose, UA: NEGATIVE mg/dL
Ketones, ur: NEGATIVE mg/dL
Leukocytes,Ua: NEGATIVE
Nitrite: NEGATIVE
Protein, ur: 30 mg/dL — AB
RBC / HPF: >50 RBC/hpf — ABNORMAL HIGH (ref 0–5)
Specific Gravity, Urine: 1.011 (ref 1.005–1.030)
pH: 9 — ABNORMAL HIGH (ref 5.0–8.0)

## 2022-05-01 LAB — C-REACTIVE PROTEIN: CRP: 10 mg/dL — ABNORMAL HIGH (ref <1.0)

## 2022-05-01 LAB — PROTIME-INR
INR: 1.8 — ABNORMAL HIGH (ref 0.8–1.2)
Prothrombin Time: 20.9 seconds — ABNORMAL HIGH (ref 11.4–15.2)

## 2022-05-01 LAB — APTT: aPTT: 39 seconds — ABNORMAL HIGH (ref 24–36)

## 2022-05-01 LAB — MRSA NEXT GEN BY PCR, NASAL: MRSA by PCR Next Gen: DETECTED — AB

## 2022-05-01 LAB — RESP PANEL BY RT-PCR (FLU A&B, COVID) ARPGX2
Influenza A by PCR: NEGATIVE
Influenza B by PCR: NEGATIVE
SARS Coronavirus 2 by RT PCR: NEGATIVE

## 2022-05-01 LAB — LACTIC ACID, PLASMA
Lactic Acid, Venous: 3.4 mmol/L (ref 0.5–1.9)
Lactic Acid, Venous: 2.6 mmol/L (ref 0.5–1.9)

## 2022-05-01 LAB — GLUCOSE, CAPILLARY
Glucose-Capillary: 92 mg/dL (ref 70–99)
Glucose-Capillary: 159 mg/dL — ABNORMAL HIGH (ref 70–99)

## 2022-05-01 LAB — SEDIMENTATION RATE: Sed Rate: 65 mm/hr — ABNORMAL HIGH (ref 0–16)

## 2022-05-01 MED ORDER — VANCOMYCIN HCL 1250 MG/250ML IV SOLN
1250.0000 mg | INTRAVENOUS | Status: AC
  Administered 2022-05-03 – 2022-05-07 (×3): 1250 mg via INTRAVENOUS
  Filled 2022-05-01 (×3): qty 250

## 2022-05-01 MED ORDER — MUPIROCIN 2 % EX OINT
1.0000 | TOPICAL_OINTMENT | Freq: Two times a day (BID) | CUTANEOUS | Status: AC
  Administered 2022-05-02 – 2022-05-06 (×10): 1 via NASAL
  Filled 2022-05-01 (×2): qty 22

## 2022-05-01 MED ORDER — SODIUM CHLORIDE 0.9 % IV SOLN: 2.0000 g | Freq: Once | INTRAVENOUS | Status: DC

## 2022-05-01 MED ORDER — NOREPINEPHRINE 4 MG/250ML-% IV SOLN
2.0000 ug/min | INTRAVENOUS | Status: DC
  Administered 2022-05-01: 10 ug/min via INTRAVENOUS
  Filled 2022-05-01: qty 250

## 2022-05-01 MED ORDER — NOREPINEPHRINE 4 MG/250ML-% IV SOLN
0.0000 ug/min | INTRAVENOUS | Status: DC
  Administered 2022-05-01 – 2022-05-02 (×2): 14 ug/min via INTRAVENOUS
  Administered 2022-05-02: 20 ug/min via INTRAVENOUS
  Filled 2022-05-01 (×3): qty 250

## 2022-05-01 MED ORDER — DOCUSATE SODIUM 100 MG PO CAPS: 100.0000 mg | ORAL_CAPSULE | Freq: Two times a day (BID) | ORAL | Status: DC | PRN

## 2022-05-01 MED ORDER — HEPARIN (PORCINE) 25000 UT/250ML-% IV SOLN
1100.0000 [IU]/h | INTRAVENOUS | Status: DC
  Administered 2022-05-01: 1100 [IU]/h via INTRAVENOUS
  Filled 2022-05-01: qty 250

## 2022-05-01 MED ORDER — LACTATED RINGERS IV BOLUS
30.0000 mL/kg | Freq: Once | INTRAVENOUS | Status: AC
  Administered 2022-05-01: 2313 mL via INTRAVENOUS

## 2022-05-01 MED ORDER — ACETAMINOPHEN 325 MG PO TABS: 650.0000 mg | ORAL_TABLET | Freq: Four times a day (QID) | ORAL | Status: DC | PRN

## 2022-05-01 MED ORDER — SODIUM CHLORIDE 0.9% FLUSH: 10.0000 mL | INTRAVENOUS | Status: DC | PRN

## 2022-05-01 MED ORDER — CHLORHEXIDINE GLUCONATE CLOTH 2 % EX PADS
6.0000 | MEDICATED_PAD | Freq: Every day | CUTANEOUS | Status: DC
Start: 2022-05-01 — End: 2022-05-10
  Administered 2022-05-01 – 2022-05-10 (×10): 6 via TOPICAL

## 2022-05-01 MED ORDER — LEVOTHYROXINE SODIUM 25 MCG PO TABS
25.0000 ug | ORAL_TABLET | Freq: Every day | ORAL | Status: DC
  Administered 2022-05-02 – 2022-05-10 (×9): 25 ug via ORAL
  Filled 2022-05-01 (×9): qty 1

## 2022-05-01 MED ORDER — OXYCODONE HCL 5 MG PO TABS
5.0000 mg | ORAL_TABLET | Freq: Four times a day (QID) | ORAL | Status: DC | PRN
  Administered 2022-05-01 – 2022-05-07 (×7): 5 mg via ORAL
  Filled 2022-05-01 (×7): qty 1

## 2022-05-01 MED ORDER — LACTATED RINGERS IV SOLN: INTRAVENOUS | Status: AC

## 2022-05-01 MED ORDER — POLYETHYLENE GLYCOL 3350 17 G PO PACK: 17.0000 g | PACK | Freq: Every day | ORAL | Status: DC | PRN

## 2022-05-01 MED ORDER — INSULIN ASPART 100 UNIT/ML IJ SOLN
0.0000 [IU] | INTRAMUSCULAR | Status: DC
  Administered 2022-05-02 (×4): 2 [IU] via SUBCUTANEOUS

## 2022-05-01 MED ORDER — "THROMBI-PAD 3""X3"" EX PADS"
3.0000 | MEDICATED_PAD | CUTANEOUS | Status: AC | PRN
  Filled 2022-05-01: qty 2

## 2022-05-01 MED ORDER — VANCOMYCIN HCL IN DEXTROSE 1-5 GM/200ML-% IV SOLN: 1000.0000 mg | Freq: Once | INTRAVENOUS | Status: DC

## 2022-05-01 MED ORDER — SODIUM CHLORIDE 0.9 % IV SOLN
2.0000 g | INTRAVENOUS | Status: AC
  Administered 2022-05-01 – 2022-05-07 (×7): 2 g via INTRAVENOUS
  Filled 2022-05-01 (×9): qty 20

## 2022-05-01 MED ORDER — ONDANSETRON 4 MG PO TBDP: 4.0000 mg | ORAL_TABLET | Freq: Three times a day (TID) | ORAL | Status: DC | PRN

## 2022-05-01 MED ORDER — NOREPINEPHRINE 4 MG/250ML-% IV SOLN: 0.0000 ug/min | INTRAVENOUS | Status: DC

## 2022-05-01 MED ORDER — CARBIDOPA-LEVODOPA ER 25-100 MG PO TBCR
1.0000 | EXTENDED_RELEASE_TABLET | Freq: Two times a day (BID) | ORAL | Status: DC
  Administered 2022-05-02 – 2022-05-10 (×18): 1 via ORAL
  Filled 2022-05-01 (×20): qty 1

## 2022-05-01 MED ORDER — SODIUM CHLORIDE 0.9% FLUSH
10.0000 mL | Freq: Two times a day (BID) | INTRAVENOUS | Status: DC
  Administered 2022-05-01 – 2022-05-10 (×16): 10 mL

## 2022-05-01 MED ORDER — SODIUM CHLORIDE 0.9 % IV SOLN: 250.0000 mL | INTRAVENOUS | Status: DC

## 2022-05-01 MED ORDER — VANCOMYCIN HCL 1500 MG/300ML IV SOLN
1500.0000 mg | Freq: Once | INTRAVENOUS | Status: AC
  Administered 2022-05-01: 1500 mg via INTRAVENOUS
  Filled 2022-05-01: qty 300

## 2022-05-01 MED ORDER — SODIUM CHLORIDE 0.9% IV SOLUTION: Freq: Once | INTRAVENOUS | Status: DC

## 2022-05-01 MED ORDER — NOREPINEPHRINE 4 MG/250ML-% IV SOLN
0.0000 ug/min | INTRAVENOUS | Status: DC
  Administered 2022-05-01: 2 ug/min via INTRAVENOUS
  Filled 2022-05-01: qty 250

## 2022-05-01 MED ORDER — LACTATED RINGERS IV BOLUS (SEPSIS)
1000.0000 mL | Freq: Once | INTRAVENOUS | Status: DC
  Administered 2022-05-01: 1000 mL via INTRAVENOUS

## 2022-05-01 MED ORDER — CARBIDOPA-LEVODOPA ER 25-100 MG PO TBCR
1.0000 | EXTENDED_RELEASE_TABLET | Freq: Every evening | ORAL | Status: DC
  Administered 2022-05-01 – 2022-05-09 (×9): 1 via ORAL
  Filled 2022-05-01 (×11): qty 1

## 2022-05-01 NOTE — Progress Notes (Signed)
Discussed plan of care with patient's son Carmelo, pt is currently requiring Levophed 56mg and has a peripheral IV.  He would prefer that patient not get a CVC for his comfort but is ok with a PICC line.   Explained that we may not be able to get this overnight secondary to staffing and patient's creatinine.  He conformed that pt would not be interested in dialysis.   If we are not able to get a PICC overnight and pressor requirements are increasing he requests a phone call before a central line is placed.   LOtilio CarpenGleason, PA-C

## 2022-05-01 NOTE — Plan of Care (Signed)
  Interdisciplinary Goals of Care Family Meeting   Date carried out:: 05/01/2022  Location of the meeting: Bedside  Member's involved: Physician, Family Member or next of kin, and Other: Physician Assistant  Durable Power of Attorney or acting medical decision maker: Son, Steven Ward   Discussion: We discussed goals of care for Steven Ward .    Discussed Code Status and [place] with son and patient, confirmed DNR.  Son is ok with pressors and ICU admission and supportive care     Code status: Full DNR  Disposition: Continue current acute care   Time spent for the meeting: 15 minutes  Steven Ward 05/01/2022, 6:53 PM

## 2022-05-01 NOTE — ED Provider Notes (Addendum)
[facility] EMERGENCY DEPARTMENT Provider Note   CSN: [id number] Arrival date & time: 05/01/22  1135     History  Chief Complaint  Patient presents with  . Wound Check    Sacral Wound    Steven Ward is a 88 y.o. male. With pmh DM2, HTN, HLD, Parkinsons, CKD, thyroid disease, sacral decubitus ulcer, A fib on Eliquis brought in by EMS from [facility] for evaluation of sacral wound due to concern for foul smell and purulent discharge.  Patient was sent from his living facility today due to concerns for worsening changes of his chronic sacral decubitus ulcer.  His son at bedside says this is much worse than he has ever seen it.  It has had yellowish-green drainage and the size of the ulcer has increased.  When I speak to the patient he says he was sent here for infection of his bottom.  He denies any increased pain, abdominal pain, fevers, vomiting, diarrhea or pain with urination.  He does have an indwelling Foley catheter.  He has not been on antibiotics recently per son.  He has not had any hematochezia or melena or other bleeding.    Wound Check       Home Medications Prior to Admission medications   Medication Sig Start Date End Date Taking? Authorizing Provider  apixaban (ELIQUIS) 2.5 MG TABS tablet Take 1 tablet (2.5 mg total) by mouth 2 (two) times daily. 03/22/22   Setzer, Edman Circle, PA-C  blood glucose meter kit and supplies KIT Dispense based on patient and insurance preference. Use up to four times daily as directed. (FOR ICD-9 250.00, 250.01). 10/03/17   Burchette, Alinda Sierras, MD  Blood Glucose Monitoring Suppl (ONE TOUCH ULTRA 2) w/Device KIT Use as directed. 05/10/21   Burchette, Alinda Sierras, MD  Carbidopa-Levodopa ER (SINEMET CR) 25-100 MG tablet controlled release Take 1 tablet by mouth 2 (two) times daily with breakfast and lunch. 03/22/22   Setzer, Edman Circle, PA-C  Carbidopa-Levodopa ER (SINEMET CR) 25-100 MG tablet controlled release Take 1 tablet by mouth  every evening. 03/22/22   Setzer, Edman Circle, PA-C  collagenase (SANTYL) 250 UNIT/GM ointment Apply topically daily. 03/22/22   Setzer, Edman Circle, PA-C  furosemide (LASIX) 20 MG tablet Take 1 tablet (20 mg total) by mouth daily. 03/23/22   Setzer, Edman Circle, PA-C  glipiZIDE (GLUCOTROL) 5 MG tablet Take one half tablet by mouth once daily. Patient taking differently: Take 5 mg by mouth daily before breakfast. 02/08/22   Burchette, Alinda Sierras, MD  glucose blood (ONETOUCH ULTRA) test strip USE TO TEST BLOOD SUGAR 3 TIMES A DAY 01/25/22   Burchette, Alinda Sierras, MD  Lancet Device MISC Use 1-4 times daily as needed or directed.  DX E11.9 11/01/20   Burchette, Alinda Sierras, MD  Lancets [facility] ULTRASOFT) lancets Check 3 times daily. E11.9 11/30/20   Burchette, Alinda Sierras, MD  levothyroxine (SYNTHROID) 25 MCG tablet Take 1 tablet (25 mcg total) by mouth daily. 03/22/22   Setzer, Edman Circle, PA-C  loratadine (CLARITIN) 10 MG tablet Take 1 tablet (10 mg total) by mouth daily as needed for allergies or rhinitis. 03/22/22   Setzer, Edman Circle, PA-C  magnesium oxide (MAG-OX) 400 (240 Mg) MG tablet Take 1 tablet (400 mg total) by mouth 2 (two) times daily. 03/22/22   Setzer, Edman Circle, PA-C  metoprolol tartrate (LOPRESSOR) 25 MG tablet Take 0.5 tablets (12.5 mg total) by mouth 2 (two) times daily. 03/22/22   Risa Grill  J, PA-C  Multiple Vitamin (MULTIVITAMIN WITH MINERALS) TABS tablet Take 1 tablet by mouth daily. 03/23/22   Setzer, Edman Circle, PA-C  ondansetron (ZOFRAN-ODT) 4 MG disintegrating tablet Take 1 tablet (4 mg total) by mouth every 8 (eight) hours as needed for nausea or vomiting. 02/21/22   Burchette, Alinda Sierras, MD  polyethylene glycol (MIRALAX / Floria Raveling) packet Take 17 g by mouth daily as needed for moderate constipation.    [provider]  polyvinyl alcohol (LIQUIFILM TEARS) 1.4 % ophthalmic solution Place 2 drops into both eyes as needed for dry eyes (pleas eput at bedside for dry eyes). 03/22/22   Setzer, Edman Circle, PA-C  tamsulosin  (FLOMAX) 0.4 MG CAPS capsule Take 2 capsules (0.8 mg total) by mouth daily after breakfast. 03/23/22   Setzer, Edman Circle, PA-C  triamcinolone cream (KENALOG) 0.1 % APPLY  CREAM EXTERNALLY TO AFFECTED AREA TWICE DAILY AS NEEDED Patient taking differently: 1 application. daily as needed (itching). 11/28/19   Burchette, Alinda Sierras, MD      Allergies    Amoxicillin    Review of Systems   Review of Systems  Physical Exam Updated Vital Signs BP (!) 109/53   Pulse 88   Temp 97.8 F (36.6 C) (Oral)   Resp 14   Ht $R'5\' 8"'pN$  (1.727 m)   Wt 77.1 kg   SpO2 100%   BMI 25.84 kg/m  Physical Exam Constitutional: Alert and orientedx4, chronically ill-appearing but mentating appropriately, no acute distress Eyes: Conjunctivae are normal. ENT      Head: Normocephalic and atraumatic.      Nose: No congestion.      Mouth/Throat: Mucous membranes are dry.      Neck: No stridor. Cardiovascular: S1, S2, distal equal palpable DP pulses, irregularly irregular rhythm. Respiratory: Normal respiratory effort.  Rhonchi bilaterally, O2 sat 100 on RA  gastrointestinal: Soft and nontender. There is no CVA tenderness.  Large sacral decubitus ulcer as noted in media with extension to deeper subcutaneous tissue with surrounding erythema and warmth and purulent discharge, no crepitus, no tenderness to palpation Musculoskeletal: Normal range of motion in all extremities. Equal 3+ pitting edema extending from foot to knee bilaterally Neurologic: Normal speech and language. No gross focal neurologic deficits are appreciated. Skin: Large sacral decubitus ulcer as noted in GI exam and in media Psychiatric: Mood and affect are normal. Speech and behavior are normal.      ED Results / Procedures / Treatments   Labs (all labs ordered are listed, but only abnormal results are displayed) Labs Reviewed  LACTIC ACID, PLASMA - Abnormal; Notable for the following components:      Result Value   Lactic Acid, Venous 3.4 (*)     All other components within normal limits  COMPREHENSIVE METABOLIC PANEL - Abnormal; Notable for the following components:   Chloride 115 (*)    CO2 21 (*)    Glucose, Bld 195 (*)    BUN 71 (*)    Creatinine, Ser 2.10 (*)    Calcium 7.7 (*)    Total Protein 5.0 (*)    Albumin <1.5 (*)    AST 45 (*)    GFR, Estimated 28 (*)    All other components within normal limits  CBC WITH DIFFERENTIAL/PLATELET - Abnormal; Notable for the following components:   RBC 2.38 (*)    Hemoglobin 7.7 (*)    HCT 24.1 (*)    MCV 101.3 (*)    RDW 15.9 (*)    All other  components within normal limits  URINALYSIS, ROUTINE W REFLEX MICROSCOPIC - Abnormal; Notable for the following components:   APPearance CLOUDY (*)    pH 9.0 (*)    Hgb urine dipstick MODERATE (*)    Protein, ur 30 (*)    RBC / HPF >50 (*)    Bacteria, UA RARE (*)    All other components within normal limits  SEDIMENTATION RATE - Abnormal; Notable for the following components:   Sed Rate 65 (*)    All other components within normal limits  PROTIME-INR - Abnormal; Notable for the following components:   Prothrombin Time 20.9 (*)    INR 1.8 (*)    All other components within normal limits  APTT - Abnormal; Notable for the following components:   aPTT 39 (*)    All other components within normal limits  C-REACTIVE PROTEIN - Abnormal; Notable for the following components:   CRP 10.0 (*)    All other components within normal limits  RESP PANEL BY RT-PCR (FLU A&B, COVID) ARPGX2  CULTURE, BLOOD (ROUTINE X 2)  CULTURE, BLOOD (ROUTINE X 2)  URINE CULTURE  LACTIC ACID, PLASMA  CBC  BASIC METABOLIC PANEL  MAGNESIUM  PHOSPHORUS    EKG EKG Interpretation  Date/Time:  Monday May 01 2022 14:38:53 EDT Ventricular Rate:  74 PR Interval:    QRS Duration: 95 QT Interval:  413 QTC Calculation: 459 R Axis:   42 Text Interpretation: Atrial fibrillation Low voltage, extremity and precordial leads T wave changes inferior leads improved  from prior, T wave inversions lateral leads similar to previous. Confirmed by Georgina Snell [phone]) on 05/01/2022 2:57:49 PM  Radiology CT PELVIS WO CONTRAST  Result Date: 05/01/2022 CLINICAL DATA:  Sacral decubitus ulcer, further evaluate for abscess or deep infection EXAM: CT PELVIS WITHOUT CONTRAST TECHNIQUE: Multidetector CT imaging of the pelvis was performed following the standard protocol without intravenous contrast. RADIATION DOSE REDUCTION: This exam was performed according to the departmental dose-optimization program which includes automated exposure control, adjustment of the mA and/or kV according to patient size and/or use of iterative reconstruction technique. COMPARISON:  CT chest abdomen pelvis, 02/22/2022 FINDINGS: Urinary Tract:  Urinary bladder decompressed by Foley catheter. Bowel: Evidence of prior rectosigmoid colon resection and reanastomosis. Large burden of stool in the included colon. Vascular/Lymphatic: No pathologically enlarged lymph nodes. Aortic atherosclerosis Reproductive:  No mass or other significant abnormality Other:  Anasarca. Musculoskeletal: No suspicious bone lesions identified. Sacral decubitus ulceration with tunneling subcutaneous air and fluid collection measuring at least 7.6 x 3.8 x 2.4 cm (series 3, image 35, series 6, image 98). Overlying dressing material. No significant bony erosion or sclerosis of the underlying sacrum or coccyx. IMPRESSION: 1. Sacral decubitus ulceration with tunneling subcutaneous air and fluid collection measuring at least 7.6 x 3.8 x 2.4 cm. Overlying dressing material. 2. No significant bony erosion or sclerosis of the underlying sacrum or coccyx. MRI is the test of choice for the evaluation of osteomyelitis if suspected. 3. Anasarca. 4. Evidence of prior rectosigmoid colon resection and reanastomosis. Large burden of stool in the included colon. Aortic Atherosclerosis (ICD10-I70.0). Electronically Signed   By: Delanna Ahmadi M.D.    On: 05/01/2022 16:01   DG Chest Port 1 View  Result Date: 05/01/2022 CLINICAL DATA:  Questionable sepsis. Sacral wound limits positioning by report from the technologists. EXAM: PORTABLE CHEST 1 VIEW COMPARISON:  February 28, 2022 FINDINGS: Lung volumes are diminished compared to previous imaging and images rotated slightly to the RIGHT. Accounting  for this cardiomediastinal contours and hilar structures are stable. There is graded opacity at the RIGHT and LEFT lung base that is increased since previous imaging. Added density over the LEFT chest is likely due in part to overlapping soft tissues. No lobar consolidative process. On limited assessment no acute skeletal findings. IMPRESSION: 1. Increased density over the LEFT chest favored to represent overlapping soft tissues. Lung volumes are diminished compared to previous imaging with potential increase in pleural fluid since the prior study. 2. No signs of lobar consolidation. Electronically Signed   By: Zetta Bills M.D.   On: 05/01/2022 13:32    Procedures .Critical Care  Performed by: Elgie Congo, MD Authorized by: Elgie Congo, MD   Critical care provider statement:    Critical care time (minutes):  30   Critical care was necessary to treat or prevent imminent or life-threatening deterioration of the following conditions:  Sepsis and shock   Critical care was time spent personally by me on the following activities:  Development of treatment plan with patient or surrogate, discussions with consultants, evaluation of patient's response to treatment, examination of patient, ordering and review of laboratory studies, ordering and review of radiographic studies, ordering and performing treatments and interventions, pulse oximetry, re-evaluation of patient's condition, review of old charts and obtaining history from patient or surrogate   Care discussed with: admitting provider     Remain on constant cardiac monitoring, atrial  fibrillation  Medications Ordered in ED Medications  lactated ringers infusion (has no administration in time range)  cefTRIAXone (ROCEPHIN) 2 g in sodium chloride 0.9 % 100 mL IVPB (0 g Intravenous Stopped 05/01/22 1456)  vancomycin (VANCOREADY) IVPB 1250 mg/250 mL (has no administration in time range)  norepinephrine (LEVOPHED) 4mg  in 242mL (0.016 mg/mL) premix infusion (2 mcg/min Intravenous New Bag/Given 05/01/22 1542)  ondansetron (ZOFRAN-ODT) disintegrating tablet 4 mg (has no administration in time range)  levothyroxine (SYNTHROID) tablet 25 mcg (has no administration in time range)  Carbidopa-Levodopa ER (SINEMET CR) 25-100 MG tablet controlled release 1 tablet (has no administration in time range)  Carbidopa-Levodopa ER (SINEMET CR) 25-100 MG tablet controlled release 1 tablet (has no administration in time range)  docusate sodium (COLACE) capsule 100 mg (has no administration in time range)  polyethylene glycol (MIRALAX / GLYCOLAX) packet 17 g (has no administration in time range)  lactated ringers bolus 2,313 mL (2,313 mLs Intravenous New Bag/Given 05/01/22 1300)  vancomycin (VANCOREADY) IVPB 1500 mg/300 mL (0 mg Intravenous Stopped 05/01/22 1653)    ED Course/ Medical Decision Making/ A&P Clinical Course as of 05/01/22 1715  Mon May 01, 2022  1341 Patient's labs remarkable for macrocytic anemia hemoglobin 7.7 down from baseline of 9, no melena, hematochezia or other bleeding other than mild bleeding from sacral decubitus wound.  Will obtain type and screen and coags, hold off on transfusion at this time but will likely need to hold Eliquis.  Also has AKI creatinine 2.1 up from baseline 1.8.  Chest x-ray with increased interstitial fluid, no hypoxia or oxygen requirement at this time.  We will continue sepsis treatment. [VB]  1427 Lactic Acid, Venous(!!): 3.4 [VB]  1427 Sed Rate(!): 65 [VB]  1427 CRP(!): 10.0 [VB]  6948 Patient's presentation concerning for septic shock likely due to  a sacral decubitus ulcer and concern for underlying abscess as noted in impression on CT scan.  Also noted to have lactate of 3.4 with elevated inflammatory markers ESR 65.  Creatinine 2.1 AKI from baseline 1.9.  Paged intensivist for admission due to shock requiring norepi gtt as well as general surgery for consult regarding concern for sacral decubitus wound requiring washout.  IMPRESSION: 1. Sacral decubitus ulceration with tunneling subcutaneous air and fluid collection measuring at least 7.6 x 3.8 x 2.4 cm. Overlying dressing material. 2. No significant bony erosion or sclerosis of the underlying sacrum or coccyx. MRI is the test of choice for the evaluation of osteomyelitis if suspected. 3. Anasarca. 4. Evidence of prior rectosigmoid colon resection and reanastomosis. Large burden of stool in the included colon.  Aortic Atherosclerosis (ICD10-I70.0).   [VB]  1627 Spoke with general surgery Dr Kae Heller regarding sacral decubitus ulcer.  He says it is unlikely he will require OR for washout.  He will review case. [VB]    Clinical Course User Index [VB] Elgie Congo, MD                           Medical Decision Making Steven Ward is a 88 y.o. male. With pmh DM2, HTN, HLD, Parkinsons, CKD, thyroid disease, sacral decubitus ulcer brought in by EMS from [facility] for evaluation of sacral wound due to concern for foul smell and purulent discharge.  Patient presents with softer blood pressures initial BP 97/48 with maps in the 50s and large sacral decubitus ulcer that is probed down to subcutaneous tissue with foul-smelling discharge concerning for sepsis secondary to sacral decubitus ulcer and likely underlying osteomyelitis.  Will obtain sepsis work-up including blood cultures, UA, chest x-ray and CT scan of the pelvis to further evaluate for underlying deep abscess. Will replace indwelling foley for new urine sample.  Will cover patient with broad-spectrum antibiotics and  IVF.most recent echocardiogram reassuring LVEF and RV function.  Plan for admission for continued fluids and antibiotics.  Amount and/or Complexity of Data Reviewed Labs: ordered. Decision-making details documented in ED Course. Radiology: ordered and independent interpretation performed. Decision-making details documented in ED Course.    Details: Fluid collection at site of sacral decubitus ulcer ECG/medicine tests: ordered and independent interpretation performed. Decision-making details documented in ED Course.  Risk Prescription drug management. Decision regarding hospitalization.     Final Clinical Impression(s) / ED Diagnoses Final diagnoses:  Sepsis, due to unspecified organism, unspecified whether acute organ dysfunction present ([place])  Anemia, unspecified type  AKI (acute kidney injury) ([place])  Wound infection    Rx / DC Orders ED Discharge Orders     None         Elgie Congo, MD 05/01/22 1654    Elgie Congo, MD 05/01/22 1715

## 2022-05-01 NOTE — Progress Notes (Signed)
Notified bedside nurse of need to draw repeat lactic acid. 

## 2022-05-01 NOTE — Consult Note (Signed)
Reason for Consult:decub Referring Physician: Dr Val Riles is an 88 y.o. male.  HPI: 89 yom with dm, htn, parkinsons, ckd, recent stroke on Eliquis who presents with ams and wound that has been present for some time.  This has gotten larger.  He was found to by hypotensive as well.  He has no fever, ab pain, or other symptoms but not sure this is all reliable. He had a ct scan and I was called.   Past Medical History:  Diagnosis Date   ABNORMAL THYROID FUNCTION TESTS 01/17/2010   ALLERGIC RHINITIS 12/01/2008   Colon cancer ([place])    COLONIC POLYPS, HX OF 12/01/2008   DIABETES MELLITUS, TYPE II 12/01/2008   EDEMA LEG 05/04/2009   ESSENTIAL HYPERTENSION 12/01/2008   Exocrine pancreatic insufficiency    GALLSTONES 04/07/2009   GERD 12/01/2008   Hay fever    SPONDYLOSIS, LUMBAR 12/01/2008    Past Surgical History:  Procedure Laterality Date   ANKLE SURGERY Right    CATARACT EXTRACTION     CHOLECYSTECTOMY     COLON SURGERY     resection 1990 for cancer   LEG SURGERY Left     Family History  Problem Relation Age of Onset   Cancer Other        colon   Diabetes Other    Stroke Other    Cancer Mother     Social History:  reports that he quit smoking about 53 years ago. His smoking use included cigarettes. He has a 20.00 pack-year smoking history. He has never used smokeless tobacco. He reports that he does not drink alcohol and does not use drugs.  Allergies:  Allergies  Allergen Reactions   Amoxicillin     REACTION: rash, dizziness    Medications: I have reviewed the patient's current medications. No current facility-administered medications on file prior to encounter.   Current Outpatient Medications on File Prior to Encounter  Medication Sig Dispense Refill   apixaban (ELIQUIS) 2.5 MG TABS tablet Take 1 tablet (2.5 mg total) by mouth 2 (two) times daily. 60 tablet 0   blood glucose meter kit and supplies KIT Dispense based on patient and insurance preference.  Use up to four times daily as directed. (FOR ICD-9 250.00, 250.01). 1 each 0   Blood Glucose Monitoring Suppl (ONE TOUCH ULTRA 2) w/Device KIT Use as directed. 1 kit 0   Carbidopa-Levodopa ER (SINEMET CR) 25-100 MG tablet controlled release Take 1 tablet by mouth 2 (two) times daily with breakfast and lunch. 60 tablet 0   Carbidopa-Levodopa ER (SINEMET CR) 25-100 MG tablet controlled release Take 1 tablet by mouth every evening. 60 tablet 0   collagenase (SANTYL) 250 UNIT/GM ointment Apply topically daily. 15 g 0   furosemide (LASIX) 20 MG tablet Take 1 tablet (20 mg total) by mouth daily. 30 tablet 0   glipiZIDE (GLUCOTROL) 5 MG tablet Take one half tablet by mouth once daily. (Patient taking differently: Take 5 mg by mouth daily before breakfast.) 60 tablet 3   glucose blood (ONETOUCH ULTRA) test strip USE TO TEST BLOOD SUGAR 3 TIMES A DAY 300 strip 1   Lancet Device MISC Use 1-4 times daily as needed or directed.  DX E11.9 1 each 3   Lancets (ONETOUCH ULTRASOFT) lancets Check 3 times daily. E11.9 300 each 3   levothyroxine (SYNTHROID) 25 MCG tablet Take 1 tablet (25 mcg total) by mouth daily. 30 tablet 0   loratadine (CLARITIN) 10 MG tablet Take 1  tablet (10 mg total) by mouth daily as needed for allergies or rhinitis.     magnesium oxide (MAG-OX) 400 (240 Mg) MG tablet Take 1 tablet (400 mg total) by mouth 2 (two) times daily. 60 tablet 0   metoprolol tartrate (LOPRESSOR) 25 MG tablet Take 0.5 tablets (12.5 mg total) by mouth 2 (two) times daily. 30 tablet 0   Multiple Vitamin (MULTIVITAMIN WITH MINERALS) TABS tablet Take 1 tablet by mouth daily.     ondansetron (ZOFRAN-ODT) 4 MG disintegrating tablet Take 1 tablet (4 mg total) by mouth every 8 (eight) hours as needed for nausea or vomiting. 20 tablet 0   polyethylene glycol (MIRALAX / GLYCOLAX) packet Take 17 g by mouth daily as needed for moderate constipation.     polyvinyl alcohol (LIQUIFILM TEARS) 1.4 % ophthalmic solution Place 2 drops  into both eyes as needed for dry eyes (pleas eput at bedside for dry eyes). 15 mL 0   tamsulosin (FLOMAX) 0.4 MG CAPS capsule Take 2 capsules (0.8 mg total) by mouth daily after breakfast. 30 capsule 0   triamcinolone cream (KENALOG) 0.1 % APPLY  CREAM EXTERNALLY TO AFFECTED AREA TWICE DAILY AS NEEDED (Patient taking differently: 1 application. daily as needed (itching).) 80 g 2    Results for orders placed or performed during the hospital encounter of 05/01/22 (from the past 48 hour(s))  Resp Panel by RT-PCR (Flu A&B, Covid) Anterior Nasal Swab     Status: None   Collection Time: 05/01/22 12:40 PM   Specimen: Anterior Nasal Swab  Result Value Ref Range   SARS Coronavirus 2 by RT PCR NEGATIVE NEGATIVE    Comment: (NOTE) SARS-CoV-2 target nucleic acids are NOT DETECTED.  The SARS-CoV-2 RNA is generally detectable in upper respiratory specimens during the acute phase of infection. The lowest concentration of SARS-CoV-2 viral copies this assay can detect is 138 copies/mL. A negative result does not preclude SARS-Cov-2 infection and should not be used as the sole basis for treatment or other patient management decisions. A negative result may occur with  improper specimen collection/handling, submission of specimen other than nasopharyngeal swab, presence of viral mutation(s) within the areas targeted by this assay, and inadequate number of viral copies(<138 copies/mL). A negative result must be combined with clinical observations, patient history, and epidemiological information. The expected result is Negative.  Fact Sheet for Patients:  EntrepreneurPulse.com.au  Fact Sheet for Healthcare Providers:  IncredibleEmployment.be  This test is no t yet approved or cleared by the [place] FDA and  has been authorized for detection and/or diagnosis of SARS-CoV-2 by FDA under an Emergency Use Authorization (EUA). This EUA will remain  in effect  (meaning this test can be used) for the duration of the COVID-19 declaration under Section 564(b)(1) of the Act, 21 U.S.C.section 360bbb-3(b)(1), unless the authorization is terminated  or revoked sooner.       Influenza A by PCR NEGATIVE NEGATIVE   Influenza B by PCR NEGATIVE NEGATIVE    Comment: (NOTE) The Xpert Xpress SARS-CoV-2/FLU/RSV plus assay is intended as an aid in the diagnosis of influenza from Nasopharyngeal swab specimens and should not be used as a sole basis for treatment. Nasal washings and aspirates are unacceptable for Xpert Xpress SARS-CoV-2/FLU/RSV testing.  Fact Sheet for Patients: EntrepreneurPulse.com.au  Fact Sheet for Healthcare Providers: IncredibleEmployment.be  This test is not yet approved or cleared by the [place] FDA and has been authorized for detection and/or diagnosis of SARS-CoV-2 by FDA under an Emergency Use Authorization (EUA).  This EUA will remain in effect (meaning this test can be used) for the duration of the COVID-19 declaration under Section 564(b)(1) of the Act, 21 U.S.C. section 360bbb-3(b)(1), unless the authorization is terminated or revoked.  Performed at [facility], [place] [address] [place], [place] [address]   Lactic acid, plasma     Status: Abnormal   Collection Time: 05/01/22 12:44 PM  Result Value Ref Range   Lactic Acid, Venous 3.4 (HH) 0.5 - 1.9 mmol/L    Comment: CRITICAL RESULT CALLED TO, READ BACK BY AND VERIFIED WITH C.ORTEGA,RN $RemoveBeforeDE'@[email or web address]'[id number]$  05/01/2022 [email or web address] Performed at [facility], [place] [address] [place], [place] [address]   Comprehensive metabolic panel     Status: Abnormal   Collection Time: 05/01/22 12:44 PM  Result Value Ref Range   Sodium 142 135 - 145 mmol/L   Potassium 3.5 3.5 - 5.1 mmol/L   Chloride 115 (H) 98 - 111 mmol/L   CO2 21 (L) 22 - 32 mmol/L   Glucose, Bld 195 (H) 70 - 99 mg/dL    Comment: Glucose reference range applies only to samples  taken after fasting for at least 8 hours.   BUN 71 (H) 8 - 23 mg/dL   Creatinine, Ser 2.10 (H) 0.61 - 1.24 mg/dL   Calcium 7.7 (L) 8.9 - 10.3 mg/dL   Total Protein 5.0 (L) 6.5 - 8.1 g/dL   Albumin <1.5 (L) 3.5 - 5.0 g/dL   AST 45 (H) 15 - 41 U/L   ALT 10 0 - 44 U/L   Alkaline Phosphatase 69 38 - 126 U/L   Total Bilirubin 0.5 0.3 - 1.2 mg/dL   GFR, Estimated 28 (L) >60 mL/min    Comment: (NOTE) Calculated using the CKD-EPI Creatinine Equation (2021)    Anion gap 6 5 - 15    Comment: Performed at [facility], [place] [address] [place], [place] [address]  CBC with Differential     Status: Abnormal   Collection Time: 05/01/22 12:44 PM  Result Value Ref Range   WBC 7.1 4.0 - 10.5 K/uL   RBC 2.38 (L) 4.22 - 5.81 MIL/uL   Hemoglobin 7.7 (L) 13.0 - 17.0 g/dL   HCT 24.1 (L) 39.0 - 52.0 %   MCV 101.3 (H) 80.0 - 100.0 fL   MCH 32.4 26.0 - 34.0 pg   MCHC 32.0 30.0 - 36.0 g/dL   RDW 15.9 (H) 11.5 - 15.5 %   Platelets 240 150 - 400 K/uL   nRBC 0.0 0.0 - 0.2 %   Neutrophils Relative % 74 %   Neutro Abs 5.3 1.7 - 7.7 K/uL   Lymphocytes Relative 16 %   Lymphs Abs 1.2 0.7 - 4.0 K/uL   Monocytes Relative 9 %   Monocytes Absolute 0.6 0.1 - 1.0 K/uL   Eosinophils Relative 0 %   Eosinophils Absolute 0.0 0.0 - 0.5 K/uL   Basophils Relative 0 %   Basophils Absolute 0.0 0.0 - 0.1 K/uL   Immature Granulocytes 1 %   Abs Immature Granulocytes 0.04 0.00 - 0.07 K/uL    Comment: Performed at [facility], [place] [address], [place], [place] [address]  Sedimentation rate     Status: Abnormal   Collection Time: 05/01/22 12:44 PM  Result Value Ref Range   Sed Rate 65 (H) 0 - 16 mm/hr    Comment: Performed at [place] [address] [place], [place] [address]  Protime-INR  Status: Abnormal   Collection Time: 05/01/22  1:00 PM  Result Value Ref Range   Prothrombin Time 20.9 (H) 11.4 - 15.2 seconds   INR 1.8 (H) 0.8 - 1.2    Comment: (NOTE) INR goal varies based on device and  disease states. Performed at [facility], [address] [place], [place] [address]   APTT     Status: Abnormal   Collection Time: 05/01/22  1:00 PM  Result Value Ref Range   aPTT 39 (H) 24 - 36 seconds    Comment:        IF BASELINE aPTT IS ELEVATED, SUGGEST PATIENT RISK ASSESSMENT BE USED TO DETERMINE APPROPRIATE ANTICOAGULANT THERAPY. Performed at [facility], [address] [place], [place] [address]   C-reactive protein     Status: Abnormal   Collection Time: 05/01/22  1:00 PM  Result Value Ref Range   CRP 10.0 (H) <1.0 mg/dL    Comment: Performed at [facility], [address], [place], [place] [address]  Urinalysis, Routine w reflex microscopic     Status: Abnormal   Collection Time: 05/01/22  3:45 PM  Result Value Ref Range   Color, Urine YELLOW YELLOW   APPearance CLOUDY (A) CLEAR   Specific Gravity, Urine 1.011 1.005 - 1.030   pH 9.0 (H) 5.0 - 8.0   Glucose, UA NEGATIVE NEGATIVE mg/dL   Hgb urine dipstick MODERATE (A) NEGATIVE   Bilirubin Urine NEGATIVE NEGATIVE   Ketones, ur NEGATIVE NEGATIVE mg/dL   Protein, ur 30 (A) NEGATIVE mg/dL   Nitrite NEGATIVE NEGATIVE   Leukocytes,Ua NEGATIVE NEGATIVE   RBC / HPF >50 (H) 0 - 5 RBC/hpf   WBC, UA 6-10 0 - 5 WBC/hpf   Bacteria, UA RARE (A) NONE SEEN   Mucus PRESENT    Hyaline Casts, UA PRESENT    Triple Phosphate Crystal PRESENT     Comment: Performed at [facility], [address], [place], [place] [address]    CT PELVIS WO CONTRAST  Result Date: 05/01/2022 CLINICAL DATA:  Sacral decubitus ulcer, further evaluate for abscess or deep infection EXAM: CT PELVIS WITHOUT CONTRAST TECHNIQUE: Multidetector CT imaging of the pelvis was performed following the standard protocol without intravenous contrast. RADIATION DOSE REDUCTION: This exam was performed according to the departmental dose-optimization program which includes automated exposure control, adjustment of the mA and/or kV according to  patient size and/or use of iterative reconstruction technique. COMPARISON:  CT chest abdomen pelvis, 02/22/2022 FINDINGS: Urinary Tract:  Urinary bladder decompressed by Foley catheter. Bowel: Evidence of prior rectosigmoid colon resection and reanastomosis. Large burden of stool in the included colon. Vascular/Lymphatic: No pathologically enlarged lymph nodes. Aortic atherosclerosis Reproductive:  No mass or other significant abnormality Other:  Anasarca. Musculoskeletal: No suspicious bone lesions identified. Sacral decubitus ulceration with tunneling subcutaneous air and fluid collection measuring at least 7.6 x 3.8 x 2.4 cm (series 3, image 35, series 6, image 98). Overlying dressing material. No significant bony erosion or sclerosis of the underlying sacrum or coccyx. IMPRESSION: 1. Sacral decubitus ulceration with tunneling subcutaneous air and fluid collection measuring at least 7.6 x 3.8 x 2.4 cm. Overlying dressing material. 2. No significant bony erosion or sclerosis of the underlying sacrum or coccyx. MRI is the test of choice for the evaluation of osteomyelitis if suspected. 3. Anasarca. 4. Evidence of prior rectosigmoid colon resection and reanastomosis. Large burden of stool in the included colon. Aortic Atherosclerosis (ICD10-I70.0). Electronically Signed  By: Delanna Ahmadi M.D.   On: 05/01/2022 16:01   DG Chest Port 1 View  Result Date: 05/01/2022 CLINICAL DATA:  Questionable sepsis. Sacral wound limits positioning by report from the technologists. EXAM: PORTABLE CHEST 1 VIEW COMPARISON:  February 28, 2022 FINDINGS: Lung volumes are diminished compared to previous imaging and images rotated slightly to the RIGHT. Accounting for this cardiomediastinal contours and hilar structures are stable. There is graded opacity at the RIGHT and LEFT lung base that is increased since previous imaging. Added density over the LEFT chest is likely due in part to overlapping soft tissues. No lobar consolidative  process. On limited assessment no acute skeletal findings. IMPRESSION: 1. Increased density over the LEFT chest favored to represent overlapping soft tissues. Lung volumes are diminished compared to previous imaging with potential increase in pleural fluid since the prior study. 2. No signs of lobar consolidation. Electronically Signed   By: Zetta Bills M.D.   On: 05/01/2022 13:32    Review of Systems  Unable to perform ROS: Acuity of condition   Blood pressure (!) 109/53, pulse 88, temperature 97.8 F (36.6 C), temperature source Oral, resp. rate 14, height $RemoveBe'5\' 8"'uptmlznse$  (1.727 m), weight 77.1 kg, SpO2 100 %. Physical Exam Vitals reviewed.  Constitutional:      General: He is in acute distress.  Cardiovascular:     Rate and Rhythm: Tachycardia present.  Abdominal:     Palpations: Abdomen is soft.   Sacrum with fairly large wound with no tunneling, wet eschar and cellulitis    Assessment/Plan: Sacral decubitus ulcer -ulcer is open , there is necrotic tissue present but dont think this needs any urgent surgery for it.  -agree with abx, certainly may be role for bedside debridement if inr normal and off eliquis 48 hours -likely would not recommend operative therapy in the OR  I reviewed last 24 h vitals and pain scores, last 24 h labs and trends, and last 24 h imaging results.I discussed with ER provider as well  This care required moderate level of medical decision making.    Rolm Bookbinder 05/01/2022, 4:51 PM

## 2022-05-01 NOTE — Progress Notes (Signed)
Phone call to nurse about getting repeat lactic acid and that she has a levophed order. Pt is hypotensive.

## 2022-05-01 NOTE — Progress Notes (Signed)
Changed aztreonam to ceftriaxone per pharmacy consult. Patient has tolerated ceftriaxone 02/2022 per chart review.

## 2022-05-01 NOTE — Progress Notes (Signed)
Elink following code sepsis °

## 2022-05-01 NOTE — Progress Notes (Signed)
Peripherally Inserted Central Catheter Placement  The IV Nurse has discussed with the patient and/or persons authorized to consent for the patient, the purpose of this procedure and the potential benefits and risks involved with this procedure.  The benefits include less needle sticks, lab draws from the catheter, and the patient may be discharged home with the catheter. Risks include, but not limited to, infection, bleeding, blood clot (thrombus formation), and puncture of an artery; nerve damage and irregular heartbeat and possibility to perform a PICC exchange if needed/ordered by physician.  Alternatives to this procedure were also discussed.  Bard Power PICC patient education guide, fact sheet on infection prevention and patient information card has been provided to patient /or left at bedside.  Consent signed by son at patient request.  PICC Placement Documentation  PICC Triple Lumen 32/00/37 Right Basilic 37 cm 0 cm (Active)  Indication for Insertion or Continuance of Line Vasoactive infusions;Limited venous access - need for IV therapy >5 days (PICC only) 05/01/22 2053  Exposed Catheter (cm) 0 cm 05/01/22 2053  Site Assessment Clean;Dry;Intact 05/01/22 2053  Lumen #1 Status Flushed;Saline locked;Blood return noted 05/01/22 2053  Lumen #2 Status Flushed;Saline locked;Blood return noted 05/01/22 2053  Lumen #3 Status Flushed;Saline locked;Blood return noted 05/01/22 2053  Dressing Type Transparent;Securing device 05/01/22 2053  Dressing Status Antimicrobial disc in place 05/01/22 2053  Safety Lock Not Applicable 94/44/61 9012  Line Care Connections checked and tightened 05/01/22 2053  Line Adjustment (NICU/IV Team Only) No 05/01/22 2053  Dressing Intervention New dressing 05/01/22 2053  Dressing Change Due 05/08/22 05/01/22 2053       Yashar Inclan, Nicolette Bang 05/01/2022, 8:53 PM

## 2022-05-01 NOTE — Progress Notes (Signed)
Notified provider of need to administer vasopressors.

## 2022-05-01 NOTE — H&P (Addendum)
NAME:  Steven Ward, MRN:  [id number], DOB:  05-19-1925, LOS: 0 ADMISSION DATE:  05/01/2022, CONSULTATION DATE:  05/01/22 REFERRING MD:  EDP, CHIEF COMPLAINT:   sacral wound  History of Present Illness:  Steven Ward is a 88 y.o. M with PMH significant for R frontal lobe and cerebellar CVA in 02/2021, DM , parkinson's, colon Ca, Afib on xarelto who has been at rehab and then SNF since his stroke who presents today after staff noticed a large sacral decubitus ulcer and foul smelling drainage.  His son was not aware that this had progressed rapidly.  States that pt has been largely bed-bound secondary to weakness and is not successfully working with physical therapy.   Work-up in the ED revealed sacral subcutaneous air and fluid collection and lactic acid 3.4, Creatinine 2.1, Hgb 7.7.  His blood pressure was low despite IVF and antibiotics and PCCM was consulted for admission.   Pt denies pain or fever  Pertinent  Medical History   has a past medical history of ABNORMAL THYROID FUNCTION TESTS (01/17/2010), ALLERGIC RHINITIS (12/01/2008), Colon cancer ([place]), COLONIC POLYPS, HX OF (12/01/2008), DIABETES MELLITUS, TYPE II (12/01/2008), EDEMA LEG (05/04/2009), ESSENTIAL HYPERTENSION (12/01/2008), Exocrine pancreatic insufficiency, GALLSTONES (04/07/2009), GERD (12/01/2008), Hay fever, and SPONDYLOSIS, LUMBAR (12/01/2008).   Significant Hospital Events: Including procedures, antibiotic start and stop dates in addition to other pertinent events   8/14 admit to PCCM with septic shock from sacral decubitus ulcer, started on Levophed, vanc and cefepime  Interim History / Subjective:  Pt remains alert and interactive, denies pain  Objective   Blood pressure (!) 109/53, pulse 88, temperature 97.8 F (36.6 C), temperature source Oral, resp. rate 14, height $RemoveBe'5\' 8"'kyjIxiwIS$  (1.727 m), weight 77.1 kg, SpO2 100 %.        Intake/Output Summary (Last 24 hours) at 05/01/2022 1656 Last data filed at 05/01/2022 1653 Gross per 24  hour  Intake 502.87 ml  Output 450 ml  Net 52.87 ml   Filed Weights   05/01/22 1151  Weight: 77.1 kg    General:  thin, elderly, chronically ill-appearing M in no distress HEENT: MM pink/moist, sclera anicteric Neuro: awake, able to wiggle toes, awake, answering questions appropriately, oriented CV: s1s2 tachycardic, irregular, no m/r/g PULM:  lungs clear bilaterally on RA without distress GI: soft, mildly distended  Extremities: warm/dry, 3+ pitting edema  Skin: no rashes or lesions, unstageable sacral decubitus ulcer with foul smelling drainage    Resolved Hospital Problem list     Assessment & Plan:   Septic Shock secondary to Sacral Decubitus ulcer  With underlying gas and 7x3x2 fluid collection on CT -admit to ICU for pressors and broad spectrum antibiotics, Vancomycin and Cefepime -follow blood cultures -continue Levophed to maintain MAP >65, will need CVC -trend lactic -seen by surgery, no urgent surgery indicated, may need bedside debridement once off Eliquis for 48 hrs -wound care consult  Acute Renal Failure superimposed on CKD 3b GRF 28 and creatinine 2.10 -has received 30cc/kg IVF -monitor renal indices and UOP and avoid nephrotoxins   Type 2 DM -hold home Glipizide -SSI   Atrial Fibrillation -hold metoprolol in the setting of hypotension -hold DOAC as may need debridement -heparin gtt -rate currently in 70's, if more tachycardic may need amiodarone   Hypothyroidism -continue synthroid     Best Practice (right click and "Reselect all SmartList Selections" daily)   Diet/type: dysphagia diet (see orders) SLP eval DVT prophylaxis: systemic heparin GI prophylaxis: N/A Lines: N/A Foley:  N/A  Code Status:  DNR Last date of multidisciplinary goals of care discussion [Discussed with son at the bedside, confirms DNR, pressors and ICU are ok]  Labs   CBC: Recent Labs  Lab 05/01/22 1244  WBC 7.1  NEUTROABS 5.3  HGB 7.7*  HCT 24.1*  MCV  101.3*  PLT 017    Basic Metabolic Panel: Recent Labs  Lab 05/01/22 1244  NA 142  K 3.5  CL 115*  CO2 21*  GLUCOSE 195*  BUN 71*  CREATININE 2.10*  CALCIUM 7.7*   GFR: Estimated Creatinine Clearance: 20.4 mL/min (A) (by C-G formula based on SCr of 2.1 mg/dL (H)). Recent Labs  Lab 05/01/22 1244  WBC 7.1  LATICACIDVEN 3.4*    Liver Function Tests: Recent Labs  Lab 05/01/22 1244  AST 45*  ALT 10  ALKPHOS 69  BILITOT 0.5  PROT 5.0*  ALBUMIN <1.5*   No results for input(s): "LIPASE", "AMYLASE" in the last 168 hours. No results for input(s): "AMMONIA" in the last 168 hours.  ABG No results found for: "PHART", "PCO2ART", "PO2ART", "HCO3", "TCO2", "ACIDBASEDEF", "O2SAT"   Coagulation Profile: Recent Labs  Lab 05/01/22 1300  INR 1.8*    Cardiac Enzymes: No results for input(s): "CKTOTAL", "CKMB", "CKMBINDEX", "TROPONINI" in the last 168 hours.  HbA1C: Hemoglobin A1C  Date/Time Value Ref Range Status  02/08/2022 10:17 AM 7.7 (A) 4.0 - 5.6 % Final  11/09/2021 10:40 AM 6.0 (A) 4.0 - 5.6 % Final   Hgb A1c MFr Bld  Date/Time Value Ref Range Status  02/24/2022 03:37 AM 7.9 (H) 4.8 - 5.6 % Final    Comment:    (NOTE) Pre diabetes:          5.7%-6.4%  Diabetes:              >6.4%  Glycemic control for   <7.0% adults with diabetes   04/19/2016 10:20 AM 6.8 (H) 4.6 - 6.5 % Final    Comment:    Glycemic Control Guidelines for People with Diabetes:Non Diabetic:  <6%Goal of Therapy: <7%Additional Action Suggested:  >8%     CBG: No results for input(s): "GLUCAP" in the last 168 hours.  Review of Systems:   Please see the history of present illness. All other systems reviewed and are negative    Past Medical History:  He,  has a past medical history of ABNORMAL THYROID FUNCTION TESTS (01/17/2010), ALLERGIC RHINITIS (12/01/2008), Colon cancer ([place]), COLONIC POLYPS, HX OF (12/01/2008), DIABETES MELLITUS, TYPE II (12/01/2008), EDEMA LEG (05/04/2009), ESSENTIAL  HYPERTENSION (12/01/2008), Exocrine pancreatic insufficiency, GALLSTONES (04/07/2009), GERD (12/01/2008), Hay fever, and SPONDYLOSIS, LUMBAR (12/01/2008).   Surgical History:   Past Surgical History:  Procedure Laterality Date   ANKLE SURGERY Right    CATARACT EXTRACTION     CHOLECYSTECTOMY     COLON SURGERY     resection 1990 for cancer   LEG SURGERY Left      Social History:   reports that he quit smoking about 53 years ago. His smoking use included cigarettes. He has a 20.00 pack-year smoking history. He has never used smokeless tobacco. He reports that he does not drink alcohol and does not use drugs.   Family History:  His family history includes Cancer in his mother and another family member; Diabetes in an other family member; Stroke in an other family member.   Allergies Allergies  Allergen Reactions   Amoxicillin     REACTION: rash, dizziness     Home Medications  Prior to Admission medications  Medication Sig Start Date End Date Taking? Authorizing Provider  apixaban (ELIQUIS) 2.5 MG TABS tablet Take 1 tablet (2.5 mg total) by mouth 2 (two) times daily. 03/22/22   Setzer, Edman Circle, PA-C  blood glucose meter kit and supplies KIT Dispense based on patient and insurance preference. Use up to four times daily as directed. (FOR ICD-9 250.00, 250.01). 10/03/17   Burchette, Alinda Sierras, MD  Blood Glucose Monitoring Suppl (ONE TOUCH ULTRA 2) w/Device KIT Use as directed. 05/10/21   Burchette, Alinda Sierras, MD  Carbidopa-Levodopa ER (SINEMET CR) 25-100 MG tablet controlled release Take 1 tablet by mouth 2 (two) times daily with breakfast and lunch. 03/22/22   Setzer, Edman Circle, PA-C  Carbidopa-Levodopa ER (SINEMET CR) 25-100 MG tablet controlled release Take 1 tablet by mouth every evening. 03/22/22   Setzer, Edman Circle, PA-C  collagenase (SANTYL) 250 UNIT/GM ointment Apply topically daily. 03/22/22   Setzer, Edman Circle, PA-C  furosemide (LASIX) 20 MG tablet Take 1 tablet (20 mg total) by mouth daily.  03/23/22   Setzer, Edman Circle, PA-C  glipiZIDE (GLUCOTROL) 5 MG tablet Take one half tablet by mouth once daily. Patient taking differently: Take 5 mg by mouth daily before breakfast. 02/08/22   Burchette, Alinda Sierras, MD  glucose blood (ONETOUCH ULTRA) test strip USE TO TEST BLOOD SUGAR 3 TIMES A DAY 01/25/22   Burchette, Alinda Sierras, MD  Lancet Device MISC Use 1-4 times daily as needed or directed.  DX E11.9 11/01/20   Burchette, Alinda Sierras, MD  Lancets [facility] ULTRASOFT) lancets Check 3 times daily. E11.9 11/30/20   Burchette, Alinda Sierras, MD  levothyroxine (SYNTHROID) 25 MCG tablet Take 1 tablet (25 mcg total) by mouth daily. 03/22/22   Setzer, Edman Circle, PA-C  loratadine (CLARITIN) 10 MG tablet Take 1 tablet (10 mg total) by mouth daily as needed for allergies or rhinitis. 03/22/22   Setzer, Edman Circle, PA-C  magnesium oxide (MAG-OX) 400 (240 Mg) MG tablet Take 1 tablet (400 mg total) by mouth 2 (two) times daily. 03/22/22   Setzer, Edman Circle, PA-C  metoprolol tartrate (LOPRESSOR) 25 MG tablet Take 0.5 tablets (12.5 mg total) by mouth 2 (two) times daily. 03/22/22   Setzer, Edman Circle, PA-C  Multiple Vitamin (MULTIVITAMIN WITH MINERALS) TABS tablet Take 1 tablet by mouth daily. 03/23/22   Setzer, Edman Circle, PA-C  ondansetron (ZOFRAN-ODT) 4 MG disintegrating tablet Take 1 tablet (4 mg total) by mouth every 8 (eight) hours as needed for nausea or vomiting. 02/21/22   Burchette, Alinda Sierras, MD  polyethylene glycol (MIRALAX / Floria Raveling) packet Take 17 g by mouth daily as needed for moderate constipation.    [provider]  polyvinyl alcohol (LIQUIFILM TEARS) 1.4 % ophthalmic solution Place 2 drops into both eyes as needed for dry eyes (pleas eput at bedside for dry eyes). 03/22/22   Setzer, Edman Circle, PA-C  tamsulosin (FLOMAX) 0.4 MG CAPS capsule Take 2 capsules (0.8 mg total) by mouth daily after breakfast. 03/23/22   Setzer, Edman Circle, PA-C  triamcinolone cream (KENALOG) 0.1 % APPLY  CREAM EXTERNALLY TO AFFECTED AREA TWICE DAILY AS  NEEDED Patient taking differently: 1 application. daily as needed (itching). 11/28/19   [name], MD     Critical care time: 42 minutes     CRITICAL CARE Performed by: Otilio Carpen Pairlee Sawtell   Total critical care time: 42 minutes  Critical care time was exclusive of separately billable procedures and treating other patients.  Critical care was necessary to treat  or prevent imminent or life-threatening deterioration.  Critical care was time spent personally by me on the following activities: development of treatment plan with patient and/or surrogate as well as nursing, discussions with consultants, evaluation of patient's response to treatment, examination of patient, obtaining history from patient or surrogate, ordering and performing treatments and interventions, ordering and review of laboratory studies, ordering and review of radiographic studies, pulse oximetry and re-evaluation of patient's condition.   Otilio Carpen Jerrye Seebeck, PA-C [facility] See Amion for pager If no response to pager , please call [phone] until 7pm After 7:00 pm call Elink  [phone]?[phone]

## 2022-05-01 NOTE — Progress Notes (Signed)
Passed bedside swallow evaluation. No immediate needs for surgery. Advanced diet to soft diet.

## 2022-05-01 NOTE — Progress Notes (Signed)
Pharmacy Antibiotic Note  Steven Ward is a 88 y.o. male admitted on 05/01/2022 with cellulitis.  Pharmacy has been consulted for vancomycin dosing.  Plan: Patient to receive 1x load of IV Vancomycin '1250mg'$  ('20mg'$ /kg).  Then start 1250 Q48H for a predicted AUC of 480.  Patient has CKD at baseline with Scr at baseline ~1.90. Monitor for worsening renal function that would require adjustments.   Height: '5\' 8"'$  (172.7 cm) Weight: 77.1 kg (169 lb 15.6 oz) IBW/kg (Calculated) : 68.4  Temp (24hrs), Avg:97.8 F (36.6 C), Min:97.8 F (36.6 C), Max:97.8 F (36.6 C)  Recent Labs  Lab 05/01/22 1244  WBC 7.1  CREATININE 2.10*    Estimated Creatinine Clearance: 20.4 mL/min (A) (by C-G formula based on SCr of 2.1 mg/dL (H)).    Allergies  Allergen Reactions   Amoxicillin     REACTION: rash, dizziness    Thank you for allowing pharmacy to be a part of this patient's care.  [name] 05/01/2022 1:44 PM

## 2022-05-01 NOTE — ED Triage Notes (Signed)
Pt BIB [facility] from [place] for a wound check on his sacrum. According to staff and [facility] the dressing was changed today, but staff noticed a smell coming from the wound and wanted to rule out infection. Staff also noted that pt's Creatinine was high and that his hemoglobin was 7.3. Pt does have a DNR and has a past med hx of failure to thrive.  Pt is alert and oriented x3.   VSS w/ [facility]

## 2022-05-01 NOTE — Progress Notes (Signed)
Notified provider and bedside nurse of need to order and administer fluid bolus pt needs 2313 cc.

## 2022-05-01 NOTE — Progress Notes (Signed)
[place] Progress Note Patient Name: Steven Ward DOB: 07-13-1925 MRN: [id number]   Date of Service  05/01/2022  HPI/Events of Note  Patient with significant bleeding from sacral wound margins. Hemoglobin 7.7 gm / dl.  eICU Interventions  Hold Heparin gtt, Transfuse 1 unit PRBC, H & H Q 4 hours x 3, Thrombin pad dressing to bleeding wound margins.        Kerry Kass Gearldene Fiorenza 05/01/2022, 11:45 PM

## 2022-05-01 NOTE — Progress Notes (Signed)
ANTICOAGULATION CONSULT NOTE - Initial Consult  Pharmacy Consult for heparin Indication: atrial fibrillation  Allergies  Allergen Reactions   Amoxicillin     REACTION: rash, dizziness    Patient Measurements: Height: '5\' 8"'$  (172.7 cm) Weight: 77.1 kg (169 lb 15.6 oz) IBW/kg (Calculated) : 68.4 Heparin Dosing Weight: TBW  Vital Signs: Temp: 98.1 F (36.7 C) (08/14 1722) Temp Source: Oral (08/14 1722) BP: 109/53 (08/14 1648) Pulse Rate: 88 (08/14 1648)  Labs: Recent Labs    05/01/22 1244 05/01/22 1300  HGB 7.7*  --   HCT 24.1*  --   PLT 240  --   APTT  --  39*  LABPROT  --  20.9*  INR  --  1.8*  CREATININE 2.10*  --     Estimated Creatinine Clearance: 20.4 mL/min (A) (by C-G formula based on SCr of 2.1 mg/dL (H)).   Medical History: Past Medical History:  Diagnosis Date   ABNORMAL THYROID FUNCTION TESTS 01/17/2010   ALLERGIC RHINITIS 12/01/2008   Colon cancer ([place])    COLONIC POLYPS, HX OF 12/01/2008   DIABETES MELLITUS, TYPE II 12/01/2008   EDEMA LEG 05/04/2009   ESSENTIAL HYPERTENSION 12/01/2008   Exocrine pancreatic insufficiency    GALLSTONES 04/07/2009   GERD 12/01/2008   Hay fever    SPONDYLOSIS, LUMBAR 12/01/2008   Assessment: 89 YOM presenting with AMS, sacral decubitus ulcer, hx afib on Eliquis PTA on hold for possible debridement.    Goal of Therapy:  Heparin level 0.3-0.7 units/ml aPTT 66-102 seconds Monitor platelets by anticoagulation protocol: Yes   Plan:  Heparin gtt at 1100 units/hr, no bolus F/u 8 hour aPTT/HL F/u intervention plans and ability to transition back to [place], PharmD Clinical Pharmacist ED Pharmacist Phone # [phone] 05/01/2022 5:41 PM

## 2022-05-02 DIAGNOSIS — A419 Sepsis, unspecified organism: Secondary | ICD-10-CM | POA: Diagnosis not present

## 2022-05-02 DIAGNOSIS — R6521 Severe sepsis with septic shock: Secondary | ICD-10-CM | POA: Diagnosis not present

## 2022-05-02 LAB — PREPARE RBC (CROSSMATCH)

## 2022-05-02 LAB — CBC
HCT: 22.3 % — ABNORMAL LOW (ref 39.0–52.0)
HCT: 22.6 % — ABNORMAL LOW (ref 39.0–52.0)
Hemoglobin: 7.3 g/dL — ABNORMAL LOW (ref 13.0–17.0)
Hemoglobin: 7.1 g/dL — ABNORMAL LOW (ref 13.0–17.0)
MCH: 32.7 pg (ref 26.0–34.0)
MCH: 33 pg (ref 26.0–34.0)
MCHC: 32.7 g/dL (ref 30.0–36.0)
MCHC: 31.4 g/dL (ref 30.0–36.0)
MCV: 100 fL (ref 80.0–100.0)
MCV: 105.1 fL — ABNORMAL HIGH (ref 80.0–100.0)
Platelets: 231 10*3/uL (ref 150–400)
Platelets: 236 10*3/uL (ref 150–400)
RBC: 2.23 MIL/uL — ABNORMAL LOW (ref 4.22–5.81)
RBC: 2.15 MIL/uL — ABNORMAL LOW (ref 4.22–5.81)
RDW: 15.7 % — ABNORMAL HIGH (ref 11.5–15.5)
RDW: 15.8 % — ABNORMAL HIGH (ref 11.5–15.5)
WBC: 6.4 10*3/uL (ref 4.0–10.5)
WBC: 6.9 10*3/uL (ref 4.0–10.5)
nRBC: 0 % (ref 0.0–0.2)
nRBC: 0 % (ref 0.0–0.2)

## 2022-05-02 LAB — BASIC METABOLIC PANEL
Anion gap: 7 (ref 5–15)
BUN: 61 mg/dL — ABNORMAL HIGH (ref 8–23)
CO2: 20 mmol/L — ABNORMAL LOW (ref 22–32)
Calcium: 7.3 mg/dL — ABNORMAL LOW (ref 8.9–10.3)
Chloride: 104 mmol/L (ref 98–111)
Creatinine, Ser: 1.87 mg/dL — ABNORMAL HIGH (ref 0.61–1.24)
GFR, Estimated: 33 mL/min — ABNORMAL LOW (ref >60)
Glucose, Bld: 466 mg/dL — ABNORMAL HIGH (ref 70–99)
Potassium: 3.3 mmol/L — ABNORMAL LOW (ref 3.5–5.1)
Sodium: 131 mmol/L — ABNORMAL LOW (ref 135–145)

## 2022-05-02 LAB — HEMOGLOBIN AND HEMATOCRIT, BLOOD
HCT: 23.6 % — ABNORMAL LOW (ref 39.0–52.0)
HCT: 29.1 % — ABNORMAL LOW (ref 39.0–52.0)
Hemoglobin: 7.8 g/dL — ABNORMAL LOW (ref 13.0–17.0)
Hemoglobin: 9.7 g/dL — ABNORMAL LOW (ref 13.0–17.0)

## 2022-05-02 LAB — HEPARIN LEVEL (UNFRACTIONATED): Heparin Unfractionated: >1.1 IU/mL — ABNORMAL HIGH (ref 0.30–0.70)

## 2022-05-02 LAB — GLUCOSE, CAPILLARY
Glucose-Capillary: 170 mg/dL — ABNORMAL HIGH (ref 70–99)
Glucose-Capillary: 588 mg/dL (ref 70–99)
Glucose-Capillary: 161 mg/dL — ABNORMAL HIGH (ref 70–99)
Glucose-Capillary: 195 mg/dL — ABNORMAL HIGH (ref 70–99)
Glucose-Capillary: 83 mg/dL (ref 70–99)
Glucose-Capillary: 98 mg/dL (ref 70–99)
Glucose-Capillary: 155 mg/dL — ABNORMAL HIGH (ref 70–99)

## 2022-05-02 LAB — MAGNESIUM: Magnesium: 1.8 mg/dL (ref 1.7–2.4)

## 2022-05-02 LAB — PHOSPHORUS: Phosphorus: 2.5 mg/dL (ref 2.5–4.6)

## 2022-05-02 LAB — APTT: aPTT: 37 seconds — ABNORMAL HIGH (ref 24–36)

## 2022-05-02 LAB — ABO/RH: ABO/RH(D): A NEG

## 2022-05-02 MED ORDER — NOREPINEPHRINE 16 MG/250ML-% IV SOLN
0.0000 ug/min | INTRAVENOUS | Status: DC
  Administered 2022-05-02: 16 ug/min via INTRAVENOUS
  Administered 2022-05-03: 12 ug/min via INTRAVENOUS
  Administered 2022-05-04: 8 ug/min via INTRAVENOUS
  Filled 2022-05-02 (×3): qty 250

## 2022-05-02 MED ORDER — POTASSIUM CHLORIDE CRYS ER 20 MEQ PO TBCR: 40.0000 meq | EXTENDED_RELEASE_TABLET | Freq: Once | ORAL | Status: DC

## 2022-05-02 MED ORDER — SODIUM CHLORIDE 0.9% IV SOLUTION: Freq: Once | INTRAVENOUS | Status: DC

## 2022-05-02 MED ORDER — POTASSIUM CHLORIDE 10 MEQ/50ML IV SOLN
10.0000 meq | INTRAVENOUS | Status: AC
  Administered 2022-05-02 (×2): 10 meq via INTRAVENOUS
  Filled 2022-05-02 (×2): qty 50

## 2022-05-02 MED ORDER — INSULIN ASPART 100 UNIT/ML IJ SOLN
0.0000 [IU] | INTRAMUSCULAR | Status: DC
  Administered 2022-05-03: 3 [IU] via SUBCUTANEOUS

## 2022-05-02 MED ORDER — DAKINS (1/4 STRENGTH) 0.125 % EX SOLN
Freq: Every day | CUTANEOUS | Status: DC
Start: 2022-05-02 — End: 2022-05-10
  Administered 2022-05-02 – 2022-05-06 (×3): 1
  Filled 2022-05-02 (×3): qty 473

## 2022-05-02 MED ORDER — CALCIUM GLUCONATE-NACL 1-0.675 GM/50ML-% IV SOLN
1.0000 g | Freq: Once | INTRAVENOUS | Status: AC
  Administered 2022-05-02: 1000 mg via INTRAVENOUS
  Filled 2022-05-02: qty 50

## 2022-05-02 MED ORDER — MAGNESIUM SULFATE 2 GM/50ML IV SOLN
2.0000 g | Freq: Once | INTRAVENOUS | Status: AC
  Administered 2022-05-02: 2 g via INTRAVENOUS
  Filled 2022-05-02: qty 50

## 2022-05-02 MED ORDER — INSULIN GLARGINE-YFGN 100 UNIT/ML ~~LOC~~ SOLN
10.0000 [IU] | Freq: Every day | SUBCUTANEOUS | Status: DC
  Administered 2022-05-02 – 2022-05-04 (×3): 10 [IU] via SUBCUTANEOUS
  Filled 2022-05-02 (×3): qty 0.1

## 2022-05-02 NOTE — Progress Notes (Signed)
[facility] Progress Note Patient Name: Steven Ward DOB: 14-Feb-1925 MRN: [id number]   Date of Service  05/02/2022  HPI/Events of Note  Bleeding has dramatically  improved with Thrombin pad dressing + Heparin hold, Hemoglobin 7.8 gm / dl.  eICU Interventions  PRBC transfusion order discontinued.        Kerry Kass [place] 05/02/2022, 1:35 AM

## 2022-05-02 NOTE — TOC Initial Note (Signed)
Transition of Care [facility]) - Initial/Assessment Note    Patient Details  Name: Steven Ward MRN: [id number] Date of Birth: 12/29/1924  [facility]) CM/SW Contact:    Milas Gain, [place] Phone Number: 05/02/2022, 4:21 PM  Clinical Narrative:                  CSW spoke with patient at bedside. Patient reports he comes from [facility]. Patient confirms his plan is to return to [facility] when medically ready for dc. CSW will continue to follow and assist with patients dc planning needs.  Expected Discharge Plan: Skilled Nursing Facility Barriers to Discharge: Continued Medical Work up   Patient Goals and CMS Choice Patient states their goals for this hospitalization and ongoing recovery are:: SNF CMS Medicare.gov Compare Post Acute Care list provided to:: Patient Choice offered to / list presented to : Patient  Expected Discharge Plan and Services Expected Discharge Plan: [place] In-house Referral: Clinical Social Work     Living arrangements for the past 2 months: [place]                                      Prior Living Arrangements/Services Living arrangements for the past 2 months: [place] Lives with:: Facility Resident (From [employer]) Patient language and need for interpreter reviewed:: Yes Do you feel safe going back to the place where you live?: Yes      Need for Family Participation in Patient Care: Yes (Comment) Care giver support system in place?: Yes (comment)   Criminal Activity/Legal Involvement Pertinent to Current Situation/Hospitalization: No - Comment as needed  Activities of Daily Living      Permission Sought/Granted Permission sought to share information with : Case Manager, Family Supports, Customer service manager Permission granted to share information with : Yes, Verbal Permission Granted  Share Information with NAME: [name]  Permission granted to share info w AGENCY:  SNF  Permission granted to share info w Relationship: son  Permission granted to share info w Contact Information: [name] [phone]  Emotional Assessment Appearance:: Appears stated age Attitude/Demeanor/Rapport: Gracious Affect (typically observed): Calm Orientation: : Oriented to Self, Oriented to Place, Oriented to  Time, Oriented to Situation Alcohol / Substance Use: Not Applicable Psych Involvement: No (comment)  Admission diagnosis:  Wound infection [T14.8XXA, L08.9] AKI (acute kidney injury) ([facility]) [N17.9] Septic shock ([place]) [A41.9, R65.21] Anemia, unspecified type [D64.9] Sepsis, due to unspecified organism, unspecified whether acute organ dysfunction present [facility]) [A41.9] Patient Active Problem List   Diagnosis Date Noted   Septic shock ([place]) 05/01/2022   Protein-calorie malnutrition, severe 03/11/2022   CVA (cerebral vascular accident) ([place]) 03/10/2022   Cerebrovascular accident (CVA) due to embolism of right cerebellar artery ([place])    Constipation 03/07/2022   Hypophosphatemia 03/02/2022   Small bowel obstruction ([place]) 03/01/2022   Hypokalemia 03/01/2022   Hypomagnesemia 03/01/2022   Stage 3b chronic kidney disease ([place]) 71/69/6789   Metabolic acidosis 38/06/1750   Acute CVA (cerebrovascular accident) ([place]) 03/01/2022   Pneumonia 03/01/2022   Hypothyroidism 03/01/2022   Compression fracture of body of thoracic vertebra ([place]) 03/01/2022   Pleural plaque 03/01/2022   Pulmonary fibrosis ([place]) 03/01/2022   Right lower lobe lung mass 03/01/2022   Decubitus ulcer 02/26/2022   Paroxysmal atrial fibrillation ([place]) 02/22/2022   Generalized weakness 02/22/2022   Nausea & vomiting 02/22/2022   Acute kidney injury superimposed on  chronic kidney disease ([place]) 02/22/2022   Anemia 07/06/2021   Atrial flutter ([place]) 07/06/2021   Disease of pancreas 07/06/2021   Encounter for immunization 07/06/2021   Enlarged prostate 07/06/2021   Hyperlipidemia 07/06/2021   Malignant  neoplasm of colon ([place]) 07/06/2021   Other B-complex deficiencies 07/06/2021   Peripheral vascular disease ([place]) 07/06/2021   Pure hypercholesterolemia 07/06/2021   At moderate risk for fall 08/05/2019   Parkinson's disease ([place]) 01/15/2018   Dyslipidemia 04/08/2017   Major depressive episode 04/19/2016   CKD stage 3 due to type 2 diabetes mellitus ([place]) 08/25/2015   Spinal stenosis of lumbar region 05/25/2015   BPH (benign prostatic hyperplasia) 11/10/2013   Bilateral lumbar radiculopathy 11/10/2013   Obesity (BMI 30-39.9) 04/21/2013   Dyspnea 06/23/2011   PAF (paroxysmal atrial fibrillation) ([place]) 06/23/2011   SKIN RASH 01/17/2010   ABNORMAL THYROID FUNCTION TESTS 01/17/2010   EDEMA LEG 05/04/2009   GALLSTONES 04/07/2009   ABDOMINAL PAIN RIGHT UPPER QUADRANT 04/07/2009   DIARRHEA 02/26/2009   T2DM (type 2 diabetes mellitus) ([place]) 12/01/2008   Primary hypertension 12/01/2008   ALLERGIC RHINITIS 12/01/2008   GERD 12/01/2008   SPONDYLOSIS, LUMBAR 12/01/2008   COLONIC POLYPS, HX OF 12/01/2008   PCP:  [name], MD Pharmacy:   [place], [place] - [address] [place] [address] Phone: [phone] Fax: [phone]     Social Determinants of Health (SDOH) Interventions    Readmission Risk Interventions    03/01/2022    2:59 PM  Readmission Risk Prevention Plan  Transportation Screening Complete  PCP or Specialist Appt within 3-5 Days Complete  HRI or [place] Complete  Social Work Consult for [place] Planning/Counseling Complete  Palliative Care Screening Not Applicable  Medication Review [employer]) Complete

## 2022-05-02 NOTE — Evaluation (Signed)
Clinical/Bedside Swallow Evaluation Patient Details  Name: Steven Ward MRN: [id number] Date of Birth: May 22, 1925  Today's Date: 05/02/2022 Time: SLP Start Time (ACUTE ONLY): 0925 SLP Stop Time (ACUTE ONLY): 0945 SLP Time Calculation (min) (ACUTE ONLY): 20 min  Past Medical History:  Past Medical History:  Diagnosis Date   ABNORMAL THYROID FUNCTION TESTS 01/17/2010   ALLERGIC RHINITIS 12/01/2008   Colon cancer ([place])    COLONIC POLYPS, HX OF 12/01/2008   DIABETES MELLITUS, TYPE II 12/01/2008   EDEMA LEG 05/04/2009   ESSENTIAL HYPERTENSION 12/01/2008   Exocrine pancreatic insufficiency    GALLSTONES 04/07/2009   GERD 12/01/2008   Hay fever    SPONDYLOSIS, LUMBAR 12/01/2008   Past Surgical History:  Past Surgical History:  Procedure Laterality Date   ANKLE SURGERY Right    CATARACT EXTRACTION     CHOLECYSTECTOMY     COLON SURGERY     resection 1990 for cancer   LEG SURGERY Left    HPI:  Patient is a 88 y.o. male with PMH: CVA, Parkinson's disorder, colon cancer and atrial fibrilation who presented to the hospital from SNF with infected sacral decubitus ulcer. In ED he was found to be hypotensive and started on Levophed. Admitted to PCCM for septic shock secondary to sacral decubitus ulcer. He was recently admitted to hospital (6/7) following fall and MRI showing right cerebellar infarct. He was evaluated acutely and then by SLP in AIR where he was evaluated for swallow and cognition but was found to be functioning [facility] for both and therapeutic intervention by SLP not warranted.    Assessment / Plan / Recommendation  Clinical Impression  Patient presents with a mild oral phase dysphagia with impact from lack of dentition (although patient reports he does not wear dentures to eat). Swallow initiation was timely with solids and liquids and no overt s/s aspiration or penetration observed. Vocal quality was unchanged during and after PO intake. SLP recommending to continue with Dys 3  (mechanical soft) solids and thin liquids and will follow patient briefly for diet toleration and ability to upgrade to regular solids. SLP Visit Diagnosis: Dysphagia, unspecified (R13.10)    Aspiration Risk  No limitations    Diet Recommendation Dysphagia 3 (Mech soft);Thin liquid   Liquid Administration via: Cup;Straw Medication Administration: Whole meds with liquid Supervision: Patient able to self feed;Intermittent supervision to cue for compensatory strategies Compensations: Slow rate;Small sips/bites Postural Changes: Other (Comment) (seated as upright as patient tolerates)    Other  Recommendations Oral Care Recommendations: Oral care BID;Staff/trained caregiver to provide oral care    Recommendations for follow up therapy are one component of a multi-disciplinary discharge planning process, led by the attending physician.  Recommendations may be updated based on patient status, additional functional criteria and insurance authorization.  Follow up Recommendations No SLP follow up      Assistance Recommended at Discharge None  Functional Status Assessment Patient has had a recent decline in their functional status and demonstrates the ability to make significant improvements in function in a reasonable and predictable amount of time.  Frequency and Duration min 1 x/week  1 week       Prognosis Prognosis for Safe Diet Advancement: Good      Swallow Study   General Date of Onset: 05/01/22 HPI: Patient is a 88 y.o. male with PMH: CVA, Parkinson's disorder, colon cancer and atrial fibrilation who presented to the hospital from SNF with infected sacral decubitus ulcer. In ED he was found to be hypotensive  and started on Levophed. Admitted to PCCM for septic shock secondary to sacral decubitus ulcer. He was recently admitted to hospital (6/7) following fall and MRI showing right cerebellar infarct. He was evaluated acutely and then by SLP in AIR where he was evaluated for swallow  and cognition but was found to be functioning [facility] for both and therapeutic intervention by SLP not warranted. Type of Study: Bedside Swallow Evaluation Previous Swallow Assessment: during previous admission last month Diet Prior to this Study: Dysphagia 3 (soft);Thin liquids Temperature Spikes Noted: No Respiratory Status: Room air History of Recent Intubation: No Behavior/Cognition: Alert;Cooperative;Pleasant mood Oral Cavity Assessment: Within Functional Limits Oral Care Completed by SLP: Recent completion by staff Oral Cavity - Dentition: Edentulous Vision: Functional for self-feeding Self-Feeding Abilities: Able to feed self;Needs set up Patient Positioning: Partially reclined Baseline Vocal Quality: Low vocal intensity;Normal Volitional Cough: Strong Volitional Swallow: Able to elicit    Oral/Motor/Sensory Function Overall Oral Motor/Sensory Function: Within functional limits   Ice Chips     Thin Liquid Thin Liquid: Within functional limits Presentation: Straw;Self Fed    Nectar Thick     Honey Thick     Puree Puree: Not tested   Solid     Solid: Impaired Presentation: Spoon Oral Phase Impairments: Impaired mastication Other Comments: mildly prolonged mastication and oral transit with cracker      Sonia Baller, MA, CCC-SLP Speech Therapy

## 2022-05-02 NOTE — Consult Note (Signed)
WOC Nurse Consult Note: Reason for Consult:sacrum and heels Wound type: Unstageable Pressure Injury: sacrum Deep Tissue Pressure Injury: right heel Deep Tissue Pressure Injury: left heel  Pressure Injury POA: Yes Measurement: see nursing flow sheets Wound bed: sacrum: 80% pale, pink, 20% yellow/grey soft black tissue with black soft eschar roof from 1-3 o'clock  Drainage (amount, consistency, odor)  Sacrum: minimal with foul odor note by surgery and bedside staff, not surprised because of necrotic tissue.  Heels; dark purple, non blanchable  Periwound: intact  Dressing procedure/placement/frequency: Add 1/4% sodium hypochlorite for odor and chemical debridement daily Continue silicone foam to the bilateral heel wounds for protection and insulation Add Prevalon boots bilaterally for offloading heels at all time Low low air loss mattress in place while in the ICU For moisture management and pressure re-distribution Sacral wound can benefit from bedside debridement however surgery team pending this because of anticoagulation Discussion with surgery; added PT evaluation for hydrotherapy, however wound has been bleeding, will hold off on PT hydrotherapy evaluation for now.  RD for nutritional support for wound healing Manage incontinence as appropriate for wound healing (bowel and bladder)  WOC Nurse team will follow with you and see patient within 10 days for wound assessments.  Please notify [facility] nurses of any acute changes in the wounds or any new areas of concern [place] MSN, [place], [place], [place]

## 2022-05-02 NOTE — Progress Notes (Signed)
[facility] Progress Note Patient Name: Steven Ward DOB: 26-Nov-1924 MRN: [id number]   Date of Service  05/02/2022  HPI/Events of Note  K+ 3.3  eICU Interventions  KCL 40 meq po x 1 ordered.        [name] 05/02/2022, 6:14 AM

## 2022-05-02 NOTE — Progress Notes (Signed)
NAME:  Steven Ward, MRN:  [id number], DOB:  24-Jul-1925, LOS: 1 ADMISSION DATE:  05/01/2022, CONSULTATION DATE:  05/02/22 REFERRING MD:  EDP, CHIEF COMPLAINT:   sacral wound  History of Present Illness:  Steven Ward is a 88 y.o. M with PMH significant for R frontal lobe and cerebellar CVA in 02/2021, DM , parkinson's, colon Ca, Afib on xarelto who has been at rehab and then SNF since his stroke who presents today after staff noticed a large sacral decubitus ulcer and foul smelling drainage.  His son was not aware that this had progressed rapidly.  States that pt has been largely bed-bound secondary to weakness and is not successfully working with physical therapy.   Work-up in the ED revealed sacral subcutaneous air and fluid collection and lactic acid 3.4, Creatinine 2.1, Hgb 7.7.  His blood pressure was low despite IVF and antibiotics and PCCM was consulted for admission.   Pt denies pain or fever  Pertinent  Medical History   has a past medical history of ABNORMAL THYROID FUNCTION TESTS (01/17/2010), ALLERGIC RHINITIS (12/01/2008), Colon cancer ([facility]), COLONIC POLYPS, HX OF (12/01/2008), DIABETES MELLITUS, TYPE II (12/01/2008), EDEMA LEG (05/04/2009), ESSENTIAL HYPERTENSION (12/01/2008), Exocrine pancreatic insufficiency, GALLSTONES (04/07/2009), GERD (12/01/2008), Hay fever, and SPONDYLOSIS, LUMBAR (12/01/2008).   Significant Hospital Events: Including procedures, antibiotic start and stop dates in addition to other pertinent events   8/14 admit to PCCM with septic shock from sacral decubitus ulcer, started on Levophed, vanc and cefepime  Interim History / Subjective:  Started on heparin gtt yesterday while holding DOAC, had some bleeding around the decubitus ulcer and hematuria, heparin held Got PICC, on Levophed , no complaints today  Objective   Blood pressure (!) 123/53, pulse 87, temperature 97.8 F (36.6 C), temperature source Axillary, resp. rate 12, height 5\' 8"  (1.727 m), weight  87.9 kg, SpO2 97 %.        Intake/Output Summary (Last 24 hours) at 05/02/2022 0724 Last data filed at 05/02/2022 0600 Gross per 24 hour  Intake 1522.82 ml  Output 1480 ml  Net 42.82 ml    Filed Weights   05/01/22 1151 05/02/22 0600  Weight: 77.1 kg 87.9 kg    General:  thin, elderly, chronically ill-appearing M in no distress, eating breakfast HEENT: MM pink/moist, sclera anicteric Neuro: awake, able to wiggle toes, awake, answering questions appropriately, oriented CV: s1s2 tachycardic, irregular, no m/r/g PULM:  lungs clear bilaterally on RA without distress GI/GU soft, mildly distended, chronic foley, hematuria without clots Extremities: warm/dry, 3+ pitting edema  Skin: no rashes or lesions, unstageable sacral decubitus ulcer with foul smelling drainage as pictured, dressed no bleeding     Resolved Hospital Problem list     Assessment & Plan:   Septic Shock secondary to Sacral Decubitus ulcer  With underlying gas and 7x3x2 fluid collection on CT -admit to ICU for pressors and broad spectrum antibiotics, Vancomycin and Cefepime -follow blood cultures -continue Levophed to maintain MAP >65, has PICC -lactic clearing from 3.4->2.6 -seen by surgery, no urgent surgery indicated, may need bedside debridement once off Eliquis for 48 hrs -wound care following   Acute Renal Failure superimposed on CKD 3b GRF 28 and creatinine 1.8 -good UOP, creatinine improving   Hypokalemia K 3.3, mag 1.8 -replete and follow   Type 2 DM Hyperglycemia Hypocalcemia -hold home Glipizide -SSI change from sensitive to moderate -resume home long acting insulin, may need to increase  Anemia Acute on chronic likely slightly worsening with bleeding from wound  overnight Hgb 7.3, with hypotension will transfuse 1 unit PRBC's    Atrial Fibrillation -hold metoprolol in the setting of hypotension -hold DOAC as may need debridement -heparin gtt held overnight 2/2 to bleeding -rate  currently in 70's, if more tachycardic may need amiodarone   Hypothyroidism -continue synthroid     Best Practice (right click and "Reselect all SmartList Selections" daily)   Diet/type: dysphagia diet (see orders) DVT prophylaxis: SCD GI prophylaxis: N/A Lines: Central line PICC  Foley:  Yes, and it is still needed Code Status:  DNR Last date of multidisciplinary goals of care discussion [Discussed with son at the bedside, confirms DNR, pressors and ICU are ok]  Labs   CBC: Recent Labs  Lab 05/01/22 1244 05/02/22 0035 05/02/22 0448  WBC 7.1  --  6.4  NEUTROABS 5.3  --   --   HGB 7.7* 7.8* 7.3*  HCT 24.1* 23.6* 22.3*  MCV 101.3*  --  100.0  PLT 240  --  231     Basic Metabolic Panel: Recent Labs  Lab 05/01/22 1244 05/02/22 0448  NA 142 131*  K 3.5 3.3*  CL 115* 104  CO2 21* 20*  GLUCOSE 195* 466*  BUN 71* 61*  CREATININE 2.10* 1.87*  CALCIUM 7.7* 7.3*  MG  --  1.8  PHOS  --  2.5    GFR: Estimated Creatinine Clearance: 25.5 mL/min (A) (by C-G formula based on SCr of 1.87 mg/dL (H)). Recent Labs  Lab 05/01/22 1244 05/01/22 1700 05/02/22 0448  WBC 7.1  --  6.4  LATICACIDVEN 3.4* 2.6*  --      Liver Function Tests: Recent Labs  Lab 05/01/22 1244  AST 45*  ALT 10  ALKPHOS 69  BILITOT 0.5  PROT 5.0*  ALBUMIN <1.5*    No results for input(s): "LIPASE", "AMYLASE" in the last 168 hours. No results for input(s): "AMMONIA" in the last 168 hours.  ABG No results found for: "PHART", "PCO2ART", "PO2ART", "HCO3", "TCO2", "ACIDBASEDEF", "O2SAT"   Coagulation Profile: Recent Labs  Lab 05/01/22 1300  INR 1.8*     Cardiac Enzymes: No results for input(s): "CKTOTAL", "CKMB", "CKMBINDEX", "TROPONINI" in the last 168 hours.  HbA1C: Hemoglobin A1C  Date/Time Value Ref Range Status  02/08/2022 10:17 AM 7.7 (A) 4.0 - 5.6 % Final  11/09/2021 10:40 AM 6.0 (A) 4.0 - 5.6 % Final   Hgb A1c MFr Bld  Date/Time Value Ref Range Status  02/24/2022  03:37 AM 7.9 (H) 4.8 - 5.6 % Final    Comment:    (NOTE) Pre diabetes:          5.7%-6.4%  Diabetes:              >6.4%  Glycemic control for   <7.0% adults with diabetes   04/19/2016 10:20 AM 6.8 (H) 4.6 - 6.5 % Final    Comment:    Glycemic Control Guidelines for People with Diabetes:Non Diabetic:  <6%Goal of Therapy: <7%Additional Action Suggested:  >8%     CBG: Recent Labs  Lab 05/01/22 1945 05/01/22 2312 05/02/22 0339 05/02/22 0442  GLUCAP 92 159* 170* 588*    Review of Systems:   Please see the history of present illness. All other systems reviewed and are negative    Past Medical History:  He,  has a past medical history of ABNORMAL THYROID FUNCTION TESTS (01/17/2010), ALLERGIC RHINITIS (12/01/2008), Colon cancer ([place]), COLONIC POLYPS, HX OF (12/01/2008), DIABETES MELLITUS, TYPE II (12/01/2008), EDEMA LEG (05/04/2009), ESSENTIAL HYPERTENSION (12/01/2008), Exocrine  pancreatic insufficiency, GALLSTONES (04/07/2009), GERD (12/01/2008), Hay fever, and SPONDYLOSIS, LUMBAR (12/01/2008).   Surgical History:   Past Surgical History:  Procedure Laterality Date   ANKLE SURGERY Right    CATARACT EXTRACTION     CHOLECYSTECTOMY     COLON SURGERY     resection 1990 for cancer   LEG SURGERY Left      Social History:   reports that he quit smoking about 53 years ago. His smoking use included cigarettes. He has a 20.00 pack-year smoking history. He has never used smokeless tobacco. He reports that he does not drink alcohol and does not use drugs.   Family History:  His family history includes Cancer in his mother and another family member; Diabetes in an other family member; Stroke in an other family member.   Allergies Allergies  Allergen Reactions   Amoxicillin     REACTION: rash, dizziness     Home Medications  Prior to Admission medications   Medication Sig Start Date End Date Taking? Authorizing Provider  apixaban (ELIQUIS) 2.5 MG TABS tablet Take 1 tablet (2.5 mg total)  by mouth 2 (two) times daily. 03/22/22   Setzer, Edman Circle, PA-C  blood glucose meter kit and supplies KIT Dispense based on patient and insurance preference. Use up to four times daily as directed. (FOR ICD-9 250.00, 250.01). 10/03/17   Burchette, Alinda Sierras, MD  Blood Glucose Monitoring Suppl (ONE TOUCH ULTRA 2) w/Device KIT Use as directed. 05/10/21   Burchette, Alinda Sierras, MD  Carbidopa-Levodopa ER (SINEMET CR) 25-100 MG tablet controlled release Take 1 tablet by mouth 2 (two) times daily with breakfast and lunch. 03/22/22   Setzer, Edman Circle, PA-C  Carbidopa-Levodopa ER (SINEMET CR) 25-100 MG tablet controlled release Take 1 tablet by mouth every evening. 03/22/22   Setzer, Edman Circle, PA-C  collagenase (SANTYL) 250 UNIT/GM ointment Apply topically daily. 03/22/22   Setzer, Edman Circle, PA-C  furosemide (LASIX) 20 MG tablet Take 1 tablet (20 mg total) by mouth daily. 03/23/22   Setzer, Edman Circle, PA-C  glipiZIDE (GLUCOTROL) 5 MG tablet Take one half tablet by mouth once daily. Patient taking differently: Take 5 mg by mouth daily before breakfast. 02/08/22   Burchette, Alinda Sierras, MD  glucose blood (ONETOUCH ULTRA) test strip USE TO TEST BLOOD SUGAR 3 TIMES A DAY 01/25/22   Burchette, Alinda Sierras, MD  Lancet Device MISC Use 1-4 times daily as needed or directed.  DX E11.9 11/01/20   Burchette, Alinda Sierras, MD  Lancets [facility] ULTRASOFT) lancets Check 3 times daily. E11.9 11/30/20   Burchette, Alinda Sierras, MD  levothyroxine (SYNTHROID) 25 MCG tablet Take 1 tablet (25 mcg total) by mouth daily. 03/22/22   Setzer, Edman Circle, PA-C  loratadine (CLARITIN) 10 MG tablet Take 1 tablet (10 mg total) by mouth daily as needed for allergies or rhinitis. 03/22/22   Setzer, Edman Circle, PA-C  magnesium oxide (MAG-OX) 400 (240 Mg) MG tablet Take 1 tablet (400 mg total) by mouth 2 (two) times daily. 03/22/22   Setzer, Edman Circle, PA-C  metoprolol tartrate (LOPRESSOR) 25 MG tablet Take 0.5 tablets (12.5 mg total) by mouth 2 (two) times daily. 03/22/22   Setzer, Edman Circle,  PA-C  Multiple Vitamin (MULTIVITAMIN WITH MINERALS) TABS tablet Take 1 tablet by mouth daily. 03/23/22   Setzer, Edman Circle, PA-C  ondansetron (ZOFRAN-ODT) 4 MG disintegrating tablet Take 1 tablet (4 mg total) by mouth every 8 (eight) hours as needed for nausea or vomiting. 02/21/22   Carolann Littler  W, MD  polyethylene glycol (MIRALAX / GLYCOLAX) packet Take 17 g by mouth daily as needed for moderate constipation.    [provider]  polyvinyl alcohol (LIQUIFILM TEARS) 1.4 % ophthalmic solution Place 2 drops into both eyes as needed for dry eyes (pleas eput at bedside for dry eyes). 03/22/22   Setzer, Edman Circle, PA-C  tamsulosin (FLOMAX) 0.4 MG CAPS capsule Take 2 capsules (0.8 mg total) by mouth daily after breakfast. 03/23/22   Setzer, Edman Circle, PA-C  triamcinolone cream (KENALOG) 0.1 % APPLY  CREAM EXTERNALLY TO AFFECTED AREA TWICE DAILY AS NEEDED Patient taking differently: 1 application. daily as needed (itching). 11/28/19   Burchette, Alinda Sierras, MD     Critical care time: 32 minutes     CRITICAL CARE Performed by: Otilio Carpen Johathon Overturf   Total critical care time: 32 minutes  Critical care time was exclusive of separately billable procedures and treating other patients.  Critical care was necessary to treat or prevent imminent or life-threatening deterioration.  Critical care was time spent personally by me on the following activities: development of treatment plan with patient and/or surrogate as well as nursing, discussions with consultants, evaluation of patient's response to treatment, examination of patient, obtaining history from patient or surrogate, ordering and performing treatments and interventions, ordering and review of laboratory studies, ordering and review of radiographic studies, pulse oximetry and re-evaluation of patient's condition.   Otilio Carpen Bailea Beed, PA-C [facility] See Amion for pager If no response to pager , please call [phone] until  7pm After 7:00 pm call Elink  [phone]?[place]

## 2022-05-02 NOTE — Progress Notes (Signed)
Subjective: Bleeding overnight in  urine and from wound.   Objective: Vital signs in last 24 hours: Temp:  [97.7 F (36.5 C)-98.1 F (36.7 C)] 98.1 F (36.7 C) (08/15 0725) Pulse Rate:  [33-111] 97 (08/15 0700) Resp:  [8-23] 14 (08/15 0700) BP: (74-127)/(34-101) 127/49 (08/15 0700) SpO2:  [83 %-100 %] 95 % (08/15 0700) Weight:  [77.1 kg-87.9 kg] 87.9 kg (08/15 0600) Last BM Date : 05/01/22  Intake/Output from previous day: 08/14 0701 - 08/15 0700 In: 1522.8 [P.O.:230; I.V.:790; IV Piggyback:502.9] Out: 1480 [Urine:1480] Intake/Output this shift: No intake/output data recorded.  PE: Skin: stage IV sacral wound with evidence of prior bleeding.  No active current bleeding noted.  Base of wound is clean, but superior aspect with loose eschar present.  No evidence of infection. GU: urine is frankly bloody  Lab Results:  Recent Labs    05/01/22 1244 05/02/22 0035 05/02/22 0448  WBC 7.1  --  6.4  HGB 7.7* 7.8* 7.3*  HCT 24.1* 23.6* 22.3*  PLT 240  --  231   BMET Recent Labs    05/01/22 1244 05/02/22 0448  NA 142 131*  K 3.5 3.3*  CL 115* 104  CO2 21* 20*  GLUCOSE 195* 466*  BUN 71* 61*  CREATININE 2.10* 1.87*  CALCIUM 7.7* 7.3*   PT/INR Recent Labs    05/01/22 1300  LABPROT 20.9*  INR 1.8*   CMP     Component Value Date/Time   NA 131 (L) 05/02/2022 0448   K 3.3 (L) 05/02/2022 0448   CL 104 05/02/2022 0448   CO2 20 (L) 05/02/2022 0448   GLUCOSE 466 (H) 05/02/2022 0448   BUN 61 (H) 05/02/2022 0448   CREATININE 1.87 (H) 05/02/2022 0448   CALCIUM 7.3 (L) 05/02/2022 0448   PROT 5.0 (L) 05/01/2022 1244   ALBUMIN <1.5 (L) 05/01/2022 1244   AST 45 (H) 05/01/2022 1244   ALT 10 05/01/2022 1244   ALKPHOS 69 05/01/2022 1244   BILITOT 0.5 05/01/2022 1244   GFRNONAA 33 (L) 05/02/2022 0448   GFRAA  04/26/2009 0920    >60        The eGFR has been calculated using the MDRD equation. This calculation has not been validated in all  clinical situations. eGFR's persistently <60 mL/min signify possible Chronic Kidney Disease.   Lipase  No results found for: "LIPASE"     Studies/Results: DG CHEST PORT 1 VIEW  Result Date: 05/01/2022 CLINICAL DATA:  Status post PICC line placement. EXAM: PORTABLE CHEST 1 VIEW COMPARISON:  May 01, 2022 (1:18 p.m.) FINDINGS: Since the prior study there is been interval placement of a right-sided PICC line. Its distal tip is approximately 2.5 cm distal to the junction of the superior vena cava and right atrium. The heart size and mediastinal contours are within normal limits. Diffuse, chronic appearing increased lung markings are seen with mild areas of atelectasis and/or infiltrate noted within the bilateral lung bases. Small bilateral pleural effusions are noted. No pneumothorax is identified. Radiopaque surgical clips are seen within the right upper quadrant. A chronic eighth right rib fracture is noted. IMPRESSION: 1. Right-sided PICC line positioning as described above. 2. Chronic appearing increased lung markings with mild bibasilar atelectasis and/or infiltrate. 3. Small bilateral pleural effusions. Electronically Signed   By: Virgina Norfolk M.D.   On: 05/01/2022 21:22   [place] EKG SITE RITE  Result Date: 05/01/2022 If Site Rite image not attached, placement could not be confirmed due  to current cardiac rhythm.  CT PELVIS WO CONTRAST  Result Date: 05/01/2022 CLINICAL DATA:  Sacral decubitus ulcer, further evaluate for abscess or deep infection EXAM: CT PELVIS WITHOUT CONTRAST TECHNIQUE: Multidetector CT imaging of the pelvis was performed following the standard protocol without intravenous contrast. RADIATION DOSE REDUCTION: This exam was performed according to the departmental dose-optimization program which includes automated exposure control, adjustment of the mA and/or kV according to patient size and/or use of iterative reconstruction technique. COMPARISON:  CT chest abdomen  pelvis, 02/22/2022 FINDINGS: Urinary Tract:  Urinary bladder decompressed by Foley catheter. Bowel: Evidence of prior rectosigmoid colon resection and reanastomosis. Large burden of stool in the included colon. Vascular/Lymphatic: No pathologically enlarged lymph nodes. Aortic atherosclerosis Reproductive:  No mass or other significant abnormality Other:  Anasarca. Musculoskeletal: No suspicious bone lesions identified. Sacral decubitus ulceration with tunneling subcutaneous air and fluid collection measuring at least 7.6 x 3.8 x 2.4 cm (series 3, image 35, series 6, image 98). Overlying dressing material. No significant bony erosion or sclerosis of the underlying sacrum or coccyx. IMPRESSION: 1. Sacral decubitus ulceration with tunneling subcutaneous air and fluid collection measuring at least 7.6 x 3.8 x 2.4 cm. Overlying dressing material. 2. No significant bony erosion or sclerosis of the underlying sacrum or coccyx. MRI is the test of choice for the evaluation of osteomyelitis if suspected. 3. Anasarca. 4. Evidence of prior rectosigmoid colon resection and reanastomosis. Large burden of stool in the included colon. Aortic Atherosclerosis (ICD10-I70.0). Electronically Signed   By: Delanna Ahmadi M.D.   On: 05/01/2022 16:01   DG Chest Port 1 View  Result Date: 05/01/2022 CLINICAL DATA:  Questionable sepsis. Sacral wound limits positioning by report from the technologists. EXAM: PORTABLE CHEST 1 VIEW COMPARISON:  February 28, 2022 FINDINGS: Lung volumes are diminished compared to previous imaging and images rotated slightly to the RIGHT. Accounting for this cardiomediastinal contours and hilar structures are stable. There is graded opacity at the RIGHT and LEFT lung base that is increased since previous imaging. Added density over the LEFT chest is likely due in part to overlapping soft tissues. No lobar consolidative process. On limited assessment no acute skeletal findings. IMPRESSION: 1. Increased density over  the LEFT chest favored to represent overlapping soft tissues. Lung volumes are diminished compared to previous imaging with potential increase in pleural fluid since the prior study. 2. No signs of lobar consolidation. Electronically Signed   By: Zetta Bills M.D.   On: 05/01/2022 13:32    Anti-infectives: Anti-infectives (From admission, onward)    Start     Dose/Rate Route Frequency Ordered Stop   05/03/22 1345  vancomycin (VANCOREADY) IVPB 1250 mg/250 mL        1,250 mg 166.7 mL/hr over 90 Minutes Intravenous Every 48 hours 05/01/22 1344     05/01/22 1345  vancomycin (VANCOREADY) IVPB 1500 mg/300 mL        1,500 mg 150 mL/hr over 120 Minutes Intravenous  Once 05/01/22 1344 05/01/22 1653   05/01/22 1300  cefTRIAXone (ROCEPHIN) 2 g in sodium chloride 0.9 % 100 mL IVPB        2 g 200 mL/hr over 30 Minutes Intravenous Every 24 hours 05/01/22 1245     05/01/22 1245  vancomycin (VANCOCIN) IVPB 1000 mg/200 mL premix  Status:  Discontinued        1,000 mg 200 mL/hr over 60 Minutes Intravenous  Once 05/01/22 1240 05/01/22 1344   05/01/22 1245  aztreonam (AZACTAM) 2 g in sodium chloride  0.9 % 100 mL IVPB  Status:  Discontinued        2 g 200 mL/hr over 30 Minutes Intravenous  Once 05/01/22 1240 05/01/22 1245        Assessment/Plan Stage IV sacral wound -could offer bedside debridement once anticoagulation worn off in a couple of days, but to align with GOC and family wishes. -BID WD dressing changes for now. -hold eliquis and heparin for now  I reviewed hospitalist notes, last 24 h vitals and pain scores, last 48 h intake and output, last 24 h labs and trends, and last 24 h imaging results.   LOS: 1 day    Steven Ward , [facility] Surgery 05/02/2022, 8:51 AM Please see Amion for pager number during day hours 7:00am-4:30pm or 7:00am -11:30am on weekends

## 2022-05-03 DIAGNOSIS — A419 Sepsis, unspecified organism: Secondary | ICD-10-CM | POA: Diagnosis not present

## 2022-05-03 DIAGNOSIS — R6521 Severe sepsis with septic shock: Secondary | ICD-10-CM | POA: Diagnosis not present

## 2022-05-03 LAB — CBC
HCT: 26.6 % — ABNORMAL LOW (ref 39.0–52.0)
Hemoglobin: 9 g/dL — ABNORMAL LOW (ref 13.0–17.0)
MCH: 33.1 pg (ref 26.0–34.0)
MCHC: 33.8 g/dL (ref 30.0–36.0)
MCV: 97.8 fL (ref 80.0–100.0)
Platelets: 212 10*3/uL (ref 150–400)
RBC: 2.72 MIL/uL — ABNORMAL LOW (ref 4.22–5.81)
RDW: 15.9 % — ABNORMAL HIGH (ref 11.5–15.5)
WBC: 8.1 10*3/uL (ref 4.0–10.5)
nRBC: 0 % (ref 0.0–0.2)

## 2022-05-03 LAB — TYPE AND SCREEN
ABO/RH(D): A NEG
Antibody Screen: NEGATIVE
Unit division: 0

## 2022-05-03 LAB — GLUCOSE, CAPILLARY
Glucose-Capillary: 85 mg/dL (ref 70–99)
Glucose-Capillary: 82 mg/dL (ref 70–99)
Glucose-Capillary: 103 mg/dL — ABNORMAL HIGH (ref 70–99)
Glucose-Capillary: 99 mg/dL (ref 70–99)
Glucose-Capillary: 137 mg/dL — ABNORMAL HIGH (ref 70–99)
Glucose-Capillary: 77 mg/dL (ref 70–99)

## 2022-05-03 LAB — URINE CULTURE: Culture: >=100000 — AB

## 2022-05-03 LAB — BPAM RBC
Blood Product Expiration Date: 202308272359
ISSUE DATE / TIME: 202308151217
Unit Type and Rh: 600

## 2022-05-03 LAB — MAGNESIUM: Magnesium: 2.1 mg/dL (ref 1.7–2.4)

## 2022-05-03 LAB — BASIC METABOLIC PANEL
Anion gap: 5 (ref 5–15)
BUN: 55 mg/dL — ABNORMAL HIGH (ref 8–23)
CO2: 22 mmol/L (ref 22–32)
Calcium: 7.8 mg/dL — ABNORMAL LOW (ref 8.9–10.3)
Chloride: 113 mmol/L — ABNORMAL HIGH (ref 98–111)
Creatinine, Ser: 1.67 mg/dL — ABNORMAL HIGH (ref 0.61–1.24)
GFR, Estimated: 37 mL/min — ABNORMAL LOW (ref >60)
Glucose, Bld: 65 mg/dL — ABNORMAL LOW (ref 70–99)
Potassium: 3.7 mmol/L (ref 3.5–5.1)
Sodium: 140 mmol/L (ref 135–145)

## 2022-05-03 LAB — PHOSPHORUS: Phosphorus: 2.4 mg/dL — ABNORMAL LOW (ref 2.5–4.6)

## 2022-05-03 MED ORDER — INSULIN ASPART 100 UNIT/ML IJ SOLN: 0.0000 [IU] | INTRAMUSCULAR | Status: DC

## 2022-05-03 NOTE — Progress Notes (Signed)
Hypoglycemic Event  CBG: 59  Treatment: 4 oz juice/soda  Symptoms: None  Follow-up CBG: Time:0015 CBG Result:77  Possible Reasons for Event: Inadequate meal intake  Comments/MD notified:    Kathyrn Lass

## 2022-05-03 NOTE — Progress Notes (Incomplete)
Hypoglycemic Event  CBG: 59  Treatment: 4 oz juice/soda  Symptoms: None  Follow-up CBG: Time:0015 CBG Result:***  Possible Reasons for Event: Inadequate meal intake  Comments/MD notified:    Kathyrn Lass

## 2022-05-03 NOTE — Progress Notes (Signed)
NAME:  Steven Ward, MRN:  [id number], DOB:  1925/03/06, LOS: 2 ADMISSION DATE:  05/01/2022, CONSULTATION DATE:  05/03/22 REFERRING MD:  EDP, CHIEF COMPLAINT:   sacral wound  History of Present Illness:  Steven Ward is a 88 y.o. M with PMH significant for R frontal lobe and cerebellar CVA in 02/2021, DM , parkinson's, colon Ca, Afib on xarelto who has been at rehab and then SNF since his stroke who presents today after staff noticed a large sacral decubitus ulcer and foul smelling drainage.  His son was not aware that this had progressed rapidly.  States that pt has been largely bed-bound secondary to weakness and is not successfully working with physical therapy.   Work-up in the ED revealed sacral subcutaneous air and fluid collection and lactic acid 3.4, Creatinine 2.1, Hgb 7.7.  His blood pressure was low despite IVF and antibiotics and PCCM was consulted for admission.   Pt denies pain or fever  Pertinent  Medical History   has a past medical history of ABNORMAL THYROID FUNCTION TESTS (01/17/2010), ALLERGIC RHINITIS (12/01/2008), Colon cancer ([place]), COLONIC POLYPS, HX OF (12/01/2008), DIABETES MELLITUS, TYPE II (12/01/2008), EDEMA LEG (05/04/2009), ESSENTIAL HYPERTENSION (12/01/2008), Exocrine pancreatic insufficiency, GALLSTONES (04/07/2009), GERD (12/01/2008), Hay fever, and SPONDYLOSIS, LUMBAR (12/01/2008).   Significant Hospital Events: Including procedures, antibiotic start and stop dates in addition to other pertinent events   8/14 admit to PCCM with septic shock from sacral decubitus ulcer, started on Levophed, vanc and cefepime 8/15 Pressor requirement coming down, no further bleeding, Hgb responded to transfusion  Interim History / Subjective:  No overnight events, no further bleeding, Levophed requirement down-trending, no complaints, renal function improving  Objective   Blood pressure (!) 126/46, pulse 94, temperature 98.1 F (36.7 C), temperature source Oral, resp. rate 13, height  _0  (1.727 m), weight 86.1 kg, SpO2 99 %.        Intake/Output Summary (Last 24 hours) at 05/03/2022 0810 Last data filed at 05/03/2022 0500 Gross per 24 hour  Intake 771.18 ml  Output 1710 ml  Net -938.82 ml    Filed Weights   05/01/22 1151 05/02/22 0600 05/03/22 0524  Weight: 77.1 kg 87.9 kg 86.1 kg    General:  thin, elderly, chronically ill-appearing M in no distress, no complaints HEENT: MM pink/moist, sclera anicteric Neuro: awake, answering questions appropriately, follows commands CV: s1s2, irregular, no m/r/g PULM:  lungs clear bilaterally on RA without distress GI/GU soft, mildly distended, chronic foley, no hematuria  Extremities: warm/dry, 3+ pitting edema  Skin: no rashes or lesions, unstageable sacral decubitus ulcer with foul smelling drainage as pictured, dressed no bleeding     Resolved Hospital Problem list     Assessment & Plan:   Septic Shock secondary to Sacral Decubitus ulcer  With underlying gas and 7x3x2 fluid collection on CT -admit to ICU for pressors and broad spectrum antibiotics, Vancomycin and Cefepime -follow blood cultures, no growth thus far -continue Levophed to maintain MAP >65, has PICC, try to decrease today -lactic improved -seen by surgery, no urgent surgery indicated, may need bedside debridement once off Eliquis for 48 hrs -wound care following   Acute Renal Failure superimposed on CKD 3b GRF 28 and creatinine 1.6 -good UOP, creatinine improving   Hypokalemia K 3.7, mag 2.1 -continue to replete and follow   Type 2 DM Hypocalcemia Blood sugars labile, up to 500 yesterday, 65 today -hold home Glipizide -SSI change back to sensitive -continue home long acting, pt is eating, may  need to hold if sugars remain low  Anemia Acute on chronic likely slightly worsening with bleeding from wound on heparin gtt,  -improved after transfusion -continue to follow CBC and monitor for bleeding, hold heparin in anticipation of possible  debridement  Atrial Fibrillation -hold metoprolol in the setting of hypotension -hold DOAC as may need debridement -heparin gtt held overnight 2/2 to bleeding -rate currently controlled   Hypothyroidism -continue synthroid     Best Practice (right click and "Reselect all SmartList Selections" daily)   Diet/type: dysphagia diet (see orders) DVT prophylaxis: SCD GI prophylaxis: N/A Lines: Central line PICC  Foley:  Yes, and it is still needed Code Status:  DNR Last date of multidisciplinary goals of care discussion [Discussed with son at the bedside, confirms DNR, pressors and ICU are ok]  Son updated yesterday via phone, will contact again today  Labs   CBC: Recent Labs  Lab 05/01/22 1244 05/02/22 0035 05/02/22 0448 05/02/22 0856 05/02/22 1737 05/03/22 0507  WBC 7.1  --  6.4 6.9  --  8.1  NEUTROABS 5.3  --   --   --   --   --   HGB 7.7* 7.8* 7.3* 7.1* 9.7* 9.0*  HCT 24.1* 23.6* 22.3* 22.6* 29.1* 26.6*  MCV 101.3*  --  100.0 105.1*  --  97.8  PLT 240  --  231 236  --  212     Basic Metabolic Panel: Recent Labs  Lab 05/01/22 1244 05/02/22 0448 05/03/22 0507  NA 142 131* 140  K 3.5 3.3* 3.7  CL 115* 104 113*  CO2 21* 20* 22  GLUCOSE 195* 466* 65*  BUN 71* 61* 55*  CREATININE 2.10* 1.87* 1.67*  CALCIUM 7.7* 7.3* 7.8*  MG  --  1.8 2.1  PHOS  --  2.5 2.4*    GFR: Estimated Creatinine Clearance: 28.3 mL/min (A) (by C-G formula based on SCr of 1.67 mg/dL (H)). Recent Labs  Lab 05/01/22 1244 05/01/22 1700 05/02/22 0448 05/02/22 0856 05/03/22 0507  WBC 7.1  --  6.4 6.9 8.1  LATICACIDVEN 3.4* 2.6*  --   --   --      Liver Function Tests: Recent Labs  Lab 05/01/22 1244  AST 45*  ALT 10  ALKPHOS 69  BILITOT 0.5  PROT 5.0*  ALBUMIN <1.5*    No results for input(s): "LIPASE", "AMYLASE" in the last 168 hours. No results for input(s): "AMMONIA" in the last 168 hours.  ABG No results found for: "PHART", "PCO2ART", "PO2ART", "HCO3", "TCO2",  "ACIDBASEDEF", "O2SAT"   Coagulation Profile: Recent Labs  Lab 05/01/22 1300  INR 1.8*     Cardiac Enzymes: No results for input(s): "CKTOTAL", "CKMB", "CKMBINDEX", "TROPONINI" in the last 168 hours.  HbA1C: Hemoglobin A1C  Date/Time Value Ref Range Status  02/08/2022 10:17 AM 7.7 (A) 4.0 - 5.6 % Final  11/09/2021 10:40 AM 6.0 (A) 4.0 - 5.6 % Final   Hgb A1c MFr Bld  Date/Time Value Ref Range Status  02/24/2022 03:37 AM 7.9 (H) 4.8 - 5.6 % Final    Comment:    (NOTE) Pre diabetes:          5.7%-6.4%  Diabetes:              >6.4%  Glycemic control for   <7.0% adults with diabetes   04/19/2016 10:20 AM 6.8 (H) 4.6 - 6.5 % Final    Comment:    Glycemic Control Guidelines for People with Diabetes:Non Diabetic:  <6%Goal of Therapy: <7%Additional  Action Suggested:  >8%     CBG: Recent Labs  Lab 05/02/22 1101 05/02/22 1536 05/02/22 1921 05/02/22 2315 05/03/22 0319  GLUCAP 195* 83 98 155* 85     Review of Systems:   Please see the history of present illness. All other systems reviewed and are negative    Past Medical History:  He,  has a past medical history of ABNORMAL THYROID FUNCTION TESTS (01/17/2010), ALLERGIC RHINITIS (12/01/2008), Colon cancer ([place]), COLONIC POLYPS, HX OF (12/01/2008), DIABETES MELLITUS, TYPE II (12/01/2008), EDEMA LEG (05/04/2009), ESSENTIAL HYPERTENSION (12/01/2008), Exocrine pancreatic insufficiency, GALLSTONES (04/07/2009), GERD (12/01/2008), Hay fever, and SPONDYLOSIS, LUMBAR (12/01/2008).   Surgical History:   Past Surgical History:  Procedure Laterality Date   ANKLE SURGERY Right    CATARACT EXTRACTION     CHOLECYSTECTOMY     COLON SURGERY     resection 1990 for cancer   LEG SURGERY Left      Social History:   reports that he quit smoking about 53 years ago. His smoking use included cigarettes. He has a 20.00 pack-year smoking history. He has never used smokeless tobacco. He reports that he does not drink alcohol and does not use drugs.    Family History:  His family history includes Cancer in his mother and another family member; Diabetes in an other family member; Stroke in an other family member.   Allergies Allergies  Allergen Reactions   Amoxicillin     REACTION: rash, dizziness     Home Medications  Prior to Admission medications   Medication Sig Start Date End Date Taking? Authorizing Provider  apixaban (ELIQUIS) 2.5 MG TABS tablet Take 1 tablet (2.5 mg total) by mouth 2 (two) times daily. 03/22/22   Setzer, Edman Circle, PA-C  blood glucose meter kit and supplies KIT Dispense based on patient and insurance preference. Use up to four times daily as directed. (FOR ICD-9 250.00, 250.01). 10/03/17   Burchette, Alinda Sierras, MD  Blood Glucose Monitoring Suppl (ONE TOUCH ULTRA 2) w/Device KIT Use as directed. 05/10/21   Burchette, Alinda Sierras, MD  Carbidopa-Levodopa ER (SINEMET CR) 25-100 MG tablet controlled release Take 1 tablet by mouth 2 (two) times daily with breakfast and lunch. 03/22/22   Setzer, Edman Circle, PA-C  Carbidopa-Levodopa ER (SINEMET CR) 25-100 MG tablet controlled release Take 1 tablet by mouth every evening. 03/22/22   Setzer, Edman Circle, PA-C  collagenase (SANTYL) 250 UNIT/GM ointment Apply topically daily. 03/22/22   Setzer, Edman Circle, PA-C  furosemide (LASIX) 20 MG tablet Take 1 tablet (20 mg total) by mouth daily. 03/23/22   Setzer, Edman Circle, PA-C  glipiZIDE (GLUCOTROL) 5 MG tablet Take one half tablet by mouth once daily. Patient taking differently: Take 5 mg by mouth daily before breakfast. 02/08/22   Burchette, Alinda Sierras, MD  glucose blood (ONETOUCH ULTRA) test strip USE TO TEST BLOOD SUGAR 3 TIMES A DAY 01/25/22   Burchette, Alinda Sierras, MD  Lancet Device MISC Use 1-4 times daily as needed or directed.  DX E11.9 11/01/20   Burchette, Alinda Sierras, MD  Lancets [facility] ULTRASOFT) lancets Check 3 times daily. E11.9 11/30/20   Burchette, Alinda Sierras, MD  levothyroxine (SYNTHROID) 25 MCG tablet Take 1 tablet (25 mcg total) by mouth daily.  03/22/22   Setzer, Edman Circle, PA-C  loratadine (CLARITIN) 10 MG tablet Take 1 tablet (10 mg total) by mouth daily as needed for allergies or rhinitis. 03/22/22   Setzer, Edman Circle, PA-C  magnesium oxide (MAG-OX) 400 (240 Mg) MG tablet  Take 1 tablet (400 mg total) by mouth 2 (two) times daily. 03/22/22   Setzer, Edman Circle, PA-C  metoprolol tartrate (LOPRESSOR) 25 MG tablet Take 0.5 tablets (12.5 mg total) by mouth 2 (two) times daily. 03/22/22   Setzer, Edman Circle, PA-C  Multiple Vitamin (MULTIVITAMIN WITH MINERALS) TABS tablet Take 1 tablet by mouth daily. 03/23/22   Setzer, Edman Circle, PA-C  ondansetron (ZOFRAN-ODT) 4 MG disintegrating tablet Take 1 tablet (4 mg total) by mouth every 8 (eight) hours as needed for nausea or vomiting. 02/21/22   Burchette, Alinda Sierras, MD  polyethylene glycol (MIRALAX / Floria Raveling) packet Take 17 g by mouth daily as needed for moderate constipation.    [provider]  polyvinyl alcohol (LIQUIFILM TEARS) 1.4 % ophthalmic solution Place 2 drops into both eyes as needed for dry eyes (pleas eput at bedside for dry eyes). 03/22/22   Setzer, Edman Circle, PA-C  tamsulosin (FLOMAX) 0.4 MG CAPS capsule Take 2 capsules (0.8 mg total) by mouth daily after breakfast. 03/23/22   Setzer, Edman Circle, PA-C  triamcinolone cream (KENALOG) 0.1 % APPLY  CREAM EXTERNALLY TO AFFECTED AREA TWICE DAILY AS NEEDED Patient taking differently: 1 application. daily as needed (itching). 11/28/19   Burchette, Alinda Sierras, MD     Critical care time: 35 minutes     CRITICAL CARE Performed by: Otilio Carpen Loui [place]   Total critical care time: 35 minutes  Critical care time was exclusive of separately billable procedures and treating other patients.  Critical care was necessary to treat or prevent imminent or life-threatening deterioration.  Critical care was time spent personally by me on the following activities: development of treatment plan with patient and/or surrogate as well as nursing, discussions with consultants,  evaluation of patient's response to treatment, examination of patient, obtaining history from patient or surrogate, ordering and performing treatments and interventions, ordering and review of laboratory studies, ordering and review of radiographic studies, pulse oximetry and re-evaluation of patient's condition.   Otilio Carpen Ramiel Forti, PA-C [facility] See Amion for pager If no response to pager , please call [phone] until 7pm After 7:00 pm call Elink  [phone]

## 2022-05-04 DIAGNOSIS — A419 Sepsis, unspecified organism: Secondary | ICD-10-CM | POA: Diagnosis not present

## 2022-05-04 DIAGNOSIS — R6521 Severe sepsis with septic shock: Secondary | ICD-10-CM | POA: Diagnosis not present

## 2022-05-04 LAB — GLUCOSE, CAPILLARY
Glucose-Capillary: 101 mg/dL — ABNORMAL HIGH (ref 70–99)
Glucose-Capillary: 107 mg/dL — ABNORMAL HIGH (ref 70–99)
Glucose-Capillary: 119 mg/dL — ABNORMAL HIGH (ref 70–99)
Glucose-Capillary: 217 mg/dL — ABNORMAL HIGH (ref 70–99)
Glucose-Capillary: 227 mg/dL — ABNORMAL HIGH (ref 70–99)
Glucose-Capillary: 59 mg/dL — ABNORMAL LOW (ref 70–99)
Glucose-Capillary: 65 mg/dL — ABNORMAL LOW (ref 70–99)
Glucose-Capillary: 77 mg/dL (ref 70–99)
Glucose-Capillary: 81 mg/dL (ref 70–99)

## 2022-05-04 LAB — BASIC METABOLIC PANEL
Anion gap: 6 (ref 5–15)
BUN: 51 mg/dL — ABNORMAL HIGH (ref 8–23)
CO2: 21 mmol/L — ABNORMAL LOW (ref 22–32)
Calcium: 7.7 mg/dL — ABNORMAL LOW (ref 8.9–10.3)
Chloride: 113 mmol/L — ABNORMAL HIGH (ref 98–111)
Creatinine, Ser: 1.72 mg/dL — ABNORMAL HIGH (ref 0.61–1.24)
GFR, Estimated: 36 mL/min — ABNORMAL LOW (ref >60)
Glucose, Bld: 80 mg/dL (ref 70–99)
Potassium: 3.8 mmol/L (ref 3.5–5.1)
Sodium: 140 mmol/L (ref 135–145)

## 2022-05-04 LAB — CBC
HCT: 27.7 % — ABNORMAL LOW (ref 39.0–52.0)
Hemoglobin: 9.1 g/dL — ABNORMAL LOW (ref 13.0–17.0)
MCH: 32.6 pg (ref 26.0–34.0)
MCHC: 32.9 g/dL (ref 30.0–36.0)
MCV: 99.3 fL (ref 80.0–100.0)
Platelets: 210 10*3/uL (ref 150–400)
RBC: 2.79 MIL/uL — ABNORMAL LOW (ref 4.22–5.81)
RDW: 15.9 % — ABNORMAL HIGH (ref 11.5–15.5)
WBC: 8.7 10*3/uL (ref 4.0–10.5)
nRBC: 0 % (ref 0.0–0.2)

## 2022-05-04 LAB — MAGNESIUM: Magnesium: 2 mg/dL (ref 1.7–2.4)

## 2022-05-04 MED ORDER — ADULT MULTIVITAMIN W/MINERALS CH
1.0000 | ORAL_TABLET | Freq: Every day | ORAL | Status: DC
  Administered 2022-05-04 – 2022-05-10 (×7): 1 via ORAL
  Filled 2022-05-04 (×7): qty 1

## 2022-05-04 MED ORDER — ENSURE ENLIVE PO LIQD
237.0000 mL | Freq: Three times a day (TID) | ORAL | Status: DC
  Administered 2022-05-04 – 2022-05-10 (×13): 237 mL via ORAL

## 2022-05-04 MED ORDER — INSULIN GLARGINE-YFGN 100 UNIT/ML ~~LOC~~ SOLN
8.0000 [IU] | Freq: Every day | SUBCUTANEOUS | Status: DC
  Filled 2022-05-04: qty 0.08

## 2022-05-04 MED ORDER — MIDODRINE HCL 5 MG PO TABS
10.0000 mg | ORAL_TABLET | Freq: Three times a day (TID) | ORAL | Status: DC
  Administered 2022-05-04 – 2022-05-10 (×19): 10 mg via ORAL
  Filled 2022-05-04 (×20): qty 2

## 2022-05-04 MED ORDER — ALBUMIN HUMAN 5 % IV SOLN
12.5000 g | Freq: Once | INTRAVENOUS | Status: AC
  Administered 2022-05-04: 12.5 g via INTRAVENOUS
  Filled 2022-05-04: qty 250

## 2022-05-04 MED ORDER — POTASSIUM CHLORIDE 20 MEQ PO PACK
40.0000 meq | PACK | Freq: Once | ORAL | Status: AC
Start: 2022-05-04 — End: 2022-05-04
  Administered 2022-05-04: 40 meq via ORAL
  Filled 2022-05-04: qty 2

## 2022-05-04 MED ORDER — ASCORBIC ACID 500 MG PO TABS
500.0000 mg | ORAL_TABLET | Freq: Two times a day (BID) | ORAL | Status: DC
  Administered 2022-05-04 – 2022-05-10 (×12): 500 mg via ORAL
  Filled 2022-05-04 (×12): qty 1

## 2022-05-04 MED ORDER — ZINC SULFATE 220 (50 ZN) MG PO CAPS
220.0000 mg | ORAL_CAPSULE | Freq: Every day | ORAL | Status: DC
  Administered 2022-05-04 – 2022-05-10 (×7): 220 mg via ORAL
  Filled 2022-05-04 (×7): qty 1

## 2022-05-04 MED ORDER — ENSURE ENLIVE PO LIQD
237.0000 mL | Freq: Two times a day (BID) | ORAL | Status: DC
  Administered 2022-05-04 (×2): 237 mL via ORAL

## 2022-05-04 NOTE — Progress Notes (Signed)
Pharmacy Antibiotic Note  Steven Ward is a 88 y.o. male admitted on 05/01/2022 with sepsis 2/2 decubitus ulcer.  Pharmacy has been consulted for vancomycin dosing. Also on CTX. Planning bedside debridement.  Plan: Continue vancomycin '1250mg'$  IV q48h Continue ceftriaxone 2g IV q24h Follow Cr, Cx, LOT  Vancomycin levels at Css  Height: '5\' 8"'$  (172.7 cm) Weight: 86.1 kg (189 lb 13.1 oz) IBW/kg (Calculated) : 68.4  Temp (24hrs), Avg:98 F (36.7 C), Min:97.6 F (36.4 C), Max:98.4 F (36.9 C)  Recent Labs  Lab 05/01/22 1244 05/01/22 1700 05/02/22 0448 05/02/22 0856 05/03/22 0507 05/04/22 0355  WBC 7.1  --  6.4 6.9 8.1 8.7  CREATININE 2.10*  --  1.87*  --  1.67* 1.72*  LATICACIDVEN 3.4* 2.6*  --   --   --   --      Estimated Creatinine Clearance: 27.4 mL/min (A) (by C-G formula based on SCr of 1.72 mg/dL (H)).    Allergies  Allergen Reactions   Amoxicillin     REACTION: rash, dizziness    Thank you for allowing pharmacy to be a part of this patient's care.  Arrie Senate, PharmD, BCPS, [facility] Clinical Pharmacist [phone] Please check AMION for all [place] numbers 05/04/2022

## 2022-05-04 NOTE — Consult Note (Signed)
   [facility] CM Inpatient Consult   05/04/2022  DEVEN FURIA Feb 04, 1925 [id number]   [place] Organization [ACO] Patient:  [employer]   Primary Care Provider: [name], MD, [facility], is an Independent Embedded provider with a Care Management team and program and is listed for the [facility] follow up needs     Patient screened for lhospitalization with noted extreme high risk score for unplanned readmission risk.  Reviewed to assess for potential [place] Management with Embedded provider service needs for post hospital transition for readmission prevention needs.  Review of patient's medical record reveals patient is currently listed at ICU level of care. Disposition and needs for progress to transition will be followed.  Patient has noted engagement with Embedded CCM pharmacist prior to admission.   Plan:  Continue to follow progress and disposition to assess for post hospital care management needs.  Referral request for post hospital needs to be determined, as appropriate.   For questions contact:    Natividad Brood, RN BSN [facility] Liaison  [phone] business mobile phone Toll free office [phone]  Fax number: [phone] [email or web address]

## 2022-05-04 NOTE — Progress Notes (Addendum)
NAME:  Steven Ward, MRN:  [id number], DOB:  [birth year], LOS: 3 ADMISSION DATE:  05/01/2022, CONSULTATION DATE:  05/04/22 REFERRING MD:  EDP, CHIEF COMPLAINT:   sacral wound  History of Present Illness:  Steven Ward is a 88 y.o. M with PMH significant for R frontal lobe and cerebellar CVA in 02/2021, DM , parkinson's, colon Ca, Afib on xarelto who has been at rehab and then SNF since his stroke who presents today after staff noticed a large sacral decubitus ulcer and foul smelling drainage.  His son was not aware that this had progressed rapidly.  States that pt has been largely bed-bound secondary to weakness and is not successfully working with physical therapy.   Work-up in the ED revealed sacral subcutaneous air and fluid collection and lactic acid 3.4, Creatinine 2.1, Hgb 7.7.  His blood pressure was low despite IVF and antibiotics and PCCM was consulted for admission.   Pt denies pain or fever  Pertinent  Medical History   has a past medical history of ABNORMAL THYROID FUNCTION TESTS (01/17/2010), ALLERGIC RHINITIS (12/01/2008), Colon cancer ([place]), COLONIC POLYPS, HX OF (12/01/2008), DIABETES MELLITUS, TYPE II (12/01/2008), EDEMA LEG (05/04/2009), ESSENTIAL HYPERTENSION (12/01/2008), Exocrine pancreatic insufficiency, GALLSTONES (04/07/2009), GERD (12/01/2008), Hay fever, and SPONDYLOSIS, LUMBAR (12/01/2008).   Significant Hospital Events: Including procedures, antibiotic start and stop dates in addition to other pertinent events   8/14 admit to PCCM with septic shock from sacral decubitus ulcer, started on Levophed, vanc and cefepime 8/15 Pressor requirement coming down, no further bleeding, Hgb responded to transfusion  Interim History / Subjective:  Hypoglycemic overnight Remains on NE 8 No complaints  Objective   Blood pressure 93/76, pulse 90, temperature 97.6 F (36.4 C), temperature source Oral, resp. rate 17, height '5\' 8"'$  (1.727 m), weight 86.1 kg, SpO2 92 %.         Intake/Output Summary (Last 24 hours) at 05/04/2022 0737 Last data filed at 05/04/2022 0700 Gross per 24 hour  Intake 553.98 ml  Output 1605 ml  Net -1051.02 ml   Filed Weights   05/01/22 1151 05/02/22 0600 05/03/22 0524  Weight: 77.1 kg 87.9 kg 86.1 kg   General:  chronically ill elderly male lying in bed in NAD watching TV and picking at breakfast HEENT: MM pink/moist, edentulous, pupils 2/ reactive, anicteric   Neuro: Awake, oriented person, month/year, place, MAE- generalized weakness CV: irir, afib, rate 80-90, no murmur, dopplered pedal pulses PULM:  non labored, room air, CTA GI: soft, bs+, NT, foley pale amber Extremities: warm/dry, generalized edema worse in LE 3+, sacral dressing intact  Afebrile UOP 1.6L/ 24hrs Net -1.8L   Labs reviewed > K 3.8, bicarb 21, sCr 1.67> 1.72, BUN 55> 51, mag 2, WBC 8.7, H/H 9/ 26.6 > 9.1> 27.7   Resolved Hospital Problem list     Assessment & Plan:   Septic Shock secondary to Sacral Decubitus ulcer  With underlying gas and 7x3x2 fluid collection on CT - continue NE for MAP goal > 65 - albumin x 1, if no improvement consider adding midodrine to come off drip - BC ngtd x 3 days, UC > 100k colonies of diptheroids - continue ceftriaxone/ vanc day 4/x - surgery following, off heparin x 2 days> ? Bedside debridement  - WOC following   Acute Renal Failure superimposed on CKD 3b - UOP and sCr stable - trend renal indices, avoid nephrotoxins  Hypokalemia K3.8, mag ok -goal K>4 and Mag > 2, replete as needed   Type 2  DM Hypocalcemia Blood sugars labile, up to 500 yesterday, 65 today -continue to hold home Glipizide -hold SSI, reduce semglee to 8 units daily, if BG still liable will need to stop long acting and just switch to sensitive SSI -adding protein shakes to diet   Anemia - CBC stable - no further evidence of bleeding,    Atrial Fibrillation -hold metoprolol in the setting of hypotension -hold DOAC as may need  debridement -heparin on hold for possible debridement -rate remains controlled  Hypothyroidism -continue synthroid  Best Practice (right click and "Reselect all SmartList Selections" daily)   Diet/type: dysphagia diet (see orders)- s/p SLP eval 8/16 DVT prophylaxis: SCD GI prophylaxis: N/A Lines: Central line PICC  Foley:  Yes, and it is still needed Code Status:  DNR Last date of multidisciplinary goals of care discussion [Discussed with son at the bedside, confirms DNR, pressors and ICU are ok]  Son updated yesterday via phone, will contact again today  Pending 8/17  Labs   CBC: Recent Labs  Lab 05/01/22 1244 05/02/22 0035 05/02/22 0448 05/02/22 0856 05/02/22 1737 05/03/22 0507 05/04/22 0355  WBC 7.1  --  6.4 6.9  --  8.1 8.7  NEUTROABS 5.3  --   --   --   --   --   --   HGB 7.7*   < > 7.3* 7.1* 9.7* 9.0* 9.1*  HCT 24.1*   < > 22.3* 22.6* 29.1* 26.6* 27.7*  MCV 101.3*  --  100.0 105.1*  --  97.8 99.3  PLT 240  --  231 236  --  212 210   < > = values in this interval not displayed.    Basic Metabolic Panel: Recent Labs  Lab 05/01/22 1244 05/02/22 0448 05/03/22 0507 05/04/22 0355  NA 142 131* 140 140  K 3.5 3.3* 3.7 3.8  CL 115* 104 113* 113*  CO2 21* 20* 22 21*  GLUCOSE 195* 466* 65* 80  BUN 71* 61* 55* 51*  CREATININE 2.10* 1.87* 1.67* 1.72*  CALCIUM 7.7* 7.3* 7.8* 7.7*  MG  --  1.8 2.1 2.0  PHOS  --  2.5 2.4*  --    GFR: Estimated Creatinine Clearance: 27.4 mL/min (A) (by C-G formula based on SCr of 1.72 mg/dL (H)). Recent Labs  Lab 05/01/22 1244 05/01/22 1700 05/02/22 0448 05/02/22 0856 05/03/22 0507 05/04/22 0355  WBC 7.1  --  6.4 6.9 8.1 8.7  LATICACIDVEN 3.4* 2.6*  --   --   --   --     Liver Function Tests: Recent Labs  Lab 05/01/22 1244  AST 45*  ALT 10  ALKPHOS 69  BILITOT 0.5  PROT 5.0*  ALBUMIN <1.5*   No results for input(s): "LIPASE", "AMYLASE" in the last 168 hours. No results for input(s): "AMMONIA" in the last 168  hours.  ABG No results found for: "PHART", "PCO2ART", "PO2ART", "HCO3", "TCO2", "ACIDBASEDEF", "O2SAT"   Coagulation Profile: Recent Labs  Lab 05/01/22 1300  INR 1.8*    Cardiac Enzymes: No results for input(s): "CKTOTAL", "CKMB", "CKMBINDEX", "TROPONINI" in the last 168 hours.  HbA1C: Hemoglobin A1C  Date/Time Value Ref Range Status  02/08/2022 10:17 AM 7.7 (A) 4.0 - 5.6 % Final  11/09/2021 10:40 AM 6.0 (A) 4.0 - 5.6 % Final   Hgb A1c MFr Bld  Date/Time Value Ref Range Status  02/24/2022 03:37 AM 7.9 (H) 4.8 - 5.6 % Final    Comment:    (NOTE) Pre diabetes:  5.7%-6.4%  Diabetes:              >6.4%  Glycemic control for   <7.0% adults with diabetes   04/19/2016 10:20 AM 6.8 (H) 4.6 - 6.5 % Final    Comment:    Glycemic Control Guidelines for People with Diabetes:Non Diabetic:  <6%Goal of Therapy: <7%Additional Action Suggested:  >8%     CBG: Recent Labs  Lab 05/03/22 2008 05/03/22 2346 05/04/22 0027 05/04/22 0036 05/04/22 0401  GLUCAP 77 59* 65* 77 81          CRITICAL CARE Performed by: Kennieth Rad   Total critical care time: 33 minutes  Critical care time was exclusive of separately billable procedures and treating other patients.  Critical care was necessary to treat or prevent imminent or life-threatening deterioration.  Critical care was time spent personally by me on the following activities: development of treatment plan with patient and/or surrogate as well as nursing, discussions with consultants, evaluation of patient's response to treatment, examination of patient, obtaining history from patient or surrogate, ordering and performing treatments and interventions, ordering and review of laboratory studies, ordering and review of radiographic studies, pulse oximetry and re-evaluation of patient's condition.     Kennieth Rad, MSN, AG-ACNP-BC [facility] 05/04/2022, 7:37 AM  See Amion for pager If no  response to pager, please call PCCM consult pager After 7:00 pm call Elink

## 2022-05-04 NOTE — Progress Notes (Signed)
Subjective: No complaints today.   Objective: Vital signs in last 24 hours: Temp:  [97.6 F (36.4 C)-98.4 F (36.9 C)] 97.7 F (36.5 C) (08/17 0831) Pulse Rate:  [73-108] 84 (08/17 0915) Resp:  [8-20] 17 (08/17 0915) BP: (88-132)/(44-79) 128/75 (08/17 0915) SpO2:  [65 %-100 %] 100 % (08/17 0915) Last BM Date : 05/04/22  Intake/Output from previous day: 08/16 0701 - 08/17 0700 In: 554 [I.V.:200.4; IV Piggyback:353.6] Out: 2426 [Urine:1605] Intake/Output this shift: Total I/O In: 168.1 [P.O.:120; I.V.:15; IV Piggyback:33.1] Out: 165 [Urine:165]  PE: Skin: stage IV sacral wound s/p debridement today, see separate progress note  Lab Results:  Recent Labs    05/03/22 0507 05/04/22 0355  WBC 8.1 8.7  HGB 9.0* 9.1*  HCT 26.6* 27.7*  PLT 212 210   BMET Recent Labs    05/03/22 0507 05/04/22 0355  NA 140 140  K 3.7 3.8  CL 113* 113*  CO2 22 21*  GLUCOSE 65* 80  BUN 55* 51*  CREATININE 1.67* 1.72*  CALCIUM 7.8* 7.7*   PT/INR Recent Labs    05/01/22 1300  LABPROT 20.9*  INR 1.8*   CMP     Component Value Date/Time   NA 140 05/04/2022 0355   K 3.8 05/04/2022 0355   CL 113 (H) 05/04/2022 0355   CO2 21 (L) 05/04/2022 0355   GLUCOSE 80 05/04/2022 0355   BUN 51 (H) 05/04/2022 0355   CREATININE 1.72 (H) 05/04/2022 0355   CALCIUM 7.7 (L) 05/04/2022 0355   PROT 5.0 (L) 05/01/2022 1244   ALBUMIN <1.5 (L) 05/01/2022 1244   AST 45 (H) 05/01/2022 1244   ALT 10 05/01/2022 1244   ALKPHOS 69 05/01/2022 1244   BILITOT 0.5 05/01/2022 1244   GFRNONAA 36 (L) 05/04/2022 0355   GFRAA  04/26/2009 0920    >60        The eGFR has been calculated using the MDRD equation. This calculation has not been validated in all clinical situations. eGFR's persistently <60 mL/min signify possible Chronic Kidney Disease.   Lipase  No results found for: "LIPASE"     Studies/Results: No results found.  Anti-infectives: Anti-infectives (From admission, onward)     Start     Dose/Rate Route Frequency Ordered Stop   05/03/22 1345  vancomycin (VANCOREADY) IVPB 1250 mg/250 mL        1,250 mg 166.7 mL/hr over 90 Minutes Intravenous Every 48 hours 05/01/22 1344     05/01/22 1345  vancomycin (VANCOREADY) IVPB 1500 mg/300 mL        1,500 mg 150 mL/hr over 120 Minutes Intravenous  Once 05/01/22 1344 05/01/22 1653   05/01/22 1300  cefTRIAXone (ROCEPHIN) 2 g in sodium chloride 0.9 % 100 mL IVPB        2 g 200 mL/hr over 30 Minutes Intravenous Every 24 hours 05/01/22 1245     05/01/22 1245  vancomycin (VANCOCIN) IVPB 1000 mg/200 mL premix  Status:  Discontinued        1,000 mg 200 mL/hr over 60 Minutes Intravenous  Once 05/01/22 1240 05/01/22 1344   05/01/22 1245  aztreonam (AZACTAM) 2 g in sodium chloride 0.9 % 100 mL IVPB  Status:  Discontinued        2 g 200 mL/hr over 30 Minutes Intravenous  Once 05/01/22 1240 05/01/22 1245        Assessment/Plan Stage IV sacral wound -bedside debridement done today -PT hydrotherapy to continue to clean wound -BID WD dressing  changes for now. -as long as no bleeding, may resume heparin or eliquis from our standpoint -no further surgical needs.  We will sign off.  I reviewed hospitalist notes, last 24 h vitals and pain scores, last 48 h intake and output, last 24 h labs and trends, and last 24 h imaging results.   LOS: 3 days    [name] , [facility] 05/04/2022, 10:47 AM Please see Amion for pager number during day hours 7:00am-4:30pm or 7:00am -11:30am on weekends

## 2022-05-04 NOTE — TOC Progression Note (Signed)
Transition of Care [facility]) - Progression Note    Patient Details  Name: Steven Ward MRN: [id number] Date of Birth: 10/14/1924  Transition of Care [facility]) CM/SW [place], [place] Phone Number: 05/04/2022, 3:56 PM  Clinical Narrative:     Patient from [facility] long term. Plan for patient to return when medically ready for dc. CSW will continue to follow and assist with patients dc planning needs.   Expected Discharge Plan: [place] Barriers to Discharge: Continued Medical Work up  Expected Discharge Plan and Services Expected Discharge Plan: [place] In-house Referral: Clinical Social Work     Living arrangements for the past 2 months: [place]                                       Social Determinants of Health (SDOH) Interventions    Readmission Risk Interventions    03/01/2022    2:59 PM  Readmission Risk Prevention Plan  Transportation Screening Complete  PCP or Specialist Appt within 3-5 Days Complete  HRI or [place] Complete  Social Work Consult for [place] Planning/Counseling Complete  Palliative Care Screening Not Applicable  Medication Review [employer]) Complete

## 2022-05-04 NOTE — Progress Notes (Signed)
Initial Nutrition Assessment  DOCUMENTATION CODES:   Non-severe (moderate) malnutrition in context of chronic illness  INTERVENTION:   Ensure Enlive po TID, each supplement provides 350 kcal and 20 grams of protein.  MVI with Minerals daily Vitamin C 500 mg BID, Zinc Sulfate 220 mg daily   Continue Dysphagia 3 diet, no dietary restrictions  NUTRITION DIAGNOSIS:   Moderate Malnutrition related to chronic illness as evidenced by mild fat depletion, mild muscle depletion, edema.   GOAL:   Patient will meet greater than or equal to 90% of their needs   MONITOR:   PO intake, Supplement acceptance, Skin, Weight trends  REASON FOR ASSESSMENT:   Rounds    ASSESSMENT:   88 yo male admitted with septic shock secondary to sacral decubitus, AKI on CKD 3b. PMH HTN, DM, pancreatic insufficiency, DM, anemia  8/14 Admitted 8/17 Surgical debridement  Pt sleeping on visit but arousable Stage IV sacral wound; Surgery performed debridement at bedside  Recorded po intake 20-25% of meals. Pt reports appetite is fair, reports he did not eat much at breakfast; recorded po 20%. RN reports pt drank 1 Ensure this AM. Pt reports at facility he drinks 3 Boost/Ensure per day. RD to order Ensure Enlive TID  Current wt 86 kg but pt with significant edema on exam. Unsure of dry weight at this time. Very limited NFPE secondary to generalized severe edema.   Given limited exam and unknown dry weight, pt meets characteristics for moderate malnutrition at this time but may very well be severely malnourished.   Noted hypoglycemic episodes this AM; DM coordinator following  Labs: CBGs 59-137 Meds: semglee   NUTRITION - FOCUSED PHYSICAL EXAM:  Flowsheet Row Most Recent Value  Orbital Region Mild depletion  Upper Arm Region Unable to assess  Thoracic and Lumbar Region Unable to assess  Buccal Region Mild depletion  Temple Region Mild depletion  Clavicle Bone Region Mild depletion  Clavicle  and Acromion Bone Region Mild depletion  Scapular Bone Region Mild depletion  Dorsal Hand Unable to assess  Patellar Region Unable to assess  Anterior Thigh Region Unable to assess  Posterior Calf Region Unable to assess  Edema (RD Assessment) Severe       Diet Order:   Diet Order             DIET DYS 3 Room service appropriate? Yes; Fluid consistency: Thin  Diet effective now                   EDUCATION NEEDS:   Education needs have been addressed  Skin:  Skin Assessment: Skin Integrity Issues: Skin Integrity Issues:: Stage IV  Last BM:  8/17  Height:   Ht Readings from Last 1 Encounters:  05/01/22 '5\' 8"'$  (1.727 m)    Weight:   Wt Readings from Last 1 Encounters:  05/03/22 86.1 kg     BMI:  Body mass index is 28.86 kg/m.  Estimated Nutritional Needs:   Kcal:  1700-1900 kcals  Protein:  105-125 g  Fluid:  >/= 1.7 L   Kerman Passey MS, RDN, LDN, CNSC Registered Dietitian 3 Clinical Nutrition RD Pager and On-Call Pager Number Located in [place]

## 2022-05-04 NOTE — Progress Notes (Signed)
Speech Language Pathology Treatment: Dysphagia  Patient Details Name: Steven Ward MRN: [id number] DOB: [birth year] Today's Date: 05/04/2022 Time: 1694-5038 SLP Time Calculation (min) (ACUTE ONLY): 13 min  Assessment / Plan / Recommendation Clinical Impression  Pt was seen for dysphagia treatment and he was cooperative throughout the session. Pt, and nursing reported that the pt has been tolerating the current diet without signs of aspiration. Pt tolerated dysphagia 3, and thin liquids via straw using consecutive swallows without symptoms of oropharyngeal dysphagia. Mastication of regular textures was prolonged with an under-ripe piece of pear being masticated for over 5 minutes without any significant change in size/texture. Pt ultimately removed this piece of pear and stated that he has been eating softer foods since he now rarely eats with his dentures. Pt expressed that he is currently disinterested in diet advancement since his current diet is representative of his baseline. It is recommended that the current diet of dysphagia 3 solids and thin liquids be continued. Further skilled SLP services are not clinically indicated at this time.    HPI HPI: Patient is a 88 y.o. male with PMH: CVA, Parkinson's disorder, colon cancer and atrial fibrilation who presented to the hospital from SNF with infected sacral decubitus ulcer. In ED he was found to be hypotensive and started on Levophed. Admitted to PCCM for septic shock secondary to sacral decubitus ulcer. He was recently admitted to hospital (6/7) following fall and MRI showing right cerebellar infarct. He was evaluated acutely and then by SLP in AIR where he was evaluated for swallow and cognition but was found to be functioning [facility] for both and therapeutic intervention by SLP not warranted.      SLP Plan  All goals met;Discharge SLP treatment due to (comment)      Recommendations for follow up therapy are one component of a multi-disciplinary  discharge planning process, led by the attending physician.  Recommendations may be updated based on patient status, additional functional criteria and insurance authorization.    Recommendations  Diet recommendations: Thin liquid;Dysphagia 3 (mechanical soft) Liquids provided via: Cup;Straw Medication Administration: Whole meds with liquid Supervision: Patient able to self feed Compensations: Slow rate;Small sips/bites Postural Changes and/or Swallow Maneuvers: Seated upright 90 degrees                Oral Care Recommendations: Oral care BID;Staff/trained caregiver to provide oral care Follow Up Recommendations: No SLP follow up Assistance recommended at discharge: None SLP Visit Diagnosis: Dysphagia, unspecified (R13.10) Plan: All goals met;Discharge SLP treatment due to (comment)          Steven Ward, [place], [place] Office number [phone]  Horton Marshall  05/04/2022, 11:07 AM

## 2022-05-04 NOTE — Inpatient Diabetes Management (Signed)
Inpatient Diabetes Program Recommendations  AACE/ADA: New Consensus Statement on Inpatient Glycemic Control (2015)  Target Ranges:  Prepandial:   less than 140 mg/dL      Peak postprandial:   less than 180 mg/dL (1-2 hours)      Critically ill patients:  140 - 180 mg/dL   Lab Results  Component Value Date   GLUCAP 101 (H) 05/04/2022   HGBA1C 7.9 (H) 02/24/2022    Latest Reference Range & Units 05/03/22 23:46 05/04/22 00:27 05/04/22 00:36 05/04/22 04:01 05/04/22 08:28  Glucose-Capillary 70 - 99 mg/dL 59 (L) 65 (L) 77 81 101 (H)  (L): Data is abnormally low (H): Data is abnormally high Review of Glycemic Control  Diabetes history: type 2 Outpatient Diabetes medications: Lantus 10 units daily Current orders for Inpatient glycemic control: Semglee 10 units daily  Inpatient Diabetes Program Recommendations:   Noted that patient has been having low blood sugars. Novolog correction scale had been discontinued at 12 MN on 8/16.  Recommend decreasing Semglee to 8 units daily if blood sugars continue to be less than 70 mg/dl.   Harvel Ricks RN BSN CDE Diabetes Coordinator Pager: [phone]  8am-5pm

## 2022-05-04 NOTE — Procedures (Signed)
Sacral Wound Debridment:  1.  Excisional vs non-excisional. Excisional  2.  Tool used for debridement (curette, scapel, etc.)  scalpel and scissors  3.  Frequency of surgical debridement.   once  4.  Measurement of total devitalized tissue (wound surface) before and after surgical debridement.  Before 5x6x3cm  After 8x6x3cm with 4cm tunneling at 2 oclock  5.  Area and depth of devitalized tissue removed from wound.  3x2x3cm debrided  6.  Blood loss and description of tissue removed.  No blood loss, all tissue removed is necrotic   7.  Evidence of the progress of the wound's response to treatment.  A.  Current wound volume (current dimensions and depth).  8x6x3cm  B.  Presence (and extent of) of infection.  No  C.  Presence (and extent of) of non viable tissue.  Yes, eschar and some partially liquified fibrinous tissue removed.  D.  Other material in the wound that is expected to inhibit healing.   Still with some thing necrotic fibrin at base of superior aspect of wound  8.  Was there any viable tissue removed (measurements): No   Will order Hydrotherapy to continue to clean the very base of the wound.  Over all this is much cleaner.  Before:   After:   Steven Ward 11:09 AM 05/04/2022

## 2022-05-05 DIAGNOSIS — N179 Acute kidney failure, unspecified: Secondary | ICD-10-CM | POA: Diagnosis not present

## 2022-05-05 DIAGNOSIS — E440 Moderate protein-calorie malnutrition: Secondary | ICD-10-CM | POA: Insufficient documentation

## 2022-05-05 DIAGNOSIS — L89159 Pressure ulcer of sacral region, unspecified stage: Secondary | ICD-10-CM | POA: Diagnosis not present

## 2022-05-05 DIAGNOSIS — R6521 Severe sepsis with septic shock: Secondary | ICD-10-CM | POA: Diagnosis not present

## 2022-05-05 DIAGNOSIS — A419 Sepsis, unspecified organism: Secondary | ICD-10-CM | POA: Diagnosis not present

## 2022-05-05 LAB — CBC
HCT: 24.9 % — ABNORMAL LOW (ref 39.0–52.0)
Hemoglobin: 8.2 g/dL — ABNORMAL LOW (ref 13.0–17.0)
MCH: 32.9 pg (ref 26.0–34.0)
MCHC: 32.9 g/dL (ref 30.0–36.0)
MCV: 100 fL (ref 80.0–100.0)
Platelets: 161 10*3/uL (ref 150–400)
RBC: 2.49 MIL/uL — ABNORMAL LOW (ref 4.22–5.81)
RDW: 15.9 % — ABNORMAL HIGH (ref 11.5–15.5)
WBC: 6.6 10*3/uL (ref 4.0–10.5)
nRBC: 0 % (ref 0.0–0.2)

## 2022-05-05 LAB — BASIC METABOLIC PANEL
Anion gap: 4 — ABNORMAL LOW (ref 5–15)
BUN: 46 mg/dL — ABNORMAL HIGH (ref 8–23)
CO2: 21 mmol/L — ABNORMAL LOW (ref 22–32)
Calcium: 7.6 mg/dL — ABNORMAL LOW (ref 8.9–10.3)
Chloride: 112 mmol/L — ABNORMAL HIGH (ref 98–111)
Creatinine, Ser: 1.65 mg/dL — ABNORMAL HIGH (ref 0.61–1.24)
GFR, Estimated: 38 mL/min — ABNORMAL LOW (ref >60)
Glucose, Bld: 166 mg/dL — ABNORMAL HIGH (ref 70–99)
Potassium: 4 mmol/L (ref 3.5–5.1)
Sodium: 137 mmol/L (ref 135–145)

## 2022-05-05 LAB — GLUCOSE, CAPILLARY
Glucose-Capillary: 132 mg/dL — ABNORMAL HIGH (ref 70–99)
Glucose-Capillary: 143 mg/dL — ABNORMAL HIGH (ref 70–99)
Glucose-Capillary: 155 mg/dL — ABNORMAL HIGH (ref 70–99)
Glucose-Capillary: 160 mg/dL — ABNORMAL HIGH (ref 70–99)
Glucose-Capillary: 163 mg/dL — ABNORMAL HIGH (ref 70–99)
Glucose-Capillary: 164 mg/dL — ABNORMAL HIGH (ref 70–99)
Glucose-Capillary: 90 mg/dL (ref 70–99)

## 2022-05-05 MED ORDER — INSULIN ASPART 100 UNIT/ML IJ SOLN
0.0000 [IU] | Freq: Three times a day (TID) | INTRAMUSCULAR | Status: DC
  Administered 2022-05-05: 2 [IU] via SUBCUTANEOUS
  Administered 2022-05-06 – 2022-05-08 (×4): 1 [IU] via SUBCUTANEOUS
  Administered 2022-05-09: 2 [IU] via SUBCUTANEOUS
  Administered 2022-05-09: 3 [IU] via SUBCUTANEOUS
  Administered 2022-05-10: 1 [IU] via SUBCUTANEOUS

## 2022-05-05 MED ORDER — INSULIN ASPART 100 UNIT/ML IJ SOLN: 0.0000 [IU] | Freq: Every day | INTRAMUSCULAR | Status: DC

## 2022-05-05 NOTE — Progress Notes (Signed)
Physical Therapy Wound Treatment Patient Details  Name: Steven Ward MRN: [id number] Date of Birth: July 07, 1925  Today's Date: 05/05/2022 Time: 9163-8466 Time Calculation (min): 31 min  Subjective  Subjective Assessment Patient and Family Stated Goals: pt did not state Date of Onset:  (unknown) Prior Treatments:  (unknown from SNF)  Pain Score:    Wound Assessment  Pressure Injury 02/25/22 Coccyx Unstageable - Full thickness tissue loss in which the base of the injury is covered by slough (yellow, tan, gray, green or brown) and/or eschar (tan, brown or black) in the wound bed. (Active)  Dressing Type Foam - Lift dressing to assess site every shift;ABD;Dakin's-soaked gauze 05/05/22 0250  Dressing Changed 05/04/22 2000  Dressing Change Frequency Daily 05/05/22 0250  State of Healing Eschar 05/04/22 2000  Site / Wound Assessment Purple;Red;Bleeding 05/04/22 2000  Peri-wound Assessment Erythema (non-blanchable) 05/04/22 2000  Wound Length (cm) 8 cm 05/03/22 0800  Wound Width (cm) 6 cm 05/03/22 0800  Wound Depth (cm) 2 cm 05/03/22 0800  Wound Surface Area (cm^2) 48 cm^2 05/03/22 0800  Wound Volume (cm^3) 96 cm^3 05/03/22 0800  Tunneling (cm) 4 05/02/22 0800  Drainage Amount Minimal 05/04/22 2000  Drainage Description Serosanguineous;Odor - sweet 05/04/22 2000  Treatment Cleansed;Off loading 05/04/22 2000  Wound Image   05/05/22 1339   Selective Debridement Selective Debridement - Location: Sacral Selective Debridement - Tools Used: Forceps, Scalpel, Scissors Selective Debridement - Tissue Removed: dark brown./purple necrotic tissue    Wound Assessment and Plan  Wound Therapy - Assess/Plan/Recommendations Wound Therapy - Clinical Statement: pt will benefit from cleansing and debridement of a large malodorous wound on his sacrum.  Will use Dakins for a few days to decrease bacterial load along with moderate selective debridement. Wound Therapy - Functional Problem List: limited  mobility with prolonged time in bed Factors Delaying/Impairing Wound Healing: Altered sensation, Infection - systemic/local, Immobility Hydrotherapy Plan: Debridement, Dressing change, Patient/family education Wound Therapy - Frequency: Other (comment) (Tues/Fridays) Wound Therapy - Current Recommendations: PT Wound Therapy - Follow Up Recommendations: dressing changes by RN  Wound Therapy Goals- Improve the function of patient's integumentary system by progressing the wound(s) through the phases of wound healing (inflammation - proliferation - remodeling) by: Wound Therapy Goals - Improve the function of patient's integumentary system by progressing the wound(s) through the phases of wound healing by: Decrease Necrotic Tissue to: 10 % Decrease Necrotic Tissue - Progress: Goal set today Increase Granulation Tissue to: 90% including healthy connective tissues Increase Granulation Tissue - Progress: Goal set today Improve Drainage Characteristics: Min, Serous Improve Drainage Characteristics - Progress: Goal set today Goals/treatment plan/discharge plan were made with and agreed upon by patient/family: Yes Time For Goal Achievement: 7 days Wound Therapy - Potential for Goals: Good  Goals will be updated until maximal potential achieved or discharge criteria met.  Discharge criteria: when goals achieved, discharge from hospital, MD decision/surgical intervention, no progress towards goals, refusal/missing three consecutive treatments without notification or medical reason.  GP     Charges PT Wound Care Charges $Wound Debridement up to 20 cm: < or equal to 20 cm $ Wound Debridement each add'l 20 sqcm: 2 $PT Hydrotherapy Visit: 1 Visit       [name] 05/05/2022, 1:54 PM 05/05/2022  Ginger Carne., PT Acute Rehabilitation Services [phone]  (pager) [phone]  (office)

## 2022-05-05 NOTE — Progress Notes (Signed)
NAME:  Steven Ward, MRN:  [id number], DOB:  April 29, 1925, LOS: 4 ADMISSION DATE:  05/01/2022, CONSULTATION DATE:  05/05/22 REFERRING MD:  EDP, CHIEF COMPLAINT:   sacral wound  History of Present Illness:  Steven Ward is a 88 y.o. M with PMH significant for R frontal lobe and cerebellar CVA in 02/2021, DM , parkinson's, colon Ca, Afib on xarelto who has been at rehab and then SNF since his stroke who presents today after staff noticed a large sacral decubitus ulcer and foul smelling drainage.  His son was not aware that this had progressed rapidly.  States that pt has been largely bed-bound secondary to weakness and is not successfully working with physical therapy.   Work-up in the ED revealed sacral subcutaneous air and fluid collection and lactic acid 3.4, Creatinine 2.1, Hgb 7.7.  His blood pressure was low despite IVF and antibiotics and PCCM was consulted for admission.   Pt denies pain or fever  Pertinent  Medical History   has a past medical history of ABNORMAL THYROID FUNCTION TESTS (01/17/2010), ALLERGIC RHINITIS (12/01/2008), Colon cancer ([place]), COLONIC POLYPS, HX OF (12/01/2008), DIABETES MELLITUS, TYPE II (12/01/2008), EDEMA LEG (05/04/2009), ESSENTIAL HYPERTENSION (12/01/2008), Exocrine pancreatic insufficiency, GALLSTONES (04/07/2009), GERD (12/01/2008), Hay fever, and SPONDYLOSIS, LUMBAR (12/01/2008).   Significant Hospital Events: Including procedures, antibiotic start and stop dates in addition to other pertinent events   8/14 admit to PCCM with septic shock from sacral decubitus ulcer, started on Levophed, vanc and cefepime 8/15 Pressor requirement coming down, no further bleeding, Hgb responded to transfusion  Interim History / Subjective:    Objective   Blood pressure (!) 97/54, pulse 73, temperature 97.6 F (36.4 C), temperature source Axillary, resp. rate 13, height '5\' 8"'$  (1.727 m), weight 86.1 kg, SpO2 100 %.        Intake/Output Summary (Last 24 hours) at 05/05/2022  0759 Last data filed at 05/05/2022 0500 Gross per 24 hour  Intake 506.31 ml  Output 1560 ml  Net -1053.69 ml   Filed Weights   05/02/22 0600 05/03/22 0524 05/05/22 0405  Weight: 87.9 kg 86.1 kg 86.1 kg   General:  chronically ill elderly male lying in bed in NAD watching TV and picking at breakfast HEENT: MM pink/moist, edentulous, pupils 2/ reactive, anicteric   Neuro: Awake, oriented person, month/year, place, MAE- generalized weakness CV: irir, afib, rate 80-90, no murmur, dopplered pedal pulses PULM:  non labored, room air, CTA GI: soft, bs+, NT, foley pale amber Extremities: warm/dry, generalized edema worse in LE 3+, sacral dressing intact  Afebrile UOP 1.6L/ 24hrs Net -1.8L   Labs reviewed > K 3.8, bicarb 21, sCr 1.67> 1.72, BUN 55> 51, mag 2, WBC 8.7, H/H 9/ 26.6 > 9.1> 27.7   Resolved Hospital Problem list     Assessment & Plan:   Septic Shock secondary to Sacral Decubitus ulcer  With underlying gas and 7x3x2 fluid collection on CT BC ngtd , UC > 100k colonies of diptheroids Shock resolved, now off levo, does have midodrine - continue ceftriaxone/ vanc day 5/7 - surgery signed offf - wound care, BID dressing changes, hydrotherapy  Acute Renal Failure superimposed on CKD 3b Likely prerenal in setting of shock. Improving - trend renal indices, avoid nephrotoxins  Hypokalemia -goal K>4 and Mag > 2, replete as needed   Type 2 DM Hypocalcemia - SSI sensitive  Anemia Hgb slowly trending down.  Serosanguinous output from wound.  - continue holding eliquis - transfuse for hgb less than 7  Atrial Fibrillation Rate controlled.  -hold metoprolol in the setting of hypotension - hold eliquis given ongoing oozing  Hypothyroidism -continue synthroid  Best Practice (right click and "Reselect all SmartList Selections" daily)   Diet/type: dysphagia diet (see orders)- s/p SLP eval 8/16 DVT prophylaxis: SCD GI prophylaxis: N/A Lines: Central line PICC   Foley:  Yes, and it is still needed Code Status:  DNR Last date of multidisciplinary goals of care discussion [Discussed with son at the bedside, confirms DNR, pressors and ICU are ok]  Son updated yesterday via phone, will contact again today

## 2022-05-06 DIAGNOSIS — A419 Sepsis, unspecified organism: Secondary | ICD-10-CM | POA: Diagnosis not present

## 2022-05-06 DIAGNOSIS — R6521 Severe sepsis with septic shock: Secondary | ICD-10-CM | POA: Diagnosis not present

## 2022-05-06 LAB — BASIC METABOLIC PANEL
Anion gap: 3 — ABNORMAL LOW (ref 5–15)
BUN: 43 mg/dL — ABNORMAL HIGH (ref 8–23)
CO2: 24 mmol/L (ref 22–32)
Calcium: 7.7 mg/dL — ABNORMAL LOW (ref 8.9–10.3)
Chloride: 114 mmol/L — ABNORMAL HIGH (ref 98–111)
Creatinine, Ser: 1.62 mg/dL — ABNORMAL HIGH (ref 0.61–1.24)
GFR, Estimated: 39 mL/min — ABNORMAL LOW (ref >60)
Glucose, Bld: 168 mg/dL — ABNORMAL HIGH (ref 70–99)
Potassium: 4.5 mmol/L (ref 3.5–5.1)
Sodium: 141 mmol/L (ref 135–145)

## 2022-05-06 LAB — CBC
HCT: 23.9 % — ABNORMAL LOW (ref 39.0–52.0)
Hemoglobin: 7.9 g/dL — ABNORMAL LOW (ref 13.0–17.0)
MCH: 33.2 pg (ref 26.0–34.0)
MCHC: 33.1 g/dL (ref 30.0–36.0)
MCV: 100.4 fL — ABNORMAL HIGH (ref 80.0–100.0)
Platelets: 155 10*3/uL (ref 150–400)
RBC: 2.38 MIL/uL — ABNORMAL LOW (ref 4.22–5.81)
RDW: 15.9 % — ABNORMAL HIGH (ref 11.5–15.5)
WBC: 7.7 10*3/uL (ref 4.0–10.5)
nRBC: 0 % (ref 0.0–0.2)

## 2022-05-06 LAB — GLUCOSE, CAPILLARY
Glucose-Capillary: 140 mg/dL — ABNORMAL HIGH (ref 70–99)
Glucose-Capillary: 122 mg/dL — ABNORMAL HIGH (ref 70–99)
Glucose-Capillary: 119 mg/dL — ABNORMAL HIGH (ref 70–99)
Glucose-Capillary: 139 mg/dL — ABNORMAL HIGH (ref 70–99)

## 2022-05-06 LAB — CULTURE, BLOOD (ROUTINE X 2)
Culture: NO GROWTH
Culture: NO GROWTH

## 2022-05-06 LAB — MAGNESIUM: Magnesium: 1.8 mg/dL (ref 1.7–2.4)

## 2022-05-06 MED ORDER — APIXABAN 2.5 MG PO TABS
2.5000 mg | ORAL_TABLET | Freq: Two times a day (BID) | ORAL | Status: DC
  Administered 2022-05-06 – 2022-05-10 (×9): 2.5 mg via ORAL
  Filled 2022-05-06 (×9): qty 1

## 2022-05-06 NOTE — Progress Notes (Signed)
PROGRESS NOTE   Steven Ward  ZOX:[id number] DOB: Nov 28, 1924 DOA: 05/01/2022 PCP: [name], MD  Brief Narrative:  88 year old male paroxysmal A-fib CHA2DS2-VASc >4 previously not on anticoagulation-now on Eliquis HTN Parkinson's Lumbar spondylosis DM TY 2 CKD 3B Gallstones with pancreatic insufficiency and GERD history of remote colon cancer previous admissions for SBO 03/07/2022 Acute punctate infarct at that time in addition  Admitted by critical care with septic shock from infected sacral ulcer 05/01/2022 placed on pressors  Noted also AKI additionally 8/14-PICC line placed 8/15-transfused 8/17-sacral wound debridement to 8 x 6 x 3 cm with tunneling noted and hydrotherapy ordered by general surgery was not signed off 8/19-transferred to [facility]ist   Hospital-Problem based course  Septic shock on admission Shock physiology is resolved Start implementing usual home meds  Large sacral decubitus ulcer status postdebridement 8/17 Appreciate general surgery input-twice daily dressings as per orders with Verdene Lennert solution Patient does not feel the pain in the back-monitor this It is not clear on CT scan 8/14 if he had overt osteomyelitis-but this does not seem to be down to bone White count is stable-we will continue antibiotics and complete 7 days--blood cultures from admission were stable I will get ESR CRP if these are elevated this may be nonspecific indicator of osteo and I will image with an MRI for duration Hydrotherapy to follow-up with PT Continue zinc and micronutrients Permanent A-fib CHADVASC >4 Intermittently seen on monitors Resume Eliquis 2.5 twice daily as no further procedures His blood pressure is low normal and he is on midodrine so we have held his metoprolol at this time Should he develop RVR we may need to ask cardiology regarding amiodarone or digoxin HFpEF last EF 55-60% Prior to admission was on Aldactone which has been held and may need to  be held ongoing given low blood pressures Underlying Parkinson's disease Continue Sinemet 1 tab twice daily, and 1 tab in evening additionally Dysphagia He is awake coherent and does not have issues with dysphagia placed him on a regular diet Anemia of acute blood loss Likely from procedure and blood loss from diet Monitor trends transfusion threshold not met but received 1 unit on 8/15 DM TY 2 Sugars are reasonable in the 1 20-1 40 range Continuing sliding scale alone May be able to resume glipizide 5 daily in the outpatient setting CKD 3B He is at his baseline Chronic indwelling Foley Patient has been at facility for month and was placed on Foley-he will need outpatient urology follow-up Resume Flomax closer to discharge  DVT prophylaxis: Eliquis resumed Code Status: DNR Family Communication: Discussed with 1 son at the bedside Disposition:  Status is: Inpatient Remains inpatient appropriate because:   Still needs planning and may need to return to skilled facility Monday or Tuesday for stable   Consultants:  General surgery Critical care  Procedures:   Antimicrobials: Vanco and ceftriaxone   Subjective: Awake coherent pleasant no distress tells me does not like the food No pain  Objective: Vitals:   05/06/22 0545 05/06/22 0600 05/06/22 0615 05/06/22 0630  BP: 119/77 (!) 98/50 100/63 (!) 111/51  Pulse: 84 81 76 66  Resp: _0 Temp:      TempSrc:      SpO2: 100% 100% 100% 100%  Weight:      Height:        Intake/Output Summary (Last 24 hours) at 05/06/2022 0724 Last data filed at 05/06/2022 0000 Gross per 24 hour  Intake 350.05 ml  Output 995 ml  Net -644.95 ml   Filed Weights   05/03/22 0524 05/05/22 0405 05/06/22 0500  Weight: 86.1 kg 86.1 kg 85.2 kg    Examination:  EOMI NCAT no focal deficit S1-S2 no murmur A-fib on monitors Abdomen soft Sacral decubitus as below   Data Reviewed: personally reviewed   CBC    Component Value  Date/Time   WBC 7.7 05/06/2022 0037   RBC 2.38 (L) 05/06/2022 0037   HGB 7.9 (L) 05/06/2022 0037   HCT 23.9 (L) 05/06/2022 0037   PLT 155 05/06/2022 0037   MCV 100.4 (H) 05/06/2022 0037   MCH 33.2 05/06/2022 0037   MCHC 33.1 05/06/2022 0037   RDW 15.9 (H) 05/06/2022 0037   LYMPHSABS 1.2 05/01/2022 1244   MONOABS 0.6 05/01/2022 1244   EOSABS 0.0 05/01/2022 1244   BASOSABS 0.0 05/01/2022 1244      Latest Ref Rng & Units 05/06/2022   12:37 AM 05/05/2022    3:34 AM 05/04/2022    3:55 AM  CMP  Glucose 70 - 99 mg/dL 168  166  80   BUN 8 - 23 mg/dL 43  46  51   Creatinine 0.61 - 1.24 mg/dL 1.62  1.65  1.72   Sodium 135 - 145 mmol/L 141  137  140   Potassium 3.5 - 5.1 mmol/L 4.5  4.0  3.8   Chloride 98 - 111 mmol/L 114  112  113   CO2 22 - 32 mmol/L _0 Calcium 8.9 - 10.3 mg/dL 7.7  7.6  7.7      Radiology Studies: No results found.   Scheduled Meds:  sodium chloride   Intravenous Once   sodium chloride   Intravenous Once   ascorbic acid  500 mg Oral BID   Carbidopa-Levodopa ER  1 tablet Oral BID WC   Carbidopa-Levodopa ER  1 tablet Oral QPM   Chlorhexidine Gluconate Cloth  6 each Topical Daily   feeding supplement  237 mL Oral TID BM   insulin aspart  0-5 Units Subcutaneous QHS   insulin aspart  0-9 Units Subcutaneous TID WC   levothyroxine  25 mcg Oral Daily   midodrine  10 mg Oral TID WC   multivitamin with minerals  1 tablet Oral Daily   mupirocin ointment  1 Application Nasal BID   sodium chloride flush  10-40 mL Intracatheter Q12H   sodium hypochlorite   Irrigation Daily   zinc sulfate  220 mg Oral Daily   Continuous Infusions:  sodium chloride     cefTRIAXone (ROCEPHIN)  IV Stopped (05/05/22 1208)   vancomycin Stopped (05/05/22 1542)     LOS: 5 days   Time spent: [place], MD [facility] To contact the attending provider between 7A-7P or the covering provider during after hours 7P-7A, please log into the web site  [email or web address] and access using universal [place] password for that web site. If you do not have the password, please call the hospital operator.  05/06/2022, 7:24 AM

## 2022-05-06 NOTE — Progress Notes (Signed)
Sacral decubitus ulcer wound is cleaned and put new dressing with Dakin's moisten kerlix packed and covered with ABD pad and taped. Patient tolerated procedure well.

## 2022-05-07 ENCOUNTER — Other Ambulatory Visit: Payer: Self-pay | Admitting: Family Medicine

## 2022-05-07 DIAGNOSIS — A419 Sepsis, unspecified organism: Secondary | ICD-10-CM | POA: Diagnosis not present

## 2022-05-07 DIAGNOSIS — R6521 Severe sepsis with septic shock: Secondary | ICD-10-CM | POA: Diagnosis not present

## 2022-05-07 DIAGNOSIS — E039 Hypothyroidism, unspecified: Secondary | ICD-10-CM

## 2022-05-07 LAB — COMPREHENSIVE METABOLIC PANEL
ALT: 5 U/L (ref 0–44)
AST: 46 U/L — ABNORMAL HIGH (ref 15–41)
Albumin: <1.5 g/dL — ABNORMAL LOW (ref 3.5–5.0)
Alkaline Phosphatase: 71 U/L (ref 38–126)
Anion gap: 6 (ref 5–15)
BUN: 41 mg/dL — ABNORMAL HIGH (ref 8–23)
CO2: 23 mmol/L (ref 22–32)
Calcium: 7.8 mg/dL — ABNORMAL LOW (ref 8.9–10.3)
Chloride: 112 mmol/L — ABNORMAL HIGH (ref 98–111)
Creatinine, Ser: 1.7 mg/dL — ABNORMAL HIGH (ref 0.61–1.24)
GFR, Estimated: 37 mL/min — ABNORMAL LOW (ref >60)
Glucose, Bld: 131 mg/dL — ABNORMAL HIGH (ref 70–99)
Potassium: 4.1 mmol/L (ref 3.5–5.1)
Sodium: 141 mmol/L (ref 135–145)
Total Bilirubin: 0.6 mg/dL (ref 0.3–1.2)
Total Protein: 4.4 g/dL — ABNORMAL LOW (ref 6.5–8.1)

## 2022-05-07 LAB — CBC WITH DIFFERENTIAL/PLATELET
Abs Immature Granulocytes: 0.05 10*3/uL (ref 0.00–0.07)
Basophils Absolute: 0 10*3/uL (ref 0.0–0.1)
Basophils Relative: 0 %
Eosinophils Absolute: 0.2 10*3/uL (ref 0.0–0.5)
Eosinophils Relative: 2 %
HCT: 23.3 % — ABNORMAL LOW (ref 39.0–52.0)
Hemoglobin: 7.8 g/dL — ABNORMAL LOW (ref 13.0–17.0)
Immature Granulocytes: 1 %
Lymphocytes Relative: 32 %
Lymphs Abs: 2.5 10*3/uL (ref 0.7–4.0)
MCH: 33.8 pg (ref 26.0–34.0)
MCHC: 33.5 g/dL (ref 30.0–36.0)
MCV: 100.9 fL — ABNORMAL HIGH (ref 80.0–100.0)
Monocytes Absolute: 0.7 10*3/uL (ref 0.1–1.0)
Monocytes Relative: 9 %
Neutro Abs: 4.3 10*3/uL (ref 1.7–7.7)
Neutrophils Relative %: 56 %
Platelets: 165 10*3/uL (ref 150–400)
RBC: 2.31 MIL/uL — ABNORMAL LOW (ref 4.22–5.81)
RDW: 15.9 % — ABNORMAL HIGH (ref 11.5–15.5)
WBC: 7.8 10*3/uL (ref 4.0–10.5)
nRBC: 0 % (ref 0.0–0.2)

## 2022-05-07 LAB — GLUCOSE, CAPILLARY
Glucose-Capillary: 126 mg/dL — ABNORMAL HIGH (ref 70–99)
Glucose-Capillary: 115 mg/dL — ABNORMAL HIGH (ref 70–99)
Glucose-Capillary: 114 mg/dL — ABNORMAL HIGH (ref 70–99)
Glucose-Capillary: 179 mg/dL — ABNORMAL HIGH (ref 70–99)
Glucose-Capillary: 195 mg/dL — ABNORMAL HIGH (ref 70–99)

## 2022-05-07 LAB — MAGNESIUM: Magnesium: 1.8 mg/dL (ref 1.7–2.4)

## 2022-05-07 LAB — SEDIMENTATION RATE: Sed Rate: 35 mm/hr — ABNORMAL HIGH (ref 0–16)

## 2022-05-07 LAB — C-REACTIVE PROTEIN: CRP: 3.6 mg/dL — ABNORMAL HIGH (ref <1.0)

## 2022-05-07 MED ORDER — ORAL CARE MOUTH RINSE: 15.0000 mL | OROMUCOSAL | Status: DC | PRN

## 2022-05-07 MED ORDER — METOPROLOL SUCCINATE ER 25 MG PO TB24
12.5000 mg | ORAL_TABLET | Freq: Every day | ORAL | Status: DC
Start: 2022-05-07 — End: 2022-05-10
  Administered 2022-05-07 – 2022-05-10 (×4): 12.5 mg via ORAL
  Filled 2022-05-07 (×5): qty 1

## 2022-05-07 NOTE — Progress Notes (Signed)
PROGRESS NOTE   Steven Ward  FHN:[id number] DOB: 27-May-1925 DOA: 05/01/2022 PCP: Kristian Covey, MD  Brief Narrative:  88 year old male paroxysmal A-fib CHA2DS2-VASc >4 previously not on anticoagulation-now on Eliquis HTN Parkinson's Lumbar spondylosis DM TY 2 CKD 3B Gallstones with pancreatic insufficiency and GERD history of remote colon cancer previous admissions for SBO 03/07/2022 Acute punctate infarct at that time in addition  Admitted by critical care with septic shock from infected sacral ulcer 05/01/2022 placed on pressors  Noted also AKI additionally 8/14-PICC line placed 8/15-transfused 8/17-sacral wound debridement to 8 x 6 x 3 cm with tunneling noted and hydrotherapy ordered by general surgery was not signed off 8/19-transferred to [facility] hospitalist 8/20 bone scan ordered but pending   Hospital-Problem based course  Septic shock on admission Shock physiology is resolved--on midodrine now because of borderline low normal blood pressures at 10 3 times daily may need to discharge on this Start implementing usual home meds  Large sacral decubitus ulcer status postdebridement 8/17 Appreciate general surgery input-twice daily dressings as per orders with Lisette Grinder solution Patient does not feel the pain in the back-monitor this ESR CRP elevated-get bone scan to rule out osteo and determine duration of antibiotics may need input from general surgery Continues vancomycin and ceftriaxone at this time, pain control Oxy IR 5 every 6 as needed severe pain Hydrotherapy to follow-up with PT Continue zinc and micronutrients Permanent A-fib CHADVASC >4 Episodic V. tach 7 beats Resume Eliquis 2.5 twice daily as no further procedures Resume Toprol XL but at lower dose 12.5 given some hypotension Obtain magnesium in a.m. HFpEF last EF 55-60% Aldactone held from prior to admission Underlying Parkinson's disease Continue Sinemet 1 tab twice daily, and 1 tab in evening  additionally Dysphagia He is awake coherent and does not have issues with dysphagia placed him on a regular diet Anemia of acute blood loss Likely from procedure and blood loss from diet Monitor trends transfusion threshold not met but received 1 unit on 8/15 DM TY 2 Sugars ranging 1 20-1 40 Continuing sliding scale alone May be able to resume glipizide 5 daily in the outpatient setting CKD 3B He is at his baseline Chronic indwelling Foley Patient has been at facility for month and was placed on Foley-he will need outpatient urology follow-up Resume Flomax closer to discharge  DVT prophylaxis: Eliquis resumed 8/19 Code Status: DNR Family Communication: No family present at this time Disposition:  Status is: Inpatient Remains inpatient appropriate because:   Awaiting bone scan to determine duration of treatment Likely can discharge in 2 to 3 days   Consultants:  General surgery Critical care  Procedures:   Antimicrobials: Vanco and ceftriaxone   Subjective:  No new complaints no issues no pain No cough cold or fever   Objective: Vitals:   05/06/22 2350 05/07/22 0308 05/07/22 0500 05/07/22 0731  BP: (!) 97/47 (!) 98/54  (!) 106/54  Pulse: 81 78  69  Resp: 20 18  16   Temp: 98.1 F (36.7 C) 98.3 F (36.8 C)  98.9 F (37.2 C)  TempSrc: Oral Oral  Oral  SpO2: 96% 99%  100%  Weight:   86.6 kg   Height:        Intake/Output Summary (Last 24 hours) at 05/07/2022 0859 Last data filed at 05/07/2022 0735 Gross per 24 hour  Intake --  Output 1125 ml  Net -1125 ml    Filed Weights   05/05/22 0405 05/06/22 0500 05/07/22 0500  Weight: 86.1 kg 85.2  kg 86.6 kg    Examination:  Coherent pleasant no distress EOMI NCAT no focal deficit K2-I0 holosystolic murmur Abdomen is soft no rebound Wound not examined today ROM grossly intact Chest is clear   Data Reviewed: personally reviewed   CBC    Component Value Date/Time   WBC 7.8 05/07/2022 0500   RBC 2.31  (L) 05/07/2022 0500   HGB 7.8 (L) 05/07/2022 0500   HCT 23.3 (L) 05/07/2022 0500   PLT 165 05/07/2022 0500   MCV 100.9 (H) 05/07/2022 0500   MCH 33.8 05/07/2022 0500   MCHC 33.5 05/07/2022 0500   RDW 15.9 (H) 05/07/2022 0500   LYMPHSABS 2.5 05/07/2022 0500   MONOABS 0.7 05/07/2022 0500   EOSABS 0.2 05/07/2022 0500   BASOSABS 0.0 05/07/2022 0500      Latest Ref Rng & Units 05/07/2022    5:00 AM 05/06/2022   12:37 AM 05/05/2022    3:34 AM  CMP  Glucose 70 - 99 mg/dL 131  168  166   BUN 8 - 23 mg/dL 41  43  46   Creatinine 0.61 - 1.24 mg/dL 1.70  1.62  1.65   Sodium 135 - 145 mmol/L 141  141  137   Potassium 3.5 - 5.1 mmol/L 4.1  4.5  4.0   Chloride 98 - 111 mmol/L 112  114  112   CO2 22 - 32 mmol/L $RemoveB'23  24  21   '[id number]$ Calcium 8.9 - 10.3 mg/dL 7.8  7.7  7.6   Total Protein 6.5 - 8.1 g/dL 4.4     Total Bilirubin 0.3 - 1.2 mg/dL 0.6     Alkaline Phos 38 - 126 U/L 71     AST 15 - 41 U/L 46     ALT 0 - 44 U/L 5        Radiology Studies: No results found.   Scheduled Meds:  sodium chloride   Intravenous Once   sodium chloride   Intravenous Once   apixaban  2.5 mg Oral BID   ascorbic acid  500 mg Oral BID   Carbidopa-Levodopa ER  1 tablet Oral BID WC   Carbidopa-Levodopa ER  1 tablet Oral QPM   Chlorhexidine Gluconate Cloth  6 each Topical Daily   feeding supplement  237 mL Oral TID BM   insulin aspart  0-5 Units Subcutaneous QHS   insulin aspart  0-9 Units Subcutaneous TID WC   levothyroxine  25 mcg Oral Daily   midodrine  10 mg Oral TID WC   multivitamin with minerals  1 tablet Oral Daily   sodium chloride flush  10-40 mL Intracatheter Q12H   sodium hypochlorite   Irrigation Daily   zinc sulfate  220 mg Oral Daily   Continuous Infusions:  sodium chloride     cefTRIAXone (ROCEPHIN)  IV 2 g (05/06/22 1009)   vancomycin Stopped (05/05/22 1542)     LOS: 6 days   Time spent: [place], MD [facility] To contact the attending provider between 7A-7P  or the covering provider during after hours 7P-7A, please log into the web site [email or web address] and access using universal [place] password for that web site. If you do not have the password, please call the hospital operator.  05/07/2022, 8:59 AM

## 2022-05-08 ENCOUNTER — Inpatient Hospital Stay (HOSPITAL_COMMUNITY): Payer: PPO

## 2022-05-08 DIAGNOSIS — R6521 Severe sepsis with septic shock: Secondary | ICD-10-CM | POA: Diagnosis not present

## 2022-05-08 DIAGNOSIS — A419 Sepsis, unspecified organism: Secondary | ICD-10-CM | POA: Diagnosis not present

## 2022-05-08 LAB — GLUCOSE, CAPILLARY
Glucose-Capillary: 147 mg/dL — ABNORMAL HIGH (ref 70–99)
Glucose-Capillary: 143 mg/dL — ABNORMAL HIGH (ref 70–99)
Glucose-Capillary: 54 mg/dL — ABNORMAL LOW (ref 70–99)
Glucose-Capillary: 81 mg/dL (ref 70–99)
Glucose-Capillary: 83 mg/dL (ref 70–99)

## 2022-05-08 LAB — CBC WITH DIFFERENTIAL/PLATELET
Abs Immature Granulocytes: 0.06 10*3/uL (ref 0.00–0.07)
Basophils Absolute: 0 10*3/uL (ref 0.0–0.1)
Basophils Relative: 0 %
Eosinophils Absolute: 0.2 10*3/uL (ref 0.0–0.5)
Eosinophils Relative: 2 %
HCT: 24 % — ABNORMAL LOW (ref 39.0–52.0)
Hemoglobin: 7.7 g/dL — ABNORMAL LOW (ref 13.0–17.0)
Immature Granulocytes: 1 %
Lymphocytes Relative: 34 %
Lymphs Abs: 2.9 10*3/uL (ref 0.7–4.0)
MCH: 32.5 pg (ref 26.0–34.0)
MCHC: 32.1 g/dL (ref 30.0–36.0)
MCV: 101.3 fL — ABNORMAL HIGH (ref 80.0–100.0)
Monocytes Absolute: 0.7 10*3/uL (ref 0.1–1.0)
Monocytes Relative: 8 %
Neutro Abs: 4.8 10*3/uL (ref 1.7–7.7)
Neutrophils Relative %: 55 %
Platelets: 172 10*3/uL (ref 150–400)
RBC: 2.37 MIL/uL — ABNORMAL LOW (ref 4.22–5.81)
RDW: 15.8 % — ABNORMAL HIGH (ref 11.5–15.5)
WBC: 8.7 10*3/uL (ref 4.0–10.5)
nRBC: 0 % (ref 0.0–0.2)

## 2022-05-08 LAB — BASIC METABOLIC PANEL
Anion gap: 6 (ref 5–15)
BUN: 41 mg/dL — ABNORMAL HIGH (ref 8–23)
CO2: 23 mmol/L (ref 22–32)
Calcium: 7.5 mg/dL — ABNORMAL LOW (ref 8.9–10.3)
Chloride: 112 mmol/L — ABNORMAL HIGH (ref 98–111)
Creatinine, Ser: 1.67 mg/dL — ABNORMAL HIGH (ref 0.61–1.24)
GFR, Estimated: 37 mL/min — ABNORMAL LOW (ref >60)
Glucose, Bld: 152 mg/dL — ABNORMAL HIGH (ref 70–99)
Potassium: 4.2 mmol/L (ref 3.5–5.1)
Sodium: 141 mmol/L (ref 135–145)

## 2022-05-08 LAB — MAGNESIUM: Magnesium: 1.9 mg/dL (ref 1.7–2.4)

## 2022-05-08 MED ORDER — GLUCOSE 40 % PO GEL
2.0000 | ORAL | Status: AC
  Administered 2022-05-08: 62 g via ORAL
  Filled 2022-05-08: qty 2.42

## 2022-05-08 MED ORDER — TECHNETIUM TC 99M MEDRONATE IV KIT
20.0000 | PACK | Freq: Once | INTRAVENOUS | Status: AC | PRN
  Administered 2022-05-08: 20 via INTRAVENOUS

## 2022-05-08 MED ORDER — SODIUM CHLORIDE 0.9 % IV SOLN
2.0000 g | INTRAVENOUS | Status: DC
  Administered 2022-05-08: 2 g via INTRAVENOUS
  Filled 2022-05-08: qty 20

## 2022-05-08 MED ORDER — DEXTROSE 10 % IV SOLN: INTRAVENOUS | Status: DC

## 2022-05-08 MED ORDER — VANCOMYCIN HCL 1250 MG/250ML IV SOLN
1250.0000 mg | INTRAVENOUS | Status: DC
Start: 2022-05-09 — End: 2022-05-08

## 2022-05-08 MED ORDER — LEVOFLOXACIN 500 MG PO TABS
500.0000 mg | ORAL_TABLET | Freq: Every evening | ORAL | Status: DC
Start: 2022-05-08 — End: 2022-05-09
  Administered 2022-05-08: 500 mg via ORAL
  Filled 2022-05-08: qty 1

## 2022-05-08 MED ORDER — DOXYCYCLINE HYCLATE 100 MG PO TABS
100.0000 mg | ORAL_TABLET | Freq: Two times a day (BID) | ORAL | Status: DC
Start: 2022-05-08 — End: 2022-05-10
  Administered 2022-05-08 – 2022-05-10 (×4): 100 mg via ORAL
  Filled 2022-05-08 (×4): qty 1

## 2022-05-08 MED ORDER — VANCOMYCIN HCL 1250 MG/250ML IV SOLN: 1250.0000 mg | INTRAVENOUS | Status: DC

## 2022-05-08 MED ORDER — VANCOMYCIN HCL 1250 MG/250ML IV SOLN
1250.0000 mg | INTRAVENOUS | Status: DC
  Filled 2022-05-08: qty 250

## 2022-05-08 MED ORDER — AMOXICILLIN-POT CLAVULANATE 875-125 MG PO TABS: 1.0000 | ORAL_TABLET | Freq: Two times a day (BID) | ORAL | Status: DC

## 2022-05-08 MED ORDER — METRONIDAZOLE 500 MG PO TABS
500.0000 mg | ORAL_TABLET | Freq: Two times a day (BID) | ORAL | Status: DC
Start: 2022-05-08 — End: 2022-05-10
  Administered 2022-05-08 – 2022-05-10 (×4): 500 mg via ORAL
  Filled 2022-05-08 (×4): qty 1

## 2022-05-08 NOTE — Care Management Important Message (Signed)
Important Message  Patient Details  Name: Steven Ward MRN: [id number] Date of Birth: [birth year]   Medicare Important Message Given:  Yes     [name] 05/08/2022, 3:03 PM

## 2022-05-08 NOTE — TOC Progression Note (Signed)
Transition of Care [facility]) - Progression Note    Patient Details  Name: Steven Ward MRN: [id number] Date of Birth: Jul 24, 1925  Transition of Care [facility]) CM/SW Contact  Pollie Friar, RN Phone Number: 05/08/2022, 4:37 PM  Clinical Narrative:    CM spoke with patients son and he is interested in pt going to another SNF for potential rehab. CM went over that he will need to qualify with therapy and have HTA approve rehab. Pt would also have to participate at the rehab to keep getting approved for the 20 days HTA would cover before co pays. Son wants to try.  CM also e-mailed him the link for medicare.gov so he see other facilities that may be closer to them for LTC. TOC following.   Expected Discharge Plan: [place] Barriers to Discharge: Continued Medical Work up  Expected Discharge Plan and Services Expected Discharge Plan: [place] In-house Referral: Clinical Social Work     Living arrangements for the past 2 months: [place]                                       Social Determinants of Health (SDOH) Interventions    Readmission Risk Interventions    03/01/2022    2:59 PM  Readmission Risk Prevention Plan  Transportation Screening Complete  PCP or Specialist Appt within 3-5 Days Complete  HRI or [place] Complete  Social Work Consult for [place] Planning/Counseling Complete  Palliative Care Screening Not Applicable  Medication Review [employer]) Complete

## 2022-05-08 NOTE — Progress Notes (Signed)
   05/08/22 1436  Provider Notification  Provider Name/Title Dr. Verlon Au Attending  Date Provider Notified 05/08/22  Time Provider Notified 1435  Method of Notification Page  Notification Reason Other (Comment) (Nuclear Medicine scan refusal)   Page sent to Dr. Verlon Au: 3W 24- [place] - Refusing to go back down to nuclear medicine for third phase of bone scan. Please call, urgent d/t dose. Thanks Ivy RN  Waunita Schooner in Nuclear Medicine called to state that when patient was in Nuclear Medicine, patient stated that he was refusing to go back to Nuclear Medicine for remainder of scan.  Paged Dr. Verlon Au as dose is time sensitive. Dr. Verlon Au unable to convince patient to go to Nuclear Medicine. Patient stated he would not go back, asked if I could call his son, he declined.  I called Waunita Schooner back in  Nuclear Medicine to notify that patient would not be back, he stated he would send over what was obtained earlier for radiologist to interpret.

## 2022-05-08 NOTE — Consult Note (Addendum)
[place] Nurse wound follow up Patient receiving care in [facility] [id number] Per request of Dr. Verlon Au to re-assess bilateral heels.  Wound type: Stage 2 right heel Stage 3 left heel Measurement: Both measuring 3 x 3 Wound bed: Right heel has a loose layer of skin with a break in skin at 7 o'clock with scant serous drainage.  Left heel is open with pink and yellow and moderate sanguinous drainage.  Bilateral feet are edematous Periwound: intact Dressing procedure/placement/frequency: Clean both feet with soap and water, rinse and pat dry. Apply a cut to fit piece of Aquacel Advantage (lawson # [id number]) over the heels and secure with foam dressing. Change Aquacel daily.  [place] nurse is following for sacral wound and will see with PT on Friday with Hydrotherapy. Please re-consult the [address] team if needed.  Cathlean Marseilles Tamala Julian, MSN, RN, [place], [place]le Pearl, [facility] Wound Treatment Associate Pager [phone]

## 2022-05-08 NOTE — Progress Notes (Signed)
Hypoglycemic Event  CBG: 54  Treatment: 2 tubes glucose gel at 1902   Symptoms: None  Follow-up CBG: Time: 1930 CBG Result: 81  Possible Reasons for Event: Inadequate meal intake  Comments/MD notified: Dr. Bridgett Larsson, overnight hospitalist coverage paged.   3W [address] [place] -- hypoglycemic event at 1800, CBG 54. gave glucose gel, CBG on recheck at 1930 is 81. Thanks

## 2022-05-08 NOTE — Consult Note (Signed)
[facility] Nurse wound follow up Attempted to see patient for follow-up on heel wounds, per Dr. Verlon Au request. Patient not in room. Will check back later.  Cathlean Marseilles Tamala Julian, MSN, RN, [place], Lysle Pearl, [facility] Wound Treatment Associate Pager [phone]

## 2022-05-08 NOTE — Progress Notes (Signed)
Pharmacy Antibiotic Note- follow-up  Steven Ward is a 88 y.o. male admitted on 05/01/2022 with sepsis 2/2 decubitus ulcer.  Pharmacy has been consulted for vancomycin dosing. Also on CTX. Planning bedside debridement.  Plan: Continue vancomycin '1250mg'$  IV q48h Continue ceftriaxone 2g IV q24h Follow Cr, Cx, LOT  Vancomycin levels w/ 8/22 dose  Height: '5\' 8"'$  (172.7 cm) Weight: 86.9 kg (191 lb 9.3 oz) IBW/kg (Calculated) : 68.4  Temp (24hrs), Avg:98.2 F (36.8 C), Min:97.9 F (36.6 C), Max:98.6 F (37 C)  Recent Labs  Lab 05/01/22 1244 05/01/22 1700 05/02/22 0448 05/04/22 0355 05/05/22 0334 05/06/22 0037 05/07/22 0500 05/08/22 0410  WBC 7.1  --    < > 8.7 6.6 7.7 7.8 8.7  CREATININE 2.10*  --    < > 1.72* 1.65* 1.62* 1.70* 1.67*  LATICACIDVEN 3.4* 2.6*  --   --   --   --   --   --    < > = values in this interval not displayed.     Estimated Creatinine Clearance: 28.4 mL/min (A) (by C-G formula based on SCr of 1.67 mg/dL (H)).    Allergies  Allergen Reactions   Amoxicillin     REACTION: rash, dizziness    Thank you for allowing pharmacy to be a part of this patient's care.  Vaughan Basta BS, PharmD, BCPS Clinical Pharmacist 05/08/2022 8:44 AM  Contact: [phone] after 3 PM  "Be curious, not judgmental..." -Jamal Maes

## 2022-05-08 NOTE — Progress Notes (Signed)
PROGRESS NOTE   BRISTOL SOY  GOV:[id number] DOB: 1925/08/29 DOA: 05/01/2022 PCP: [name], MD  Brief Narrative:  88 year old male paroxysmal A-fib CHA2DS2-VASc >4 previously not on anticoagulation-now on Eliquis HTN Parkinson's Lumbar spondylosis DM TY 2 CKD 3B Gallstones with pancreatic insufficiency and GERD history of remote colon cancer previous admissions for SBO 03/07/2022 Acute punctate infarct at that time in addition  Admitted by critical care with septic shock from infected sacral ulcer 05/01/2022 placed on pressors  Noted also AKI additionally 8/14-PICC line placed 8/15-transfused 8/17-sacral wound debridement to 8 x 6 x 3 cm with tunneling noted and hydrotherapy ordered by general surgery was not signed off 8/19-transferred to [facility] hospitalist 8/20 bone scan ordered but pending   Hospital-Problem based course  Septic shock on admission Shock physiology is resolved--on midodrine now because of borderline low normal blood pressures at 10 3 times daily may need to discharge on this Start implementing usual home meds  Large sacral decubitus ulcer status postdebridement 8/17 Appreciate general surgery input-twice daily dressings as per orders with Verdene Lennert solution--Hydrotherapy to follow-up with patient re: further planning Patient does not feel the pain in the back-monitor this ESR CRP elevated-bone scan ordered 8/20 still pending Continues vancomycin and ceftriaxone at this time, pain control Oxy IR 5 every 6 as needed severe pain Continue zinc and micronutrients Calcaneal decubiti bilaterally Float heels turn as able and ask wound care to see Permanent A-fib CHADVASC >4 Episodic V. tach 7 beats Resume Eliquis 2.5 twice daily as no further procedures Resume Toprol XL but at lower dose 12.5 given some hypotension Magnesium close to normal range HFpEF last EF 55-60% Aldactone held from prior to admission, Lasix 3 times a week held from admission Underlying  Parkinson's disease Continue Sinemet 1 tab twice daily, and 1 tab in evening additionally Dysphagia He is awake coherent and does not have issues with dysphagia placed him on a regular diet Anemia of acute blood loss Likely from procedure and blood loss--- ranges 7-8 received 1 unit on 8/15 DM TY 2 Sugars ranging less than 200 Continuing sliding scale alone May be able to resume glipizide 5 daily in the outpatient setting CKD 3B He is at his baseline Chronic indwelling Foley Patient has been at facility for month and was placed on Foley-he will need outpatient urology follow-up Resume Flomax closer to discharge  DVT prophylaxis: Eliquis resumed 8/19 Code Status: DNR Family Communication: Discussed with patient's son [name] on the phone and updated him in detail Given his failure to thrive over the past several months as he was walking previously-family expresses interest in palliative care which can be coordinated as an outpatient Disposition:  Status is: Inpatient Remains inpatient appropriate because:   Awaiting bone scan to determine duration of treatment Likely can discharge in 2 to 3 days   Consultants:  General surgery Critical care  Procedures:   Antimicrobials: Vanco and ceftriaxone   Subjective:  He is hard of hearing seems to understand some but gets a little confused He has no chest pain no fever I looked at his legs as well as his ankles and he has some breakdown    Objective: Vitals:   05/07/22 2349 05/08/22 0347 05/08/22 0354 05/08/22 0919  BP: 117/63 (!) 115/40  105/63  Pulse: 66 69  75  Resp: _0 Temp: 97.9 F (36.6 C) 97.9 F (36.6 C)  (!) 97.5 F (36.4 C)  TempSrc: Oral Oral  Oral  SpO2: 100% 99%  97%  Weight:   86.9 kg   Height:        Intake/Output Summary (Last 24 hours) at 05/08/2022 0948 Last data filed at 05/07/2022 2038 Gross per 24 hour  Intake 480 ml  Output 401 ml  Net 79 ml    Filed Weights   05/06/22 0500 05/07/22  0500 05/08/22 0354  Weight: 85.2 kg 86.6 kg 86.9 kg    Examination:  Awake somewhat coherent although quite hard of hearing so not sure how much she understands Chest is clear no rales rhonchi no wheeze ROM is intact he is moving upper limbs relatively well He has some limitations to movement of his lower extremities and is only able to roll them in the bed He has calcaneal decubiti bilaterally on both heels with the left side looking worse than the other and we have asked wound care to come by and look at them   Data Reviewed: personally reviewed   CBC    Component Value Date/Time   WBC 8.7 05/08/2022 0410   RBC 2.37 (L) 05/08/2022 0410   HGB 7.7 (L) 05/08/2022 0410   HCT 24.0 (L) 05/08/2022 0410   PLT 172 05/08/2022 0410   MCV 101.3 (H) 05/08/2022 0410   MCH 32.5 05/08/2022 0410   MCHC 32.1 05/08/2022 0410   RDW 15.8 (H) 05/08/2022 0410   LYMPHSABS 2.9 05/08/2022 0410   MONOABS 0.7 05/08/2022 0410   EOSABS 0.2 05/08/2022 0410   BASOSABS 0.0 05/08/2022 0410      Latest Ref Rng & Units 05/08/2022    4:10 AM 05/07/2022    5:00 AM 05/06/2022   12:37 AM  CMP  Glucose 70 - 99 mg/dL 152  131  168   BUN 8 - 23 mg/dL 41  41  43   Creatinine 0.61 - 1.24 mg/dL 1.67  1.70  1.62   Sodium 135 - 145 mmol/L 141  141  141   Potassium 3.5 - 5.1 mmol/L 4.2  4.1  4.5   Chloride 98 - 111 mmol/L 112  112  114   CO2 22 - 32 mmol/L _0 Calcium 8.9 - 10.3 mg/dL 7.5  7.8  7.7   Total Protein 6.5 - 8.1 g/dL  4.4    Total Bilirubin 0.3 - 1.2 mg/dL  0.6    Alkaline Phos 38 - 126 U/L  71    AST 15 - 41 U/L  46    ALT 0 - 44 U/L  5       Radiology Studies: No results found.   Scheduled Meds:  sodium chloride   Intravenous Once   sodium chloride   Intravenous Once   apixaban  2.5 mg Oral BID   ascorbic acid  500 mg Oral BID   Carbidopa-Levodopa ER  1 tablet Oral BID WC   Carbidopa-Levodopa ER  1 tablet Oral QPM   Chlorhexidine Gluconate Cloth  6 each Topical Daily   feeding  supplement  237 mL Oral TID BM   insulin aspart  0-5 Units Subcutaneous QHS   insulin aspart  0-9 Units Subcutaneous TID WC   levothyroxine  25 mcg Oral Daily   metoprolol succinate  12.5 mg Oral Daily   midodrine  10 mg Oral TID WC   multivitamin with minerals  1 tablet Oral Daily   sodium chloride flush  10-40 mL Intracatheter Q12H   sodium hypochlorite   Irrigation Daily   zinc sulfate  220 mg Oral Daily  Continuous Infusions:  sodium chloride     cefTRIAXone (ROCEPHIN)  IV 2 g (05/08/22 1035)   [START ON 05/09/2022] vancomycin       LOS: 7 days   Time spent: 67  Nita Sells, MD [facility] To contact the attending provider between 7A-7P or the covering provider during after hours 7P-7A, please log into the web site [email or web address] and access using universal [place] password for that web site. If you do not have the password, please call the hospital operator.  05/08/2022, 9:48 AM

## 2022-05-09 DIAGNOSIS — R6521 Severe sepsis with septic shock: Secondary | ICD-10-CM | POA: Diagnosis not present

## 2022-05-09 DIAGNOSIS — A419 Sepsis, unspecified organism: Secondary | ICD-10-CM | POA: Diagnosis not present

## 2022-05-09 LAB — GLUCOSE, CAPILLARY
Glucose-Capillary: 118 mg/dL — ABNORMAL HIGH (ref 70–99)
Glucose-Capillary: 205 mg/dL — ABNORMAL HIGH (ref 70–99)
Glucose-Capillary: 161 mg/dL — ABNORMAL HIGH (ref 70–99)
Glucose-Capillary: 77 mg/dL (ref 70–99)

## 2022-05-09 LAB — MAGNESIUM: Magnesium: 1.8 mg/dL (ref 1.7–2.4)

## 2022-05-09 LAB — VANCOMYCIN, PEAK: Vancomycin Pk: 16 ug/mL — ABNORMAL LOW (ref 30–40)

## 2022-05-09 MED ORDER — OXYCODONE HCL 5 MG PO TABS: 5.0000 mg | ORAL_TABLET | Freq: Four times a day (QID) | ORAL | 0 refills | Status: AC | PRN

## 2022-05-09 MED ORDER — LEVOFLOXACIN 250 MG PO TABS: 250.0000 mg | ORAL_TABLET | Freq: Every evening | ORAL | 0 refills | Status: AC

## 2022-05-09 MED ORDER — DOXYCYCLINE HYCLATE 100 MG PO TABS: 100.0000 mg | ORAL_TABLET | Freq: Two times a day (BID) | ORAL | 0 refills | Status: AC

## 2022-05-09 MED ORDER — ASCORBIC ACID 500 MG PO TABS: 500.0000 mg | ORAL_TABLET | Freq: Two times a day (BID) | ORAL | Status: AC

## 2022-05-09 MED ORDER — METRONIDAZOLE 500 MG PO TABS: 500.0000 mg | ORAL_TABLET | Freq: Two times a day (BID) | ORAL | 0 refills | Status: AC

## 2022-05-09 MED ORDER — LEVOFLOXACIN 250 MG PO TABS
250.0000 mg | ORAL_TABLET | Freq: Every evening | ORAL | Status: DC
  Administered 2022-05-09: 250 mg via ORAL
  Filled 2022-05-09 (×2): qty 1

## 2022-05-09 MED ORDER — METOPROLOL SUCCINATE ER 25 MG PO TB24: 12.5000 mg | ORAL_TABLET | Freq: Every day | ORAL | Status: AC

## 2022-05-09 MED ORDER — MIDODRINE HCL 10 MG PO TABS: 10.0000 mg | ORAL_TABLET | Freq: Three times a day (TID) | ORAL | Status: AC

## 2022-05-09 MED ORDER — ZINC SULFATE 220 (50 ZN) MG PO CAPS: 220.0000 mg | ORAL_CAPSULE | Freq: Every day | ORAL | 2 refills | Status: AC

## 2022-05-09 NOTE — Progress Notes (Signed)
Physical Therapy Wound Treatment and Discharge Patient Details  Name: Steven Ward MRN: [id number] Date of Birth: 01-10-1925  Today's Date: 05/09/2022 Time: 8309-4076 Time Calculation (min): 41 min  Subjective  Subjective Assessment Subjective: Pt pleasant and agreeable to hydrotherapy Patient and Family Stated Goals: pt did not state Date of Onset:  (unknown) Prior Treatments:  (unknown from SNF)  Pain Score:  Pt declined premedication and did not complain of pain throughout session.   Wound Assessment  Pressure Injury 02/25/22 Coccyx Unstageable - Full thickness tissue loss in which the base of the injury is covered by slough (yellow, tan, gray, green or brown) and/or eschar (tan, brown or black) in the wound bed. (Active)  Dressing Type Gauze (Comment);Foam - Lift dressing to assess site every shift;Dakin's-soaked gauze;Barrier Film (skin prep);Moist to moist 05/09/22 1400  Dressing Changed;Clean, Dry, Intact 05/09/22 1400  Dressing Change Frequency Daily 05/09/22 1400  State of Healing Early/partial granulation 05/09/22 1400  Site / Wound Assessment Pink;Red;Yellow;Black 05/09/22 1400  % Wound base Red or Granulating 75% 05/09/22 1400  % Wound base Yellow/Fibrinous Exudate 5% 05/09/22 1400  % Wound base Black/Eschar 10% 05/09/22 1400  % Wound base Other/Granulation Tissue (Comment) 10% 05/09/22 1400  Peri-wound Assessment Intact;Pink 05/09/22 1400  Wound Length (cm) 8 cm 05/03/22 0800  Wound Width (cm) 6 cm 05/03/22 0800  Wound Depth (cm) 2 cm 05/03/22 0800  Wound Surface Area (cm^2) 48 cm^2 05/03/22 0800  Wound Volume (cm^3) 96 cm^3 05/03/22 0800  Tunneling (cm) 4 05/02/22 0800  Margins Unattached edges (unapproximated) 05/09/22 1400  Drainage Amount Minimal 05/09/22 1400  Drainage Description Serosanguineous 05/09/22 1400  Treatment Irrigation;Other (Comment);Debridement (Selective) 05/09/22 1400      Selective Debridement Selective Debridement - Location:  Sacral Selective Debridement - Tools Used: Forceps, Scalpel, Scissors Selective Debridement - Tissue Removed: unviable tissue mostly around edges of the wound    Wound Assessment and Plan  Wound Therapy - Assess/Plan/Recommendations Wound Therapy - Clinical Statement: Significant improvement in wound bed with mostly healthy pink tissue present. Residual eschar around edges of the wound was able to be mostly debrided this session and feel the wound is appropriate for dressing changes by nursing staff moving forward. Hydrotherapy will sign off at this time. If needs change, please reconsult. Wound Therapy - Functional Problem List: limited mobility with prolonged time in bed Factors Delaying/Impairing Wound Healing: Altered sensation, Infection - systemic/local, Immobility Hydrotherapy Plan: Debridement, Dressing change, Patient/family education Wound Therapy - Frequency: Other (comment) (Tues/Fridays) Wound Therapy - Current Recommendations: PT Wound Therapy - Follow Up Recommendations: dressing changes by RN  Wound Therapy Goals- Improve the function of patient's integumentary system by progressing the wound(s) through the phases of wound healing (inflammation - proliferation - remodeling) by: Wound Therapy Goals - Improve the function of patient's integumentary system by progressing the wound(s) through the phases of wound healing by: Decrease Necrotic Tissue to: 10 % Decrease Necrotic Tissue - Progress: Met Increase Granulation Tissue to: 90% including healthy connective tissues Increase Granulation Tissue - Progress: Met Improve Drainage Characteristics: Min, Serous Improve Drainage Characteristics - Progress: Partly met Goals/treatment plan/discharge plan were made with and agreed upon by patient/family: Yes Time For Goal Achievement: 7 days Wound Therapy - Potential for Goals: Good  Goals will be updated until maximal potential achieved or discharge criteria met.  Discharge criteria:  when goals achieved, discharge from hospital, MD decision/surgical intervention, no progress towards goals, refusal/missing three consecutive treatments without notification or medical reason.  GP  Charges PT Wound Care Charges $Wound Debridement up to 20 cm: < or equal to 20 cm $PT Hydrotherapy Dressing: 2 dressings $PT Hydrotherapy Visit: 1 Visit       Thelma Comp 05/09/2022, 3:11 PM  Rolinda Roan, PT, DPT Acute Rehabilitation Services Secure Chat Preferred Office: [phone]

## 2022-05-09 NOTE — Progress Notes (Signed)
See separate dictated discharge summary dated 8/22-awaiting attempt at possible placement at skilled facility Stabilized medically for discharge but can probably discharge 05/10/2022

## 2022-05-09 NOTE — Consult Note (Signed)
   [facility] CM Inpatient Consult   05/09/2022  Steven Ward 1925/07/30 [id number]  Follow up:  High Risk; Readmission Prevention screen  [place] [ACO] Patient: [employer] with Embedded provider  Review for LLOS and high risk for unplanned readmission.  Patient is currently being recommended for a skilled nursing facility level of care for post hospital transition after review of PT/OT/WOC evals. Current needs to be met at a skilled nursing facility level of care pending insurance authorization noted from inpatient [facility] team notes. Watching TV on unit rounds.  Plan: Continue to follow inpatient fteam or disposition needs, if goes to a SNF then needs for post hospital care is to be met at that level of care.  For questions,  Natividad Brood, RN BSN [facility] Liaison  [phone] business mobile phone Toll free office [phone]  Fax number: [phone] [email or web address]

## 2022-05-09 NOTE — Evaluation (Signed)
Physical Therapy Evaluation Patient Details Name: Steven Ward MRN: [id number] DOB: 01-30-1925 Today's Date: 05/09/2022  History of Present Illness  88 yo male [facility]/14 with sacral wound, FTT with concern for septic shock. PMH includes: DM II, HLD, PVD, PD, PAF, CKD III, colon cancer, CVA, and BPH.  Clinical Impression   Pt presents with generalized weakness, impaired balance with history of falls, mod-max difficulty performing bed mobility and transfers, and decreased activity tolerance vs baseline.. Pt to benefit from acute PT to address deficits. Pt reached semi-standing position at bedside x2, fatigues quickly and heavily reliant on PT and OT physical assist. PT recommending SNF level of care post-acutely to address deficits. PT to progress mobility as tolerated, and will continue to follow acutely.         Recommendations for follow up therapy are one component of a multi-disciplinary discharge planning process, led by the attending physician.  Recommendations may be updated based on patient status, additional functional criteria and insurance authorization.  Follow Up Recommendations Skilled nursing-short term rehab (<3 hours/day) Can patient physically be transported by private vehicle: No    Assistance Recommended at Discharge Frequent or constant Supervision/Assistance  Patient can return home with the following  Two people to help with walking and/or transfers;Two people to help with bathing/dressing/bathroom    Equipment Recommendations None recommended by PT  Recommendations for Other Services       Functional Status Assessment Patient has had a recent decline in their functional status and demonstrates the ability to make significant improvements in function in a reasonable and predictable amount of time.     Precautions / Restrictions Precautions Precautions: Fall Precaution Comments: sacral wound, followed by wound care; air  bed Restrictions Weight Bearing Restrictions: No      Mobility  Bed Mobility Overal bed mobility: Needs Assistance Bed Mobility: Rolling, Supine to Sit, Sit to Supine Rolling: Mod assist, +2 for safety/equipment   Supine to sit: Max assist, +2 for physical assistance Sit to supine: Max assist, +2 for physical assistance   General bed mobility comments: mod +2 for rolling bilat for trunk and LE management, pt reaching with UEs to assist. max +2 for to/from EOB for trunk elevation/lowering, LE translation, and scooting to/from EOB. PT feels pt would have moved better if not on air bed.    Transfers Overall transfer level: Needs assistance Equipment used: 2 person hand held assist Transfers: Sit to/from Stand Sit to Stand: Max assist, +2 physical assistance           General transfer comment: max +2 for power up, rise, knee blocking L>R, and steadying. x2 attempts, reaching semi-standing position each time before pt reporting needing to sit.    Ambulation/Gait               General Gait Details: nt  Financial trader Rankin (Stroke Patients Only)       Balance Overall balance assessment: Needs assistance, History of Falls Sitting-balance support: No upper extremity supported, Feet supported Sitting balance-Leahy Scale: Fair     Standing balance support: Bilateral upper extremity supported, During functional activity Standing balance-Leahy Scale: Zero                               Pertinent Vitals/Pain Pain Assessment Pain Assessment: No/denies pain    Home Living Family/patient expects  to be discharged to:: Skilled nursing facility                        Prior Function Prior Level of Function : Needs assist             Mobility Comments: Per pt today, pt states he was walking with a walker up until 2 weeks ago. Per PT eval x2 months ago: pt states he ambulates with cane, has had 3-4  falls in last 6 months. states his son assists with stairs and is close for walking. no family present to confirm       Hand Dominance   Dominant Hand: Right    Extremity/Trunk Assessment   Upper Extremity Assessment Upper Extremity Assessment: Defer to OT evaluation    Lower Extremity Assessment Lower Extremity Assessment: Generalized weakness (<1/2 AROM hip flexion, knee flex/ext, hip abd/add but secondary to weakness)    Cervical / Trunk Assessment Cervical / Trunk Assessment: Kyphotic  Communication   Communication: No difficulties  Cognition Arousal/Alertness: Awake/alert Behavior During Therapy: WFL for tasks assessed/performed Overall Cognitive Status: Within Functional Limits for tasks assessed                                          General Comments General comments (skin integrity, edema, etc.): min BM on bed pad, PT and OT assisted pt in clean up. sacral dressing intact    Exercises     Assessment/Plan    PT Assessment Patient needs continued PT services  PT Problem List Decreased strength;Decreased mobility;Decreased activity tolerance;Decreased balance;Decreased knowledge of use of DME;Pain;Decreased range of motion;Decreased coordination;Decreased safety awareness       PT Treatment Interventions DME instruction;Therapeutic activities;Gait training;Therapeutic exercise;Patient/family education;Balance training;Stair training;Functional mobility training;Neuromuscular re-education    PT Goals (Current goals can be found in the Care Plan section)  Acute Rehab PT Goals Patient Stated Goal: return to PLOF PT Goal Formulation: With patient Time For Goal Achievement: 05/23/22 Potential to Achieve Goals: Fair    Frequency Min 2X/week     Co-evaluation PT/OT/SLP Co-Evaluation/Treatment: Yes Reason for Co-Treatment: For patient/therapist safety;To address functional/ADL transfers PT goals addressed during session: Mobility/safety with  mobility;Balance         AM-PAC PT "6 Clicks" Mobility  Outcome Measure Help needed turning from your back to your side while in a flat bed without using bedrails?: A Lot Help needed moving from lying on your back to sitting on the side of a flat bed without using bedrails?: A Lot Help needed moving to and from a bed to a chair (including a wheelchair)?: Total Help needed standing up from a chair using your arms (e.g., wheelchair or bedside chair)?: Total Help needed to walk in hospital room?: Total Help needed climbing 3-5 steps with a railing? : Total 6 Click Score: 8    End of Session Equipment Utilized During Treatment: Gait belt Activity Tolerance: Patient limited by fatigue Patient left: in bed;with call bell/phone within reach;Other (comment) (prevalon boots) Nurse Communication: Mobility status PT Visit Diagnosis: Other abnormalities of gait and mobility (R26.89);Muscle weakness (generalized) (M62.81)    Time: 6010-9323 PT Time Calculation (min) (ACUTE ONLY): 30 min   Charges:   PT Evaluation $PT Eval Low Complexity: 1 Low        Alois Mincer S, PT DPT Acute Rehabilitation Services Pager [phone]  Office [phone]   Hassie Mandt  E Stroup 05/09/2022, 11:39 AM

## 2022-05-09 NOTE — Discharge Summary (Signed)
Physician Discharge Summary  Steven Ward OZY:[id number] DOB: 08-Mar-1925 DOA: 05/01/2022  PCP: [name], MD  Admit date: 05/01/2022 Discharge date: 05/10/2022  Time spent: 33 minutes  Recommendations for Outpatient Follow-up:  Needs completion about 2 weeks antibiotics of Levaquin metronidazole as well as doxycycline for underlying chronic osteomyelitis Recommend Chem-12 CBC in about 1 week at skilled facility Wound care dressings as per below-also please float heels provide Prevalon boots and continue to dress calcaneal decubiti Careful use of glipizide in the outpatient setting 5 mg-consider 2.5 mg only if drops sugars--- no midodrine has been started this hospital stay because of shock and this can likely be weaned off--- also consider reinitiation of Aldactone versus the 3 times a week Lasix that he was getting control underlying HFpEF which is euvolemic Please change catheter/Foley in about 1 week If patient fails to thrive does not do well, please asked palliative care to come by and assess the patient in addition  Discharge Diagnoses:  MAIN problem for hospitalization   Septic shock on admission secondary to sacral decubiti Hypotension A-fib Underlying Parkinson's Calcaneal decubiti Hypoglycemia  Please see below for itemized issues addressed in HOpsital- refer to other progress notes for clarity if needed  Discharge Condition: Fair  Diet recommendation: Diabetic  Filed Weights   05/06/22 0500 05/07/22 0500 05/08/22 0354  Weight: 85.2 kg 86.6 kg 86.9 kg    History of present illness:  88 year old male paroxysmal A-fib CHA2DS2-VASc >4 previously not on anticoagulation-now on Eliquis HTN Parkinson's Lumbar spondylosis DM TY 2 CKD 3B Gallstones with pancreatic insufficiency and GERD history of remote colon cancer previous admissions for [facility]BO 03/07/2022 Acute punctate infarct at that time in addition   Admitted by critical care with septic shock from  infected sacral ulcer 05/01/2022 placed on pressors  Noted also AKI additionally 8/14-PICC line placed 8/15-transfused 8/17-sacral wound debridement to 8 x 6 x 3 cm with tunneling noted and hydrotherapy ordered by general surgery was not signed off 8/19-transferred to [facility] 8/20 bone scan ordered but patient refused completion of-Case subsequently discussed with ID physician Dr. Elba Barman who feels that 14 days treatment beyond what has been given is reasonable if we are suspecting chronic osteomyelitis  Hospital Course:  Septic shock on admission Shock physiology is resolved--on midodrine now because of borderline low normal blood pressures at 10 3 times daily--- might require down titration of this if blood pressures normalized Start implementing usual home meds  Large sacral decubitus ulcer status postdebridement 8/17 Appreciate general surgery input-twice daily dressings as per orders with Verdene Lennert solution--Hydrotherapy was done on patient and he maximally met the benefit as per their protocol see below for wound care instructions-- ESR CRP elevated-bone scan ordered 8/20 still pending Continues vancomycin and ceftriaxone at this time, pain control Oxy IR 5 every 6 as needed severe pain Continue zinc and micronutrients Calcaneal decubiti bilaterally Float heels turn as able--keep Prevalon boots and continue dressings as below Permanent A-fib CHADVASC >4 Episodic V. tach 7 beats Resume Eliquis 2.5 twice daily as no further procedures Resume Toprol XL but at lower dose 12.5 given some hypotension Magnesium close to normal range HFpEF last EF 55-60% Aldactone held from prior to admission, Lasix 3 times a week held from admission May need reinitiation as an outpatient Underlying Parkinson's disease Continue Sinemet 1 tab twice daily, and 1 tab in evening additionally Dysphagia He is awake coherent and does not have issues with dysphagia placed him on a regular diet Please watch  mentation  Anemia of acute blood loss Likely from procedure and blood loss--- ranges 7-8 received 1 unit on 8/15 DM TY 2 A1c about 7 Sugars ranging less than 200 Continuing sliding scale alone May be able to resume glipizide 5 daily in the outpatient setting CKD 3B He is at his baseline--labs as above Chronic indwelling Foley Patient has been at facility for month and was placed on Foley-he will need outpatient urology follow-up Resume Flomax closer to discharge    Discharge Exam: Vitals:   05/09/22 0753 05/09/22 1140  BP: 110/67 126/66  Pulse: 82 88  Resp: 18 20  Temp: 98 F (36.7 C) 98.3 F (36.8 C)  SpO2: 100% 95%    Subj on day of d/c   Coherent awake no distress no pain  General Exam on discharge  External ocular movements intact Power 5/5 No new issue ROM intact no focal deficits He is quite weak in lower extremities Sacral decubitus reviewed today as per lower picture     Discharge Instructions   Discharge Instructions     Diet - low sodium heart healthy   Complete by: As directed    Discharge wound care:   Complete by: As directed    See above instructions for wound care to both sacrum as well as to feet   Increase activity slowly   Complete by: As directed       Allergies as of 05/09/2022       Reactions   Amoxicillin    REACTION: rash, dizziness        Medication List     STOP taking these medications    escitalopram 10 MG tablet Commonly known as: LEXAPRO   furosemide 40 MG tablet Commonly known as: LASIX   insulin glargine 100 UNIT/ML injection Commonly known as: LANTUS   loratadine 10 MG tablet Commonly known as: CLARITIN   metoprolol tartrate 25 MG tablet Commonly known as: LOPRESSOR   spironolactone 25 MG tablet Commonly known as: ALDACTONE   tamsulosin 0.4 MG Caps capsule Commonly known as: FLOMAX       TAKE these medications    apixaban 2.5 MG Tabs tablet Commonly known as: ELIQUIS Take 1 tablet (2.5 mg  total) by mouth 2 (two) times daily.   ascorbic acid 500 MG tablet Commonly known as: VITAMIN C Take 1 tablet (500 mg total) by mouth 2 (two) times daily.   blood glucose meter kit and supplies Kit Dispense based on patient and insurance preference. Use up to four times daily as directed. (FOR ICD-9 250.00, 250.01).   Carbidopa-Levodopa ER 25-100 MG tablet controlled release Commonly known as: SINEMET CR Take 1 tablet by mouth 2 (two) times daily with breakfast and lunch.   Carbidopa-Levodopa ER 25-100 MG tablet controlled release Commonly known as: SINEMET CR Take 1 tablet by mouth every evening.   collagenase 250 UNIT/GM ointment Commonly known as: SANTYL Apply topically daily.   cyanocobalamin 1000 MCG/ML injection Commonly known as: VITAMIN B12 Inject 1,000 mcg into the muscle once a week. Inject 1015mcg intramuscularly one time a day every Monday for supplement for 4 weeks.   doxycycline 100 MG tablet Commonly known as: VIBRA-TABS Take 1 tablet (100 mg total) by mouth every 12 (twelve) hours for 13 days. What changed:  when to take this additional instructions   feeding supplement (PRO-STAT SUGAR FREE 64) Liqd Take 30 mLs by mouth 3 (three) times daily.   feeding supplement Liqd Commonly known as: BOOST / RESOURCE BREEZE Take 1 Container by  mouth in the morning, at noon, in the evening, and at bedtime.   glipiZIDE 5 MG tablet Commonly known as: GLUCOTROL Take one half tablet by mouth once daily.     Lancet Device Misc Use 1-4 times daily as needed or directed.  DX E11.9   levofloxacin 250 MG tablet Commonly known as: LEVAQUIN Take 1 tablet (250 mg total) by mouth every evening for 13 days.   levothyroxine 50 MCG tablet Commonly known as: SYNTHROID Take 50 mcg by mouth daily. What changed: Another medication with the same name was removed. Continue taking this medication, and follow the directions you see here.   magnesium oxide 400 (240 Mg) MG  tablet Commonly known as: MAG-OX Take 1 tablet (400 mg total) by mouth 2 (two) times daily.   megestrol 400 MG/10ML suspension Commonly known as: MEGACE Take 400 mg by mouth daily.   metoprolol succinate 25 MG 24 hr tablet Commonly known as: TOPROL-XL Take 0.5 tablets (12.5 mg total) by mouth daily.   metroNIDAZOLE 500 MG tablet Commonly known as: FLAGYL Take 1 tablet (500 mg total) by mouth every 12 (twelve) hours for 13 days.   midodrine 10 MG tablet Commonly known as: PROAMATINE Take 1 tablet (10 mg total) by mouth 3 (three) times daily with meals.   multivitamin with minerals Tabs tablet Take 1 tablet by mouth daily.   NovoLOG FlexPen 100 UNIT/ML FlexPen Generic drug: insulin aspart Inject 0-12 Units into the skin 3 (three) times daily before meals. Per sliding scale: if 0-140= 0 units, 150-200= 2 units, 201-250= 6 units, 251-300= 8 units, 301-350= 10 units, 351-400= 12 units, 401-999 call MD   Nu-Iron 150 MG capsule Generic drug: iron polysaccharides Take 150 mg by mouth daily.   ondansetron 4 MG disintegrating tablet Commonly known as: ZOFRAN-ODT Take 1 tablet (4 mg total) by mouth every 8 (eight) hours as needed for nausea or vomiting.   ONE TOUCH ULTRA 2 w/Device Kit Use as directed.   OneTouch Ultra test strip Generic drug: glucose blood USE TO TEST BLOOD SUGAR 3 TIMES A DAY   onetouch ultrasoft lancets Check 3 times daily. E11.9   oxyCODONE 5 MG immediate release tablet Commonly known as: Oxy IR/ROXICODONE Take 1 tablet (5 mg total) by mouth every 6 (six) hours as needed for severe pain.   polyethylene glycol 17 g packet Commonly known as: MIRALAX / GLYCOLAX Take 17 g by mouth daily as needed for moderate constipation.   polyvinyl alcohol 1.4 % ophthalmic solution Commonly known as: LIQUIFILM TEARS Place 2 drops into both eyes as needed for dry eyes (pleas eput at bedside for dry eyes).   sodium hypochlorite external solution Commonly known as:  DAKIN'S 1/2 STRENGTH Apply 1 Application topically every evening. Apply to sacral wound topically every evening shift for wound healing. Apply betadine to wound bed surface, cleanse with dakins solution, pack with dakins moistened guaze and cover with foam dressing every evening.   triamcinolone cream 0.1 % Commonly known as: KENALOG APPLY  CREAM EXTERNALLY TO AFFECTED AREA TWICE DAILY AS NEEDED What changed:  how much to take when to take this reasons to take this additional instructions   zinc sulfate 220 (50 Zn) MG capsule Take 1 capsule (220 mg total) by mouth daily.               Discharge Care Instructions  (From admission, onward)           Start     Ordered   05/09/22  0000  Discharge wound care:       Comments: See above instructions for wound care to both sacrum as well as to feet   05/09/22 1532   05/09/22 0000  Discharge wound care:       Comments: Wound Care Orders (From admission, onward)      Start     Ordered   05/09/22 0500    Wound care  Daily at 5am      Comments: Cleanse the feet with soap and water, rinse and pat dry. Apply a cut to fit piece of Aquacel Advantage (lawson # [id number]) over the heel wounds and secure with a foam dressing. Change Aquacel Daily.  05/08/22 1443   05/07/22 2200    Wound care  Every 12 hours    Comments: Sacral wound: Moisten kerlix with 1/4% Dakin's fluff fill wound bed, top with ABD pads, secure with tape.  Change daily PT for hydrotherapy ordered, bedside nursing to perform on days when no hydrotherapy performed.  Verify hydrotherapy schedule with PT department please.  05/07/22 1358   05/02/22 1000    Foam dressing  Every 3 days     Comments: Silicone foam dressings to the left and right heel wounds,change every 3 days. Assess under dressings each shift for any acute changes in the wounds.   05/09/22 1537           Allergies  Allergen Reactions   Amoxicillin     REACTION: rash, dizziness      The  results of significant diagnostics from this hospitalization (including imaging, microbiology, ancillary and laboratory) are listed below for reference.    Significant Diagnostic Studies: NM Bone Scan 3 Phase  Result Date: 05/08/2022 CLINICAL DATA:  Concern for osteomyelitis EXAM: NUCLEAR MEDICINE 3-PHASE BONE SCAN TECHNIQUE: Radionuclide angiographic images, immediate static blood pool images. The 3-hour delayed static images were not obtained. Patient refused to return for delayed images. RADIOPHARMACEUTICALS:  20.5 mCi Tc-52m MDP IV COMPARISON:  CT 05/01/2022 FINDINGS: Vascular phase: No hyperemia in the sacrum to suggest osteomyelitis. Blood pool phase: No blood pool activity in the region the sacrum to suggest osteomyelitis. Delayed phase: Not obtained by patient choice IMPRESSION: No evidence of osteomyelitis in the sacrum on vascular phase and blood pool phase. Electronically Signed   By: Suzy Bouchard M.D.   On: 05/08/2022 17:08   DG CHEST PORT 1 VIEW  Result Date: 05/01/2022 CLINICAL DATA:  Status post PICC line placement. EXAM: PORTABLE CHEST 1 VIEW COMPARISON:  May 01, 2022 (1:18 p.m.) FINDINGS: Since the prior study there is been interval placement of a right-sided PICC line. Its distal tip is approximately 2.5 cm distal to the junction of the superior vena cava and right atrium. The heart size and mediastinal contours are within normal limits. Diffuse, chronic appearing increased lung markings are seen with mild areas of atelectasis and/or infiltrate noted within the bilateral lung bases. Small bilateral pleural effusions are noted. No pneumothorax is identified. Radiopaque surgical clips are seen within the right upper quadrant. A chronic eighth right rib fracture is noted. IMPRESSION: 1. Right-sided PICC line positioning as described above. 2. Chronic appearing increased lung markings with mild bibasilar atelectasis and/or infiltrate. 3. Small bilateral pleural effusions. Electronically  Signed   By: Virgina Norfolk M.D.   On: 05/01/2022 21:22   [place] EKG SITE RITE  Result Date: 05/01/2022 If Site Rite image not attached, placement could not be confirmed due to current cardiac rhythm.  CT PELVIS WO CONTRAST  Result Date: 05/01/2022 CLINICAL DATA:  Sacral decubitus ulcer, further evaluate for abscess or deep infection EXAM: CT PELVIS WITHOUT CONTRAST TECHNIQUE: Multidetector CT imaging of the pelvis was performed following the standard protocol without intravenous contrast. RADIATION DOSE REDUCTION: This exam was performed according to the departmental dose-optimization program which includes automated exposure control, adjustment of the mA and/or kV according to patient size and/or use of iterative reconstruction technique. COMPARISON:  CT chest abdomen pelvis, 02/22/2022 FINDINGS: Urinary Tract:  Urinary bladder decompressed by Foley catheter. Bowel: Evidence of prior rectosigmoid colon resection and reanastomosis. Large burden of stool in the included colon. Vascular/Lymphatic: No pathologically enlarged lymph nodes. Aortic atherosclerosis Reproductive:  No mass or other significant abnormality Other:  Anasarca. Musculoskeletal: No suspicious bone lesions identified. Sacral decubitus ulceration with tunneling subcutaneous air and fluid collection measuring at least 7.6 x 3.8 x 2.4 cm (series 3, image 35, series 6, image 98). Overlying dressing material. No significant bony erosion or sclerosis of the underlying sacrum or coccyx. IMPRESSION: 1. Sacral decubitus ulceration with tunneling subcutaneous air and fluid collection measuring at least 7.6 x 3.8 x 2.4 cm. Overlying dressing material. 2. No significant bony erosion or sclerosis of the underlying sacrum or coccyx. MRI is the test of choice for the evaluation of osteomyelitis if suspected. 3. Anasarca. 4. Evidence of prior rectosigmoid colon resection and reanastomosis. Large burden of stool in the included colon. Aortic Atherosclerosis  (ICD10-I70.0). Electronically Signed   By: Delanna Ahmadi M.D.   On: 05/01/2022 16:01   DG Chest Port 1 View  Result Date: 05/01/2022 CLINICAL DATA:  Questionable sepsis. Sacral wound limits positioning by report from the technologists. EXAM: PORTABLE CHEST 1 VIEW COMPARISON:  February 28, 2022 FINDINGS: Lung volumes are diminished compared to previous imaging and images rotated slightly to the RIGHT. Accounting for this cardiomediastinal contours and hilar structures are stable. There is graded opacity at the RIGHT and LEFT lung base that is increased since previous imaging. Added density over the LEFT chest is likely due in part to overlapping soft tissues. No lobar consolidative process. On limited assessment no acute skeletal findings. IMPRESSION: 1. Increased density over the LEFT chest favored to represent overlapping soft tissues. Lung volumes are diminished compared to previous imaging with potential increase in pleural fluid since the prior study. 2. No signs of lobar consolidation. Electronically Signed   By: Zetta Bills M.D.   On: 05/01/2022 13:32    Microbiology: Recent Results (from the past 240 hour(s))  Resp Panel by RT-PCR (Flu A&B, Covid) Anterior Nasal Swab     Status: None   Collection Time: 05/01/22 12:40 PM   Specimen: Anterior Nasal Swab  Result Value Ref Range Status   SARS Coronavirus 2 by RT PCR NEGATIVE NEGATIVE Final    Comment: (NOTE) SARS-CoV-2 target nucleic acids are NOT DETECTED.  The SARS-CoV-2 RNA is generally detectable in upper respiratory specimens during the acute phase of infection. The lowest concentration of SARS-CoV-2 viral copies this assay can detect is 138 copies/mL. A negative result does not preclude SARS-Cov-2 infection and should not be used as the sole basis for treatment or other patient management decisions. A negative result may occur with  improper specimen collection/handling, submission of specimen other than nasopharyngeal swab, presence  of viral mutation(s) within the areas targeted by this assay, and inadequate number of viral copies(<138 copies/mL). A negative result must be combined with clinical observations, patient history, and epidemiological information. The expected result is Negative.  Fact Sheet for Patients:  EntrepreneurPulse.com.au  Fact Sheet for Healthcare Providers:  IncredibleEmployment.be  This test is no t yet approved or cleared by the [place] FDA and  has been authorized for detection and/or diagnosis of SARS-CoV-2 by FDA under an Emergency Use Authorization (EUA). This EUA will remain  in effect (meaning this test can be used) for the duration of the COVID-19 declaration under Section 564(b)(1) of the Act, 21 U.S.C.section 360bbb-3(b)(1), unless the authorization is terminated  or revoked sooner.       Influenza A by PCR NEGATIVE NEGATIVE Final   Influenza B by PCR NEGATIVE NEGATIVE Final    Comment: (NOTE) The Xpert Xpress SARS-CoV-2/FLU/RSV plus assay is intended as an aid in the diagnosis of influenza from Nasopharyngeal swab specimens and should not be used as a sole basis for treatment. Nasal washings and aspirates are unacceptable for Xpert Xpress SARS-CoV-2/FLU/RSV testing.  Fact Sheet for Patients: EntrepreneurPulse.com.au  Fact Sheet for Healthcare Providers: IncredibleEmployment.be  This test is not yet approved or cleared by the [place] FDA and has been authorized for detection and/or diagnosis of SARS-CoV-2 by FDA under an Emergency Use Authorization (EUA). This EUA will remain in effect (meaning this test can be used) for the duration of the COVID-19 declaration under Section 564(b)(1) of the Act, 21 U.S.C. section 360bbb-3(b)(1), unless the authorization is terminated or revoked.  Performed at [facility], [place] [address], [place], [place] [address]   Blood Culture (routine x 2)      Status: None   Collection Time: 05/01/22 12:40 PM   Specimen: BLOOD  Result Value Ref Range Status   Specimen Description BLOOD SITE NOT SPECIFIED  Final   Special Requests   Final    BOTTLES DRAWN AEROBIC AND ANAEROBIC Blood Culture results may not be optimal due to an excessive volume of blood received in culture bottles   Culture   Final    NO GROWTH 5 DAYS Performed at [facility], [place] [address], [place], [place] [address]    Report Status 05/06/2022 FINAL  Final  Urine Culture     Status: Abnormal   Collection Time: 05/01/22 12:40 PM   Specimen: In/Out Cath Urine  Result Value Ref Range Status   Specimen Description IN/OUT CATH URINE  Final   Special Requests NONE  Final   Culture (A)  Final    >=100,000 COLONIES/mL DIPHTHEROIDS(CORYNEBACTERIUM SPECIES) Standardized susceptibility testing for this organism is not available. Performed at [facility], [place] [address], [place], [place] [address]    Report Status 05/03/2022 FINAL  Final  Blood Culture (routine x 2)     Status: None   Collection Time: 05/01/22 12:45 PM   Specimen: BLOOD  Result Value Ref Range Status   Specimen Description BLOOD SITE NOT SPECIFIED  Final   Special Requests   Final    BOTTLES DRAWN AEROBIC AND ANAEROBIC Blood Culture results may not be optimal due to an excessive volume of blood received in culture bottles   Culture   Final    NO GROWTH 5 DAYS Performed at [facility], [place] [address] [place], [place] [address]    Report Status 05/06/2022 FINAL  Final  MRSA Next Gen by PCR, Nasal     Status: Abnormal   Collection Time: 05/01/22  6:37 PM   Specimen: Nasal Mucosa; Nasal Swab  Result Value Ref Range Status   MRSA by PCR Next Gen DETECTED (A) NOT DETECTED Final    Comment: RESULT  CALLED TO, READ BACK BY AND VERIFIED WITH: RN CHRISTINA SALAS ON 05/01/22 @ 2119 BY DRT (NOTE) The GeneXpert MRSA Assay (FDA approved for NASAL specimens only), is one component of a  comprehensive MRSA colonization surveillance program. It is not intended to diagnose MRSA infection nor to guide or monitor treatment for MRSA infections. Test performance is not FDA approved in patients less than 19 years old. Performed at [facility], [place] [address] [place], [place] [address]      Labs: Basic Metabolic Panel: Recent Labs  Lab 05/03/22 0507 05/04/22 0355 05/05/22 0334 05/06/22 0037 05/07/22 0500 05/08/22 0410 05/09/22 0354  NA 140 140 137 141 141 141  --   K 3.7 3.8 4.0 4.5 4.1 4.2  --   CL 113* 113* 112* 114* 112* 112*  --   CO2 22 21* 21* $Remov'24 23 23  'LNsYfL$ --   GLUCOSE 65* 80 166* 168* 131* 152*  --   BUN 55* 51* 46* 43* 41* 41*  --   CREATININE 1.67* 1.72* 1.65* 1.62* 1.70* 1.67*  --   CALCIUM 7.8* 7.7* 7.6* 7.7* 7.8* 7.5*  --   MG 2.1 2.0  --  1.8 1.8 1.9 1.8  PHOS 2.4*  --   --   --   --   --   --    Liver Function Tests: Recent Labs  Lab 05/07/22 0500  AST 46*  ALT 5  ALKPHOS 71  BILITOT 0.6  PROT 4.4*  ALBUMIN <1.5*   No results for input(s): "LIPASE", "AMYLASE" in the last 168 hours. No results for input(s): "AMMONIA" in the last 168 hours. CBC: Recent Labs  Lab 05/04/22 0355 05/05/22 0334 05/06/22 0037 05/07/22 0500 05/08/22 0410  WBC 8.7 6.6 7.7 7.8 8.7  NEUTROABS  --   --   --  4.3 4.8  HGB 9.1* 8.2* 7.9* 7.8* 7.7*  HCT 27.7* 24.9* 23.9* 23.3* 24.0*  MCV 99.3 100.0 100.4* 100.9* 101.3*  PLT 210 161 155 165 172   Cardiac Enzymes: No results for input(s): "CKTOTAL", "CKMB", "CKMBINDEX", "TROPONINI" in the last 168 hours. BNP: BNP (last 3 results) No results for input(s): "BNP" in the last 8760 hours.  ProBNP (last 3 results) No results for input(s): "PROBNP" in the last 8760 hours.  CBG: Recent Labs  Lab 05/08/22 1800 05/08/22 1929 05/08/22 2055 05/09/22 0807 05/09/22 1138  GLUCAP 54* 81 83 118* 205*       Signed:  Nita Sells MD   [facility] 05/09/2022, 3:32 PM

## 2022-05-09 NOTE — Inpatient Diabetes Management (Signed)
Inpatient Diabetes Program Recommendations  AACE/ADA: New Consensus Statement on Inpatient Glycemic Control (2015)  Target Ranges:  Prepandial:   less than 140 mg/dL      Peak postprandial:   less than 180 mg/dL (1-2 hours)      Critically ill patients:  140 - 180 mg/dL   Lab Results  Component Value Date   GLUCAP 205 (H) 05/09/2022   HGBA1C 7.9 (H) 02/24/2022    Review of Glycemic Control  Latest Reference Range & Units 05/08/22 18:00 05/08/22 19:29 05/08/22 20:55 05/09/22 08:07 05/09/22 11:38  Glucose-Capillary 70 - 99 mg/dL 54 (L) 81 83 118 (H) 205 (H)  (L): Data is abnormally low (H): Data is abnormally high Diabetes history: Type 2 DM Outpatient Diabetes medications: Lantus 10 units QD, Novolog 0-12 units TID Current orders for Inpatient glycemic control: Novolog 0-9 units TID, Novolog 0-5 units QHS  Inpatient Diabetes Program Recommendations:    Noted hypoglycemia of 54 mg/dL following 1 units of Novolog yesterday. Consider decreasing correction to Novolog 0-6 units TID.   Thanks, Bronson Curb, MSN, RNC-OB Diabetes Coordinator [phone] (8a-5p)

## 2022-05-09 NOTE — NC FL2 (Signed)
[place] LEVEL OF CARE SCREENING TOOL     IDENTIFICATION  Patient Name: Steven Ward Birthdate: Jan 04, 1925 Sex: male Admission Date (Current Location): 05/01/2022  [facility] and [place] Number:  Herbalist and Address:  [facility] [place], [place] [address], [place], [place] [address]      Provider Number: [id number]  Attending Physician Name and Address:  Nita Sells, MD  Relative Name and Phone Number:       Current Level of Care: Hospital Recommended Level of Care: [place] Prior Approval Number:    Date Approved/Denied:   PASRR Number:    Discharge Plan: SNF    Current Diagnoses: Patient Active Problem List   Diagnosis Date Noted   Malnutrition of moderate degree 05/05/2022   Septic shock ([place]) 05/01/2022   Protein-calorie malnutrition, severe 03/11/2022   CVA (cerebral vascular accident) ([place]) 03/10/2022   Cerebrovascular accident (CVA) due to embolism of right cerebellar artery ([place])    Constipation 03/07/2022   Hypophosphatemia 03/02/2022   Small bowel obstruction ([place]) 03/01/2022   Hypokalemia 03/01/2022   Hypomagnesemia 03/01/2022   Stage 3b chronic kidney disease ([place]) 42/35/3614   Metabolic acidosis 43/15/4008   Acute CVA (cerebrovascular accident) ([place]) 03/01/2022   Pneumonia 03/01/2022   Hypothyroidism 03/01/2022   Compression fracture of body of thoracic vertebra ([place]) 03/01/2022   Pleural plaque 03/01/2022   Pulmonary fibrosis ([place]) 03/01/2022   Right lower lobe lung mass 03/01/2022   Decubitus ulcer 02/26/2022   Paroxysmal atrial fibrillation ([place]) 02/22/2022   Generalized weakness 02/22/2022   Nausea & vomiting 02/22/2022   Acute kidney injury superimposed on chronic kidney disease ([place]) 02/22/2022   Anemia 07/06/2021   Atrial flutter ([place]) 07/06/2021   Disease of pancreas 07/06/2021   Encounter for immunization 07/06/2021   Enlarged prostate 07/06/2021   Hyperlipidemia  07/06/2021   Malignant neoplasm of colon ([place]) 07/06/2021   Other B-complex deficiencies 07/06/2021   Peripheral vascular disease ([place]) 07/06/2021   Pure hypercholesterolemia 07/06/2021   At moderate risk for fall 08/05/2019   Parkinson's disease ([place]) 01/15/2018   Dyslipidemia 04/08/2017   Major depressive episode 04/19/2016   CKD stage 3 due to type 2 diabetes mellitus ([place]) 08/25/2015   Spinal stenosis of lumbar region 05/25/2015   BPH (benign prostatic hyperplasia) 11/10/2013   Bilateral lumbar radiculopathy 11/10/2013   Obesity (BMI 30-39.9) 04/21/2013   Dyspnea 06/23/2011   PAF (paroxysmal atrial fibrillation) ([place]) 06/23/2011   SKIN RASH 01/17/2010   ABNORMAL THYROID FUNCTION TESTS 01/17/2010   EDEMA LEG 05/04/2009   GALLSTONES 04/07/2009   ABDOMINAL PAIN RIGHT UPPER QUADRANT 04/07/2009   DIARRHEA 02/26/2009   T2DM (type 2 diabetes mellitus) ([place]) 12/01/2008   Primary hypertension 12/01/2008   ALLERGIC RHINITIS 12/01/2008   GERD 12/01/2008   SPONDYLOSIS, LUMBAR 12/01/2008   COLONIC POLYPS, HX OF 12/01/2008    Orientation RESPIRATION BLADDER Height & Weight     Self  Normal Continent Weight: 86.9 kg Height:  '5\' 8"'$  (172.7 cm)  BEHAVIORAL SYMPTOMS/MOOD NEUROLOGICAL BOWEL NUTRITION STATUS      Incontinent Diet (Regular with thin liquids)  AMBULATORY STATUS COMMUNICATION OF NEEDS Skin   Extensive Assist Verbally Other (Comment) (stage 4 to sacrum/ bilateral heel wounds)                       Personal Care Assistance Level of Assistance  Bathing, Feeding, Dressing Bathing Assistance: Maximum assistance Feeding assistance: Maximum assistance Dressing Assistance: Maximum assistance  Functional Limitations Info  Sight, Hearing, Speech Sight Info: Adequate Hearing Info: Adequate Speech Info: Adequate    SPECIAL CARE FACTORS FREQUENCY  PT (By licensed PT), OT (By licensed OT)     PT Frequency: 5x/wk OT Frequency: 5x/wk            Contractures  Contractures Info: Not present    Additional Factors Info  Code Status, Psychotropic, Insulin Sliding Scale Code Status Info: DNR   Psychotropic Info: Sinemet CR 25-100 at breakfast/ lunch/ bedtime Insulin Sliding Scale Info: Novolog 0-5 units at bedtime/ Novolog 0-9 units TID       Current Medications (05/09/2022):  This is the current hospital active medication list Current Facility-Administered Medications  Medication Dose Route Frequency Provider Last Rate Last Admin   0.9 %  sodium chloride infusion  250 mL Intravenous Continuous Gleason, Otilio Carpen, PA-C       acetaminophen (TYLENOL) tablet 650 mg  650 mg Oral Q6H PRN Gleason, Otilio Carpen, PA-C       apixaban Arne [place]) tablet 2.5 mg  2.5 mg Oral BID Nita Sells, MD   2.5 mg at 05/08/22 2118   ascorbic acid (VITAMIN C) tablet 500 mg  500 mg Oral BID Collene Gobble, MD   500 mg at 05/08/22 2118   Carbidopa-Levodopa ER (SINEMET CR) 25-100 MG tablet controlled release 1 tablet  1 tablet Oral BID WC Gleason, Otilio Carpen, PA-C   1 tablet at 05/08/22 1312   Carbidopa-Levodopa ER (SINEMET CR) 25-100 MG tablet controlled release 1 tablet  1 tablet Oral QPM Gleason, Otilio Carpen, PA-C   1 tablet at 05/08/22 1801   Chlorhexidine Gluconate Cloth 2 % PADS 6 each  6 each Topical Daily Gleason, Otilio Carpen, PA-C   6 each at 05/08/22 1022   docusate sodium (COLACE) capsule 100 mg  100 mg Oral BID PRN Gleason, Otilio Carpen, PA-C       doxycycline (VIBRA-TABS) tablet 100 mg  100 mg Oral Q12H Nita Sells, MD   100 mg at 05/08/22 2118   feeding supplement (ENSURE ENLIVE / ENSURE PLUS) liquid 237 mL  237 mL Oral TID BM Collene Gobble, MD   237 mL at 05/08/22 2119   insulin aspart (novoLOG) injection 0-5 Units  0-5 Units Subcutaneous QHS Noemi Chapel P, DO       insulin aspart (novoLOG) injection 0-9 Units  0-9 Units Subcutaneous TID WC Noemi Chapel P, DO   1 Units at 05/08/22 1332   levofloxacin (LEVAQUIN) tablet 500 mg  500 mg Oral QPM Nita Sells, MD    500 mg at 05/08/22 1801   levothyroxine (SYNTHROID) tablet 25 mcg  25 mcg Oral Daily Gleason, Otilio Carpen, PA-C   25 mcg at 05/09/22 4098   metoprolol succinate (TOPROL-XL) 24 hr tablet 12.5 mg  12.5 mg Oral Daily Nita Sells, MD   12.5 mg at 05/08/22 0845   metroNIDAZOLE (FLAGYL) tablet 500 mg  500 mg Oral Q12H Nita Sells, MD   500 mg at 05/08/22 2119   midodrine (PROAMATINE) tablet 10 mg  10 mg Oral TID WC Jennelle Human B, NP   10 mg at 05/08/22 1800   multivitamin with minerals tablet 1 tablet  1 tablet Oral Daily Collene Gobble, MD   1 tablet at 05/08/22 1021   ondansetron (ZOFRAN-ODT) disintegrating tablet 4 mg  4 mg Oral Q8H PRN Gleason, Otilio Carpen, PA-C       Oral care mouth rinse  15 mL Mouth Rinse PRN Samtani,  Jai-Gurmukh, MD       oxyCODONE (Oxy IR/ROXICODONE) immediate release tablet 5 mg  5 mg Oral Q6H PRN Gleason, Otilio Carpen, PA-C   5 mg at 05/07/22 2101   polyethylene glycol (MIRALAX / GLYCOLAX) packet 17 g  17 g Oral Daily PRN Gleason, Otilio Carpen, PA-C       sodium chloride flush (NS) 0.9 % injection 10-40 mL  10-40 mL Intracatheter Q12H Margaretha Seeds, MD   10 mL at 05/08/22 2119   sodium chloride flush (NS) 0.9 % injection 10-40 mL  10-40 mL Intracatheter PRN Margaretha Seeds, MD       sodium hypochlorite (DAKIN'S 1/4 STRENGTH) topical solution   Irrigation Daily Rolm Bookbinder, MD   Given at 05/08/22 1022   zinc sulfate capsule 220 mg  220 mg Oral Daily Collene Gobble, MD   220 mg at 05/08/22 0845     Discharge Medications: Please see discharge summary for a list of discharge medications.  Relevant Imaging Results:  Relevant Lab Results:   Additional Information SSN:[id number]  Pollie Friar, RN

## 2022-05-09 NOTE — Evaluation (Signed)
Occupational Therapy Evaluation Patient Details Name: Steven Ward MRN: [id number] DOB: 1925/01/30 Today's Date: 05/09/2022   History of Present Illness 88 yo male [facility] with sacral wound, FTT with concern for septic shock. PMH includes: DM II, HLD, PVD, PD, PAF, CKD III, colon cancer, CVA, and BPH.   Clinical Impression   Pt needing assist at baseline for ADLs and mobility with RW, seen in conjunction with PT to maximize mobility progression. Pt needing mod-max A +2 for bed mobility, rolling multiple times for pericare/pad placement. Mod-max A +2 for ADLs and x2 standing attempts at EOB. Pt presenting with impairments listed below, will follow acutely, recommend SNF at d/c.      Recommendations for follow up therapy are one component of a multi-disciplinary discharge planning process, led by the attending physician.  Recommendations may be updated based on patient status, additional functional criteria and insurance authorization.   Follow Up Recommendations  Skilled nursing-short term rehab (<3 hours/day)    Assistance Recommended at Discharge Frequent or constant Supervision/Assistance  Patient can return home with the following Two people to help with walking and/or transfers;Two people to help with bathing/dressing/bathroom;Assistance with cooking/housework;Direct supervision/assist for medications management;Direct supervision/assist for financial management;Assist for transportation;Help with stairs or ramp for entrance    Functional Status Assessment  Patient has had a recent decline in their functional status and demonstrates the ability to make significant improvements in function in a reasonable and predictable amount of time.  Equipment Recommendations  None recommended by OT;Other (comment) (defer)    Recommendations for Other Services PT consult     Precautions / Restrictions Precautions Precautions: Fall Precaution Comments: sacral wound,  followed by wound care; air bed Restrictions Weight Bearing Restrictions: No      Mobility Bed Mobility Overal bed mobility: Needs Assistance Bed Mobility: Rolling, Supine to Sit, Sit to Supine Rolling: Mod assist, +2 for safety/equipment   Supine to sit: Max assist, +2 for physical assistance Sit to supine: Max assist, +2 for physical assistance   General bed mobility comments: mod +2 for rolling bilat for trunk and LE management, pt reaching with UEs to assist. max +2 for to/from EOB for trunk elevation/lowering, LE translation, and scooting to/from EOB. PT feels pt would have moved better if not on air bed.    Transfers Overall transfer level: Needs assistance Equipment used: 2 person hand held assist Transfers: Sit to/from Stand Sit to Stand: Max assist, +2 physical assistance           General transfer comment: max +2 for power up, rise, knee blocking L>R, and steadying. x2 attempts, reaching semi-standing position each time before pt reporting needing to sit.      Balance                                           ADL either performed or assessed with clinical judgement   ADL Overall ADL's : Needs assistance/impaired Eating/Feeding: Supervision/ safety   Grooming: Supervision/safety   Upper Body Bathing: Moderate assistance;Bed level   Lower Body Bathing: Maximal assistance;Sitting/lateral leans;Bed level   Upper Body Dressing : Moderate assistance;Sitting   Lower Body Dressing: Maximal assistance;Bed level   Toilet Transfer: Maximal assistance;+2 for physical assistance   Toileting- Clothing Manipulation and Hygiene: Maximal assistance       Functional mobility during ADLs: Maximal assistance;+2 for physical assistance  Vision   Vision Assessment?: No apparent visual deficits     Perception     Praxis      Pertinent Vitals/Pain Pain Assessment Pain Assessment: No/denies pain     Hand Dominance Right    Extremity/Trunk Assessment Upper Extremity Assessment Upper Extremity Assessment: Generalized weakness   Lower Extremity Assessment Lower Extremity Assessment: Defer to PT evaluation   Cervical / Trunk Assessment Cervical / Trunk Assessment: Kyphotic   Communication Communication Communication: No difficulties   Cognition Arousal/Alertness: Awake/alert Behavior During Therapy: WFL for tasks assessed/performed Overall Cognitive Status: Within Functional Limits for tasks assessed                                       General Comments  min BM on bed pad, PT and OT assisted pt in clean up. sacral dressing intact    Exercises     Shoulder Instructions      Home Living Family/patient expects to be discharged to:: Skilled nursing facility                                        Prior Functioning/Environment Prior Level of Function : Needs assist             Mobility Comments: Per pt today, pt states he was walking with a walker up until 2 weeks ago. Per PT eval x2 months ago: pt states he ambulates with cane, has had 3-4 falls in last 6 months. states his son assists with stairs and is close for walking. no family present to confirm ADLs Comments: needs assist per chart review        OT Problem List: Impaired balance (sitting and/or standing);Decreased activity tolerance;Decreased range of motion;Decreased strength;Decreased safety awareness      OT Treatment/Interventions: Self-care/ADL training;Therapeutic exercise;Energy conservation;Therapeutic activities;Patient/family education;Balance training    OT Goals(Current goals can be found in the care plan section) Acute Rehab OT Goals Patient Stated Goal: none stated OT Goal Formulation: With patient Time For Goal Achievement: 05/23/22 Potential to Achieve Goals: Fair  OT Frequency: Min 2X/week    Co-evaluation PT/OT/SLP Co-Evaluation/Treatment: Yes Reason for Co-Treatment: For  patient/therapist safety;To address functional/ADL transfers PT goals addressed during session: Mobility/safety with mobility;Balance OT goals addressed during session: ADL's and self-care      AM-PAC OT "6 Clicks" Daily Activity     Outcome Measure Help from another person eating meals?: A Little Help from another person taking care of personal grooming?: A Little Help from another person toileting, which includes using toliet, bedpan, or urinal?: A Lot Help from another person bathing (including washing, rinsing, drying)?: A Lot Help from another person to put on and taking off regular upper body clothing?: A Lot Help from another person to put on and taking off regular lower body clothing?: A Lot 6 Click Score: 14   End of Session Equipment Utilized During Treatment: Gait belt Nurse Communication: Mobility status  Activity Tolerance: Patient tolerated treatment well Patient left: in bed;with call bell/phone within reach  OT Visit Diagnosis: Unsteadiness on feet (R26.81);Other abnormalities of gait and mobility (R26.89);Muscle weakness (generalized) (M62.81);History of falling (Z91.81)                Time: 0973-5329 OT Time Calculation (min): 28 min Charges:  OT General Charges $OT Visit: 1 Visit OT  Evaluation $OT Eval Moderate Complexity: 1 [address] OTD, OTR/L Acute Rehab [phone] - [place] 05/09/2022, 11:51 AM

## 2022-05-09 NOTE — TOC Progression Note (Signed)
Transition of Care [facility]) - Progression Note    Patient Details  Name: Steven Ward MRN: [id number] Date of Birth: September 24, 1924  Transition of Care [facility]) CM/SW Contact  Pollie Friar, RN Phone Number: 05/09/2022, 2:30 PM  Clinical Narrative:    Son [name] provided bed offers this am. He has selected [facility]. CM has called in for authorization for SNF rehab to [facility].  TOC following.   Expected Discharge Plan: [place] Barriers to Discharge: Continued Medical Work up  Expected Discharge Plan and Services Expected Discharge Plan: [place] In-house Referral: Clinical Social Work     Living arrangements for the past 2 months: [place]                                       Social Determinants of Health (SDOH) Interventions    Readmission Risk Interventions    03/01/2022    2:59 PM  Readmission Risk Prevention Plan  Transportation Screening Complete  PCP or Specialist Appt within 3-5 Days Complete  HRI or [place] Complete  Social Work Consult for [place] Planning/Counseling Complete  Palliative Care Screening Not Applicable  Medication Review Press photographer) Complete

## 2022-05-10 DIAGNOSIS — R627 Adult failure to thrive: Secondary | ICD-10-CM | POA: Diagnosis not present

## 2022-05-10 DIAGNOSIS — R21 Rash and other nonspecific skin eruption: Secondary | ICD-10-CM | POA: Diagnosis not present

## 2022-05-10 DIAGNOSIS — L89154 Pressure ulcer of sacral region, stage 4: Secondary | ICD-10-CM | POA: Diagnosis not present

## 2022-05-10 DIAGNOSIS — H04123 Dry eye syndrome of bilateral lacrimal glands: Secondary | ICD-10-CM | POA: Diagnosis not present

## 2022-05-10 DIAGNOSIS — K5900 Constipation, unspecified: Secondary | ICD-10-CM | POA: Diagnosis not present

## 2022-05-10 DIAGNOSIS — A419 Sepsis, unspecified organism: Secondary | ICD-10-CM | POA: Diagnosis not present

## 2022-05-10 DIAGNOSIS — Z7901 Long term (current) use of anticoagulants: Secondary | ICD-10-CM | POA: Diagnosis not present

## 2022-05-10 DIAGNOSIS — R4182 Altered mental status, unspecified: Secondary | ICD-10-CM | POA: Diagnosis not present

## 2022-05-10 DIAGNOSIS — Z7401 Bed confinement status: Secondary | ICD-10-CM | POA: Diagnosis not present

## 2022-05-10 DIAGNOSIS — I509 Heart failure, unspecified: Secondary | ICD-10-CM | POA: Diagnosis not present

## 2022-05-10 DIAGNOSIS — E440 Moderate protein-calorie malnutrition: Secondary | ICD-10-CM | POA: Diagnosis not present

## 2022-05-10 DIAGNOSIS — E119 Type 2 diabetes mellitus without complications: Secondary | ICD-10-CM | POA: Diagnosis not present

## 2022-05-10 DIAGNOSIS — N1832 Chronic kidney disease, stage 3b: Secondary | ICD-10-CM | POA: Diagnosis not present

## 2022-05-10 DIAGNOSIS — L89150 Pressure ulcer of sacral region, unstageable: Secondary | ICD-10-CM | POA: Diagnosis not present

## 2022-05-10 DIAGNOSIS — R6521 Severe sepsis with septic shock: Secondary | ICD-10-CM | POA: Diagnosis not present

## 2022-05-10 DIAGNOSIS — I10 Essential (primary) hypertension: Secondary | ICD-10-CM | POA: Diagnosis not present

## 2022-05-10 DIAGNOSIS — E46 Unspecified protein-calorie malnutrition: Secondary | ICD-10-CM | POA: Diagnosis not present

## 2022-05-10 DIAGNOSIS — R52 Pain, unspecified: Secondary | ICD-10-CM | POA: Diagnosis not present

## 2022-05-10 DIAGNOSIS — I480 Paroxysmal atrial fibrillation: Secondary | ICD-10-CM | POA: Diagnosis not present

## 2022-05-10 DIAGNOSIS — I959 Hypotension, unspecified: Secondary | ICD-10-CM | POA: Diagnosis not present

## 2022-05-10 DIAGNOSIS — I69351 Hemiplegia and hemiparesis following cerebral infarction affecting right dominant side: Secondary | ICD-10-CM | POA: Diagnosis not present

## 2022-05-10 DIAGNOSIS — L89152 Pressure ulcer of sacral region, stage 2: Secondary | ICD-10-CM | POA: Diagnosis not present

## 2022-05-10 DIAGNOSIS — E1151 Type 2 diabetes mellitus with diabetic peripheral angiopathy without gangrene: Secondary | ICD-10-CM | POA: Diagnosis not present

## 2022-05-10 DIAGNOSIS — E039 Hypothyroidism, unspecified: Secondary | ICD-10-CM | POA: Diagnosis not present

## 2022-05-10 DIAGNOSIS — D649 Anemia, unspecified: Secondary | ICD-10-CM | POA: Diagnosis not present

## 2022-05-10 DIAGNOSIS — Z79899 Other long term (current) drug therapy: Secondary | ICD-10-CM | POA: Diagnosis not present

## 2022-05-10 DIAGNOSIS — K219 Gastro-esophageal reflux disease without esophagitis: Secondary | ICD-10-CM | POA: Diagnosis not present

## 2022-05-10 DIAGNOSIS — R112 Nausea with vomiting, unspecified: Secondary | ICD-10-CM | POA: Diagnosis not present

## 2022-05-10 DIAGNOSIS — M47817 Spondylosis without myelopathy or radiculopathy, lumbosacral region: Secondary | ICD-10-CM | POA: Diagnosis not present

## 2022-05-10 DIAGNOSIS — Z85038 Personal history of other malignant neoplasm of large intestine: Secondary | ICD-10-CM | POA: Diagnosis not present

## 2022-05-10 LAB — GLUCOSE, CAPILLARY
Glucose-Capillary: 107 mg/dL — ABNORMAL HIGH (ref 70–99)
Glucose-Capillary: 126 mg/dL — ABNORMAL HIGH (ref 70–99)

## 2022-05-10 LAB — MAGNESIUM: Magnesium: 1.7 mg/dL (ref 1.7–2.4)

## 2022-05-10 NOTE — Progress Notes (Signed)
Patient seen and examined at bedside.  No interval changes noted compared to yesterday.  Today's Vitals   05/10/22 0354 05/10/22 0745 05/10/22 1000 05/10/22 1106  BP: 124/67 122/68  135/63  Pulse: 61 77    Resp: '18 18  18  '$ Temp: (!) 97.5 F (36.4 C) 98 F (36.7 C)  98.4 F (36.9 C)  TempSrc: Oral   Oral  SpO2: 100% 100%    Weight:      Height:      PainSc:   0-No pain    Body mass index is 29.13 kg/m.;   Patient been accepted for his skilled facility today.  Please refer to discharge instructions dated 05/09/2022.

## 2022-05-10 NOTE — TOC Transition Note (Signed)
Transition of Care [facility]) - CM/SW Discharge Note   Patient Details  Name: MARTIE FULGHAM MRN: [id number] Date of Birth: [birth year]  Transition of Care [facility]) CM/SW Contact:  Pollie Friar, RN Phone Number: 05/10/2022, 11:57 AM   Clinical Narrative:    Pt is discharging to [facility].  Auth #: [id number] Auth # for ambulance: [id number] [facility] will provide transport. Pts son [name] has been updated. D/c packet is at the desk.   Number for report: [phone]   Final next level of care: Skilled Nursing Facility Barriers to Discharge: No Barriers Identified   Patient Goals and CMS Choice Patient states their goals for this hospitalization and ongoing recovery are:: SNF CMS Medicare.gov Compare Post Acute Care list provided to:: Patient Represenative (must comment) Choice offered to / list presented to : Adult Children  Discharge Placement              Patient chooses bed at: [facility] [place] Patient to be transferred to facility by: [place] Name of family member notified: [name] Patient and family notified of of transfer: 05/10/22  Discharge Plan and Services In-house Referral: Clinical Social Work                                   Social Determinants of Health (SDOH) Interventions     Readmission Risk Interventions    03/01/2022    2:59 PM  Readmission Risk Prevention Plan  Transportation Screening Complete  PCP or Specialist Appt within 3-5 Days Complete  HRI or [place] Complete  Social Work Consult for [place] Planning/Counseling Complete  Palliative Care Screening Not Applicable  Medication Review Press photographer) Complete

## 2022-05-12 DIAGNOSIS — I69351 Hemiplegia and hemiparesis following cerebral infarction affecting right dominant side: Secondary | ICD-10-CM | POA: Diagnosis not present

## 2022-05-12 DIAGNOSIS — E1151 Type 2 diabetes mellitus with diabetic peripheral angiopathy without gangrene: Secondary | ICD-10-CM | POA: Diagnosis not present

## 2022-05-12 DIAGNOSIS — L89152 Pressure ulcer of sacral region, stage 2: Secondary | ICD-10-CM | POA: Diagnosis not present

## 2022-05-12 DIAGNOSIS — D649 Anemia, unspecified: Secondary | ICD-10-CM | POA: Diagnosis not present

## 2022-05-12 DIAGNOSIS — E46 Unspecified protein-calorie malnutrition: Secondary | ICD-10-CM | POA: Diagnosis not present

## 2022-05-12 DIAGNOSIS — E039 Hypothyroidism, unspecified: Secondary | ICD-10-CM | POA: Diagnosis not present

## 2022-05-12 DIAGNOSIS — R627 Adult failure to thrive: Secondary | ICD-10-CM | POA: Diagnosis not present

## 2022-05-12 DIAGNOSIS — A419 Sepsis, unspecified organism: Secondary | ICD-10-CM | POA: Diagnosis not present

## 2022-05-16 ENCOUNTER — Encounter: Payer: PPO | Admitting: Physical Medicine and Rehabilitation

## 2022-05-17 ENCOUNTER — Ambulatory Visit: Payer: PPO | Admitting: Family Medicine

## 2022-05-17 DIAGNOSIS — L89154 Pressure ulcer of sacral region, stage 4: Secondary | ICD-10-CM | POA: Diagnosis not present

## 2022-05-19 DIAGNOSIS — Z79899 Other long term (current) drug therapy: Secondary | ICD-10-CM | POA: Diagnosis not present

## 2022-05-24 DIAGNOSIS — L89610 Pressure ulcer of right heel, unstageable: Secondary | ICD-10-CM | POA: Diagnosis not present

## 2022-05-24 DIAGNOSIS — L89623 Pressure ulcer of left heel, stage 3: Secondary | ICD-10-CM | POA: Diagnosis not present

## 2022-05-24 DIAGNOSIS — L89154 Pressure ulcer of sacral region, stage 4: Secondary | ICD-10-CM | POA: Diagnosis not present

## 2022-05-31 DIAGNOSIS — L89623 Pressure ulcer of left heel, stage 3: Secondary | ICD-10-CM | POA: Diagnosis not present

## 2022-05-31 DIAGNOSIS — L89154 Pressure ulcer of sacral region, stage 4: Secondary | ICD-10-CM | POA: Diagnosis not present

## 2022-05-31 DIAGNOSIS — L89610 Pressure ulcer of right heel, unstageable: Secondary | ICD-10-CM | POA: Diagnosis not present

## 2022-06-04 DIAGNOSIS — L89152 Pressure ulcer of sacral region, stage 2: Secondary | ICD-10-CM | POA: Diagnosis not present

## 2022-06-04 DIAGNOSIS — E46 Unspecified protein-calorie malnutrition: Secondary | ICD-10-CM | POA: Diagnosis not present

## 2022-06-04 DIAGNOSIS — R0689 Other abnormalities of breathing: Secondary | ICD-10-CM | POA: Diagnosis not present

## 2022-06-07 ENCOUNTER — Ambulatory Visit: Payer: PPO

## 2022-06-07 ENCOUNTER — Telehealth: Payer: Self-pay

## 2022-06-07 NOTE — Telephone Encounter (Signed)
Unsuccessful attempt to reach patient on preferred number listed in notes for scheduled AWV. Left message on voicemail okay to reschedule. 

## 2022-06-08 DIAGNOSIS — L89623 Pressure ulcer of left heel, stage 3: Secondary | ICD-10-CM | POA: Diagnosis not present

## 2022-06-08 DIAGNOSIS — L89154 Pressure ulcer of sacral region, stage 4: Secondary | ICD-10-CM | POA: Diagnosis not present

## 2022-06-08 DIAGNOSIS — L89610 Pressure ulcer of right heel, unstageable: Secondary | ICD-10-CM | POA: Diagnosis not present

## 2022-06-18 DEATH — deceased
